# Patient Record
Sex: Male | Born: 1945 | Race: White | Hispanic: No | Marital: Married | Smoking: Never smoker
Health system: Southern US, Community
[De-identification: ages and names within clinical notes are randomized; demographics above are authoritative.]

## PROBLEM LIST (undated history)

## (undated) DIAGNOSIS — J069 Acute upper respiratory infection, unspecified: Secondary | ICD-10-CM

## (undated) DIAGNOSIS — C801 Malignant (primary) neoplasm, unspecified: Secondary | ICD-10-CM

## (undated) DIAGNOSIS — C61 Malignant neoplasm of prostate: Secondary | ICD-10-CM

## (undated) DIAGNOSIS — R251 Tremor, unspecified: Secondary | ICD-10-CM

## (undated) DIAGNOSIS — F419 Anxiety disorder, unspecified: Secondary | ICD-10-CM

## (undated) DIAGNOSIS — T7840XA Allergy, unspecified, initial encounter: Secondary | ICD-10-CM

## (undated) DIAGNOSIS — M1990 Unspecified osteoarthritis, unspecified site: Secondary | ICD-10-CM

## (undated) DIAGNOSIS — E785 Hyperlipidemia, unspecified: Secondary | ICD-10-CM

## (undated) DIAGNOSIS — I10 Essential (primary) hypertension: Secondary | ICD-10-CM

## (undated) DIAGNOSIS — G709 Myoneural disorder, unspecified: Secondary | ICD-10-CM

## (undated) HISTORY — DX: Tremor, unspecified: R25.1

## (undated) HISTORY — DX: Unspecified osteoarthritis, unspecified site: M19.90

## (undated) HISTORY — DX: Hyperlipidemia, unspecified: E78.5

## (undated) HISTORY — DX: Essential (primary) hypertension: I10

## (undated) HISTORY — DX: Myoneural disorder, unspecified: G70.9

## (undated) HISTORY — DX: Malignant (primary) neoplasm, unspecified: C80.1

## (undated) HISTORY — DX: Acute upper respiratory infection, unspecified: J06.9

## (undated) HISTORY — DX: Allergy, unspecified, initial encounter: T78.40XA

## (undated) HISTORY — DX: Anxiety disorder, unspecified: F41.9

## (undated) HISTORY — PX: CATARACT EXTRACTION: SUR2

---

## 2002-07-04 ENCOUNTER — Ambulatory Visit (HOSPITAL_COMMUNITY): Admission: RE | Admit: 2002-07-04 | Discharge: 2002-07-04 | Payer: Self-pay | Admitting: Gastroenterology

## 2002-11-17 ENCOUNTER — Ambulatory Visit (HOSPITAL_COMMUNITY): Admission: RE | Admit: 2002-11-17 | Discharge: 2002-11-17 | Payer: Self-pay | Admitting: Orthopedic Surgery

## 2002-11-17 ENCOUNTER — Ambulatory Visit (HOSPITAL_BASED_OUTPATIENT_CLINIC_OR_DEPARTMENT_OTHER): Admission: RE | Admit: 2002-11-17 | Discharge: 2002-11-17 | Payer: Self-pay | Admitting: Orthopedic Surgery

## 2003-01-30 ENCOUNTER — Encounter: Admission: RE | Admit: 2003-01-30 | Discharge: 2003-01-30 | Payer: Self-pay | Admitting: Family Medicine

## 2003-02-25 HISTORY — PX: OTHER SURGICAL HISTORY: SHX169

## 2005-03-04 ENCOUNTER — Encounter: Admission: RE | Admit: 2005-03-04 | Discharge: 2005-04-07 | Payer: Self-pay | Admitting: Family Medicine

## 2008-06-27 LAB — HM COLONOSCOPY

## 2010-03-16 ENCOUNTER — Encounter: Payer: Self-pay | Admitting: Family Medicine

## 2010-11-06 ENCOUNTER — Other Ambulatory Visit (HOSPITAL_COMMUNITY): Payer: Self-pay | Admitting: Urology

## 2010-11-06 DIAGNOSIS — C61 Malignant neoplasm of prostate: Secondary | ICD-10-CM

## 2010-11-18 ENCOUNTER — Encounter (HOSPITAL_COMMUNITY)
Admission: RE | Admit: 2010-11-18 | Discharge: 2010-11-18 | Disposition: A | Discharge status: Home / Self Care | Payer: Federal, State, Local not specified - PPO | Source: Ambulatory Visit | Attending: Urology | Admitting: Urology

## 2010-11-18 ENCOUNTER — Ambulatory Visit (HOSPITAL_COMMUNITY)
Admission: RE | Admit: 2010-11-18 | Discharge: 2010-11-18 | Disposition: A | Discharge status: Home / Self Care | Payer: Federal, State, Local not specified - PPO | Source: Ambulatory Visit | Attending: Urology | Admitting: Urology

## 2010-11-18 DIAGNOSIS — C61 Malignant neoplasm of prostate: Secondary | ICD-10-CM

## 2010-11-18 MED ORDER — TECHNETIUM TC 99M MEDRONATE IV KIT
25.0000 | PACK | Freq: Once | INTRAVENOUS | Status: AC | PRN
  Administered 2010-11-18: 25 via INTRAVENOUS

## 2010-11-28 ENCOUNTER — Other Ambulatory Visit (HOSPITAL_COMMUNITY): Payer: Self-pay | Admitting: Urology

## 2010-11-28 DIAGNOSIS — C61 Malignant neoplasm of prostate: Secondary | ICD-10-CM

## 2010-12-06 ENCOUNTER — Ambulatory Visit (HOSPITAL_COMMUNITY)
Admission: RE | Admit: 2010-12-06 | Discharge: 2010-12-06 | Disposition: A | Discharge status: Home / Self Care | Payer: Federal, State, Local not specified - PPO | Source: Ambulatory Visit | Attending: Urology | Admitting: Urology

## 2010-12-06 DIAGNOSIS — C61 Malignant neoplasm of prostate: Secondary | ICD-10-CM | POA: Insufficient documentation

## 2010-12-06 DIAGNOSIS — K4090 Unilateral inguinal hernia, without obstruction or gangrene, not specified as recurrent: Secondary | ICD-10-CM | POA: Insufficient documentation

## 2010-12-06 DIAGNOSIS — R972 Elevated prostate specific antigen [PSA]: Secondary | ICD-10-CM | POA: Insufficient documentation

## 2010-12-06 LAB — CREATININE, SERUM
Creatinine, Ser: 0.75 mg/dL (ref 0.50–1.35)
GFR calc Af Amer: >90 mL/min (ref >90)
GFR calc non Af Amer: >90 mL/min (ref >90)

## 2010-12-06 MED ORDER — GADOBENATE DIMEGLUMINE 529 MG/ML IV SOLN
20.0000 mL | Freq: Once | INTRAVENOUS | Status: AC | PRN
  Administered 2010-12-06: 16 mL via INTRAVENOUS

## 2011-01-01 ENCOUNTER — Encounter (HOSPITAL_COMMUNITY): Payer: Self-pay | Admitting: Pharmacy Technician

## 2011-01-06 ENCOUNTER — Ambulatory Visit (HOSPITAL_COMMUNITY)
Admission: RE | Admit: 2011-01-06 | Discharge: 2011-01-06 | Disposition: A | Discharge status: Home / Self Care | Payer: Federal, State, Local not specified - PPO | Source: Ambulatory Visit | Attending: Urology | Admitting: Urology

## 2011-01-06 ENCOUNTER — Other Ambulatory Visit: Payer: Self-pay | Admitting: Urology

## 2011-01-06 ENCOUNTER — Other Ambulatory Visit: Payer: Self-pay

## 2011-01-06 ENCOUNTER — Encounter (HOSPITAL_COMMUNITY): Payer: Federal, State, Local not specified - PPO

## 2011-01-06 ENCOUNTER — Encounter (HOSPITAL_COMMUNITY): Payer: Self-pay

## 2011-01-06 DIAGNOSIS — Z01812 Encounter for preprocedural laboratory examination: Secondary | ICD-10-CM | POA: Insufficient documentation

## 2011-01-06 DIAGNOSIS — Z01818 Encounter for other preprocedural examination: Secondary | ICD-10-CM | POA: Insufficient documentation

## 2011-01-06 LAB — BASIC METABOLIC PANEL
BUN: 13 mg/dL (ref 6–23)
CO2: 29 mEq/L (ref 19–32)
Calcium: 9.7 mg/dL (ref 8.4–10.5)
Chloride: 104 mEq/L (ref 96–112)
Creatinine, Ser: 0.91 mg/dL (ref 0.50–1.35)
GFR calc Af Amer: >90 mL/min (ref >90)
GFR calc non Af Amer: 88 mL/min — ABNORMAL LOW (ref >90)
Glucose, Bld: 104 mg/dL — ABNORMAL HIGH (ref 70–99)
Potassium: 3.9 mEq/L (ref 3.5–5.1)
Sodium: 139 mEq/L (ref 135–145)

## 2011-01-06 LAB — CBC
HCT: 42.7 % (ref 39.0–52.0)
Hemoglobin: 14.8 g/dL (ref 13.0–17.0)
MCH: 30.6 pg (ref 26.0–34.0)
MCHC: 34.7 g/dL (ref 30.0–36.0)
MCV: 88.2 fL (ref 78.0–100.0)
Platelets: 213 10*3/uL (ref 150–400)
RBC: 4.84 MIL/uL (ref 4.22–5.81)
RDW: 12.5 % (ref 11.5–15.5)
WBC: 9.3 10*3/uL (ref 4.0–10.5)

## 2011-01-06 LAB — SURGICAL PCR SCREEN
MRSA, PCR: INVALID — AB
Staphylococcus aureus: INVALID — AB

## 2011-01-06 NOTE — Patient Instructions (Addendum)
21 [name]  01/06/2011   Your procedure is scheduled on:  01/13/11 1610-9604  Report to [facility] at 0515 AM.  Call this number if you have problems the morning of surgery: [phone]   Remember: Mechanial Bowel Prep per office    Do not eat food:After Midnight.  Do not drink clear liquids: After Midnight.  Take these medicines the morning of surgery with A SIP OF WATER:  None   Do not wear jewelry.   Do not wear lotions, powders, or perfumes.     Do not bring valuables to the hospital.  Contacts, dentures or bridgework may not be worn into surgery.  Leave suitcase in the car. After surgery it may be brought to your room.  For patients admitted to the hospital, checkout time is 11:00 AM the day of discharge.        Special Instructions: CHG Shower Use Special Wash: 1/2 bottle night before surgery and 1/2 bottle morning of surgery. Wash face and private parts with regular soap . Use CHG chin to toes   Please read over the following fact sheets that you were given: MRSA Information, blood fact sheet, incentive spirometry fact sheet

## 2011-01-06 NOTE — Pre-Procedure Instructions (Signed)
01/06/11 Lab called with invalid MRSA result . Pt notified that result will not be back until 01/08/11 .  Pt aware to hold on to script of Mupirocin  And nurse would call pt on 01/08/11 pm.

## 2011-01-09 NOTE — Pre-Procedure Instructions (Signed)
01/09/11 negative pcr results per Marcelle Smiling in lab .

## 2011-01-12 MED ORDER — CEFAZOLIN SODIUM-DEXTROSE 2-3 GM-% IV SOLR
2.0000 g | Freq: Once | INTRAVENOUS | Status: AC
  Administered 2011-01-13: 1 g via INTRAVENOUS
  Filled 2011-01-12: qty 50

## 2011-01-12 MED ORDER — CEFAZOLIN SODIUM 1-5 GM-% IV SOLN
1.0000 g | Freq: Once | INTRAVENOUS | Status: DC
  Filled 2011-01-12: qty 50

## 2011-01-13 ENCOUNTER — Inpatient Hospital Stay (HOSPITAL_COMMUNITY): Payer: Federal, State, Local not specified - PPO | Admitting: Certified Registered Nurse Anesthetist

## 2011-01-13 ENCOUNTER — Encounter (HOSPITAL_COMMUNITY): Payer: Self-pay | Admitting: Certified Registered Nurse Anesthetist

## 2011-01-13 ENCOUNTER — Encounter (HOSPITAL_COMMUNITY)
Admission: RE | Disposition: A | Discharge status: Home / Self Care | Payer: Self-pay | Source: Ambulatory Visit | Attending: Urology

## 2011-01-13 ENCOUNTER — Inpatient Hospital Stay (HOSPITAL_COMMUNITY)
Admission: RE | Admit: 2011-01-13 | Discharge: 2011-01-14 | DRG: 335 | Disposition: A | Discharge status: Home / Self Care | Payer: Federal, State, Local not specified - PPO | Source: Ambulatory Visit | Attending: Urology | Admitting: Urology

## 2011-01-13 ENCOUNTER — Other Ambulatory Visit: Payer: Self-pay | Admitting: Urology

## 2011-01-13 ENCOUNTER — Encounter (HOSPITAL_COMMUNITY): Payer: Self-pay | Admitting: *Deleted

## 2011-01-13 DIAGNOSIS — E7800 Pure hypercholesterolemia, unspecified: Secondary | ICD-10-CM | POA: Diagnosis present

## 2011-01-13 DIAGNOSIS — I10 Essential (primary) hypertension: Secondary | ICD-10-CM | POA: Diagnosis present

## 2011-01-13 DIAGNOSIS — C61 Malignant neoplasm of prostate: Secondary | ICD-10-CM

## 2011-01-13 HISTORY — PX: ROBOT ASSISTED LAPAROSCOPIC RADICAL PROSTATECTOMY: SHX5141

## 2011-01-13 LAB — BASIC METABOLIC PANEL
BUN: 12 mg/dL (ref 6–23)
CO2: 27 mEq/L (ref 19–32)
Calcium: 9.2 mg/dL (ref 8.4–10.5)
Chloride: 102 mEq/L (ref 96–112)
Creatinine, Ser: 0.99 mg/dL (ref 0.50–1.35)
GFR calc Af Amer: >90 mL/min (ref >90)
GFR calc non Af Amer: 85 mL/min — ABNORMAL LOW (ref >90)
Glucose, Bld: 140 mg/dL — ABNORMAL HIGH (ref 70–99)
Potassium: 3.6 mEq/L (ref 3.5–5.1)
Sodium: 136 mEq/L (ref 135–145)

## 2011-01-13 LAB — HEMOGLOBIN AND HEMATOCRIT, BLOOD
HCT: 42.8 % (ref 39.0–52.0)
Hemoglobin: 14.5 g/dL (ref 13.0–17.0)

## 2011-01-13 LAB — TYPE AND SCREEN
ABO/RH(D): A POS
Antibody Screen: NEGATIVE

## 2011-01-13 SURGERY — ROBOTIC ASSISTED LAPAROSCOPIC RADICAL PROSTATECTOMY LEVEL 2
Anesthesia: General | Site: Abdomen | Laterality: Bilateral | Wound class: Clean Contaminated

## 2011-01-13 MED ORDER — BISACODYL 10 MG RE SUPP: 10.0000 mg | Freq: Every day | RECTAL | Status: DC | PRN

## 2011-01-13 MED ORDER — ONDANSETRON HCL 4 MG/2ML IJ SOLN: 4.0000 mg | INTRAMUSCULAR | Status: DC | PRN

## 2011-01-13 MED ORDER — ONDANSETRON HCL 4 MG/2ML IJ SOLN
INTRAMUSCULAR | Status: DC | PRN
  Administered 2011-01-13: 4 mg via INTRAVENOUS

## 2011-01-13 MED ORDER — ACETAMINOPHEN 10 MG/ML IV SOLN
INTRAVENOUS | Status: DC | PRN
  Administered 2011-01-13: 1000 mg via INTRAVENOUS

## 2011-01-13 MED ORDER — ACETAMINOPHEN 325 MG PO TABS: 650.0000 mg | ORAL_TABLET | ORAL | Status: DC | PRN

## 2011-01-13 MED ORDER — BENAZEPRIL HCL 20 MG PO TABS
20.0000 mg | ORAL_TABLET | Freq: Every day | ORAL | Status: DC
  Administered 2011-01-14: 20 mg via ORAL
  Filled 2011-01-13 (×2): qty 1

## 2011-01-13 MED ORDER — HEPARIN SODIUM (PORCINE) 1000 UNIT/ML IJ SOLN
INTRAMUSCULAR | Status: DC | PRN
  Administered 2011-01-13: 1000 [IU] via INTRAPERITONEAL

## 2011-01-13 MED ORDER — CEFAZOLIN SODIUM 1-5 GM-% IV SOLN
1.0000 g | Freq: Three times a day (TID) | INTRAVENOUS | Status: AC
  Administered 2011-01-13 (×2): 1 g via INTRAVENOUS
  Filled 2011-01-13 (×2): qty 50

## 2011-01-13 MED ORDER — LIDOCAINE HCL (CARDIAC) 20 MG/ML IV SOLN
INTRAVENOUS | Status: DC | PRN
  Administered 2011-01-13: 80 mg via INTRAVENOUS

## 2011-01-13 MED ORDER — SODIUM CHLORIDE 0.9 % IR SOLN
Status: DC | PRN
  Administered 2011-01-13: 1000 mL

## 2011-01-13 MED ORDER — HYDROCODONE-ACETAMINOPHEN 5-325 MG PO TABS
1.0000 | ORAL_TABLET | ORAL | Status: DC | PRN
  Filled 2011-01-13: qty 2

## 2011-01-13 MED ORDER — KETOROLAC TROMETHAMINE 30 MG/ML IJ SOLN
INTRAMUSCULAR | Status: AC
  Filled 2011-01-13: qty 1

## 2011-01-13 MED ORDER — BUPIVACAINE-EPINEPHRINE 0.25% -1:200000 IJ SOLN
INTRAMUSCULAR | Status: DC | PRN
  Administered 2011-01-13: 28 mL

## 2011-01-13 MED ORDER — BELLADONNA ALKALOIDS-OPIUM 16.2-60 MG RE SUPP: 1.0000 | Freq: Four times a day (QID) | RECTAL | Status: DC | PRN

## 2011-01-13 MED ORDER — HYDROMORPHONE HCL PF 1 MG/ML IJ SOLN
0.2500 mg | INTRAMUSCULAR | Status: DC | PRN
  Administered 2011-01-13 (×4): 0.25 mg via INTRAVENOUS

## 2011-01-13 MED ORDER — DEXTROSE-NACL 5-0.45 % IV SOLN
INTRAVENOUS | Status: DC
  Administered 2011-01-13 – 2011-01-14 (×3): via INTRAVENOUS

## 2011-01-13 MED ORDER — HYDROCODONE-ACETAMINOPHEN 5-325 MG PO TABS: 1.0000 | ORAL_TABLET | ORAL | Status: AC | PRN

## 2011-01-13 MED ORDER — PROPOFOL 10 MG/ML IV EMUL
INTRAVENOUS | Status: DC | PRN
  Administered 2011-01-13: 200 mg via INTRAVENOUS

## 2011-01-13 MED ORDER — ROCURONIUM BROMIDE 100 MG/10ML IV SOLN
INTRAVENOUS | Status: DC | PRN
  Administered 2011-01-13: 10 mg via INTRAVENOUS
  Administered 2011-01-13: 50 mg via INTRAVENOUS
  Administered 2011-01-13: 10 mg via INTRAVENOUS
  Administered 2011-01-13: 5 mg via INTRAVENOUS
  Administered 2011-01-13: 10 mg via INTRAVENOUS

## 2011-01-13 MED ORDER — MENTHOL 3 MG MT LOZG: 1.0000 | LOZENGE | OROMUCOSAL | Status: DC | PRN

## 2011-01-13 MED ORDER — CIPROFLOXACIN HCL 500 MG PO TABS: 500.0000 mg | ORAL_TABLET | Freq: Two times a day (BID) | ORAL | Status: AC

## 2011-01-13 MED ORDER — HYDROCODONE-ACETAMINOPHEN 5-325 MG PO TABS: 1.0000 | ORAL_TABLET | Freq: Four times a day (QID) | ORAL | Status: DC | PRN

## 2011-01-13 MED ORDER — LACTATED RINGERS IV SOLN
INTRAVENOUS | Status: DC | PRN
  Administered 2011-01-13 (×2): via INTRAVENOUS

## 2011-01-13 MED ORDER — NEOSTIGMINE METHYLSULFATE 1 MG/ML IJ SOLN
INTRAMUSCULAR | Status: DC | PRN
  Administered 2011-01-13: 5 mg via INTRAVENOUS

## 2011-01-13 MED ORDER — SODIUM CHLORIDE 0.9 % IV BOLUS (SEPSIS)
1000.0000 mL | Freq: Once | INTRAVENOUS | Status: AC
  Administered 2011-01-13: 1000 mL via INTRAVENOUS

## 2011-01-13 MED ORDER — ZOLPIDEM TARTRATE 5 MG PO TABS
5.0000 mg | ORAL_TABLET | Freq: Every evening | ORAL | Status: DC | PRN
  Administered 2011-01-13: 5 mg via ORAL
  Filled 2011-01-13: qty 1

## 2011-01-13 MED ORDER — KETOROLAC TROMETHAMINE 30 MG/ML IJ SOLN
30.0000 mg | Freq: Four times a day (QID) | INTRAMUSCULAR | Status: DC
  Administered 2011-01-13 – 2011-01-14 (×5): 30 mg via INTRAVENOUS
  Filled 2011-01-13 (×5): qty 1

## 2011-01-13 MED ORDER — MIDAZOLAM HCL 5 MG/5ML IJ SOLN
INTRAMUSCULAR | Status: DC | PRN
  Administered 2011-01-13: 2 mg via INTRAVENOUS

## 2011-01-13 MED ORDER — BENAZEPRIL-HYDROCHLOROTHIAZIDE 20-12.5 MG PO TABS: 1.0000 | ORAL_TABLET | ORAL | Status: DC

## 2011-01-13 MED ORDER — HYDROCHLOROTHIAZIDE 12.5 MG PO CAPS
12.5000 mg | ORAL_CAPSULE | Freq: Every day | ORAL | Status: DC
  Administered 2011-01-14: 12.5 mg via ORAL
  Filled 2011-01-13 (×2): qty 1

## 2011-01-13 MED ORDER — PHENOL 1.4 % MT LIQD: 1.0000 | OROMUCOSAL | Status: DC | PRN

## 2011-01-13 MED ORDER — LABETALOL HCL 5 MG/ML IV SOLN
INTRAVENOUS | Status: DC | PRN
  Administered 2011-01-13: 5 mg via INTRAVENOUS

## 2011-01-13 MED ORDER — INDIGOTINDISULFONATE SODIUM 8 MG/ML IJ SOLN
INTRAMUSCULAR | Status: DC | PRN
  Administered 2011-01-13: 5 mL via INTRAVENOUS

## 2011-01-13 MED ORDER — MORPHINE SULFATE 2 MG/ML IJ SOLN
2.0000 mg | INTRAMUSCULAR | Status: DC | PRN
  Administered 2011-01-13 – 2011-01-14 (×4): 4 mg via INTRAVENOUS
  Filled 2011-01-13 (×4): qty 2

## 2011-01-13 MED ORDER — DEXAMETHASONE SODIUM PHOSPHATE 10 MG/ML IJ SOLN
INTRAMUSCULAR | Status: DC | PRN
  Administered 2011-01-13: 10 mg via INTRAVENOUS

## 2011-01-13 MED ORDER — GLYCOPYRROLATE 0.2 MG/ML IJ SOLN
INTRAMUSCULAR | Status: DC | PRN
  Administered 2011-01-13: .8 mg via INTRAVENOUS

## 2011-01-13 MED ORDER — FENTANYL CITRATE 0.05 MG/ML IJ SOLN
INTRAMUSCULAR | Status: DC | PRN
  Administered 2011-01-13 (×6): 50 ug via INTRAVENOUS
  Administered 2011-01-13: 100 ug via INTRAVENOUS
  Administered 2011-01-13 (×2): 50 ug via INTRAVENOUS

## 2011-01-13 MED ORDER — SIMVASTATIN 20 MG PO TABS
20.0000 mg | ORAL_TABLET | Freq: Every day | ORAL | Status: DC
  Administered 2011-01-13: 20 mg via ORAL
  Filled 2011-01-13 (×2): qty 1

## 2011-01-13 MED ORDER — HYDRALAZINE HCL 20 MG/ML IJ SOLN
INTRAMUSCULAR | Status: DC | PRN
  Administered 2011-01-13: 4 mg via INTRAVENOUS
  Administered 2011-01-13: 2 mg via INTRAVENOUS

## 2011-01-13 MED ORDER — HYDROMORPHONE HCL PF 1 MG/ML IJ SOLN
INTRAMUSCULAR | Status: AC
  Filled 2011-01-13: qty 1

## 2011-01-13 MED ORDER — STERILE WATER FOR IRRIGATION IR SOLN
Status: DC | PRN
  Administered 2011-01-13: 1500 mL

## 2011-01-13 SURGICAL SUPPLY — 38 items
CANISTER SUCTION 2500CC (MISCELLANEOUS) ×2 IMPLANT
CANNULA SEAL DVNC (CANNULA) ×1 IMPLANT
CANNULA SEALS DA VINCI (CANNULA) ×1
CATH ROBINSON RED A/P 8FR (CATHETERS) ×2 IMPLANT
CHLORAPREP W/TINT 26ML (MISCELLANEOUS) ×2 IMPLANT
CLIP LIGATING HEM O LOK PURPLE (MISCELLANEOUS) ×4 IMPLANT
CLOTH BEACON ORANGE TIMEOUT ST (SAFETY) ×2 IMPLANT
COVER SURGICAL LIGHT HANDLE (MISCELLANEOUS) ×2 IMPLANT
COVER TIP SHEARS 8 DVNC (MISCELLANEOUS) ×1 IMPLANT
COVER TIP SHEARS 8MM DA VINCI (MISCELLANEOUS) ×1
CUTTER LINEAR FLEX ECHELON 45 (STAPLE) ×2 IMPLANT
DECANTER SPIKE VIAL GLASS SM (MISCELLANEOUS) ×1 IMPLANT
DRAPE SURG IRRIG POUCH 19X23 (DRAPES) ×2 IMPLANT
DRAPE UTILITY XL STRL (DRAPES) ×2 IMPLANT
DRSG TEGADERM 6X8 (GAUZE/BANDAGES/DRESSINGS) ×4 IMPLANT
ELECT REM PT RETURN 9FT ADLT (ELECTROSURGICAL) ×2
ELECTRODE REM PT RTRN 9FT ADLT (ELECTROSURGICAL) ×1 IMPLANT
GAUZE SPONGE 2X2 8PLY STRL LF (GAUZE/BANDAGES/DRESSINGS) IMPLANT
GLOVE BIOGEL M STRL SZ7.5 (GLOVE) ×8 IMPLANT
GOWN STRL NON-REIN LRG LVL3 (GOWN DISPOSABLE) ×8 IMPLANT
HOLDER FOLEY CATH W/STRAP (MISCELLANEOUS) ×2 IMPLANT
IV LACTATED RINGERS 1000ML (IV SOLUTION) ×2 IMPLANT
KIT ACCESSORY DA VINCI DISP (KITS) ×1
KIT ACCESSORY DVNC DISP (KITS) ×1 IMPLANT
NDL SAFETY ECLIPSE 18X1.5 (NEEDLE) ×1 IMPLANT
NEEDLE HYPO 18GX1.5 SHARP (NEEDLE) ×2
PACK ROBOT UROLOGY CUSTOM (CUSTOM PROCEDURE TRAY) ×2 IMPLANT
POSITIONER SURGICAL ARM (MISCELLANEOUS) ×2 IMPLANT
RELOAD GREEN ECHELON 45 (STAPLE) ×2 IMPLANT
SET TUBE IRRIG SUCTION NO TIP (IRRIGATION / IRRIGATOR) ×2 IMPLANT
SOLUTION ELECTROLUBE (MISCELLANEOUS) ×2 IMPLANT
SPONGE GAUZE 2X2 STER 10/PKG (GAUZE/BANDAGES/DRESSINGS) ×1
SPONGE GAUZE 4X4 12PLY (GAUZE/BANDAGES/DRESSINGS) ×2 IMPLANT
SUT VICRYL 0 UR6 27IN ABS (SUTURE) ×4 IMPLANT
SYR 27GX1/2 1ML LL SAFETY (SYRINGE) ×2 IMPLANT
TAPE CLOTH SOFT 2X10 (GAUZE/BANDAGES/DRESSINGS) ×1 IMPLANT
TOWEL OR NON WOVEN STRL DISP B (DISPOSABLE) ×2 IMPLANT
WATER STERILE IRR 1500ML POUR (IV SOLUTION) ×4 IMPLANT

## 2011-01-13 NOTE — Progress Notes (Signed)
Day of Surgery Subjective: Patient reports incisional pain and tolerating PO liquids.  He is ready to ambulate.  Objective: Vital signs in last 24 hours: Temp:  [97.6 F (36.4 C)-98.5 F (36.9 C)] 97.8 F (36.6 C) (11/19 1655) Pulse Rate:  [68-97] 84  (11/19 1655) Resp:  [9-20] 14  (11/19 1655) BP: (108-188)/(59-98) 108/59 mmHg (11/19 1655) SpO2:  [95 %-100 %] 96 % (11/19 1655)    Intake/Output this shift: Total I/O In: 3300 [I.V.:2300; IV Piggyback:1000] Out: 305 [Urine:175; Drains:80; Blood:50]  Physical Exam:  General:alert, cooperative and no distress GI: soft; tender Dressings C/D/I Extremities: ted hose and SCDs in place  Lab Results:  Basename 01/13/11 1057  HGB 14.5  HCT 42.8   BMET  Basename 01/13/11 1057  NA 136  K 3.6  CL 102  CO2 27  GLUCOSE 140*  BUN 12  CREATININE 0.99  CALCIUM 9.2   Assessment/Plan: POST OP RALP Pt is doing well.  Cont routine post op care.   LOS: 0 days   Silas Flood. 01/13/2011, 5:23 PM

## 2011-01-13 NOTE — Anesthesia Postprocedure Evaluation (Signed)
  Anesthesia Post-op Note  Patient: MONTFORD BARG  Procedure(s) Performed:  ROBOTIC ASSISTED LAPAROSCOPIC RADICAL PROSTATECTOMY LEVEL 2 - Robotic Assisted Laparoscopic Prostatetectomy with Bilateral Lymphadenectomy  Level 2  Patient Location: PACU  Anesthesia Type: General  Level of Consciousness: awake and alert   Airway and Oxygen Therapy: Patient Spontanous Breathing  Post-op Pain: mild  Post-op Assessment: Post-op Vital signs reviewed, Patient's Cardiovascular Status Stable, Respiratory Function Stable, Patent Airway and No signs of Nausea or vomiting  Post-op Vital Signs: stable  Complications: No apparent anesthesia complications

## 2011-01-13 NOTE — Anesthesia Preprocedure Evaluation (Addendum)
Anesthesia Evaluation  Patient identified by MRN, date of birth, ID band Patient awake    Reviewed: Allergy & Precautions, H&P , NPO status , Patient's Chart, lab work & pertinent test results  Airway Mallampati: III TM Distance: <3 FB Neck ROM: Full    Dental  (+) Teeth Intact   Pulmonary neg pulmonary ROS,    Pulmonary exam normal       Cardiovascular hypertension, neg cardio ROS     Neuro/Psych Negative Neurological ROS  Negative Psych ROS   GI/Hepatic negative GI ROS, Neg liver ROS,   Endo/Other  Negative Endocrine ROS  Renal/GU negative Renal ROS  Genitourinary negative   Musculoskeletal negative musculoskeletal ROS (+)   Abdominal   Peds negative pediatric ROS (+)  Hematology negative hematology ROS (+)   Anesthesia Other Findings   Reproductive/Obstetrics negative OB ROS                          Anesthesia Physical Anesthesia Plan  ASA: II  Anesthesia Plan: General   Post-op Pain Management:    Induction: Intravenous  Airway Management Planned: Oral ETT  Additional Equipment:   Intra-op Plan:   Post-operative Plan: Extubation in OR  Informed Consent: I have reviewed the patients History and Physical, chart, labs and discussed the procedure including the risks, benefits and alternatives for the proposed anesthesia with the patient or authorized representative who has indicated his/her understanding and acceptance.   Dental advisory given  Plan Discussed with:   Anesthesia Plan Comments:         Anesthesia Quick Evaluation

## 2011-01-13 NOTE — H&P (Signed)
Chief Complaint  Prostate Cancer     History of Present Illness  [name] is a 65 year old who was found to have an increasing PSA up to 4.38 which prompted a referral to Dr. Mena Goes for evaluation.  He has no family history of prostate cancer and was noted to have a normal DRE.  He underwent a prostate biopsy on 10/30/10 which demostrated Gleason 4+5=9 adenocarcinoma of the prostate with 4 out of 12 biopsy cores to be positive for malignancy. Staging studies have been performed including a bone scan and CT of the abdomen/pelvis both on 11/18/10 which were negative for metastatic disease.  He also had an MRI of the prostate performed on 12/06/10 which demonstrated no evidence for EPE, SVI, or LAD. [name] is very well informed and has been counseled by Dr. Mena Goes, Dr. Annabell Howells, and Dr. Shelby Dubin.  TNM stage: cT1c N0 M0 PSA: 4.38 Gleason score: 4+5=9 Biopsy (10/30/10): 4/12 cores -- R apex (30%, 4+5=9), R lateral apex (20%, 4+5=9), R mid (<5%, 3+3=6), R lateral mid (20%, 3+3=6) Prostate volume: 20 cc  Nomogram: OC disease: 67% EPE: 30% SVI: 6% LNI: 7.3% PFS (surgery): 89%, 84%  Urinary function: IPSS - 2. Erectile function: SHIM score - 11.  He has used PDE 5 inhibitors although without significant success.   Past Medical History Problems  1. History of  Arthritis V13.4 2. History of  Esophageal Reflux 530.81 3. History of  Hypercholesterolemia 272.0 4. History of  Hypertension 401.9 5. History of  Prostate Hard Area Or Nodule 600.10  Surgical History  None   Current Meds 1. Benazepril-Hydrochlorothiazide 20-12.5 MG Oral Tablet; Therapy: 10Sep2012 to 2. Simvastatin TABS; Therapy: (Recorded:09Aug2012) to  Allergies Medication  1. No Known Drug Allergies  Family History Problems  1. Maternal history of  Cervical Cancer 2. Family history of  Death In The Family Father age 23 of heart problems 3. Family history of  Death In The Family Mother age 33 of cancer 4. Family  history of  Family Health Status Number Of Children 1 son  1 daughter 5. Paternal history of  Heart Disease V17.49 6. Maternal history of  Lung Cancer V16.1  Social History Problems    Alcohol Use 1-2 a day   Marital History - Currently Married   Occupation: Research scientist (medical) Denied    History of  Tobacco Use 305.1  Review of Systems Constitutional, skin, eye, otolaryngeal, hematologic/lymphatic, cardiovascular, pulmonary, endocrine, musculoskeletal, gastrointestinal, neurological and psychiatric system(s) were reviewed and pertinent findings if present are noted.    Vitals  BMI Calculated: 24.34 BSA Calculated: 1.95 Height: 5 ft 10 in Weight: 170 lb  Blood Pressure: 156 / 65 Heart Rate: 69  Physical Exam Constitutional: Well nourished and well developed . No acute distress.  ENT:. The ears and nose are normal in appearance.  Neck: The appearance of the neck is normal and no neck mass is present.  Pulmonary: No respiratory distress, normal respiratory rhythm and effort and clear bilateral breath sounds.  Cardiovascular: Heart rate and rhythm are normal . No peripheral edema.  Abdomen: The abdomen is soft and nontender. No masses are palpated.  Rectal: Prostate size is estimated to be 40 g. The prostate has no nodularity and is not indurated.  Lymphatics: The femoral and inguinal nodes are not enlarged or tender.  Skin: Normal skin turgor, no visible rash and no visible skin lesions.  Neuro/Psych:. Mood and affect are appropriate.    Results/Data Urine [Data Includes: Last 1  Day]  09Nov2012  COLOR: YELLOW  Reference Range YELLOW APPEARANCE: CLEAR  Reference Range CLEAR SPECIFIC GRAVITY: 1.020  Reference Range 1.005-1.030 pH: 5.0  Reference Range 5.0-8.0 GLUCOSE: NEG mg/dL Reference Range NEG BILIRUBIN: NEG  Reference Range NEG KETONE: NEG mg/dL Reference Range NEG BLOOD: NEG  Reference Range NEG PROTEIN: NEG mg/dL Reference Range NEG UROBILINOGEN: 0.2 mg/dL Reference  Range 1.6-1.0 NITRITE: NEG  Reference Range NEG LEUKOCYTE ESTERASE: NEG  Reference Range NEG   I reviewed his medical records, PSA results, pathology report, and independently reviewed his imaging studies with findings as dictated above.   Assessment Assessed  1. Prostate Cancer 185   Discussion/Summary  1. Prostate cancer: [name] does wish to proceed with surgical therapy.   The patient was counseled about the natural history of prostate cancer and the standard treatment options that are available for prostate cancer. It was explained to him how his age and life expectancy, clinical stage, Gleason score, and PSA affect his prognosis, the decision to proceed with additional staging studies, as well as how that information influences recommended treatment strategies. We discussed the roles for active surveillance, radiation therapy, surgical therapy, androgen deprivation, as well as ablative therapy options for the treatment of prostate cancer as appropriate to his individual cancer situation. We discussed the risks and benefits of these options with regard to their impact on cancer control and also in terms of potential adverse events, complications, and impact on quiality of life particularly related to urinary, bowel, and sexual function. The patient was encouraged to ask questions throughout the discussion today and all questions were answered to his stated satisfaction. In addition, the patient was provided with and/or directed to appropriate resources and literature for further education about prostate cancer and treatment options.   We discussed surgical therapy for prostate cancer including the different available surgical approaches. We discussed, in detail, the risks and expectations of surgery with regard to cancer control, urinary control, and erectile function as well as the expected postoperative recovery process. Additional risks of surgery including but not limited to bleeding,  infection, hernia formation, nerve damage, lymphocele formation, bowel/rectal injury potentially necessitating colostomy, damage to the urinary tract resulting in urine leakage, urethral stricture, and the cardiopulmonary risks such as myocardial infarction, stroke, death, venothromboembolism, etc. were explained. The risk of open surgical conversion for robotic/laparoscopic prostatectomy was also discussed.     Considering his poor preoperative erectile function and the fact he has been nonresponsive to PDE 5 inhibitors, we discussed her surgical approach and will proceed with a non-nerve sparing radical prostatectomy and bilateral pelvic lymphadenectomy in the setting of high risk disease.

## 2011-01-13 NOTE — Transfer of Care (Signed)
Immediate Anesthesia Transfer of Care Note  Patient: [name]  Procedure(s) Performed:  ROBOTIC ASSISTED LAPAROSCOPIC RADICAL PROSTATECTOMY LEVEL 2 - Robotic Assisted Laparoscopic Prostatetectomy with Bilateral Lymphadenectomy  Level 2  Patient Location: PACU  Anesthesia Type: General  Level of Consciousness: sedated, patient cooperative and responds to stimulaton  Airway & Oxygen Therapy: Patient Spontanous Breathing and Patient connected to face mask oxgen  Post-op Assessment: Report given to PACU RN and Post -op Vital signs reviewed and stable  Post vital signs: Reviewed and stable  Complications: No apparent anesthesia complications

## 2011-01-13 NOTE — Anesthesia Procedure Notes (Signed)
Performed by: Carter Kassel JOHNSON     

## 2011-01-13 NOTE — Op Note (Signed)
Preoperative diagnosis: Clinically localized adenocarcinoma of the prostate (clinical stage T1c N0 M0)  Postoperative diagnosis: Clinically localized adenocarcinoma of the prostate (clinical stage T1c N0 M0)  Procedure:  1. Robotic assisted laparoscopic radical prostatectomy (non nerve sparing) 2. Bilateral robotic assisted laparoscopic pelvic lymphadenectomy  Surgeon: Moody Bruins. M.D.  Assistant(s): Dr. Natalia Leatherwood and Pecola Leisure, [place]  Anesthesia: General  Complications: None  EBL: 50 mL  IVF:  1500 mL crystalloid  Specimens: 1. Prostate and seminal vesicles 2. Right pelvic lymph nodes 3. Left pelvic lymph nodes  Disposition of specimens: Pathology  Drains: 1. 20 Fr coude catheter 2. # 19 Blake pelvic drain  Indication: [name] is a 65 y.o. year old patient with clinically localized prostate cancer.  After a thorough review of the management options for treatment of prostate cancer, he elected to proceed with surgical therapy and the above procedure(s).  We have discussed the potential benefits and risks of the procedure, side effects of the proposed treatment, the likelihood of the patient achieving the goals of the procedure, and any potential problems that might occur during the procedure or recuperation. Informed consent has been obtained.  Description of procedure:  The patient was taken to the operating room and a general anesthetic was administered. He was given preoperative antibiotics, placed in the dorsal lithotomy position, and prepped and draped in the usual sterile fashion. Next a preoperative timeout was performed. A urethral catheter was placed into the bladder and a site was selected near the umbilicus for placement of the camera port. This was placed using a standard open Hassan technique which allowed entry into the peritoneal cavity under direct vision and without difficulty. A 12 mm port was placed and a pneumoperitoneum established.  The camera was then used to inspect the abdomen and there was no evidence of any intra-abdominal injuries or other abnormalities. The remaining abdominal ports were then placed. 8 mm robotic ports were placed in the right lower quadrant, left lower quadrant, and far left lateral abdominal wall. A 5 mm port was placed in the right upper quadrant and a 12 mm port was placed in the right lateral abdominal wall for laparoscopic assistance. All ports were placed under direct vision without difficulty. The surgical cart was then docked.   Utilizing the cautery scissors, the bladder was reflected posteriorly allowing entry into the space of Retzius and identification of the endopelvic fascia and prostate. The periprostatic fat was then removed from the prostate allowing full exposure of the endopelvic fascia. The endopelvic fascia was then incised from the apex back to the base of the prostate bilaterally and the underlying levator muscle fibers were swept laterally off the prostate thereby isolating the dorsal venous complex. The dorsal vein was then stapled and divided with a 45 mm Flex Echelon stapler. Attention then turned to the bladder neck which was divided anteriorly thereby allowing entry into the bladder and exposure of the urethral catheter. The catheter balloon was deflated and the catheter was brought into the operative field and used to retract the prostate anteriorly. The posterior bladder neck was then examined and was divided allowing further dissection between the bladder and prostate posteriorly until the vasa deferentia and seminal vessels were identified. The vasa deferentia were isolated, divided, and lifted anteriorly. The seminal vesicles were dissected down to their tips with care to control the seminal vascular arterial blood supply. These structures were then lifted anteriorly and the space between Denonvillier's fascia and the anterior rectum was  developed with a combination of sharp and blunt  dissection. This isolated the vascular pedicles of the prostate.  A wide non nerve sparing dissection was performed with Weck clips used to ligate the vascular pedicles of the prostate bilaterally. The vascular pedicles of the prostate were then divided.  The urethra was then sharply transected allowing the prostate specimen to be disarticulated. The pelvis was copiously irrigated and hemostasis was ensured. There was no evidence for rectal injury.  Attention then turned to the right pelvic sidewall. The fibrofatty tissue between the external iliac vein, confluence of the iliac vessels, hypogastric artery, and Cooper's ligament was dissected free from the pelvic sidewall with care to preserve the obturator nerve. Weck clips were used for lymphostasis and hemostasis. An identical procedure was performed on the contralateral side and the lymphatic packets were removed for permanent pathologic analysis.  Attention then turned to the urethral anastomosis. A 2-0 Vicryl slip knot was placed between Denonvillier's fascia, the posterior bladder neck, and the posterior urethra to reapproximate these structures. A double-armed 3-0 Monocryl suture was then used to perform a 360 running tension-free anastomosis between the bladder neck and urethra. A new urethral catheter was then placed into the bladder and irrigated. There were no blood clots within the bladder and the anastomosis appeared to be watertight. A #19 Blake drain was then brought through the left lateral 8 mm port site and positioned appropriately within the pelvis. It was secured to the skin with a nylon suture. The surgical cart was then undocked. The right lateral 12 mm port site was closed at the fascial level with a 0 Vicryl suture placed laparoscopically. All remaining ports were then removed under direct vision. The prostate specimen was removed intact within the Endopouch retrieval bag via the periumbilical camera port site. This fascial opening  was closed with two running 0 Vicryl sutures. 0.25% Marcaine was then injected into all port sites and all incisions were reapproximated at the skin level with staples. Sterile dressings were applied. The patient appeared to tolerate the procedure well and without complications. The patient was able to be extubated and transferred to the recovery unit in satisfactory condition.  Moody Bruins MD

## 2011-01-13 NOTE — Progress Notes (Signed)
Bowel prep completed as directed yesterday 01/12/11

## 2011-01-14 LAB — BASIC METABOLIC PANEL
BUN: 15 mg/dL (ref 6–23)
CO2: 26 mEq/L (ref 19–32)
Calcium: 8.6 mg/dL (ref 8.4–10.5)
Chloride: 101 mEq/L (ref 96–112)
Creatinine, Ser: 0.86 mg/dL (ref 0.50–1.35)
GFR calc Af Amer: >90 mL/min (ref >90)
GFR calc non Af Amer: 90 mL/min — ABNORMAL LOW (ref >90)
Glucose, Bld: 141 mg/dL — ABNORMAL HIGH (ref 70–99)
Potassium: 4.1 mEq/L (ref 3.5–5.1)
Sodium: 133 mEq/L — ABNORMAL LOW (ref 135–145)

## 2011-01-14 LAB — HEMOGLOBIN AND HEMATOCRIT, BLOOD
HCT: 36.5 % — ABNORMAL LOW (ref 39.0–52.0)
Hemoglobin: 12.6 g/dL — ABNORMAL LOW (ref 13.0–17.0)

## 2011-01-14 MED ORDER — DOCUSATE SODIUM 100 MG PO CAPS: 100.0000 mg | ORAL_CAPSULE | Freq: Two times a day (BID) | ORAL | Status: AC

## 2011-01-14 MED ORDER — HYDROCODONE-ACETAMINOPHEN 5-325 MG PO TABS
1.0000 | ORAL_TABLET | Freq: Four times a day (QID) | ORAL | Status: DC | PRN
  Administered 2011-01-14 (×2): 1 via ORAL
  Filled 2011-01-14 (×2): qty 1

## 2011-01-14 MED ORDER — BISACODYL 10 MG RE SUPP
10.0000 mg | Freq: Once | RECTAL | Status: AC
  Administered 2011-01-14: 10 mg via RECTAL
  Filled 2011-01-14: qty 1

## 2011-01-14 NOTE — Progress Notes (Signed)
Foley/leg bag teaching done and wife demonstrated ability to switch between bags Verbalizes understanding of d/c instructions, prescriptions given  Stable on discharge

## 2011-01-14 NOTE — Progress Notes (Signed)
Patient ID: [name], male   DOB: 06/22/1945, 65 y.o.   MRN: [id number] 1 Day Post-Op Subjective: The patient is doing well.  No nausea or vomiting. Pain is adequately controlled.  Objective: Vital signs in last 24 hours: Temp:  [97.6 F (36.4 C)-99 F (37.2 C)] 98 F (36.7 C) (11/20 0600) Pulse Rate:  [62-97] 62  (11/20 0600) Resp:  [9-20] 20  (11/20 0600) BP: (106-188)/(59-98) 132/66 mmHg (11/20 0600) SpO2:  [93 %-100 %] 99 % (11/20 0600) Weight:  [82.509 kg (181 lb 14.4 oz)] 181 lb 14.4 oz (82.509 kg) (11/20 0600)  Intake/Output from previous day: 11/19 0701 - 11/20 0700 In: 3420 [P.O.:120; I.V.:2300; IV Piggyback:1000] Out: 1105 [Urine:535; Emesis/NG output:250; Drains:270; Blood:50]  Urine output not completely recorded.  Also about 500cc of urine not recorded yet. Intake/Output this shift:    Physical Exam:  General: Alert and oriented. CV: RRR Lungs: Clear bilaterally. GI: Soft, Nondistended. Incisions: Dressings intact. Urine: Clear Extremities: Nontender, no erythema, no edema.  Lab Results:  Basename 01/14/11 0517 01/13/11 1057  HGB 12.6* 14.5  HCT 36.5* 42.8          Basename 01/14/11 0517 01/13/11 1057  CREATININE 0.86 0.99           Results for orders placed during the hospital encounter of 01/13/11 (from the past 24 hour(s))  BASIC METABOLIC PANEL     Status: Abnormal   Collection Time   01/13/11 10:57 AM      Component Value Range   Sodium 136  135 - 145 (mEq/L)   Potassium 3.6  3.5 - 5.1 (mEq/L)   Chloride 102  96 - 112 (mEq/L)   CO2 27  19 - 32 (mEq/L)   Glucose, Bld 140 (*) 70 - 99 (mg/dL)   BUN 12  6 - 23 (mg/dL)   Creatinine, Ser 4.09  0.50 - 1.35 (mg/dL)   Calcium 9.2  8.4 - 81.1 (mg/dL)   GFR calc non Af Amer 85 (*) >90 (mL/min)   GFR calc Af Amer >90  >90 (mL/min)  HEMOGLOBIN AND HEMATOCRIT, BLOOD     Status: Normal   Collection Time   01/13/11 10:57 AM      Component Value Range   Hemoglobin 14.5  13.0 - 17.0 (g/dL)   HCT  91.4  78.2 - 95.6 (%)  BASIC METABOLIC PANEL     Status: Abnormal   Collection Time   01/14/11  5:17 AM      Component Value Range   Sodium 133 (*) 135 - 145 (mEq/L)   Potassium 4.1  3.5 - 5.1 (mEq/L)   Chloride 101  96 - 112 (mEq/L)   CO2 26  19 - 32 (mEq/L)   Glucose, Bld 141 (*) 70 - 99 (mg/dL)   BUN 15  6 - 23 (mg/dL)   Creatinine, Ser 2.13  0.50 - 1.35 (mg/dL)   Calcium 8.6  8.4 - 08.6 (mg/dL)   GFR calc non Af Amer 90 (*) >90 (mL/min)   GFR calc Af Amer >90  >90 (mL/min)  HEMOGLOBIN AND HEMATOCRIT, BLOOD     Status: Abnormal   Collection Time   01/14/11  5:17 AM      Component Value Range   Hemoglobin 12.6 (*) 13.0 - 17.0 (g/dL)   HCT 57.8 (*) 46.9 - 52.0 (%)    Assessment/Plan: POD# 1 s/p robotic prostatectomy.  1) SL IVF 2) Ambulate, Incentive spirometry 3) Transition to oral pain medication 4) Dulcolax suppository 5) D/C  pelvic drain 6) Plan for likely discharge later today   [name]. MD   LOS: 1 day   [name],LES 01/14/2011, 7:05 AM

## 2011-01-14 NOTE — Discharge Summary (Signed)
  Date of admission: 01/13/2011  Date of discharge: 01/14/2011  Admission diagnosis: Prostate Cancer  Discharge diagnosis: Prostate Cancer  History and Physical: For full details, please see admission history and physical. Briefly, [name] is a 65 y.o. gentleman with localized prostate cancer.  After discussing management/treatment options, he elected to proceed with surgical treatment.  Hospital Course: [name] was taken to the operating room on 01/13/2011 and underwent a robotic assisted laparoscopic radical prostatectomy. He tolerated this procedure well and without complications. Postoperatively, he was able to be transferred to a regular hospital room following recovery from anesthesia.  He was able to begin ambulating the night of surgery. He remained hemodynamically stable overnight.  He had excellent urine output with appropriately minimal output from his pelvic drain and his pelvic drain was removed on POD #1.  He was transitioned to oral pain medication, tolerated a clear liquid diet, and had met all discharge criteria and was able to be discharged home later on POD#1.  Laboratory values:  Basename 01/14/11 0517 01/13/11 1057  HGB 12.6* 14.5  HCT 36.5* 42.8   Lab Results  Component Value Date   CREATININE 0.86 01/14/2011   Disposition: Home  Discharge instruction: He was instructed to be ambulatory but to refrain from heavy lifting, strenuous activity, or driving. He was instructed on urethral catheter care.  Staples will be removed at first f/u appt.  Discharge medications:   Medication List  As of 01/14/2011  9:00 AM   START taking these medications         ciprofloxacin 500 MG tablet   Commonly known as: CIPRO   Take 1 tablet (500 mg total) by mouth 2 (two) times daily. Start day prior to office visit for foley removal      HYDROcodone-acetaminophen 5-325 MG per tablet   Commonly known as: NORCO   Take 1-2 tablets by mouth every 4 (four) hours as needed for  pain.         CONTINUE taking these medications         benazepril-hydrochlorthiazide 20-12.5 MG per tablet   Commonly known as: LOTENSIN HCT      eszopiclone 3 MG Tabs   Generic drug: Eszopiclone      simvastatin 20 MG tablet   Commonly known as: ZOCOR         STOP taking these medications         ADVIL PM 200-25 MG Caps      ibuprofen 200 MG tablet      ZICAM COLD REMEDY NA          Where to get your medications    These are the prescriptions that you need to pick up.   You may get these medications from any pharmacy.         ciprofloxacin 500 MG tablet   HYDROcodone-acetaminophen 5-325 MG per tablet            Followup: He will followup in 1 week for catheter and staple removal and to discuss his surgical pathology results.

## 2011-01-15 ENCOUNTER — Encounter (HOSPITAL_COMMUNITY): Payer: Self-pay | Admitting: Urology

## 2011-06-25 DIAGNOSIS — M6281 Muscle weakness (generalized): Secondary | ICD-10-CM | POA: Diagnosis not present

## 2011-06-25 DIAGNOSIS — N393 Stress incontinence (female) (male): Secondary | ICD-10-CM | POA: Diagnosis not present

## 2011-06-25 DIAGNOSIS — R279 Unspecified lack of coordination: Secondary | ICD-10-CM | POA: Diagnosis not present

## 2011-06-25 DIAGNOSIS — C61 Malignant neoplasm of prostate: Secondary | ICD-10-CM | POA: Diagnosis not present

## 2011-07-17 DIAGNOSIS — C61 Malignant neoplasm of prostate: Secondary | ICD-10-CM | POA: Diagnosis not present

## 2011-07-29 DIAGNOSIS — R279 Unspecified lack of coordination: Secondary | ICD-10-CM | POA: Diagnosis not present

## 2011-07-29 DIAGNOSIS — N393 Stress incontinence (female) (male): Secondary | ICD-10-CM | POA: Diagnosis not present

## 2011-07-29 DIAGNOSIS — M6281 Muscle weakness (generalized): Secondary | ICD-10-CM | POA: Diagnosis not present

## 2011-08-08 DIAGNOSIS — M25559 Pain in unspecified hip: Secondary | ICD-10-CM | POA: Diagnosis not present

## 2011-09-02 ENCOUNTER — Other Ambulatory Visit: Payer: Self-pay | Admitting: Family Medicine

## 2011-09-02 DIAGNOSIS — M25551 Pain in right hip: Secondary | ICD-10-CM

## 2011-09-03 ENCOUNTER — Ambulatory Visit
Admission: RE | Admit: 2011-09-03 | Discharge: 2011-09-03 | Disposition: A | Discharge status: Home / Self Care | Payer: Medicare Other | Source: Ambulatory Visit | Attending: Family Medicine | Admitting: Family Medicine

## 2011-09-03 DIAGNOSIS — R29898 Other symptoms and signs involving the musculoskeletal system: Secondary | ICD-10-CM | POA: Diagnosis not present

## 2011-09-03 DIAGNOSIS — M25551 Pain in right hip: Secondary | ICD-10-CM

## 2011-09-04 DIAGNOSIS — M25559 Pain in unspecified hip: Secondary | ICD-10-CM | POA: Diagnosis not present

## 2011-09-10 DIAGNOSIS — R279 Unspecified lack of coordination: Secondary | ICD-10-CM | POA: Diagnosis not present

## 2011-09-10 DIAGNOSIS — N393 Stress incontinence (female) (male): Secondary | ICD-10-CM | POA: Diagnosis not present

## 2011-09-10 DIAGNOSIS — M6281 Muscle weakness (generalized): Secondary | ICD-10-CM | POA: Diagnosis not present

## 2011-09-10 DIAGNOSIS — C61 Malignant neoplasm of prostate: Secondary | ICD-10-CM | POA: Diagnosis not present

## 2011-09-11 DIAGNOSIS — M25559 Pain in unspecified hip: Secondary | ICD-10-CM | POA: Diagnosis not present

## 2011-09-11 DIAGNOSIS — M76899 Other specified enthesopathies of unspecified lower limb, excluding foot: Secondary | ICD-10-CM | POA: Diagnosis not present

## 2011-09-17 DIAGNOSIS — C61 Malignant neoplasm of prostate: Secondary | ICD-10-CM | POA: Diagnosis not present

## 2011-09-18 DIAGNOSIS — M76899 Other specified enthesopathies of unspecified lower limb, excluding foot: Secondary | ICD-10-CM | POA: Diagnosis not present

## 2011-09-18 DIAGNOSIS — M25559 Pain in unspecified hip: Secondary | ICD-10-CM | POA: Diagnosis not present

## 2011-09-25 DIAGNOSIS — M25559 Pain in unspecified hip: Secondary | ICD-10-CM | POA: Diagnosis not present

## 2011-09-25 DIAGNOSIS — M76899 Other specified enthesopathies of unspecified lower limb, excluding foot: Secondary | ICD-10-CM | POA: Diagnosis not present

## 2011-09-30 DIAGNOSIS — M25559 Pain in unspecified hip: Secondary | ICD-10-CM | POA: Diagnosis not present

## 2011-10-07 DIAGNOSIS — M76899 Other specified enthesopathies of unspecified lower limb, excluding foot: Secondary | ICD-10-CM | POA: Diagnosis not present

## 2011-10-07 DIAGNOSIS — M25559 Pain in unspecified hip: Secondary | ICD-10-CM | POA: Diagnosis not present

## 2011-10-10 DIAGNOSIS — C61 Malignant neoplasm of prostate: Secondary | ICD-10-CM | POA: Diagnosis not present

## 2011-10-10 DIAGNOSIS — I10 Essential (primary) hypertension: Secondary | ICD-10-CM | POA: Diagnosis not present

## 2011-10-10 DIAGNOSIS — Z0000 Encounter for general adult medical examination without abnormal findings: Secondary | ICD-10-CM | POA: Diagnosis not present

## 2011-10-10 DIAGNOSIS — Z1159 Encounter for screening for other viral diseases: Secondary | ICD-10-CM | POA: Diagnosis not present

## 2011-10-10 DIAGNOSIS — G479 Sleep disorder, unspecified: Secondary | ICD-10-CM | POA: Diagnosis not present

## 2011-10-10 DIAGNOSIS — Z23 Encounter for immunization: Secondary | ICD-10-CM | POA: Diagnosis not present

## 2011-10-10 DIAGNOSIS — E7800 Pure hypercholesterolemia, unspecified: Secondary | ICD-10-CM | POA: Diagnosis not present

## 2011-11-20 DIAGNOSIS — M6281 Muscle weakness (generalized): Secondary | ICD-10-CM | POA: Diagnosis not present

## 2011-11-20 DIAGNOSIS — N393 Stress incontinence (female) (male): Secondary | ICD-10-CM | POA: Diagnosis not present

## 2011-11-20 DIAGNOSIS — R279 Unspecified lack of coordination: Secondary | ICD-10-CM | POA: Diagnosis not present

## 2011-11-24 DIAGNOSIS — Z23 Encounter for immunization: Secondary | ICD-10-CM | POA: Diagnosis not present

## 2011-12-27 DIAGNOSIS — M79609 Pain in unspecified limb: Secondary | ICD-10-CM | POA: Diagnosis not present

## 2012-01-14 DIAGNOSIS — C61 Malignant neoplasm of prostate: Secondary | ICD-10-CM | POA: Diagnosis not present

## 2012-01-21 DIAGNOSIS — N529 Male erectile dysfunction, unspecified: Secondary | ICD-10-CM | POA: Diagnosis not present

## 2012-01-21 DIAGNOSIS — C61 Malignant neoplasm of prostate: Secondary | ICD-10-CM | POA: Diagnosis not present

## 2012-01-21 DIAGNOSIS — E291 Testicular hypofunction: Secondary | ICD-10-CM | POA: Diagnosis not present

## 2012-01-21 DIAGNOSIS — N393 Stress incontinence (female) (male): Secondary | ICD-10-CM | POA: Diagnosis not present

## 2012-04-13 DIAGNOSIS — E7800 Pure hypercholesterolemia, unspecified: Secondary | ICD-10-CM | POA: Diagnosis not present

## 2012-04-13 DIAGNOSIS — Z23 Encounter for immunization: Secondary | ICD-10-CM | POA: Diagnosis not present

## 2012-04-13 DIAGNOSIS — G479 Sleep disorder, unspecified: Secondary | ICD-10-CM | POA: Diagnosis not present

## 2012-04-13 DIAGNOSIS — C61 Malignant neoplasm of prostate: Secondary | ICD-10-CM | POA: Diagnosis not present

## 2012-04-13 DIAGNOSIS — I10 Essential (primary) hypertension: Secondary | ICD-10-CM | POA: Diagnosis not present

## 2012-04-13 DIAGNOSIS — Z961 Presence of intraocular lens: Secondary | ICD-10-CM | POA: Diagnosis not present

## 2012-05-28 DIAGNOSIS — C61 Malignant neoplasm of prostate: Secondary | ICD-10-CM | POA: Diagnosis not present

## 2012-06-04 DIAGNOSIS — N529 Male erectile dysfunction, unspecified: Secondary | ICD-10-CM | POA: Diagnosis not present

## 2012-06-04 DIAGNOSIS — E291 Testicular hypofunction: Secondary | ICD-10-CM | POA: Diagnosis not present

## 2012-06-04 DIAGNOSIS — N393 Stress incontinence (female) (male): Secondary | ICD-10-CM | POA: Diagnosis not present

## 2012-06-04 DIAGNOSIS — C61 Malignant neoplasm of prostate: Secondary | ICD-10-CM | POA: Diagnosis not present

## 2012-06-07 DIAGNOSIS — L821 Other seborrheic keratosis: Secondary | ICD-10-CM | POA: Diagnosis not present

## 2012-06-07 DIAGNOSIS — D1801 Hemangioma of skin and subcutaneous tissue: Secondary | ICD-10-CM | POA: Diagnosis not present

## 2012-06-07 DIAGNOSIS — Z85828 Personal history of other malignant neoplasm of skin: Secondary | ICD-10-CM | POA: Diagnosis not present

## 2012-06-07 DIAGNOSIS — D485 Neoplasm of uncertain behavior of skin: Secondary | ICD-10-CM | POA: Diagnosis not present

## 2012-06-07 DIAGNOSIS — L570 Actinic keratosis: Secondary | ICD-10-CM | POA: Diagnosis not present

## 2012-06-07 DIAGNOSIS — D0420 Carcinoma in situ of skin of unspecified ear and external auricular canal: Secondary | ICD-10-CM | POA: Diagnosis not present

## 2012-06-07 DIAGNOSIS — C44519 Basal cell carcinoma of skin of other part of trunk: Secondary | ICD-10-CM | POA: Diagnosis not present

## 2012-06-08 DIAGNOSIS — M2040 Other hammer toe(s) (acquired), unspecified foot: Secondary | ICD-10-CM | POA: Diagnosis not present

## 2012-06-23 DIAGNOSIS — M205X9 Other deformities of toe(s) (acquired), unspecified foot: Secondary | ICD-10-CM | POA: Diagnosis not present

## 2012-06-23 DIAGNOSIS — M25579 Pain in unspecified ankle and joints of unspecified foot: Secondary | ICD-10-CM | POA: Diagnosis not present

## 2012-07-09 DIAGNOSIS — W57XXXA Bitten or stung by nonvenomous insect and other nonvenomous arthropods, initial encounter: Secondary | ICD-10-CM | POA: Diagnosis not present

## 2012-07-09 DIAGNOSIS — S30860A Insect bite (nonvenomous) of lower back and pelvis, initial encounter: Secondary | ICD-10-CM | POA: Diagnosis not present

## 2012-07-14 ENCOUNTER — Other Ambulatory Visit: Payer: Self-pay | Admitting: Dermatology

## 2012-07-14 DIAGNOSIS — Z85828 Personal history of other malignant neoplasm of skin: Secondary | ICD-10-CM | POA: Diagnosis not present

## 2012-07-14 DIAGNOSIS — C44519 Basal cell carcinoma of skin of other part of trunk: Secondary | ICD-10-CM | POA: Diagnosis not present

## 2012-07-22 DIAGNOSIS — C4491 Basal cell carcinoma of skin, unspecified: Secondary | ICD-10-CM | POA: Diagnosis not present

## 2012-07-22 DIAGNOSIS — Z4800 Encounter for change or removal of nonsurgical wound dressing: Secondary | ICD-10-CM | POA: Diagnosis not present

## 2012-10-06 DIAGNOSIS — C61 Malignant neoplasm of prostate: Secondary | ICD-10-CM | POA: Diagnosis not present

## 2012-10-13 DIAGNOSIS — C61 Malignant neoplasm of prostate: Secondary | ICD-10-CM | POA: Diagnosis not present

## 2012-10-13 DIAGNOSIS — N393 Stress incontinence (female) (male): Secondary | ICD-10-CM | POA: Diagnosis not present

## 2012-10-14 DIAGNOSIS — M6530 Trigger finger, unspecified finger: Secondary | ICD-10-CM | POA: Diagnosis not present

## 2012-10-14 DIAGNOSIS — I10 Essential (primary) hypertension: Secondary | ICD-10-CM | POA: Diagnosis not present

## 2012-10-14 DIAGNOSIS — Z0000 Encounter for general adult medical examination without abnormal findings: Secondary | ICD-10-CM | POA: Diagnosis not present

## 2012-10-14 DIAGNOSIS — C61 Malignant neoplasm of prostate: Secondary | ICD-10-CM | POA: Diagnosis not present

## 2012-10-14 DIAGNOSIS — Z23 Encounter for immunization: Secondary | ICD-10-CM | POA: Diagnosis not present

## 2012-10-14 DIAGNOSIS — G479 Sleep disorder, unspecified: Secondary | ICD-10-CM | POA: Diagnosis not present

## 2012-10-14 DIAGNOSIS — E7800 Pure hypercholesterolemia, unspecified: Secondary | ICD-10-CM | POA: Diagnosis not present

## 2013-02-23 DIAGNOSIS — C61 Malignant neoplasm of prostate: Secondary | ICD-10-CM | POA: Diagnosis not present

## 2013-03-02 ENCOUNTER — Telehealth: Payer: Self-pay | Admitting: Oncology

## 2013-03-02 DIAGNOSIS — C61 Malignant neoplasm of prostate: Secondary | ICD-10-CM | POA: Diagnosis not present

## 2013-03-02 NOTE — Telephone Encounter (Signed)
S/W PT AND GVE NP APPT 01/15 @ 10:30 W/DR. [place] PACK[facility] MAILED.

## 2013-03-02 NOTE — Telephone Encounter (Signed)
C/D 03/02/12 for appt. 03/10/13

## 2013-03-04 ENCOUNTER — Other Ambulatory Visit: Payer: Self-pay | Admitting: Oncology

## 2013-03-04 DIAGNOSIS — C61 Malignant neoplasm of prostate: Secondary | ICD-10-CM

## 2013-03-10 ENCOUNTER — Telehealth: Payer: Self-pay | Admitting: Oncology

## 2013-03-10 ENCOUNTER — Encounter: Payer: Self-pay | Admitting: Oncology

## 2013-03-10 ENCOUNTER — Ambulatory Visit (HOSPITAL_BASED_OUTPATIENT_CLINIC_OR_DEPARTMENT_OTHER): Payer: Medicare Other | Admitting: Oncology

## 2013-03-10 ENCOUNTER — Other Ambulatory Visit (HOSPITAL_BASED_OUTPATIENT_CLINIC_OR_DEPARTMENT_OTHER): Payer: Medicare Other

## 2013-03-10 ENCOUNTER — Ambulatory Visit: Payer: Medicare Other

## 2013-03-10 VITALS — BP 144/65 | HR 64 | Temp 97.8°F | Resp 18 | Ht 71.0 in | Wt 186.6 lb

## 2013-03-10 DIAGNOSIS — C61 Malignant neoplasm of prostate: Secondary | ICD-10-CM

## 2013-03-10 DIAGNOSIS — E291 Testicular hypofunction: Secondary | ICD-10-CM

## 2013-03-10 LAB — CBC WITH DIFFERENTIAL/PLATELET
BASO%: 1.2 % (ref 0.0–2.0)
Basophils Absolute: 0.1 10*3/uL (ref 0.0–0.1)
EOS%: 2 % (ref 0.0–7.0)
Eosinophils Absolute: 0.1 10*3/uL (ref 0.0–0.5)
HCT: 40.6 % (ref 38.4–49.9)
HGB: 13.6 g/dL (ref 13.0–17.1)
LYMPH%: 31.9 % (ref 14.0–49.0)
MCH: 29.9 pg (ref 27.2–33.4)
MCHC: 33.5 g/dL (ref 32.0–36.0)
MCV: 89.4 fL (ref 79.3–98.0)
MONO#: 0.5 10*3/uL (ref 0.1–0.9)
MONO%: 8.7 % (ref 0.0–14.0)
NEUT#: 3.4 10*3/uL (ref 1.5–6.5)
NEUT%: 56.2 % (ref 39.0–75.0)
Platelets: 201 10*3/uL (ref 140–400)
RBC: 4.55 10*6/uL (ref 4.20–5.82)
RDW: 12.9 % (ref 11.0–14.6)
WBC: 6.1 10*3/uL (ref 4.0–10.3)
lymph#: 2 10*3/uL (ref 0.9–3.3)

## 2013-03-10 LAB — COMPREHENSIVE METABOLIC PANEL (CC13)
ALT: 18 U/L (ref 0–55)
AST: 23 U/L (ref 5–34)
Albumin: 4 g/dL (ref 3.5–5.0)
Alkaline Phosphatase: 58 U/L (ref 40–150)
Anion Gap: 8 mEq/L (ref 3–11)
BUN: 21.5 mg/dL (ref 7.0–26.0)
CO2: 28 mEq/L (ref 22–29)
Calcium: 9.8 mg/dL (ref 8.4–10.4)
Chloride: 103 mEq/L (ref 98–109)
Creatinine: 0.9 mg/dL (ref 0.7–1.3)
Glucose: 111 mg/dl (ref 70–140)
Potassium: 4 mEq/L (ref 3.5–5.1)
Sodium: 140 mEq/L (ref 136–145)
Total Bilirubin: 0.52 mg/dL (ref 0.20–1.20)
Total Protein: 7 g/dL (ref 6.4–8.3)

## 2013-03-10 LAB — TESTOSTERONE: Testosterone: 48 ng/dL — ABNORMAL LOW (ref 300–890)

## 2013-03-10 LAB — PSA: PSA: 0.01 ng/mL (ref <=4.00)

## 2013-03-10 NOTE — Progress Notes (Signed)
Checked in new pt with no financial concerns. °

## 2013-03-10 NOTE — Consult Note (Signed)
Reason for Referral: Prostate cancer.  HPI: 68 year old gentleman currently of [place] diagnosed with prostate cancer dating back to 2012. He presented with a PSA of 4.38 and subsequently underwent a radical prostatectomy and lymph node dissection on November of 2012. 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 He did not have any evidence of angiolymphatic invasion or extraprostatic extension. The margins were negative. He had one out of 12 lymph nodes involved with cancer predominantly in the right pelvic wall. His post operative PSA remained at 0.65 and subsequently to 0.89. And in March of 2013 he was started on Lupron and have been on it since that time. His PSA remains very low  0.01 most recently on 02/23/2013. He was referred to me for the evaluation and management of his prostate cancer moving forward. Clinically, he is completely asymptomatic. He has not reported any weight loss or appetite changes. Did not report any constitutional symptoms. Does not report any abdominal pain or satiety. Did not report any back pain shoulder pain or any bone discomfort. Has not reported any hematochezia or melena. Has not reported any hematuria. He does report incontinence and erectile dysfunction. He continued to perform activities of daily living without any hindrance or decline.   Past Medical History  Diagnosis Date  . Hypertension   . Recurrent upper respiratory infection (URI)     pt had a cold recent- week ago - better now   . Cancer     prostate cancer   . Arthritis     hands   :  Past Surgical History  Procedure Laterality Date  . Other surgical history  2005    right hand middle finger surgery   . Robot assisted laparoscopic radical prostatectomy  01/13/2011    Procedure: ROBOTIC ASSISTED LAPAROSCOPIC RADICAL PROSTATECTOMY LEVEL 2;  Surgeon: Dutch Gray, MD;  Location: [facility];  Service: Urology;  Laterality:  Bilateral;  Robotic Assisted Laparoscopic Prostatetectomy with Bilateral Lymphadenectomy  Level 2  :  Current Outpatient Prescriptions  Medication Sig Dispense Refill  . ascorbic acid (VITAMIN C) 1000 MG tablet Take 1,000 mg by mouth 3 (three) times a week.      . benazepril-hydrochlorthiazide (LOTENSIN HCT) 20-12.5 MG per tablet Take 1 tablet by mouth every morning.        . Calcium Carbonate-Vitamin D (CALCIUM 600+D3 PO) Take 1 tablet by mouth daily. Patient takes 1-2 tablets daily      . diphenhydrAMINE (BENADRYL) 25 MG tablet Take 25 mg by mouth every 8 (eight) hours as needed. For allergies      . Eszopiclone (ESZOPICLONE) 3 MG TABS Take 3 mg by mouth at bedtime as needed. Take immediately before bedtime. Sleep       . Ibuprofen-Diphenhydramine Cit (ADVIL PM PO) Take 1 tablet by mouth at bedtime as needed.      . MULTIPLE VITAMIN PO Take 1 tablet by mouth daily.      . simvastatin (ZOCOR) 20 MG tablet Take 20 mg by mouth at bedtime.       . traZODone (DESYREL) 50 MG tablet Take 50 mg by mouth at bedtime as needed for sleep.       No current facility-administered medications for this visit.       No Known Allergies:  No family history on file.:  History   Social History  . Marital Status: Married    Spouse Name: N/A    Number of Children: N/A  . Years  of Education: N/A   Occupational History  . Not on file.   Social History Main Topics  . Smoking status: Never Smoker   . Smokeless tobacco: Never Used  . Alcohol Use: 2.4 oz/week    2 Glasses of wine, 2 Cans of beer per week  . Drug Use: No  . Sexual Activity:    Other Topics Concern  . Not on file   Social History Narrative  . No narrative on file  :  Constitutional: negative for anorexia, chills and fatigue Eyes: negative for icterus, irritation and redness Ears, nose, mouth, throat, and face: negative for hearing loss, hoarseness, snoring and sore throat Respiratory: negative for asthma, dyspnea on exertion  and hemoptysis Cardiovascular: negative for chest pain, dyspnea and lower extremity edema Gastrointestinal: negative for abdominal pain, constipation and diarrhea Genitourinary:negative for dysuria, hematuria and hesitancy Integument/breast: negative for rash and skin color change Hematologic/lymphatic: negative for bleeding and lymphadenopathy Musculoskeletal:negative for back pain and bone pain Neurological: negative for dizziness, gait problems and headaches Behavioral/Psych: negative for depression and fatigue Endocrine: negative for temperature intolerance Allergic/Immunologic: negative for angioedema and urticaria  Exam: Blood pressure 144/65, pulse 64, temperature 97.8 F (36.6 C), temperature source Oral, resp. rate 18, height 5\' 11"  (1.803 m), weight 186 lb 9.6 oz (84.641 kg). General appearance: alert, cooperative and appears stated age Head: Normocephalic, without obvious abnormality, atraumatic Nose: Nares normal. Septum midline. Mucosa normal. No drainage or sinus tenderness. Throat: lips, mucosa, and tongue normal; teeth and gums normal Neck: no adenopathy, no carotid bruit, no JVD, supple, symmetrical, trachea midline and thyroid not enlarged, symmetric, no tenderness/mass/nodules Back: symmetric, no curvature. ROM normal. No CVA tenderness. Resp: clear to auscultation bilaterally Chest wall: no tenderness Cardio: regular rate and rhythm, S1, S2 normal, no murmur, click, rub or gallop GI: soft, non-tender; bowel sounds normal; no masses,  no organomegaly Extremities: extremities normal, atraumatic, no cyanosis or edema Pulses: 2+ and symmetric Skin: Skin color, texture, turgor normal. No rashes or lesions Lymph nodes: Cervical, supraclavicular, and axillary nodes normal. Neurologic: Grossly normal   Recent Labs  03/10/13 1044  WBC 6.1  HGB 13.6  HCT 40.6  PLT 201    Recent Labs  03/10/13 1044  NA 140  K 4.0  CO2 28  GLUCOSE 111  BUN 21.5  CREATININE 0.9   CALCIUM 9.8     Assessment and Plan:   68 year old gentleman with the following issues:  1. Prostate cancer diagnosed in November of 2012 presented with a PSA of 4.38 and found to have a Gleason score 4+3 equals 7, his clinical staging was T2c N1. He is status post robotic prostatectomy and lymphadenectomy with one of 12 lymph nodes were involved. He continued to have elevated PSA that is detectable around 0.68 and 0.84 and had been on Lupron since March of 2013. His PSA has been undetectable since then. His pathology revealed adenocarcinoma with neuroendocrine features. I had a lengthy discussion discussing the natural course of prostate cancer more specifically advanced disease with lymph node involvement and neuroendocrine differentiation aspect of his cancer. At this point, he is certainly on the appropriate treatment and I do not see any need for any additional treatment. But he is certainly at high risk of relapse not only because of his lymph node involvement but also because of the poor differentiation of his neuroendocrine component. He is at risk of developing advanced disease without PSA been elevated which necessitates imaging study to be done on an intermittent basis.  I've explained to him that if he does develop advanced disease from and neuroendocrine component it would be hormone insensitive will likely require systemic chemotherapy. For management standpoint, I recommended he continues on Lupron as he is doing right now I will repeat imaging studies the chest abdomen and pelvis in about 6 months and a followup at that time.  2. Androgen depravation: He is to continue Lupron with Dr. Alinda Money has prescribed.  3. Bone health: I agree with calcium and vitamin D and repeat bone density frequently.  4. Smoking cessation: He has no tobacco use at this time.

## 2013-03-10 NOTE — Telephone Encounter (Signed)
gv and printed appt sched and avs for pt for July....gv pt barium °

## 2013-03-10 NOTE — Progress Notes (Signed)
Please see consult note.  

## 2013-05-20 DIAGNOSIS — G479 Sleep disorder, unspecified: Secondary | ICD-10-CM | POA: Diagnosis not present

## 2013-05-20 DIAGNOSIS — E7800 Pure hypercholesterolemia, unspecified: Secondary | ICD-10-CM | POA: Diagnosis not present

## 2013-05-20 DIAGNOSIS — C61 Malignant neoplasm of prostate: Secondary | ICD-10-CM | POA: Diagnosis not present

## 2013-05-20 DIAGNOSIS — I10 Essential (primary) hypertension: Secondary | ICD-10-CM | POA: Diagnosis not present

## 2013-06-23 DIAGNOSIS — D219 Benign neoplasm of connective and other soft tissue, unspecified: Secondary | ICD-10-CM | POA: Diagnosis not present

## 2013-06-23 DIAGNOSIS — L570 Actinic keratosis: Secondary | ICD-10-CM | POA: Diagnosis not present

## 2013-06-23 DIAGNOSIS — L820 Inflamed seborrheic keratosis: Secondary | ICD-10-CM | POA: Diagnosis not present

## 2013-06-23 DIAGNOSIS — Z85828 Personal history of other malignant neoplasm of skin: Secondary | ICD-10-CM | POA: Diagnosis not present

## 2013-06-23 DIAGNOSIS — D239 Other benign neoplasm of skin, unspecified: Secondary | ICD-10-CM | POA: Diagnosis not present

## 2013-06-23 DIAGNOSIS — L821 Other seborrheic keratosis: Secondary | ICD-10-CM | POA: Diagnosis not present

## 2013-06-24 DIAGNOSIS — C61 Malignant neoplasm of prostate: Secondary | ICD-10-CM | POA: Diagnosis not present

## 2013-06-30 DIAGNOSIS — C61 Malignant neoplasm of prostate: Secondary | ICD-10-CM | POA: Diagnosis not present

## 2013-06-30 DIAGNOSIS — E291 Testicular hypofunction: Secondary | ICD-10-CM | POA: Diagnosis not present

## 2013-09-05 ENCOUNTER — Ambulatory Visit (HOSPITAL_COMMUNITY)
Admission: RE | Admit: 2013-09-05 | Discharge: 2013-09-05 | Disposition: A | Discharge status: Home / Self Care | Payer: Medicare Other | Source: Ambulatory Visit | Attending: Oncology | Admitting: Oncology

## 2013-09-05 ENCOUNTER — Encounter (HOSPITAL_COMMUNITY): Payer: Self-pay

## 2013-09-05 ENCOUNTER — Other Ambulatory Visit (HOSPITAL_BASED_OUTPATIENT_CLINIC_OR_DEPARTMENT_OTHER): Payer: Medicare Other

## 2013-09-05 DIAGNOSIS — N329 Bladder disorder, unspecified: Secondary | ICD-10-CM | POA: Insufficient documentation

## 2013-09-05 DIAGNOSIS — R918 Other nonspecific abnormal finding of lung field: Secondary | ICD-10-CM | POA: Diagnosis not present

## 2013-09-05 DIAGNOSIS — Z9079 Acquired absence of other genital organ(s): Secondary | ICD-10-CM | POA: Diagnosis not present

## 2013-09-05 DIAGNOSIS — C61 Malignant neoplasm of prostate: Secondary | ICD-10-CM | POA: Insufficient documentation

## 2013-09-05 DIAGNOSIS — K7689 Other specified diseases of liver: Secondary | ICD-10-CM | POA: Insufficient documentation

## 2013-09-05 DIAGNOSIS — Z8546 Personal history of malignant neoplasm of prostate: Secondary | ICD-10-CM | POA: Diagnosis not present

## 2013-09-05 DIAGNOSIS — I700 Atherosclerosis of aorta: Secondary | ICD-10-CM | POA: Insufficient documentation

## 2013-09-05 LAB — COMPREHENSIVE METABOLIC PANEL (CC13)
ALT: 19 U/L (ref 0–55)
AST: 21 U/L (ref 5–34)
Albumin: 3.9 g/dL (ref 3.5–5.0)
Alkaline Phosphatase: 49 U/L (ref 40–150)
Anion Gap: 11 mEq/L (ref 3–11)
BUN: 19 mg/dL (ref 7.0–26.0)
CO2: 24 mEq/L (ref 22–29)
Calcium: 9.9 mg/dL (ref 8.4–10.4)
Chloride: 106 mEq/L (ref 98–109)
Creatinine: 0.9 mg/dL (ref 0.7–1.3)
Glucose: 102 mg/dl (ref 70–140)
Potassium: 4 mEq/L (ref 3.5–5.1)
Sodium: 141 mEq/L (ref 136–145)
Total Bilirubin: 0.61 mg/dL (ref 0.20–1.20)
Total Protein: 6.8 g/dL (ref 6.4–8.3)

## 2013-09-05 LAB — CBC WITH DIFFERENTIAL/PLATELET
BASO%: 1.3 % (ref 0.0–2.0)
Basophils Absolute: 0.1 10*3/uL (ref 0.0–0.1)
EOS%: 2.5 % (ref 0.0–7.0)
Eosinophils Absolute: 0.1 10*3/uL (ref 0.0–0.5)
HCT: 42.9 % (ref 38.4–49.9)
HGB: 14.2 g/dL (ref 13.0–17.1)
LYMPH%: 33.4 % (ref 14.0–49.0)
MCH: 29.7 pg (ref 27.2–33.4)
MCHC: 33 g/dL (ref 32.0–36.0)
MCV: 89.8 fL (ref 79.3–98.0)
MONO#: 0.5 10*3/uL (ref 0.1–0.9)
MONO%: 9.1 % (ref 0.0–14.0)
NEUT#: 3 10*3/uL (ref 1.5–6.5)
NEUT%: 53.7 % (ref 39.0–75.0)
Platelets: 189 10*3/uL (ref 140–400)
RBC: 4.77 10*6/uL (ref 4.20–5.82)
RDW: 13.2 % (ref 11.0–14.6)
WBC: 5.7 10*3/uL (ref 4.0–10.3)
lymph#: 1.9 10*3/uL (ref 0.9–3.3)

## 2013-09-05 MED ORDER — IOHEXOL 300 MG/ML  SOLN
100.0000 mL | Freq: Once | INTRAMUSCULAR | Status: AC | PRN
  Administered 2013-09-05: 100 mL via INTRAVENOUS

## 2013-09-06 LAB — PSA: PSA: 0.04 ng/mL (ref <=4.00)

## 2013-09-08 ENCOUNTER — Ambulatory Visit (HOSPITAL_BASED_OUTPATIENT_CLINIC_OR_DEPARTMENT_OTHER): Payer: Medicare Other | Admitting: Oncology

## 2013-09-08 ENCOUNTER — Encounter: Payer: Self-pay | Admitting: Oncology

## 2013-09-08 ENCOUNTER — Telehealth: Payer: Self-pay | Admitting: Oncology

## 2013-09-08 VITALS — BP 138/62 | HR 67 | Temp 97.7°F | Wt 187.0 lb

## 2013-09-08 DIAGNOSIS — C61 Malignant neoplasm of prostate: Secondary | ICD-10-CM

## 2013-09-08 NOTE — Progress Notes (Signed)
Hematology and Oncology Follow Up Visit  [name] [id number] 11-04-1945 68 y.o. 09/08/2013 9:48 AM   Principle Diagnosis: 68 year old gentleman diagnosed with prostate cancer in 2012. He presented with a PSA of 4.38 and a Gleason score 4+3 equals 7. The final pathology showed T2 N1 disease with neuroendocrine features.   Prior Therapy: He underwent a radical prostatectomy and lymph node dissection on November of 2012. 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 He had one out of 12 lymph nodes involved with cancer predominantly in the right pelvic wall. His post operative PSA remained at 0.65 and subsequently to 0.89.  And in March of 2013 he was started on Lupron and have been on it since that time. His PSA remains very low 0.01 most recently on 02/23/2013.  Current therapy: Lupron given every 4 months.  Interim History:  [name] presents today for a followup visit. He is a pleasant gentleman with the above issue returns today with his wife. Since the last visit, he has no new complaints. He continued to tolerate Lupron with very little side effects. He does report some occasional hot flashes but they have been manageable. Has not reported any urinary symptoms at this time. He continues to have excellent performance status without any decline. He does not report any headaches or blurry vision. Does not report any syncope or seizures. He is not reporting any chest pain shortness of breath or cough. Does not report any hemoptysis or hematemesis. Is a report any nausea or vomiting or abdominal pain. Does not report any hematuria or dysuria. He has no skeletal complaints. Rest or view of system is unremarkable.  Medications: I have reviewed the patient's current medications.  Current Outpatient Prescriptions  Medication Sig Dispense Refill  . ascorbic acid (VITAMIN C) 1000 MG tablet Take 1,000 mg by mouth 3 (three) times  a week.      . benazepril-hydrochlorthiazide (LOTENSIN HCT) 20-12.5 MG per tablet Take 1 tablet by mouth every morning.        . Calcium Carbonate-Vitamin D (CALCIUM 600+D3 PO) Take 1 tablet by mouth daily. Patient takes 1-2 tablets daily      . diphenhydrAMINE (BENADRYL) 25 MG tablet Take 25 mg by mouth every 8 (eight) hours as needed. For allergies      . Eszopiclone (ESZOPICLONE) 3 MG TABS Take 3 mg by mouth at bedtime as needed. Take immediately before bedtime. Sleep       . Ibuprofen-Diphenhydramine Cit (ADVIL PM PO) Take 1 tablet by mouth at bedtime as needed.      . MULTIPLE VITAMIN PO Take 1 tablet by mouth daily.      . simvastatin (ZOCOR) 20 MG tablet Take 20 mg by mouth at bedtime.       . traZODone (DESYREL) 50 MG tablet Take 50 mg by mouth at bedtime as needed for sleep.       No current facility-administered medications for this visit.     Allergies: No Known Allergies  Past Medical History, Surgical history, Social history, and Family History were reviewed and updated.   Physical Exam: Blood pressure 138/62, pulse 67, temperature 97.7 F (36.5 C), temperature source Oral, weight 187 lb (84.823 kg). ECOG:  General appearance: alert and cooperative Head: Normocephalic, without obvious abnormality Neck: no adenopathy Lymph nodes: Cervical, supraclavicular, and axillary nodes normal. Heart:regular rate and rhythm, S1, S2 normal, no murmur, click, rub or gallop Lung:chest clear, no wheezing, rales, normal symmetric  air entry Abdomin: soft, non-tender, without masses or organomegaly EXT:no erythema, induration, or nodules   Lab Results: Lab Results  Component Value Date   WBC 5.7 09/05/2013   HGB 14.2 09/05/2013   HCT 42.9 09/05/2013   MCV 89.8 09/05/2013   PLT 189 09/05/2013     Chemistry      Component Value Date/Time   NA 141 09/05/2013 0808   NA 133* 01/14/2011 0517   K 4.0 09/05/2013 0808   K 4.1 01/14/2011 0517   CL 101 01/14/2011 0517   CO2 24 09/05/2013  0808   CO2 26 01/14/2011 0517   BUN 19.0 09/05/2013 0808   BUN 15 01/14/2011 0517   CREATININE 0.9 09/05/2013 0808   CREATININE 0.86 01/14/2011 0517      Component Value Date/Time   CALCIUM 9.9 09/05/2013 0808   CALCIUM 8.6 01/14/2011 0517   ALKPHOS 49 09/05/2013 0808   AST 21 09/05/2013 0808   ALT 19 09/05/2013 0808   BILITOT 0.61 09/05/2013 0808     Results for Sensing, Semaje L (MRN [id number]) as of 09/08/2013 09:44  Ref. Range 03/10/2013 10:45 09/05/2013 08:08  PSA Latest Range: <=4.00 ng/mL 0.01 0.04    Radiological Studies:  EXAM:  CT CHEST, ABDOMEN, AND PELVIS WITH CONTRAST  TECHNIQUE:  Multidetector CT imaging of the chest, abdomen and pelvis was  performed following the standard protocol during bolus  administration of intravenous contrast.  CONTRAST: 143mL OMNIPAQUE IOHEXOL 300 MG/ML SOLN  COMPARISON: Abdominal pelvic CT 11/18/2010. Pelvic MRI 12/06/2010.  FINDINGS:  CT CHEST FINDINGS  There are no enlarged mediastinal, hilar or axillary lymph nodes.  The heart size is normal. There is mild atherosclerosis of the  aorta, great vessels and coronary arteries. There is borderline  dilatation of the ascending aorta to 4.0 cm.  There is no pleural or pericardial effusion. There are few scattered  nodules in the right lung which are predominantly subpleural and all  less than 4 mm in diameter. No suspicious nodules are demonstrated.  There are no worrisome osseous findings. There is a probable small  bone island within the right humeral head.  CT ABDOMEN AND PELVIS FINDINGS  The liver demonstrates steatosis, but no focal lesion. There is no  biliary dilatation. The gallbladder, pancreas and spleen appear  normal.  There is no adrenal mass. There are stable low-density renal lesions  bilaterally. There is no hydronephrosis.  There is stable mild atherosclerosis of the aorta, its branches and  iliac arteries. No pathologically enlarged abdominal pelvic lymph  nodes are  identified. The stomach, small bowel and colon appear  normal.  There are postsurgical changes status post interval prostatectomy.  The bladder neck is low lying. Focal high density within the bladder  neck (axial image 128) may represent postsurgical change (especially  suggested on the sagittal images). A bladder calculus is difficult  to exclude. There is stable asymmetric fat within the left inguinal  canal.  There are no worrisome osseous findings. Bone island in the left  iliac bone (image 102) is unchanged.  IMPRESSION:  1. No findings suspicious for metastatic prostate cancer.  2. Nonspecific small right lung nodules, likely postinflammatory  and/or lymph nodes. Stability can be addressed on follow-up imaging  as warranted clinically.  3. Focal high density within the bladder neck may be postsurgical,  although a calculus cannot be completely excluded.   Impression and Plan:  68 year old gentleman with the following issues:   1. Prostate cancer diagnosed in November of 2012  presented with a PSA of 4.38 and found to have a Gleason score 4+3 equals 7, his clinical staging was T2c N1. He did have a mild rise in his PSA and currently on Lupron. His PSA from 09/05/2013 as well as a CT scan were reviewed and showed no evidence of any disease. I recommend continuing Lupron under the care of Dr. Alinda Money at this time and periodically repeat his imaging studies. I will set him up with a scan in March of 2016. Certainly this can be given sooner if he develops any symptoms arise in his PSA. Given the neuroendocrine features of his cancer, is very possible that he could develop metastatic disease with low PSA.  2. Bone health: I agree with calcium and vitamin D and repeat bone density frequently.   [facility], MD 7/16/20159:48 AM

## 2013-09-08 NOTE — Telephone Encounter (Signed)
gva dnprinted appt sched and avs for pt for March 2016....gv pt barium

## 2013-09-13 DIAGNOSIS — Z961 Presence of intraocular lens: Secondary | ICD-10-CM | POA: Diagnosis not present

## 2013-10-20 DIAGNOSIS — Z1211 Encounter for screening for malignant neoplasm of colon: Secondary | ICD-10-CM | POA: Diagnosis not present

## 2013-10-20 DIAGNOSIS — C61 Malignant neoplasm of prostate: Secondary | ICD-10-CM | POA: Diagnosis not present

## 2013-10-20 DIAGNOSIS — Z1331 Encounter for screening for depression: Secondary | ICD-10-CM | POA: Diagnosis not present

## 2013-10-20 DIAGNOSIS — Z0000 Encounter for general adult medical examination without abnormal findings: Secondary | ICD-10-CM | POA: Diagnosis not present

## 2013-10-20 DIAGNOSIS — Z23 Encounter for immunization: Secondary | ICD-10-CM | POA: Diagnosis not present

## 2013-10-20 DIAGNOSIS — G479 Sleep disorder, unspecified: Secondary | ICD-10-CM | POA: Diagnosis not present

## 2013-10-20 DIAGNOSIS — E7800 Pure hypercholesterolemia, unspecified: Secondary | ICD-10-CM | POA: Diagnosis not present

## 2013-10-20 DIAGNOSIS — I10 Essential (primary) hypertension: Secondary | ICD-10-CM | POA: Diagnosis not present

## 2013-10-21 IMAGING — US US EXTREM LOW*R* LIMITED
1 series · 6 of 6 positions shown · non-contrast
Comparison: None.

CLINICAL DATA: Palpable area overlying the lateral right hip

ULTRASOUND RIGHT LOWER EXTREMITY COMPLETE
TECHNIQUE: Ultrasound examination of the hipwas performed
including evaluation of the muscles, tendons, joint, and adjacent
soft tissues.

[Series 1: us extrem low*right* limited · 0.08mm/px · 6 of 6 slices shown]
[im 1/6]
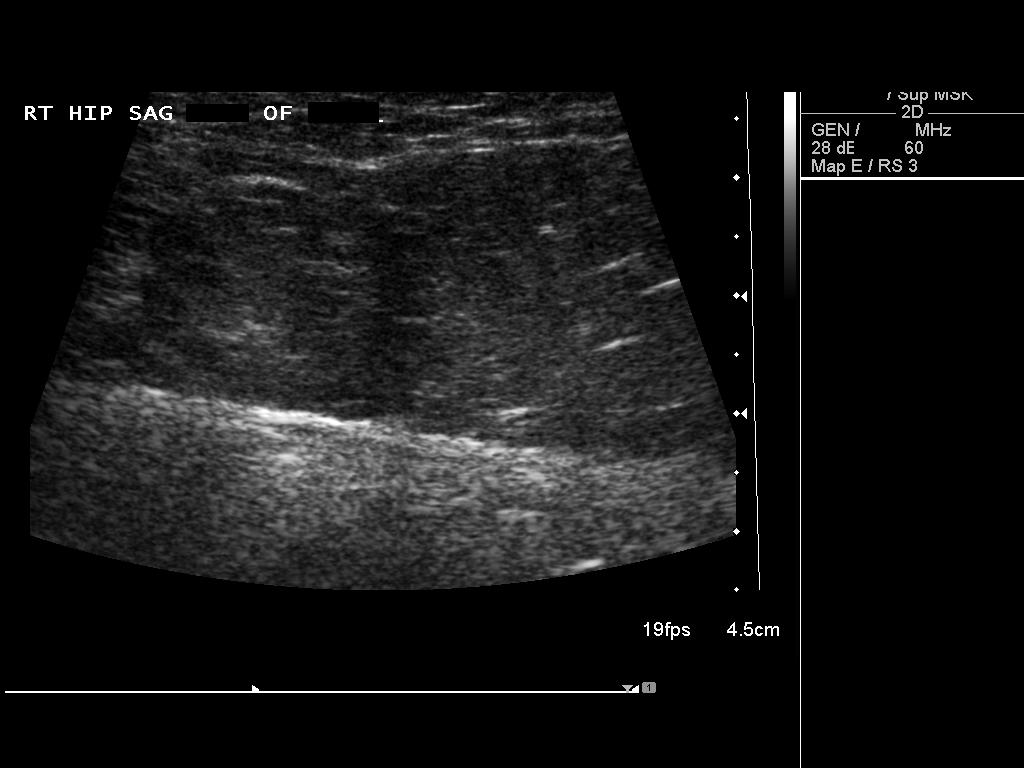
[im 2/6]
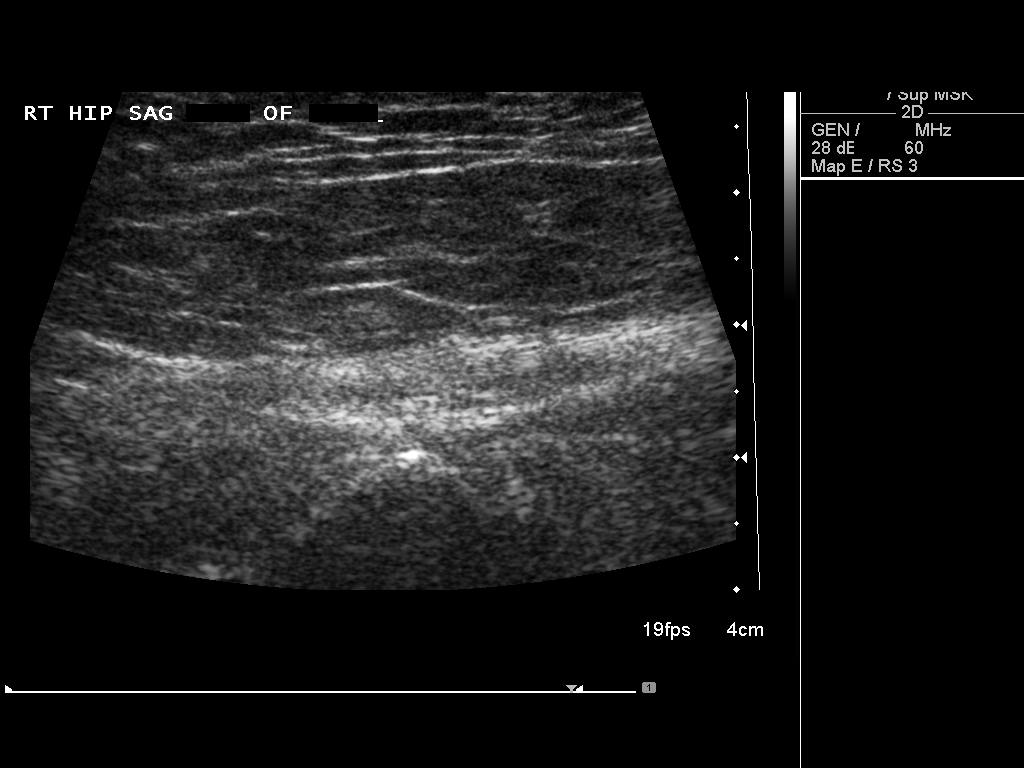
[im 3/6]
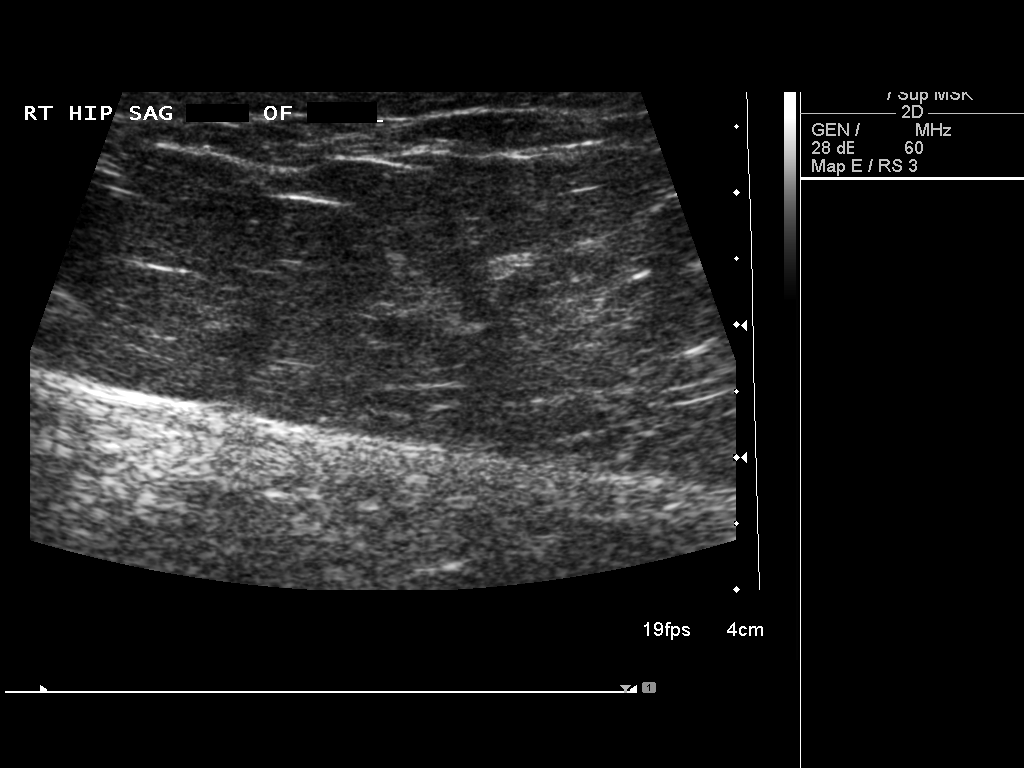
[im 4/6]
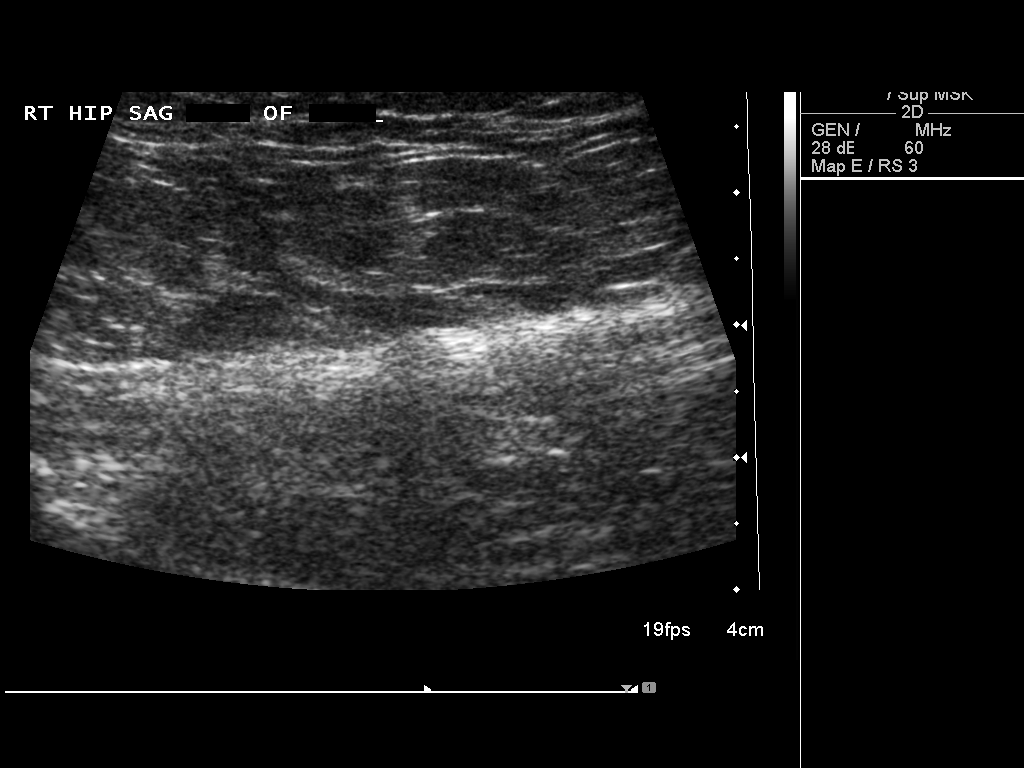
[im 5/6]
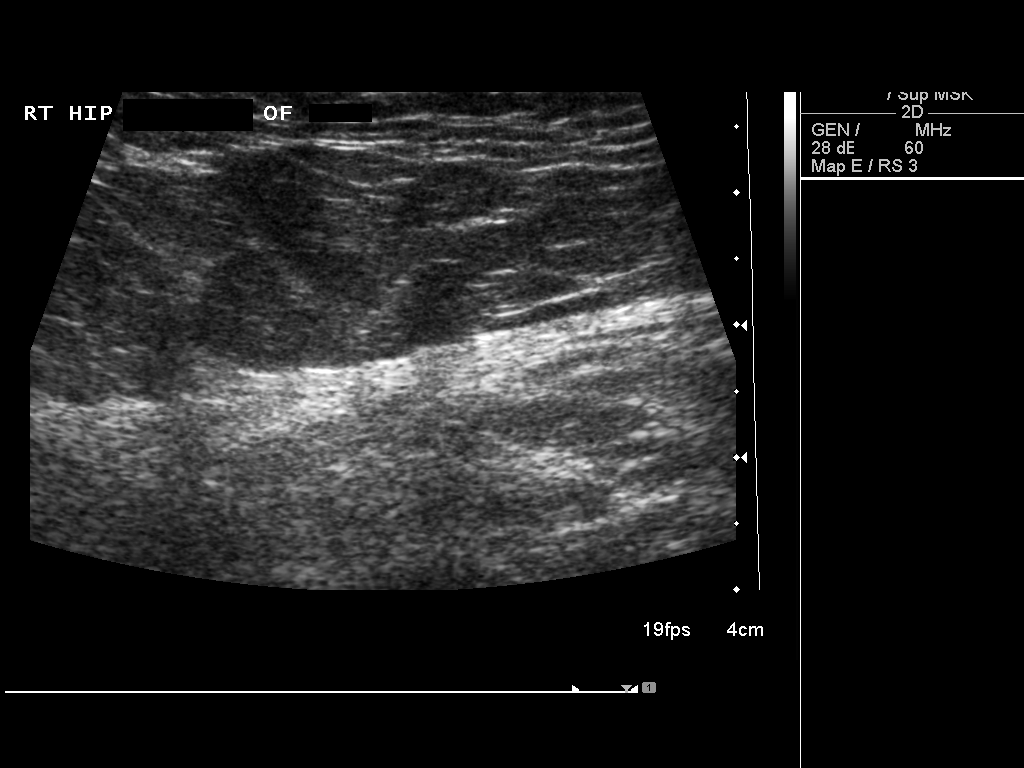
[im 6/6]
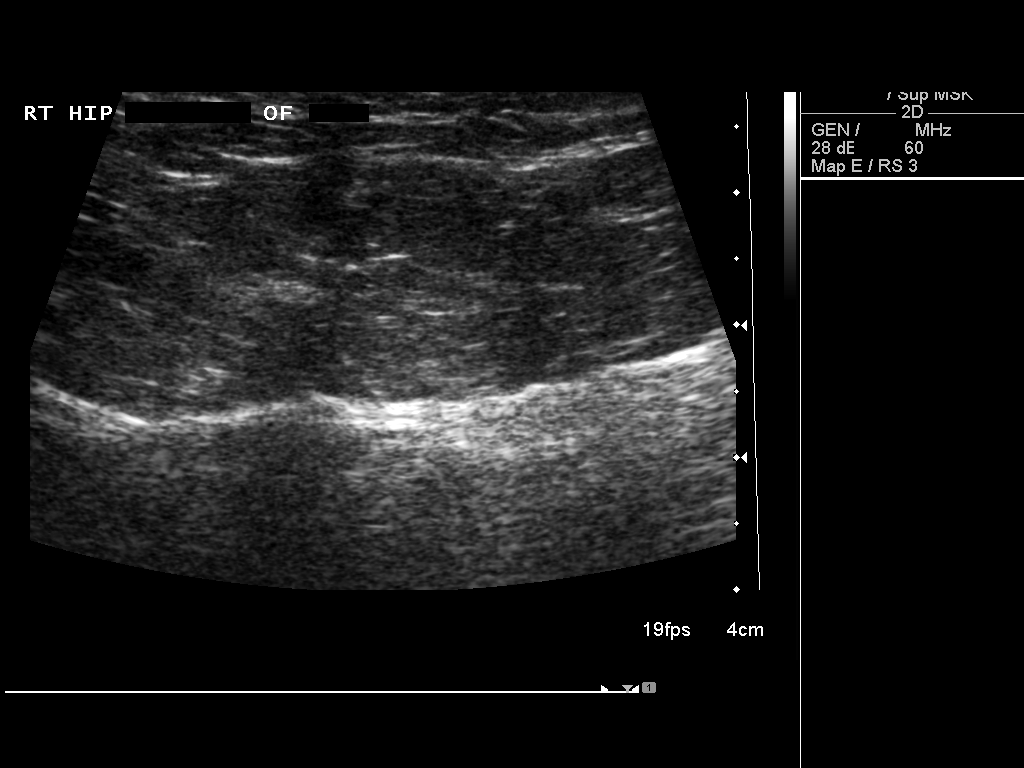

[6 of 6 positions shown; findings below may reference images not displayed]

FINDINGS: Ultrasound over the area in question was performed.  No
solid or cystic abnormality is noted.
IMPRESSION: Negative.

## 2013-10-27 DIAGNOSIS — C61 Malignant neoplasm of prostate: Secondary | ICD-10-CM | POA: Diagnosis not present

## 2013-11-02 DIAGNOSIS — Z79899 Other long term (current) drug therapy: Secondary | ICD-10-CM | POA: Diagnosis not present

## 2013-11-02 DIAGNOSIS — E291 Testicular hypofunction: Secondary | ICD-10-CM | POA: Diagnosis not present

## 2013-11-02 DIAGNOSIS — C61 Malignant neoplasm of prostate: Secondary | ICD-10-CM | POA: Diagnosis not present

## 2013-11-04 DIAGNOSIS — Z1211 Encounter for screening for malignant neoplasm of colon: Secondary | ICD-10-CM | POA: Diagnosis not present

## 2013-12-19 DIAGNOSIS — R946 Abnormal results of thyroid function studies: Secondary | ICD-10-CM | POA: Diagnosis not present

## 2013-12-22 DIAGNOSIS — L309 Dermatitis, unspecified: Secondary | ICD-10-CM | POA: Diagnosis not present

## 2014-02-15 ENCOUNTER — Other Ambulatory Visit: Payer: Self-pay | Admitting: Urology

## 2014-02-15 DIAGNOSIS — C61 Malignant neoplasm of prostate: Secondary | ICD-10-CM

## 2014-02-15 DIAGNOSIS — Z79899 Other long term (current) drug therapy: Secondary | ICD-10-CM

## 2014-04-18 DIAGNOSIS — C61 Malignant neoplasm of prostate: Secondary | ICD-10-CM | POA: Diagnosis not present

## 2014-04-18 DIAGNOSIS — E785 Hyperlipidemia, unspecified: Secondary | ICD-10-CM | POA: Diagnosis not present

## 2014-04-18 DIAGNOSIS — I10 Essential (primary) hypertension: Secondary | ICD-10-CM | POA: Diagnosis not present

## 2014-04-18 DIAGNOSIS — G4700 Insomnia, unspecified: Secondary | ICD-10-CM | POA: Diagnosis not present

## 2014-04-18 DIAGNOSIS — D696 Thrombocytopenia, unspecified: Secondary | ICD-10-CM | POA: Diagnosis not present

## 2014-04-18 DIAGNOSIS — R079 Chest pain, unspecified: Secondary | ICD-10-CM | POA: Diagnosis not present

## 2014-05-03 ENCOUNTER — Ambulatory Visit
Admission: RE | Admit: 2014-05-03 | Discharge: 2014-05-03 | Disposition: A | Discharge status: Home / Self Care | Payer: Medicare Other | Source: Ambulatory Visit | Attending: Urology | Admitting: Urology

## 2014-05-03 DIAGNOSIS — Z79899 Other long term (current) drug therapy: Secondary | ICD-10-CM

## 2014-05-03 DIAGNOSIS — Z13820 Encounter for screening for osteoporosis: Secondary | ICD-10-CM | POA: Diagnosis not present

## 2014-05-03 DIAGNOSIS — C61 Malignant neoplasm of prostate: Secondary | ICD-10-CM | POA: Diagnosis not present

## 2014-05-09 ENCOUNTER — Encounter (HOSPITAL_COMMUNITY): Payer: Self-pay

## 2014-05-09 ENCOUNTER — Other Ambulatory Visit (HOSPITAL_BASED_OUTPATIENT_CLINIC_OR_DEPARTMENT_OTHER): Payer: Medicare Other

## 2014-05-09 ENCOUNTER — Ambulatory Visit (HOSPITAL_COMMUNITY)
Admission: RE | Admit: 2014-05-09 | Discharge: 2014-05-09 | Disposition: A | Discharge status: Home / Self Care | Payer: Medicare Other | Source: Ambulatory Visit | Attending: Oncology | Admitting: Oncology

## 2014-05-09 DIAGNOSIS — C61 Malignant neoplasm of prostate: Secondary | ICD-10-CM | POA: Insufficient documentation

## 2014-05-09 DIAGNOSIS — I2510 Atherosclerotic heart disease of native coronary artery without angina pectoris: Secondary | ICD-10-CM | POA: Diagnosis not present

## 2014-05-09 DIAGNOSIS — K7689 Other specified diseases of liver: Secondary | ICD-10-CM | POA: Diagnosis not present

## 2014-05-09 DIAGNOSIS — Z8546 Personal history of malignant neoplasm of prostate: Secondary | ICD-10-CM | POA: Diagnosis not present

## 2014-05-09 DIAGNOSIS — Z08 Encounter for follow-up examination after completed treatment for malignant neoplasm: Secondary | ICD-10-CM | POA: Diagnosis not present

## 2014-05-09 DIAGNOSIS — R918 Other nonspecific abnormal finding of lung field: Secondary | ICD-10-CM | POA: Diagnosis not present

## 2014-05-09 LAB — CBC WITH DIFFERENTIAL/PLATELET
BASO%: 1 % (ref 0.0–2.0)
Basophils Absolute: 0.1 10*3/uL (ref 0.0–0.1)
EOS%: 2.9 % (ref 0.0–7.0)
Eosinophils Absolute: 0.2 10*3/uL (ref 0.0–0.5)
HCT: 40.2 % (ref 38.4–49.9)
HGB: 13.5 g/dL (ref 13.0–17.1)
LYMPH%: 38 % (ref 14.0–49.0)
MCH: 30.1 pg (ref 27.2–33.4)
MCHC: 33.6 g/dL (ref 32.0–36.0)
MCV: 89.5 fL (ref 79.3–98.0)
MONO#: 0.5 10*3/uL (ref 0.1–0.9)
MONO%: 9.1 % (ref 0.0–14.0)
NEUT#: 2.6 10*3/uL (ref 1.5–6.5)
NEUT%: 49 % (ref 39.0–75.0)
Platelets: 175 10*3/uL (ref 140–400)
RBC: 4.49 10*6/uL (ref 4.20–5.82)
RDW: 12.8 % (ref 11.0–14.6)
WBC: 5.2 10*3/uL (ref 4.0–10.3)
lymph#: 2 10*3/uL (ref 0.9–3.3)

## 2014-05-09 LAB — COMPREHENSIVE METABOLIC PANEL (CC13)
ALT: 19 U/L (ref 0–55)
AST: 22 U/L (ref 5–34)
Albumin: 3.9 g/dL (ref 3.5–5.0)
Alkaline Phosphatase: 47 U/L (ref 40–150)
Anion Gap: 6 mEq/L (ref 3–11)
BUN: 23.2 mg/dL (ref 7.0–26.0)
CO2: 26 mEq/L (ref 22–29)
Calcium: 9.3 mg/dL (ref 8.4–10.4)
Chloride: 108 mEq/L (ref 98–109)
Creatinine: 0.9 mg/dL (ref 0.7–1.3)
EGFR: 88 mL/min/{1.73_m2} — ABNORMAL LOW (ref >90)
Glucose: 98 mg/dl (ref 70–140)
Potassium: 3.9 mEq/L (ref 3.5–5.1)
Sodium: 140 mEq/L (ref 136–145)
Total Bilirubin: 0.46 mg/dL (ref 0.20–1.20)
Total Protein: 6.7 g/dL (ref 6.4–8.3)

## 2014-05-09 LAB — PSA: PSA: 0.08 ng/mL (ref <=4.00)

## 2014-05-09 MED ORDER — IOHEXOL 300 MG/ML  SOLN
100.0000 mL | Freq: Once | INTRAMUSCULAR | Status: AC | PRN
  Administered 2014-05-09: 100 mL via INTRAVENOUS

## 2014-05-10 DIAGNOSIS — C61 Malignant neoplasm of prostate: Secondary | ICD-10-CM | POA: Diagnosis not present

## 2014-05-10 DIAGNOSIS — E291 Testicular hypofunction: Secondary | ICD-10-CM | POA: Diagnosis not present

## 2014-05-11 ENCOUNTER — Telehealth: Payer: Self-pay | Admitting: Oncology

## 2014-05-11 ENCOUNTER — Encounter: Payer: Self-pay | Admitting: *Deleted

## 2014-05-11 ENCOUNTER — Ambulatory Visit (HOSPITAL_BASED_OUTPATIENT_CLINIC_OR_DEPARTMENT_OTHER): Payer: Medicare Other | Admitting: Oncology

## 2014-05-11 VITALS — BP 145/61 | HR 65 | Temp 98.5°F | Resp 18 | Ht 71.0 in | Wt 182.0 lb

## 2014-05-11 DIAGNOSIS — C61 Malignant neoplasm of prostate: Secondary | ICD-10-CM | POA: Diagnosis not present

## 2014-05-11 NOTE — Progress Notes (Signed)
Hematology and Oncology Follow Up Visit  [name] [id number] [birth year] 69 y.o. 05/11/2014 9:01 AM    Principle Diagnosis: 69 year old gentleman diagnosed with prostate cancer in November of 2012 presented with a PSA of 4.38 and found to have a Gleason score 4+3 equals 7, his clinical staging was T2c N1. 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    Prior Therapy: He presented with a PSA of 4.38 and subsequently underwent a radical prostatectomy and lymph node dissection on November of 2012. 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  He did not have any evidence of angiolymphatic invasion or extraprostatic extension. The margins were negative. He had one out of 12 lymph nodes involved with cancer predominantly in the right pelvic wall.  His post operative PSA remained at 0.65 and subsequently to 0.89. And in March of 2013 he was started on Lupron and have been on it since that time. His PSA remains very low 0.01   Current therapy: Lupron given every 6 months under the care of Dr. Alinda Money.  Interim History:  [name] presents today for a follow-up visit. Since the last visit, he has no complaints. 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 He did not report any hospitalization or illnesses. He did not report any pelvic pain or hematuria. He does not report any bone pain or constitutional symptoms. He has not reported any weight loss or appetite changes. Did not report any constitutional symptoms. Does not report any abdominal pain or satiety. Did not report any back pain shoulder pain or any bone discomfort. Has not reported any hematochezia or melena. He does report incontinence and erectile dysfunction. He does not report any headaches or blurry vision or syncope. He does not report any lymphadenopathy or petechiae. The remaining review  of systems is unremarkable.   Medications: I have reviewed the patient's current medications.  Current Outpatient Prescriptions  Medication Sig Dispense Refill  . ascorbic acid (VITAMIN C) 1000 MG tablet Take 1,000 mg by mouth 3 (three) times a week.    . benazepril-hydrochlorthiazide (LOTENSIN HCT) 20-12.5 MG per tablet Take 1 tablet by mouth every morning.      . Calcium Carbonate-Vitamin D (CALCIUM 600+D3 PO) Take 1 tablet by mouth daily. Patient takes 1-2 tablets daily    . diphenhydrAMINE (BENADRYL) 25 MG tablet Take 25 mg by mouth every 8 (eight) hours as needed. For allergies    . Eszopiclone (ESZOPICLONE) 3 MG TABS Take 3 mg by mouth at bedtime as needed. Take immediately before bedtime. Sleep     . Ibuprofen-Diphenhydramine Cit (ADVIL PM PO) Take 1 tablet by mouth at bedtime as needed.    . MULTIPLE VITAMIN PO Take 1 tablet by mouth daily.    . simvastatin (ZOCOR) 20 MG tablet Take 20 mg by mouth at bedtime.     . traZODone (DESYREL) 50 MG tablet Take 50 mg by mouth at bedtime as needed for sleep.     No current facility-administered medications for this visit.     Allergies: No Known Allergies  Past Medical History, Surgical history, Social history, and Family History were reviewed and updated.  Physical Exam: Blood pressure 145/61, pulse 65, temperature 98.5 F (36.9 C), temperature source Oral, resp. rate 18, height 5\' 11"  (1.803 m), weight 182 lb (82.555 kg), SpO2 100 %. ECOG: 0 General appearance: alert and cooperative Head: Normocephalic, without obvious abnormality Neck: no adenopathy  Lymph nodes: Cervical, supraclavicular, and axillary nodes normal. Heart:regular rate and rhythm, S1, S2 normal, no murmur, click, rub or gallop Lung:chest clear, no wheezing, rales, normal symmetric air entry Abdomin: soft, non-tender, without masses or organomegaly EXT:no erythema, induration, or nodules   Lab Results: Lab Results  Component Value Date   WBC 5.2 05/09/2014    HGB 13.5 05/09/2014   HCT 40.2 05/09/2014   MCV 89.5 05/09/2014   PLT 175 05/09/2014     Chemistry      Component Value Date/Time   NA 140 05/09/2014 0840   NA 133* 01/14/2011 0517   K 3.9 05/09/2014 0840   K 4.1 01/14/2011 0517   CL 101 01/14/2011 0517   CO2 26 05/09/2014 0840   CO2 26 01/14/2011 0517   BUN 23.2 05/09/2014 0840   BUN 15 01/14/2011 0517   CREATININE 0.9 05/09/2014 0840   CREATININE 0.86 01/14/2011 0517      Component Value Date/Time   CALCIUM 9.3 05/09/2014 0840   CALCIUM 8.6 01/14/2011 0517   ALKPHOS 47 05/09/2014 0840   AST 22 05/09/2014 0840   ALT 19 05/09/2014 0840   BILITOT 0.46 05/09/2014 0840     Results for Nettleton, Azriel L (MRN [id number]) as of 05/11/2014 08:29  Ref. Range 03/10/2013 10:45 09/05/2013 08:08 05/09/2014 08:41  PSA Latest Range: <=4.00 ng/mL 0.01 0.04 0.08    Radiological Studies: Ct Chest W Contrast  05/09/2014   CLINICAL DATA:  Subsequent evaluation of a 69 year old male with history prostate cancer diagnosed 2012. Follow-up examination.  EXAM: CT CHEST, ABDOMEN, AND PELVIS WITH CONTRAST  TECHNIQUE: Multidetector CT imaging of the chest, abdomen and pelvis was performed following the standard protocol during bolus administration of intravenous contrast.  CONTRAST:  179mL OMNIPAQUE IOHEXOL 300 MG/ML  SOLN  COMPARISON:  CT of the chest, abdomen and pelvis 09/05/2013.  FINDINGS: CT CHEST FINDINGS  Mediastinum/Lymph Nodes: Heart size is normal. There is no significant pericardial fluid, thickening or pericardial calcification. There is atherosclerosis of the thoracic aorta, the great vessels of the mediastinum and the coronary arteries, including calcified atherosclerotic plaque in the left anterior descending, left circumflex and right coronary arteries. No pathologically enlarged mediastinal or hilar lymph nodes. Esophagus is unremarkable in appearance. No axillary lymphadenopathy.  Lungs/Pleura: A few tiny pulmonary nodules are noted, and are  unchanged in size, number and distribution compared to the prior study, with the largest of these nodules measuring 4 mm in the subpleural aspect of the right lower lobe adjacent to the major fissure (image 29 of series 4). No new suspicious appearing pulmonary nodules or masses are otherwise noted. No acute consolidative airspace disease. No pleural effusions.  Musculoskeletal/Soft Tissues: There are no aggressive appearing lytic or blastic lesions noted in the visualized portions of the skeleton.  CT ABDOMEN AND PELVIS FINDINGS  Hepatobiliary: 4 mm low attenuation lesion in segment 4A of the liver (image 49 of series 2) and 4 mm low attenuation lesion in segment 5 of the liver (image 61 of series 2) are both unchanged in retrospect compared to the prior examination, and although too small to definitively characterize, are favored to be benign lesions, likely tiny cysts. There is also a tiny 2 mm low attenuation lesion in segment 8 of the liver (image 56 of series 2), which cannot be confirmed on prior examinations, but is very well-defined and although not characterized on today's examination due to its small size is favored to be benign, likely a small cyst. No larger more  suspicious appearing hepatic lesions. No intra or extrahepatic biliary ductal dilatation. Gallbladder is normal in appearance.  Pancreas: Unremarkable.  Spleen: Unremarkable.  Adrenals/Urinary Tract: Exophytic 11 mm low-attenuation lesions in the interpolar and lower pole regions of the left kidney are unchanged, compatible with small simple cysts. Sub cm low-attenuation lesion in the medial aspect of the interpolar region of the right kidney is too small to characterize, but is similar to the prior study, presumably a small cyst. No hydroureteronephrosis. Again noted is a small area of high density in the inferior aspect of the urinary bladder near the orifice of the urethral orifice, which is stable in size and appearance compared to the  prior study, favored to be postoperative change (less likely a bladder calculus). Bilateral adrenal glands are normal in appearance.  Stomach/Bowel: The appearance of the stomach is normal. No pathologic dilatation of small bowel or colon.  Vascular/Lymphatic: Extensive atherosclerosis throughout the abdominal and pelvic vasculature, without evidence of aneurysm or dissection. No lymphadenopathy noted in the abdomen or pelvis.  Reproductive: Status post radical prostatectomy. No soft tissue mass noted in the prostatectomy bed to suggest local recurrence of disease.  Other: No significant volume of ascites.  No pneumoperitoneum.  Musculoskeletal: There are no aggressive appearing lytic or blastic lesions noted in the visualized portions of the skeleton.  IMPRESSION: 1. No findings to suggest metastatic disease to the chest, abdomen or pelvis. No evidence of recurrent disease in the pelvis. 2. Multiple tiny liver lesions, as above. Two of these are stable compared to the prior examination, while one of them is new. These are all too small to definitively characterize, but are favored to be benign. Attention on routine followup examinations is recommended to ensure stability. 3. Unchanged tiny pulmonary nodules, largest of which measures only 4 mm in the subpleural aspect of the right lower lobe. These are favored to be benign. 4. Atherosclerosis, including three vessel coronary artery disease. Please note that although the presence of coronary artery calcium documents the presence of coronary artery disease, the severity of this disease and any potential stenosis cannot be assessed on this non-gated CT examination. Assessment for potential risk factor modification, dietary therapy or pharmacologic therapy may be warranted, if clinically indicated. 5. Additional incidental findings, as above.   Electronically Signed   By: Vinnie Langton M.D.   On: 05/09/2014 12:11   Ct Abdomen Pelvis W Contrast  05/09/2014    CLINICAL DATA:  Subsequent evaluation of a 69 year old male with history prostate cancer diagnosed 2012. Follow-up examination.  EXAM: CT CHEST, ABDOMEN, AND PELVIS WITH CONTRAST  TECHNIQUE: Multidetector CT imaging of the chest, abdomen and pelvis was performed following the standard protocol during bolus administration of intravenous contrast.  CONTRAST:  194mL OMNIPAQUE IOHEXOL 300 MG/ML  SOLN  COMPARISON:  CT of the chest, abdomen and pelvis 09/05/2013.  FINDINGS: CT CHEST FINDINGS  Mediastinum/Lymph Nodes: Heart size is normal. There is no significant pericardial fluid, thickening or pericardial calcification. There is atherosclerosis of the thoracic aorta, the great vessels of the mediastinum and the coronary arteries, including calcified atherosclerotic plaque in the left anterior descending, left circumflex and right coronary arteries. No pathologically enlarged mediastinal or hilar lymph nodes. Esophagus is unremarkable in appearance. No axillary lymphadenopathy.  Lungs/Pleura: A few tiny pulmonary nodules are noted, and are unchanged in size, number and distribution compared to the prior study, with the largest of these nodules measuring 4 mm in the subpleural aspect of the right lower lobe adjacent to the  major fissure (image 29 of series 4). No new suspicious appearing pulmonary nodules or masses are otherwise noted. No acute consolidative airspace disease. No pleural effusions.  Musculoskeletal/Soft Tissues: There are no aggressive appearing lytic or blastic lesions noted in the visualized portions of the skeleton.  CT ABDOMEN AND PELVIS FINDINGS  Hepatobiliary: 4 mm low attenuation lesion in segment 4A of the liver (image 49 of series 2) and 4 mm low attenuation lesion in segment 5 of the liver (image 61 of series 2) are both unchanged in retrospect compared to the prior examination, and although too small to definitively characterize, are favored to be benign lesions, likely tiny cysts. There is also  a tiny 2 mm low attenuation lesion in segment 8 of the liver (image 56 of series 2), which cannot be confirmed on prior examinations, but is very well-defined and although not characterized on today's examination due to its small size is favored to be benign, likely a small cyst. No larger more suspicious appearing hepatic lesions. No intra or extrahepatic biliary ductal dilatation. Gallbladder is normal in appearance.  Pancreas: Unremarkable.  Spleen: Unremarkable.  Adrenals/Urinary Tract: Exophytic 11 mm low-attenuation lesions in the interpolar and lower pole regions of the left kidney are unchanged, compatible with small simple cysts. Sub cm low-attenuation lesion in the medial aspect of the interpolar region of the right kidney is too small to characterize, but is similar to the prior study, presumably a small cyst. No hydroureteronephrosis. Again noted is a small area of high density in the inferior aspect of the urinary bladder near the orifice of the urethral orifice, which is stable in size and appearance compared to the prior study, favored to be postoperative change (less likely a bladder calculus). Bilateral adrenal glands are normal in appearance.  Stomach/Bowel: The appearance of the stomach is normal. No pathologic dilatation of small bowel or colon.  Vascular/Lymphatic: Extensive atherosclerosis throughout the abdominal and pelvic vasculature, without evidence of aneurysm or dissection. No lymphadenopathy noted in the abdomen or pelvis.  Reproductive: Status post radical prostatectomy. No soft tissue mass noted in the prostatectomy bed to suggest local recurrence of disease.  Other: No significant volume of ascites.  No pneumoperitoneum.  Musculoskeletal: There are no aggressive appearing lytic or blastic lesions noted in the visualized portions of the skeleton.  IMPRESSION: 1. No findings to suggest metastatic disease to the chest, abdomen or pelvis. No evidence of recurrent disease in the pelvis.  2. Multiple tiny liver lesions, as above. Two of these are stable compared to the prior examination, while one of them is new. These are all too small to definitively characterize, but are favored to be benign. Attention on routine followup examinations is recommended to ensure stability. 3. Unchanged tiny pulmonary nodules, largest of which measures only 4 mm in the subpleural aspect of the right lower lobe. These are favored to be benign. 4. Atherosclerosis, including three vessel coronary artery disease. Please note that although the presence of coronary artery calcium documents the presence of coronary artery disease, the severity of this disease and any potential stenosis cannot be assessed on this non-gated CT examination. Assessment for potential risk factor modification, dietary therapy or pharmacologic therapy may be warranted, if clinically indicated. 5. Additional incidental findings, as above.   Electronically Signed   By: Vinnie Langton M.D.   On: 05/09/2014 12:11     Impression and Plan:  69 year old gentleman with the following issues:  1. Prostate cancer diagnosed in November of 2012 presented with  a PSA of 4.38 and found to have a Gleason score 4+3 equals 7, his clinical staging was T2c N1. He is status post robotic prostatectomy and lymphadenectomy with one of 12 lymph nodes were involved. He continued to have elevated PSA that is detectable around 0.68 and 0.84 and had been on Lupron since March of 2013.  His most recent PSA 0.08 on 05/09/2014. Imaging studies did not show any evidence of metastatic disease. My recommendation at this time, is to continue with androgen deprivation only with Lupron and continue to monitor PSA periodically. Annual CT scan will be important given his neuroendocrine differentiation which my not produce PSA. I will set that up for him to have a follow-up and a CT scan in one year. Certainly I will be happy to see him sooner if there is any issues.  2.  Androgen depravation: He is to continue Lupron with Dr. Alinda Money has prescribed.  3. Bone health: I agree with calcium and vitamin D and repeat bone density frequently.  [facility] MD 3/17/20169:01 AM

## 2014-05-11 NOTE — Telephone Encounter (Signed)
Pt confirmed labs/ov per 03/17 POF, gave pt AVS and calendar..... KJ  °

## 2014-05-12 ENCOUNTER — Encounter: Payer: Self-pay | Admitting: Internal Medicine

## 2014-05-12 ENCOUNTER — Ambulatory Visit (INDEPENDENT_AMBULATORY_CARE_PROVIDER_SITE_OTHER): Payer: Medicare Other | Admitting: Internal Medicine

## 2014-05-12 VITALS — BP 120/74 | HR 58 | Ht 71.0 in | Wt 181.8 lb

## 2014-05-12 DIAGNOSIS — R079 Chest pain, unspecified: Secondary | ICD-10-CM

## 2014-05-12 DIAGNOSIS — R030 Elevated blood-pressure reading, without diagnosis of hypertension: Secondary | ICD-10-CM

## 2014-05-12 DIAGNOSIS — I2510 Atherosclerotic heart disease of native coronary artery without angina pectoris: Secondary | ICD-10-CM | POA: Diagnosis not present

## 2014-05-12 NOTE — Patient Instructions (Signed)
Your physician has requested that you have en exercise stress myoview. For further information please visit [email or web address]. Please follow instruction sheet, as given.  Your physician wants you to follow-up in: 1 year with Dr. Harrington Challenger.  You will receive a reminder letter in the mail two months in advance. If you don't receive a letter, please call our office to schedule the follow-up appointment.

## 2014-05-12 NOTE — Progress Notes (Signed)
Cardiology Office Note   Date:  05/12/2014   ID:  Tabb, Croghan 1945-06-21, MRN [id number]  PCP:  Gara Kroner, MD  Cardiologist:   Dorris Carnes, MD   Chief Complaint  Patient presents with  . atypical chest pain, left side      History of Present Illness: [name] is a 69 y.o. male referred for CP He was seen by Iline Oven.    Patinet had pain on collar bone on 2/23  Couldnt get relief  At same time bottom of L rib cage pain  Occurred while sitting    Took advill  Didn't stop  Almost went to ER  But eased off  Slept fine  Next am did hurt a litte bit  Also in PM SInce then no pain  Works at home on computer.  Walks daily for 25 min  Tries to do some weight training   Being treated for prostate CA      Current Outpatient Prescriptions  Medication Sig Dispense Refill  . ascorbic acid (VITAMIN C) 1000 MG tablet Take 1,000 mg by mouth 3 (three) times a week.    . benazepril-hydrochlorthiazide (LOTENSIN HCT) 20-12.5 MG per tablet Take 1 tablet by mouth every morning.      . Calcium Carbonate-Vitamin D (CALCIUM 600+D3 PO) Take 1 tablet by mouth daily. Patient takes 1-2 tablets daily    . diphenhydrAMINE (BENADRYL) 25 MG tablet Take 25 mg by mouth every 8 (eight) hours as needed. For allergies    . Ibuprofen-Diphenhydramine Cit (ADVIL PM PO) Take 1 tablet by mouth at bedtime as needed.    . MULTIPLE VITAMIN PO Take 1 tablet by mouth daily.    . simvastatin (ZOCOR) 20 MG tablet Take 20 mg by mouth at bedtime.      No current facility-administered medications for this visit.    Allergies:   Review of patient's allergies indicates no known allergies.   Past Medical History  Diagnosis Date  . Hypertension   . Recurrent upper respiratory infection (URI)     pt had a cold recent- week ago - better now   . Cancer     prostate cancer   . Arthritis     hands     Past Surgical History  Procedure Laterality Date  . Other surgical history  2005    right hand middle  finger surgery   . Robot assisted laparoscopic radical prostatectomy  01/13/2011    Procedure: ROBOTIC ASSISTED LAPAROSCOPIC RADICAL PROSTATECTOMY LEVEL 2;  Surgeon: Dutch Gray, MD;  Location: [facility];  Service: Urology;  Laterality: Bilateral;  Robotic Assisted Laparoscopic Prostatetectomy with Bilateral Lymphadenectomy  Level 2     Social History:  The patient  reports that he has never smoked. He has never used smokeless tobacco. He reports that he drinks about 2.4 oz of alcohol per week. He reports that he does not use illicit drugs.   Family History:  The patient's family history includes Cancer in his mother; Heart attack in his father; Hypertension in his father and sister.    ROS:  Please see the history of present illness. All other systems are reviewed and  Negative to the above problem except as noted.    PHYSICAL EXAM: VS:  BP 120/74 mmHg  Pulse 58  Ht 5\' 11"  (1.803 m)  Wt 181 lb 12.8 oz (82.464 kg)  BMI 25.37 kg/m2  GEN: Well nourished, well developed, in no acute distress HEENT: normal Neck: no JVD,  carotid bruits, or masses Cardiac: RRR; no murmurs, rubs, or gallops,no edema  Respiratory:  clear to auscultation bilaterally, normal work of breathing GI: soft, nontender, nondistended, + BS  No hepatomegaly  MS: no deformity Moving all extremities   Skin: warm and dry, no rash Neuro:  Strength and sensation are intact Psych: euthymic mood, full affect   EKG:  EKG is ordered today.  Sinus bradycardia 58 bpm    Lipid Panel No results found for: CHOL, TRIG, HDL, CHOLHDL, VLDL, LDLCALC, LDLDIRECT    Wt Readings from Last 3 Encounters:  05/12/14 181 lb 12.8 oz (82.464 kg)  05/11/14 182 lb (82.555 kg)  09/08/13 187 lb (84.823 kg)      ASSESSMENT AND PLAN: 1  Chest pain  Recent episode is atypcial    He has had a CT for cancer follow up  This showed cacifications of the coronary arteries.    Given that he does not do that much aerobic activity and he does note some  fatiuge, I would recomm a stress myoview.   He should continue on ASA 81 mg    2.  HL  Contiue simvistatin.  LDL 63      Current medicines are reviewed at length with the patient today.  The patient does not have concerns regarding medicines.  The following changes have been made:   Labs/ tests ordered today include:  Stress myoview No orders of the defined types were placed in this encounter.     Disposition:   FU with me in 1 year, sooner if symtaoms change    Signed, Dorris Carnes, MD  05/12/2014 [facility] [place], [place], [place]  [address] Phone: [phone]; Fax: [phone]

## 2014-05-24 ENCOUNTER — Ambulatory Visit (HOSPITAL_COMMUNITY): Payer: Medicare Other | Attending: Cardiovascular Disease | Admitting: Radiology

## 2014-05-24 DIAGNOSIS — R079 Chest pain, unspecified: Secondary | ICD-10-CM

## 2014-05-24 DIAGNOSIS — I2510 Atherosclerotic heart disease of native coronary artery without angina pectoris: Secondary | ICD-10-CM | POA: Insufficient documentation

## 2014-05-24 DIAGNOSIS — R030 Elevated blood-pressure reading, without diagnosis of hypertension: Secondary | ICD-10-CM | POA: Diagnosis not present

## 2014-05-24 MED ORDER — TECHNETIUM TC 99M SESTAMIBI GENERIC - CARDIOLITE
33.0000 | Freq: Once | INTRAVENOUS | Status: AC | PRN
  Administered 2014-05-24: 33 via INTRAVENOUS

## 2014-05-24 MED ORDER — TECHNETIUM TC 99M SESTAMIBI GENERIC - CARDIOLITE
11.0000 | Freq: Once | INTRAVENOUS | Status: AC | PRN
  Administered 2014-05-24: 11 via INTRAVENOUS

## 2014-05-24 NOTE — Progress Notes (Signed)
[facility] [address] [place], [place] [address] [phone]    Cardiology Nuclear Med Study  [name] is a 69 y.o. male     MRN : [id number]     DOB: Jun 28, 1945  Procedure Date: 05/24/2014  Nuclear Med Background Indication for Stress Test:  Evaluation for Ischemia, and 05-09-2014 CT Chest: Coronary Calcifications History:  No known CAD Cardiac Risk Factors: Strong,premature Family History - CAD and Hypertension  Symptoms:  Chest Pain (last date of chest discomfort was two weeks ago)   Nuclear Pre-Procedure Caffeine/Decaff Intake:  None> 12 hrs NPO After: 12:30am   Lungs:  clear O2 Sat: 96% on room air. IV 0.9% NS with Angio Cath:  22g  IV Site: R Antecubital x 1, tolerated well IV Started by:  Irven Baltimore, RN  Chest Size (in):  44 Cup Size: n/a  Height: 5\' 11"  (1.803 m)  Weight:  179 lb (81.194 kg)  BMI:  Body mass index is 24.98 kg/(m^2). Tech Comments:  Patient took Lotensin this am. Irven Baltimore, RN.    Nuclear Med Study 1 or 2 day study: 1 day  Stress Test Type:  Stress  Reading MD: N/A  Order Authorizing Provider:  Dorris Carnes, MD  Resting Radionuclide: Technetium 34m Sestamibi  Resting Radionuclide Dose: 11.0 mCi   Stress Radionuclide:  Technetium 75m Sestamibi  Stress Radionuclide Dose: 33.0 mCi           Stress Protocol Rest HR: 51 Stress HR: 137  Rest BP: 122/67 Stress BP: 215/63  Exercise Time (min): 9:00 METS: 10.1   Predicted Max HR: 153 bpm % Max HR: 89.54 bpm Rate Pressure Product: 42595   Dose of Adenosine (mg):  n/a Dose of Lexiscan: n/a mg  Dose of Atropine (mg): n/a Dose of Dobutamine: n/a mcg/kg/min (at max HR)  Stress Test Technologist: Glade Lloyd, BS-ES  Nuclear Technologist:  Earl Many, CNMT     Rest Procedure:  Myocardial perfusion imaging was performed at rest 45 minutes following the intravenous administration of Technetium 65m Sestamibi. Rest ECG: NSR - Normal EKG  Stress Procedure:   The patient exercised on the treadmill utilizing the Bruce Protocol for 9:00 minutes. The patient stopped due to fatigue and denied any chest pain.  Technetium 53m Sestamibi was injected at peak exercise and myocardial perfusion imaging was performed after a brief delay. Stress ECG: No significant change from baseline ECG  QPS Raw Data Images:  Normal; no motion artifact; normal heart/lung ratio. Stress Images:  Normal homogeneous uptake in all areas of the myocardium. Rest Images:  Normal homogeneous uptake in all areas of the myocardium. Subtraction (SDS):  There is no evidence of scar or ischemia. Transient Ischemic Dilatation (Normal <1.22):  0.89 Lung/Heart Ratio (Normal <0.45):  0.28  Quantitative Gated Spect Images QGS EDV:  98 ml QGS ESV:  40 ml  Impression Exercise Capacity:  Good exercise capacity. BP Response:  Hypertensive blood pressure response. Clinical Symptoms:  Fatigue, no chest pain. ECG Impression:  No significant ST segment change suggestive of ischemia. Comparison with Prior Nuclear Study: No images to compare  Overall Impression:  Normal stress nuclear study.  LV Ejection Fraction: 59%.  LV Wall Motion:  NL LV Function; NL Wall Motion\  Loralie Champagne 05/24/2014

## 2014-05-31 ENCOUNTER — Telehealth: Payer: Self-pay | Admitting: Internal Medicine

## 2014-05-31 NOTE — Telephone Encounter (Signed)
New Message    Patient is calling to know the results to his stress test that he took last week. Please give a call back.

## 2014-05-31 NOTE — Telephone Encounter (Signed)
-----   Message from Fay Records, MD sent at 05/27/2014  6:11 PM EDT ----- Stress test is normal. No evidence of ischemia Keep on same medicines.  Make sure he is in for call back in 1year at latest

## 2014-05-31 NOTE — Telephone Encounter (Signed)
Advised patient, verbalized understanding  

## 2014-05-31 NOTE — Telephone Encounter (Signed)
**Note De-identified [name] Obfuscation** LMTCB

## 2014-06-20 ENCOUNTER — Other Ambulatory Visit: Payer: Self-pay | Admitting: Dermatology

## 2014-06-20 DIAGNOSIS — D485 Neoplasm of uncertain behavior of skin: Secondary | ICD-10-CM | POA: Diagnosis not present

## 2014-06-20 DIAGNOSIS — L820 Inflamed seborrheic keratosis: Secondary | ICD-10-CM | POA: Diagnosis not present

## 2014-06-20 DIAGNOSIS — Z85828 Personal history of other malignant neoplasm of skin: Secondary | ICD-10-CM | POA: Diagnosis not present

## 2014-06-20 DIAGNOSIS — L812 Freckles: Secondary | ICD-10-CM | POA: Diagnosis not present

## 2014-06-20 DIAGNOSIS — D3612 Benign neoplasm of peripheral nerves and autonomic nervous system, upper limb, including shoulder: Secondary | ICD-10-CM | POA: Diagnosis not present

## 2014-06-20 DIAGNOSIS — L821 Other seborrheic keratosis: Secondary | ICD-10-CM | POA: Diagnosis not present

## 2014-06-20 DIAGNOSIS — L570 Actinic keratosis: Secondary | ICD-10-CM | POA: Diagnosis not present

## 2014-06-20 DIAGNOSIS — D1801 Hemangioma of skin and subcutaneous tissue: Secondary | ICD-10-CM | POA: Diagnosis not present

## 2014-06-20 DIAGNOSIS — D2361 Other benign neoplasm of skin of right upper limb, including shoulder: Secondary | ICD-10-CM | POA: Diagnosis not present

## 2014-08-10 DIAGNOSIS — C61 Malignant neoplasm of prostate: Secondary | ICD-10-CM | POA: Diagnosis not present

## 2014-08-21 ENCOUNTER — Other Ambulatory Visit: Payer: Self-pay

## 2014-10-26 DIAGNOSIS — D696 Thrombocytopenia, unspecified: Secondary | ICD-10-CM | POA: Diagnosis not present

## 2014-10-26 DIAGNOSIS — Z1389 Encounter for screening for other disorder: Secondary | ICD-10-CM | POA: Diagnosis not present

## 2014-10-26 DIAGNOSIS — E785 Hyperlipidemia, unspecified: Secondary | ICD-10-CM | POA: Diagnosis not present

## 2014-10-26 DIAGNOSIS — I10 Essential (primary) hypertension: Secondary | ICD-10-CM | POA: Diagnosis not present

## 2014-10-26 DIAGNOSIS — G4700 Insomnia, unspecified: Secondary | ICD-10-CM | POA: Diagnosis not present

## 2014-10-26 DIAGNOSIS — C61 Malignant neoplasm of prostate: Secondary | ICD-10-CM | POA: Diagnosis not present

## 2014-10-26 DIAGNOSIS — Z23 Encounter for immunization: Secondary | ICD-10-CM | POA: Diagnosis not present

## 2014-11-29 DIAGNOSIS — C61 Malignant neoplasm of prostate: Secondary | ICD-10-CM | POA: Diagnosis not present

## 2014-12-06 DIAGNOSIS — C61 Malignant neoplasm of prostate: Secondary | ICD-10-CM | POA: Diagnosis not present

## 2014-12-22 DIAGNOSIS — C61 Malignant neoplasm of prostate: Secondary | ICD-10-CM | POA: Diagnosis not present

## 2014-12-22 DIAGNOSIS — E785 Hyperlipidemia, unspecified: Secondary | ICD-10-CM | POA: Diagnosis not present

## 2014-12-22 DIAGNOSIS — Z1211 Encounter for screening for malignant neoplasm of colon: Secondary | ICD-10-CM | POA: Diagnosis not present

## 2014-12-22 DIAGNOSIS — G4700 Insomnia, unspecified: Secondary | ICD-10-CM | POA: Diagnosis not present

## 2014-12-22 DIAGNOSIS — F430 Acute stress reaction: Secondary | ICD-10-CM | POA: Diagnosis not present

## 2014-12-22 DIAGNOSIS — Z0000 Encounter for general adult medical examination without abnormal findings: Secondary | ICD-10-CM | POA: Diagnosis not present

## 2014-12-22 DIAGNOSIS — D696 Thrombocytopenia, unspecified: Secondary | ICD-10-CM | POA: Diagnosis not present

## 2014-12-22 DIAGNOSIS — I10 Essential (primary) hypertension: Secondary | ICD-10-CM | POA: Diagnosis not present

## 2015-01-01 DIAGNOSIS — Z1211 Encounter for screening for malignant neoplasm of colon: Secondary | ICD-10-CM | POA: Diagnosis not present

## 2015-01-31 DIAGNOSIS — Z961 Presence of intraocular lens: Secondary | ICD-10-CM | POA: Diagnosis not present

## 2015-04-11 DIAGNOSIS — C61 Malignant neoplasm of prostate: Secondary | ICD-10-CM | POA: Diagnosis not present

## 2015-04-18 DIAGNOSIS — C61 Malignant neoplasm of prostate: Secondary | ICD-10-CM | POA: Diagnosis not present

## 2015-04-18 DIAGNOSIS — Z0000 Encounter for general adult medical examination without abnormal findings: Secondary | ICD-10-CM | POA: Diagnosis not present

## 2015-04-27 ENCOUNTER — Encounter: Payer: Self-pay | Admitting: Internal Medicine

## 2015-04-27 ENCOUNTER — Ambulatory Visit (INDEPENDENT_AMBULATORY_CARE_PROVIDER_SITE_OTHER): Payer: Medicare Other | Admitting: Internal Medicine

## 2015-04-27 VITALS — BP 130/74 | HR 68 | Ht 71.0 in | Wt 187.0 lb

## 2015-04-27 DIAGNOSIS — E785 Hyperlipidemia, unspecified: Secondary | ICD-10-CM

## 2015-04-27 NOTE — Progress Notes (Signed)
Cardiology Office Note   Date:  04/27/2015   ID:  [name], [name] 05-22-1945, MRN [id number]  PCP:  Gara Kroner, MD  Cardiologist:   Dorris Carnes, MD    F/U of lipids and CP    History of Present Illness: [name] is a 70 y.o. male with a history of CP and hyperlipidemia He has mild arterial calcifications on CT  Since I saw him he has been doing pretty good.     No CP  Breathing is OK   Has been active until 2 months ago SLowed down on physical acitivty because he has been  Busy with work       Outpatient Prescriptions Prior to Visit  Medication Sig Dispense Refill  . ascorbic acid (VITAMIN C) 1000 MG tablet Take 1,000 mg by mouth 3 (three) times a week.    Marland Kitchen aspirin EC 81 MG tablet Take 81 mg by mouth daily.    . benazepril-hydrochlorthiazide (LOTENSIN HCT) 20-12.5 MG per tablet Take 1 tablet by mouth every morning.      . Calcium Carbonate-Vitamin D (CALCIUM 600+D3 PO) Take 1 tablet by mouth daily. Patient takes 1-2 tablets daily    . diphenhydrAMINE (BENADRYL) 25 MG tablet Take 25 mg by mouth every 8 (eight) hours as needed. For allergies    . Ibuprofen-Diphenhydramine Cit (ADVIL PM PO) Take 1 tablet by mouth at bedtime as needed.    . MULTIPLE VITAMIN PO Take 1 tablet by mouth daily.    . simvastatin (ZOCOR) 20 MG tablet Take 20 mg by mouth at bedtime.      No facility-administered medications prior to visit.     Allergies:   Review of patient's allergies indicates no known allergies.   Past Medical History  Diagnosis Date  . Hypertension   . Recurrent upper respiratory infection (URI)     pt had a cold recent- week ago - better now   . Cancer [facility])     prostate cancer   . Arthritis     hands     Past Surgical History  Procedure Laterality Date  . Other surgical history  2005    right hand middle finger surgery   . Robot assisted laparoscopic radical prostatectomy  01/13/2011    Procedure: ROBOTIC ASSISTED LAPAROSCOPIC RADICAL PROSTATECTOMY LEVEL  2;  Surgeon: Dutch Gray, MD;  Location: [facility];  Service: Urology;  Laterality: Bilateral;  Robotic Assisted Laparoscopic Prostatetectomy with Bilateral Lymphadenectomy  Level 2     Social History:  The patient  reports that he has never smoked. He has never used smokeless tobacco. He reports that he drinks about 2.4 oz of alcohol per week. He reports that he does not use illicit drugs.   Family History:  The patient's family history includes Cancer in his mother; Heart attack in his father; Hypertension in his father and sister.    ROS:  Please see the history of present illness. All other systems are reviewed and  Negative to the above problem except as noted.    PHYSICAL EXAM: VS:  BP 130/74 mmHg  Pulse 68  Ht 5\' 11"  (1.803 m)  Wt 187 lb (84.823 kg)  BMI 26.09 kg/m2  GEN: Well nourished, well developed, in no acute distress HEENT: normal Neck: no JVD, carotid bruits, or masses Cardiac: RRR; no murmurs, rubs, or gallops,no edema  Respiratory:  clear to auscultation bilaterally, normal work of breathing GI: soft, nontender, nondistended, + BS  No hepatomegaly  MS: no  deformity Moving all extremities   Skin: warm and dry, no rash Neuro:  Strength and sensation are intact Psych: euthymic mood, full affect   EKG:  EKG is ordered today.  SR 68     Lipid Panel No results found for: CHOL, TRIG, HDL, CHOLHDL, VLDL, LDLCALC, LDLDIRECT    Wt Readings from Last 3 Encounters:  04/27/15 187 lb (84.823 kg)  05/24/14 179 lb (81.194 kg)  05/12/14 181 lb 12.8 oz (82.464 kg)      ASSESSMENT AND PLAN: 1  CP Pt denies  Encouraged him to increase his activity    2.  HL  Will get labs from Dr Marquette Saa office.  Ct scan in past showed mild calcificaitons on arteries  Treat lipids aggressively   Disposition:   FU with me in 1 year    Signed, Dorris Carnes, MD  04/27/2015 3:31 PM    [facility] [address], [place], [place]  [address] Phone: [phone]; Fax: [phone]

## 2015-05-01 NOTE — Addendum Note (Signed)
Addended by: Freada Bergeron on: 05/01/2015 05:14 PM   Modules accepted: Orders

## 2015-05-09 ENCOUNTER — Other Ambulatory Visit (HOSPITAL_BASED_OUTPATIENT_CLINIC_OR_DEPARTMENT_OTHER): Payer: Medicare Other

## 2015-05-09 ENCOUNTER — Other Ambulatory Visit: Payer: Self-pay | Admitting: *Deleted

## 2015-05-09 ENCOUNTER — Ambulatory Visit (HOSPITAL_COMMUNITY)
Admission: RE | Admit: 2015-05-09 | Discharge: 2015-05-09 | Disposition: A | Discharge status: Home / Self Care | Payer: Medicare Other | Source: Ambulatory Visit | Attending: Oncology | Admitting: Oncology

## 2015-05-09 ENCOUNTER — Encounter: Payer: Self-pay | Admitting: *Deleted

## 2015-05-09 ENCOUNTER — Encounter (HOSPITAL_COMMUNITY): Payer: Self-pay

## 2015-05-09 DIAGNOSIS — C61 Malignant neoplasm of prostate: Secondary | ICD-10-CM

## 2015-05-09 DIAGNOSIS — Z9889 Other specified postprocedural states: Secondary | ICD-10-CM | POA: Diagnosis not present

## 2015-05-09 DIAGNOSIS — R918 Other nonspecific abnormal finding of lung field: Secondary | ICD-10-CM | POA: Insufficient documentation

## 2015-05-09 DIAGNOSIS — I700 Atherosclerosis of aorta: Secondary | ICD-10-CM | POA: Diagnosis not present

## 2015-05-09 LAB — CBC WITH DIFFERENTIAL/PLATELET
BASO%: 0.2 % (ref 0.0–2.0)
Basophils Absolute: 0 10*3/uL (ref 0.0–0.1)
EOS%: 3.2 % (ref 0.0–7.0)
Eosinophils Absolute: 0.2 10*3/uL (ref 0.0–0.5)
HCT: 43.1 % (ref 38.4–49.9)
HGB: 14.2 g/dL (ref 13.0–17.1)
LYMPH%: 35.5 % (ref 14.0–49.0)
MCH: 29.2 pg (ref 27.2–33.4)
MCHC: 32.9 g/dL (ref 32.0–36.0)
MCV: 88.8 fL (ref 79.3–98.0)
MONO#: 0.6 10*3/uL (ref 0.1–0.9)
MONO%: 8.7 % (ref 0.0–14.0)
NEUT#: 3.7 10*3/uL (ref 1.5–6.5)
NEUT%: 52.4 % (ref 39.0–75.0)
Platelets: 183 10*3/uL (ref 140–400)
RBC: 4.85 10*6/uL (ref 4.20–5.82)
RDW: 13.3 % (ref 11.0–14.6)
WBC: 7 10*3/uL (ref 4.0–10.3)
lymph#: 2.5 10*3/uL (ref 0.9–3.3)

## 2015-05-09 LAB — COMPREHENSIVE METABOLIC PANEL
ALT: 19 U/L (ref 0–55)
AST: 27 U/L (ref 5–34)
Albumin: 4 g/dL (ref 3.5–5.0)
Alkaline Phosphatase: 48 U/L (ref 40–150)
Anion Gap: 7 mEq/L (ref 3–11)
BUN: 16.9 mg/dL (ref 7.0–26.0)
CO2: 29 mEq/L (ref 22–29)
Calcium: 10.1 mg/dL (ref 8.4–10.4)
Chloride: 105 mEq/L (ref 98–109)
Creatinine: 1 mg/dL (ref 0.7–1.3)
EGFR: 81 mL/min/{1.73_m2} — ABNORMAL LOW (ref >90)
Glucose: 97 mg/dl (ref 70–140)
Potassium: 4.2 mEq/L (ref 3.5–5.1)
Sodium: 141 mEq/L (ref 136–145)
Total Bilirubin: 0.59 mg/dL (ref 0.20–1.20)
Total Protein: 7.3 g/dL (ref 6.4–8.3)

## 2015-05-09 MED ORDER — IOHEXOL 300 MG/ML  SOLN
100.0000 mL | Freq: Once | INTRAMUSCULAR | Status: AC | PRN
  Administered 2015-05-09: 100 mL via INTRAVENOUS

## 2015-05-09 NOTE — Progress Notes (Signed)
Called PCP office Dr. Moreen Fowler.  Left detailed message request with medical records for lipids to be faxed.

## 2015-05-10 LAB — PSA: Prostate Specific Ag, Serum: 0.3 ng/mL (ref 0.0–4.0)

## 2015-05-10 LAB — PSA (PARALLEL TESTING): PSA: 0.29 ng/mL (ref <=4.00)

## 2015-05-11 ENCOUNTER — Ambulatory Visit (HOSPITAL_BASED_OUTPATIENT_CLINIC_OR_DEPARTMENT_OTHER): Payer: Medicare Other | Admitting: Oncology

## 2015-05-11 ENCOUNTER — Telehealth: Payer: Self-pay | Admitting: Oncology

## 2015-05-11 VITALS — BP 153/78 | HR 74 | Temp 98.6°F | Resp 18 | Ht 71.0 in | Wt 187.8 lb

## 2015-05-11 DIAGNOSIS — C61 Malignant neoplasm of prostate: Secondary | ICD-10-CM

## 2015-05-11 NOTE — Telephone Encounter (Signed)
Gave and printed appt sched for May 2017

## 2015-05-11 NOTE — Progress Notes (Signed)
Hematology and Oncology Follow Up Visit  CUB GIANNINI [id number] 1945-05-06 70 y.o. 05/11/2015 8:36 AM    Principle Diagnosis: 70 year old gentleman diagnosed with prostate cancer in November of 2012 presented with a PSA of 4.38 and found to have a Gleason score 4+3 equals 7, his clinical staging was T2c N1. 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    Prior Therapy: He presented with a PSA of 4.38 and subsequently underwent a radical prostatectomy and lymph node dissection on November of 2012. 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  He did not have any evidence of angiolymphatic invasion or extraprostatic extension. The margins were negative. He had one out of 12 lymph nodes involved with cancer predominantly in the right pelvic wall.  His post operative PSA remained at 0.65 and subsequently to 0.89. And in March of 2013 he was started on Lupron and have been on it since that time. His PSA remains very low 0.01   Current therapy: Lupron given every 6 months under the care of Dr. Alinda Money.  Interim History:  Mr. Siam presents today for a follow-up visit. Since the last visit, he reports no recent complaints. He did have a upper respiratory infection that have resolved. He remains very active without any decline in his energy her performance status. He did not report any hospitalization or illnesses. He did not report any pelvic pain or hematuria. He does report very mild incontinence. He does not report any bone pain or constitutional symptoms.  He does not report any headaches, blurry vision, syncope or seizures. He has not reported any weight loss or appetite changes. Did not report any constitutional symptoms. Does not report any abdominal pain or satiety. Did not report any back pain shoulder pain or any bone discomfort. Has not reported any hematochezia or melena. He does  not report any headaches or blurry vision or syncope. He does not report any lymphadenopathy or petechiae. The remaining review of systems is unremarkable.   Medications: I have reviewed the patient's current medications.  Current Outpatient Prescriptions  Medication Sig Dispense Refill  . ascorbic acid (VITAMIN C) 1000 MG tablet Take 1,000 mg by mouth 3 (three) times a week.    Marland Kitchen aspirin EC 81 MG tablet Take 81 mg by mouth daily.    . benazepril-hydrochlorthiazide (LOTENSIN HCT) 20-12.5 MG per tablet Take 1 tablet by mouth every morning.      . Calcium Carbonate-Vitamin D (CALCIUM 600+D3 PO) Take 1 tablet by mouth daily. Patient takes 1-2 tablets daily    . diphenhydrAMINE (BENADRYL) 25 MG tablet Take 25 mg by mouth every 8 (eight) hours as needed. For allergies    . Ibuprofen-Diphenhydramine Cit (ADVIL PM PO) Take 1 tablet by mouth at bedtime as needed.    . MULTIPLE VITAMIN PO Take 1 tablet by mouth daily.    . simvastatin (ZOCOR) 20 MG tablet Take 20 mg by mouth at bedtime.      No current facility-administered medications for this visit.     Allergies: No Known Allergies  Past Medical History, Surgical history, Social history, and Family History were reviewed and updated.  Physical Exam: Blood pressure 153/78, pulse 74, temperature 98.6 F (37 C), temperature source Oral, resp. rate 18, height 5\' 11"  (1.803 m), weight 187 lb 12.8 oz (85.186 kg), SpO2 98 %. ECOG: 0 General appearance: alert and cooperative appeared without distress. Head: Normocephalic, without obvious abnormality no oral ulcers or lesions. Neck:  no adenopathy Lymph nodes: Cervical, supraclavicular, and axillary nodes normal. Heart:regular rate and rhythm, S1, S2 normal, no murmur, click, rub or gallop Lung:chest clear, no wheezing, rales, normal symmetric air entry Abdomin: soft, non-tender, without masses or organomegaly no shifting dullness or ascites. EXT:no erythema, induration, or nodules   Lab  Results: Lab Results  Component Value Date   WBC 7.0 05/09/2015   HGB 14.2 05/09/2015   HCT 43.1 05/09/2015   MCV 88.8 05/09/2015   PLT 183 05/09/2015     Chemistry      Component Value Date/Time   NA 141 05/09/2015 0830   NA 133* 01/14/2011 0517   K 4.2 05/09/2015 0830   K 4.1 01/14/2011 0517   CL 101 01/14/2011 0517   CO2 29 05/09/2015 0830   CO2 26 01/14/2011 0517   BUN 16.9 05/09/2015 0830   BUN 15 01/14/2011 0517   CREATININE 1.0 05/09/2015 0830   CREATININE 0.86 01/14/2011 0517      Component Value Date/Time   CALCIUM 10.1 05/09/2015 0830   CALCIUM 8.6 01/14/2011 0517   ALKPHOS 48 05/09/2015 0830   AST 27 05/09/2015 0830   ALT 19 05/09/2015 0830   BILITOT 0.59 05/09/2015 0830     Results for Heinsohn, Esten L (MRN [id number]) as of 05/11/2015 08:38  Ref. Range 05/09/2014 08:41 05/09/2015 08:30  PSA Latest Ref Range: <=4.00 ng/mL 0.08 0.29     Radiological Studies: Ct Chest W Contrast  05/09/2015  CLINICAL DATA:  Staging prostate cancer. Initially diagnosed in 2012 with robotic assisted laparoscopic radical prostatectomy 01/13/2011. No current complaints. EXAM: CT CHEST, ABDOMEN, AND PELVIS WITH CONTRAST TECHNIQUE: Multidetector CT imaging of the chest, abdomen and pelvis was performed following the standard protocol during bolus administration of intravenous contrast. CONTRAST:  163mL OMNIPAQUE IOHEXOL 300 MG/ML  SOLN COMPARISON:  CTs 05/09/2014 and 09/05/2013. FINDINGS: CT CHEST Mediastinum/Nodes: There are no enlarged mediastinal, hilar or axillary lymph nodes. The thyroid gland, trachea and esophagus demonstrate no significant findings. The heart size is normal. There is no pericardial effusion. Stable mild atherosclerosis. Lungs/Pleura: There is no pleural effusion. Scattered small pulmonary nodules are again noted, unchanged in size and distribution. No worrisome pulmonary nodules or acute pulmonary findings. Musculoskeletal/Chest wall: No chest wall mass or suspicious  osseous findings. Stable small bone island in the right humeral head. CT ABDOMEN AND PELVIS FINDINGS Hepatobiliary: Stable tiny low-density hepatic lesions on images 46 and 57. No new, enlarging or worrisome hepatic findings. No evidence of gallstones, gallbladder wall thickening or biliary dilatation. Pancreas: Unremarkable. No pancreatic ductal dilatation or surrounding inflammatory changes. Spleen: Normal in size without focal abnormality. Adrenals/Urinary Tract: Both adrenal glands appear normal. The kidneys appear stable with unchanged low-density cysts bilaterally. No evidence of enhancing renal mass, urinary tract calculus or hydronephrosis. The bladder is suboptimally distended without apparent abnormality. Stomach/Bowel: No evidence of bowel wall thickening, distention or surrounding inflammatory change. Appendix not visualized. Vascular/Lymphatic: There are no enlarged abdominal or pelvic lymph nodes. There is stable atherosclerosis of the aorta, its branches and the iliac arteries. Reproductive: Prostatectomy.  No evidence of local recurrence. Other: The anterior abdominal wall appears normal. No ascites or peritoneal nodularity. Musculoskeletal: No acute or significant osseous findings. There are stable small scattered sclerotic lesions. There are degenerative changes of both hips. IMPRESSION: 1. Stable CTs of the chest, abdomen and pelvis. No evidence of local recurrence or metastatic disease. 2. No acute findings. 3. Stable small pulmonary nodules bilaterally, benign based on stability. 4. Stable atherosclerosis. Electronically  Signed   By: Richardean Sale M.D.   On: 05/09/2015 11:44   Ct Abdomen Pelvis W Contrast  05/09/2015  CLINICAL DATA:  Staging prostate cancer. Initially diagnosed in 2012 with robotic assisted laparoscopic radical prostatectomy 01/13/2011. No current complaints. EXAM: CT CHEST, ABDOMEN, AND PELVIS WITH CONTRAST TECHNIQUE: Multidetector CT imaging of the chest, abdomen and  pelvis was performed following the standard protocol during bolus administration of intravenous contrast. CONTRAST:  148mL OMNIPAQUE IOHEXOL 300 MG/ML  SOLN COMPARISON:  CTs 05/09/2014 and 09/05/2013. FINDINGS: CT CHEST Mediastinum/Nodes: There are no enlarged mediastinal, hilar or axillary lymph nodes. The thyroid gland, trachea and esophagus demonstrate no significant findings. The heart size is normal. There is no pericardial effusion. Stable mild atherosclerosis. Lungs/Pleura: There is no pleural effusion. Scattered small pulmonary nodules are again noted, unchanged in size and distribution. No worrisome pulmonary nodules or acute pulmonary findings. Musculoskeletal/Chest wall: No chest wall mass or suspicious osseous findings. Stable small bone island in the right humeral head. CT ABDOMEN AND PELVIS FINDINGS Hepatobiliary: Stable tiny low-density hepatic lesions on images 46 and 57. No new, enlarging or worrisome hepatic findings. No evidence of gallstones, gallbladder wall thickening or biliary dilatation. Pancreas: Unremarkable. No pancreatic ductal dilatation or surrounding inflammatory changes. Spleen: Normal in size without focal abnormality. Adrenals/Urinary Tract: Both adrenal glands appear normal. The kidneys appear stable with unchanged low-density cysts bilaterally. No evidence of enhancing renal mass, urinary tract calculus or hydronephrosis. The bladder is suboptimally distended without apparent abnormality. Stomach/Bowel: No evidence of bowel wall thickening, distention or surrounding inflammatory change. Appendix not visualized. Vascular/Lymphatic: There are no enlarged abdominal or pelvic lymph nodes. There is stable atherosclerosis of the aorta, its branches and the iliac arteries. Reproductive: Prostatectomy.  No evidence of local recurrence. Other: The anterior abdominal wall appears normal. No ascites or peritoneal nodularity. Musculoskeletal: No acute or significant osseous findings. There  are stable small scattered sclerotic lesions. There are degenerative changes of both hips. IMPRESSION: 1. Stable CTs of the chest, abdomen and pelvis. No evidence of local recurrence or metastatic disease. 2. No acute findings. 3. Stable small pulmonary nodules bilaterally, benign based on stability. 4. Stable atherosclerosis. Electronically Signed   By: Richardean Sale M.D.   On: 05/09/2015 11:44     Impression and Plan:  70 year old gentleman with the following issues:  1. Prostate cancer diagnosed in November of 2012 presented with a PSA of 4.38 and found to have a Gleason score 4+3 equals 7, his clinical staging was T2c N1. He is status post robotic prostatectomy and lymphadenectomy with one of 12 lymph nodes were involved. He continued to have elevated PSA that is detectable around 0.68 and 0.84 and had been on Lupron since March of 2013.  His most recent PSA on 05/09/2015 was up to 0.29. His CT scan chest abdomen and pelvis on 05/09/2015 was reviewed and discussed with the patient and showed no evidence of measurable disease.  Options of therapy was discussed today with Mr. Kendrick regarding his slow rise in PSA. Continued observation and surveillance would be one option versus salvage radiation therapy and possibly Provenge immunotherapy.  Salvage radiation therapy might not be ideal given his persistent elevated PSA postoperatively however, his PSA have been under reasonable control with a slow rise on a androgen deprivation and might warrant reconsideration at this time.  I will defer Provenge immunotherapy or second line hormonal therapy if his PSA continues to rise with a doubling time and the whole numbers of less than 6  months.  He would probably benefit from alternative imaging studies such as fluoride PET As well as consideration for clinical trial as well. We will present him at the GU cancer multidisciplinary conference for discussion in near future.  2. Androgen depravation: He is  to continue Lupron with Dr. Alinda Money has prescribed.  3. Bone health: I agree with calcium and vitamin D and repeat bone density frequently.  [facility], MD 3/17/20178:36 AM

## 2015-05-15 ENCOUNTER — Telehealth: Payer: Self-pay | Admitting: *Deleted

## 2015-05-15 DIAGNOSIS — E785 Hyperlipidemia, unspecified: Secondary | ICD-10-CM

## 2015-05-15 NOTE — Telephone Encounter (Signed)
-----   Message from Fay Records, MD sent at 05/09/2015  9:10 AM EDT ----- Pt needs lipid panel done  ----- Message -----    From: Fay Records, MD    Sent: 04/27/2015   6:48 PM      To: Fay Records, MD

## 2015-05-15 NOTE — Telephone Encounter (Signed)
Left detailed message on home answering machine that we have not received the lipids from Aug 2016 from Dr. Laqueta Linden office and that Dr. Harrington Challenger would like him to come to our office to have them drawn. I asked him to call the office and schedule an appointment for lipid panel to be drawn.

## 2015-05-25 ENCOUNTER — Ambulatory Visit: Payer: Medicare Other | Admitting: Internal Medicine

## 2015-05-25 ENCOUNTER — Other Ambulatory Visit (INDEPENDENT_AMBULATORY_CARE_PROVIDER_SITE_OTHER): Payer: Medicare Other | Admitting: *Deleted

## 2015-05-25 ENCOUNTER — Telehealth: Payer: Self-pay | Admitting: Internal Medicine

## 2015-05-25 DIAGNOSIS — E785 Hyperlipidemia, unspecified: Secondary | ICD-10-CM | POA: Diagnosis not present

## 2015-05-25 LAB — LIPID PANEL
Cholesterol: 110 mg/dL — ABNORMAL LOW (ref 125–200)
HDL: 38 mg/dL — ABNORMAL LOW (ref >=40)
LDL Cholesterol: 57 mg/dL (ref <130)
Total CHOL/HDL Ratio: 2.9 Ratio (ref <=5.0)
Triglycerides: 75 mg/dL (ref <150)
VLDL: 15 mg/dL (ref <30)

## 2015-05-25 NOTE — Telephone Encounter (Signed)
Patient reports persistent cough that started about 1 1/2 weeks ago. Reviewed with Dr. Harrington Challenger. Informed patient this recent cough is not related to current medications. He also asks if this could be fluid around his heart and I reassured him this was not fluid around his heart. Patient will f/u w/ PCP.

## 2015-05-25 NOTE — Telephone Encounter (Signed)
Walk In pt form-pt has concerns about cough-gave to [employer] today

## 2015-06-01 ENCOUNTER — Encounter: Payer: Self-pay | Admitting: Radiation Oncology

## 2015-06-01 NOTE — Progress Notes (Signed)
GU Location of Tumor / Histology: diagnosed with prostate cancer in November 2012. His post operative PSA remained at 0.65 and subsequently to 0.89. In March 2013 he was started on Lupron and has been on it every six months since.   If Prostate Cancer, Gleason Score is (4 + 3) and PSA is (0.3) on 05/09/15. PSA has been better controlled with Lupron.     Past/Anticipated interventions by urology, if any: prostatectomy, Lupron every four months  Past/Anticipated interventions by medical oncology, if any: surveillance  Weight changes, if any: no  Bowel/Bladder complaints, if any: IPSS 12. Reports frequency, urgency and nocturia x 2. Reports incontinence has been present since prostatectomy. Describes his incontinence as mild and explains he wears one liner per day. Reports incontinence is worse late after, at night and while exercising. Reports incontinence has not become any worse or better over the last three years. Denies dysuria or hematuria. Reports mild hot flashes. Denies having any sexual activity in the last four and a half years.  Nausea/Vomiting, if any: no  Pain issues, if any:  no  SAFETY ISSUES:  Prior radiation? no  Pacemaker/ICD? no  Possible current pregnancy? no  Is the patient on methotrexate? no  Current Complaints / other details:  70 year old male.

## 2015-06-04 ENCOUNTER — Ambulatory Visit
Admission: RE | Admit: 2015-06-04 | Discharge: 2015-06-04 | Disposition: A | Discharge status: Home / Self Care | Payer: Medicare Other | Source: Ambulatory Visit | Attending: Radiation Oncology | Admitting: Radiation Oncology

## 2015-06-04 ENCOUNTER — Encounter: Payer: Self-pay | Admitting: Radiation Oncology

## 2015-06-04 VITALS — Resp 16 | Ht 71.0 in | Wt 189.3 lb

## 2015-06-04 DIAGNOSIS — M1990 Unspecified osteoarthritis, unspecified site: Secondary | ICD-10-CM | POA: Diagnosis not present

## 2015-06-04 DIAGNOSIS — Z510 Encounter for antineoplastic radiation therapy: Secondary | ICD-10-CM | POA: Diagnosis not present

## 2015-06-04 DIAGNOSIS — J069 Acute upper respiratory infection, unspecified: Secondary | ICD-10-CM | POA: Insufficient documentation

## 2015-06-04 DIAGNOSIS — C61 Malignant neoplasm of prostate: Secondary | ICD-10-CM | POA: Diagnosis not present

## 2015-06-04 DIAGNOSIS — I10 Essential (primary) hypertension: Secondary | ICD-10-CM | POA: Insufficient documentation

## 2015-06-04 HISTORY — DX: Malignant neoplasm of prostate: C61

## 2015-06-04 NOTE — Progress Notes (Signed)
See progress note under physician encounter. 

## 2015-06-04 NOTE — Progress Notes (Signed)
Radiation Oncology         ([phone]) [phone] ________________________________  Initial outpatient Consultation  Name: [name] MRN: [id number]  Date: 06/04/2015  DOB: 1946/01/06  [id number] W, MD  Wyatt Portela, MD   REFERRING PHYSICIAN: Wyatt Portela, MD  DIAGNOSIS: 70 y.o. gentleman with a detectable rising PSA of 0.3 status post prostatectomy with 1 node positive. He started ADT and now has a rising PSA.    ICD-9-CM ICD-10-CM   1. Malignant neoplasm of prostate ([place]) 185 C61   2. Prostate cancer ([place]) [place] ILLNESS::[name] is a 70 y.o. gentleman.  He was originally diagnosed with prostate cancer in November 2012. On November 19th, 2012, the patient underwent radical prostatectomy. Final pathology revealed Gleason score 4+3 without evidence of involvement of the capsule, seminal vesicles, or angiolymphatic invasion. Interestingly, there were neuroendocrine features within his original pathology, and also within the right pelvis, 1 of the 6 lymph nodes contained metastatic disease. His post operative PSA remained at 0.65 and subsequently to 0.89. In March 2013, he was started on Lupron and has been on this since. He has been followed annually with CT scans of the chest abdomen and pelvis all of which have not shown detectable radiographic abnormalities. Although his PSA initially responded very well to the Lupron, he was found to have a PSA of 0.08 in March 2016, and 0.29 in March 2017. He continues to be followed very closely with Dr. Alen Blew due to the neuroendocrine component of his cancer, he continues to get his Lupron at [facility], and he comes today to discuss the role for possible salvage radiotherapy to the prostatic fossa under the care of Dr. Tammi Klippel. Of note Dr. Alen Blew has also considered adding immunotherapy with Provenge if his PSA doubles within 6 months time.       PREVIOUS RADIATION THERAPY: No  PAST MEDICAL HISTORY:  Past Medical  History  Diagnosis Date  . Hypertension   . Recurrent upper respiratory infection (URI)     pt had a cold recent- week ago - better now   . Arthritis     hands   . Cancer [facility])     prostate cancer   . Prostate cancer [facility])      PAST SURGICAL HISTORY: Past Surgical History  Procedure Laterality Date  . Other surgical history  2005    right hand middle finger surgery   . Robot assisted laparoscopic radical prostatectomy  01/13/2011    Procedure: ROBOTIC ASSISTED LAPAROSCOPIC RADICAL PROSTATECTOMY LEVEL 2;  Surgeon: Dutch Gray, MD;  Location: [facility];  Service: Urology;  Laterality: Bilateral;  Robotic Assisted Laparoscopic Prostatetectomy with Bilateral Lymphadenectomy  Level 2    FAMILY HISTORY:  Family History  Problem Relation Age of Onset  . Heart attack Father   . Hypertension Father   . Cancer Mother     cervical with mets to lung  . Hypertension Sister     SOCIAL HISTORY:  reports that he has never smoked. He has never used smokeless tobacco. He reports that he drinks about 2.4 oz of alcohol per week. He reports that he does not use illicit drugs. The patient is married and resides in [place]. 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    ALLERGIES: Review of patient's allergies indicates no known allergies.  MEDICATIONS:  Current Outpatient Prescriptions  Medication Sig Dispense Refill  . ascorbic acid (VITAMIN C) 1000 MG  tablet Take 1,000 mg by mouth 3 (three) times a week.    Marland Kitchen aspirin EC 81 MG tablet Take 81 mg by mouth daily.    . benazepril-hydrochlorthiazide (LOTENSIN HCT) 20-12.5 MG per tablet Take 1 tablet by mouth every morning.      . Calcium Carbonate-Vitamin D (CALCIUM 600+D3 PO) Take 1 tablet by mouth daily. Patient takes 1-2 tablets daily    . diphenhydrAMINE (BENADRYL) 25 MG tablet Take 25 mg by mouth every 8 (eight) hours as needed. For allergies    . Ibuprofen-Diphenhydramine Cit (ADVIL PM PO) Take 1 tablet by mouth  at bedtime as needed.    Marland Kitchen MELATONIN ER PO Take by mouth.    . MULTIPLE VITAMIN PO Take 1 tablet by mouth daily.    . simvastatin (ZOCOR) 20 MG tablet Take 20 mg by mouth at bedtime.      No current facility-administered medications for this encounter.    REVIEW OF SYSTEMS:  On review of systems, the patient reports that he is doing well overall. He denies any chest pain, shortness of breath, cough, fevers, chills, night sweats, unintended weight changes. He denies any bowel disturbances, and denies abdominal pain, nausea or vomiting. He reports frequency, urgency and nocturia x 2. Reports incontinence has been present since prostatectomy. Describes his incontinence as mild and explains he wears one liner per day. Reports incontinence is worse late after, at night and while exercising He denies any new musculoskeletal or joint aches or pains. He patient completed an IPSS and IIEF questionnaire.  His IPSS score was 12 indicating moderate urinary outflow obstructive symptoms. Denies having any sexual activity in the last four and a half years. A complete review of systems is obtained and is otherwise negative.    PHYSICAL EXAM:   height is 5\' 11"  (1.803 m) and weight is 189 lb 4.8 oz (85.866 kg). His respiration is 16 and oxygen saturation is 100%.   In general this is a well appearing Caucasian male in no acute distress. He is alert and oriented x4 and appropriate throughout the examination. HEENT reveals that the patient is normocephalic, atraumatic. EOMs are intact. PERRLA. Skin is intact without any evidence of gross lesions. Cardiovascular exam reveals a regular rate and rhythm, no clicks rubs or murmurs are auscultated. Chest is clear to auscultation bilaterally. Lymphatic assessment is performed and does not reveal any adenopathy in the cervical, supraclavicular, axillary, or inguinal chains. Abdomen has active bowel sounds in all quadrants and is intact. The abdomen is soft, non tender, non  distended. Lower extremities are negative for pretibial pitting edema, deep calf tenderness, cyanosis or clubbing.   KPS= 90  100 - Normal; no complaints; no evidence of disease. 90   - Able to carry on normal activity; minor signs or symptoms of disease. 80   - Normal activity with effort; some signs or symptoms of disease. 31   - Cares for self; unable to carry on normal activity or to do active work. 60   - Requires occasional assistance, but is able to care for most of his personal needs. 50   - Requires considerable assistance and frequent medical care. 61   - Disabled; requires special care and assistance. 64   - Severely disabled; hospital admission is indicated although death not imminent. 12   - Very sick; hospital admission necessary; active supportive treatment necessary. 10   - Moribund; fatal processes progressing rapidly. 0     - Dead  Karnofsky DA, [place],  Craver LS and Burchenal JH (1948) The use of the nitrogen mustards in the palliative treatment of carcinoma: with particular reference to bronchogenic carcinoma Cancer 1 634-56   LABORATORY DATA:  Lab Results  Component Value Date   WBC 7.0 05/09/2015   HGB 14.2 05/09/2015   HCT 43.1 05/09/2015   MCV 88.8 05/09/2015   PLT 183 05/09/2015   Lab Results  Component Value Date   NA 141 05/09/2015   K 4.2 05/09/2015   CL 101 01/14/2011   CO2 29 05/09/2015   Lab Results  Component Value Date   ALT 19 05/09/2015   AST 27 05/09/2015   ALKPHOS 48 05/09/2015   BILITOT 0.59 05/09/2015     RADIOGRAPHY: Ct Chest W Contrast  05/09/2015  CLINICAL DATA:  Staging prostate cancer. Initially diagnosed in 2012 with robotic assisted laparoscopic radical prostatectomy 01/13/2011. No current complaints. EXAM: CT CHEST, ABDOMEN, AND PELVIS WITH CONTRAST TECHNIQUE: Multidetector CT imaging of the chest, abdomen and pelvis was performed following the standard protocol during bolus administration of intravenous contrast.  CONTRAST:  140mL OMNIPAQUE IOHEXOL 300 MG/ML  SOLN COMPARISON:  CTs 05/09/2014 and 09/05/2013. FINDINGS: CT CHEST Mediastinum/Nodes: There are no enlarged mediastinal, hilar or axillary lymph nodes. The thyroid gland, trachea and esophagus demonstrate no significant findings. The heart size is normal. There is no pericardial effusion. Stable mild atherosclerosis. Lungs/Pleura: There is no pleural effusion. Scattered small pulmonary nodules are again noted, unchanged in size and distribution. No worrisome pulmonary nodules or acute pulmonary findings. Musculoskeletal/Chest wall: No chest wall mass or suspicious osseous findings. Stable small bone island in the right humeral head. CT ABDOMEN AND PELVIS FINDINGS Hepatobiliary: Stable tiny low-density hepatic lesions on images 46 and 57. No new, enlarging or worrisome hepatic findings. No evidence of gallstones, gallbladder wall thickening or biliary dilatation. Pancreas: Unremarkable. No pancreatic ductal dilatation or surrounding inflammatory changes. Spleen: Normal in size without focal abnormality. Adrenals/Urinary Tract: Both adrenal glands appear normal. The kidneys appear stable with unchanged low-density cysts bilaterally. No evidence of enhancing renal mass, urinary tract calculus or hydronephrosis. The bladder is suboptimally distended without apparent abnormality. Stomach/Bowel: No evidence of bowel wall thickening, distention or surrounding inflammatory change. Appendix not visualized. Vascular/Lymphatic: There are no enlarged abdominal or pelvic lymph nodes. There is stable atherosclerosis of the aorta, its branches and the iliac arteries. Reproductive: Prostatectomy.  No evidence of local recurrence. Other: The anterior abdominal wall appears normal. No ascites or peritoneal nodularity. Musculoskeletal: No acute or significant osseous findings. There are stable small scattered sclerotic lesions. There are degenerative changes of both hips. IMPRESSION: 1.  Stable CTs of the chest, abdomen and pelvis. No evidence of local recurrence or metastatic disease. 2. No acute findings. 3. Stable small pulmonary nodules bilaterally, benign based on stability. 4. Stable atherosclerosis. Electronically Signed   By: Richardean Sale M.D.   On: 05/09/2015 11:44   Ct Abdomen Pelvis W Contrast  05/09/2015  CLINICAL DATA:  Staging prostate cancer. Initially diagnosed in 2012 with robotic assisted laparoscopic radical prostatectomy 01/13/2011. No current complaints. EXAM: CT CHEST, ABDOMEN, AND PELVIS WITH CONTRAST TECHNIQUE: Multidetector CT imaging of the chest, abdomen and pelvis was performed following the standard protocol during bolus administration of intravenous contrast. CONTRAST:  156mL OMNIPAQUE IOHEXOL 300 MG/ML  SOLN COMPARISON:  CTs 05/09/2014 and 09/05/2013. FINDINGS: CT CHEST Mediastinum/Nodes: There are no enlarged mediastinal, hilar or axillary lymph nodes. The thyroid gland, trachea and esophagus demonstrate no significant findings. The heart size is normal. There is no  pericardial effusion. Stable mild atherosclerosis. Lungs/Pleura: There is no pleural effusion. Scattered small pulmonary nodules are again noted, unchanged in size and distribution. No worrisome pulmonary nodules or acute pulmonary findings. Musculoskeletal/Chest wall: No chest wall mass or suspicious osseous findings. Stable small bone island in the right humeral head. CT ABDOMEN AND PELVIS FINDINGS Hepatobiliary: Stable tiny low-density hepatic lesions on images 46 and 57. No new, enlarging or worrisome hepatic findings. No evidence of gallstones, gallbladder wall thickening or biliary dilatation. Pancreas: Unremarkable. No pancreatic ductal dilatation or surrounding inflammatory changes. Spleen: Normal in size without focal abnormality. Adrenals/Urinary Tract: Both adrenal glands appear normal. The kidneys appear stable with unchanged low-density cysts bilaterally. No evidence of enhancing renal  mass, urinary tract calculus or hydronephrosis. The bladder is suboptimally distended without apparent abnormality. Stomach/Bowel: No evidence of bowel wall thickening, distention or surrounding inflammatory change. Appendix not visualized. Vascular/Lymphatic: There are no enlarged abdominal or pelvic lymph nodes. There is stable atherosclerosis of the aorta, its branches and the iliac arteries. Reproductive: Prostatectomy.  No evidence of local recurrence. Other: The anterior abdominal wall appears normal. No ascites or peritoneal nodularity. Musculoskeletal: No acute or significant osseous findings. There are stable small scattered sclerotic lesions. There are degenerative changes of both hips. IMPRESSION: 1. Stable CTs of the chest, abdomen and pelvis. No evidence of local recurrence or metastatic disease. 2. No acute findings. 3. Stable small pulmonary nodules bilaterally, benign based on stability. 4. Stable atherosclerosis. Electronically Signed   By: Richardean Sale M.D.   On: 05/09/2015 11:44      IMPRESSION: This gentleman is a 70 year-old with a detectable rising PSA of 0.3 status post prostatectomy with 1 node positive. He's been followed on ADT with low PSA though now has a rising PSA.  Accordingly he is eligible for radiation treatment to the prostatic fossa and pelvic lymph nodes.   PLAN: Today Dr. Tammi Klippel reviewed the findings, disease trajectory, and workup thus far.  We discussed the natural history of prostate cancer.  We reviewed the the implications of T-stage, Gleason's Score, and PSA on decision-making and outcomes in prostate cancer.  We discussed radiation treatment in the management of prostate cancer with regard to the logistics and delivery of external beam radiation treatment. .   The patient is undecided whether he would like to proceed with prostate IMRT after we discussed the potential benefit of cure being about 20% with radiation.  We will share our discussion with Dr. Alen Blew.  The patient will decide how he would like to continue with treatment at his next visit with Dr. Alen Blew on Friday, 06/29/15. We will follow up with him by phone after that visit.  We enjoyed meeting with him today, and will look forward to participating in the care of this very nice gentleman.  The above documentation reflects my direct findings during this shared patient visit. Please see the separate note by Dr. Tammi Klippel on this date for the remainder of the patient's plan of care.    Carola Rhine, PAC       This document serves as a record of services personally performed by Shona Simpson, PAC and [name], MD. It was created on his behalf by Arlyce Harman, a trained medical scribe. The creation of this record is based on the scribe's personal observations and the provider's statements to them. This document has been checked and approved by the attending provider.

## 2015-06-05 ENCOUNTER — Telehealth: Payer: Self-pay | Admitting: Oncology

## 2015-06-05 ENCOUNTER — Encounter: Payer: Self-pay | Admitting: *Deleted

## 2015-06-05 NOTE — Telephone Encounter (Signed)
returned call and r/s appt....pt ok adn aware of new d.t

## 2015-06-05 NOTE — Progress Notes (Signed)
Per dr Alen Blew, pof sent for patient to be seen 06/13/15 @ 3:30. Patient notified of date and time.

## 2015-06-12 DIAGNOSIS — L853 Xerosis cutis: Secondary | ICD-10-CM | POA: Diagnosis not present

## 2015-06-12 DIAGNOSIS — D1801 Hemangioma of skin and subcutaneous tissue: Secondary | ICD-10-CM | POA: Diagnosis not present

## 2015-06-12 DIAGNOSIS — L821 Other seborrheic keratosis: Secondary | ICD-10-CM | POA: Diagnosis not present

## 2015-06-12 DIAGNOSIS — L812 Freckles: Secondary | ICD-10-CM | POA: Diagnosis not present

## 2015-06-12 DIAGNOSIS — L570 Actinic keratosis: Secondary | ICD-10-CM | POA: Diagnosis not present

## 2015-06-12 DIAGNOSIS — Z85828 Personal history of other malignant neoplasm of skin: Secondary | ICD-10-CM | POA: Diagnosis not present

## 2015-06-13 ENCOUNTER — Telehealth: Payer: Self-pay | Admitting: Oncology

## 2015-06-13 ENCOUNTER — Ambulatory Visit (HOSPITAL_BASED_OUTPATIENT_CLINIC_OR_DEPARTMENT_OTHER): Payer: Medicare Other | Admitting: Oncology

## 2015-06-13 VITALS — BP 131/62 | HR 63 | Temp 98.2°F | Resp 18 | Ht 71.0 in | Wt 187.2 lb

## 2015-06-13 DIAGNOSIS — C61 Malignant neoplasm of prostate: Secondary | ICD-10-CM | POA: Diagnosis not present

## 2015-06-13 NOTE — Progress Notes (Signed)
Hematology and Oncology Follow Up Visit  [name] [id number] 1945-12-29 70 y.o. 06/13/2015 4:16 PM    Principle Diagnosis: 70 year old gentleman diagnosed with prostate cancer in November of 2012 presented with a PSA of 4.38 and found to have a Gleason score 4+3 equals 7, his clinical staging was T2c N1. 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    Prior Therapy: He presented with a PSA of 4.38 and subsequently underwent a radical prostatectomy and lymph node dissection on November of 2012. 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  He did not have any evidence of angiolymphatic invasion or extraprostatic extension. The margins were negative. He had one out of 12 lymph nodes involved with cancer predominantly in the right pelvic wall.  His post operative PSA remained at 0.65 and subsequently to 0.89. And in March of 2013 he was started on Lupron and have been on it since that time. His PSA remains very low 0.01   Current therapy: Lupron given every 6 months under the care of Dr. Alinda Money.  Interim History:  [name] presents today for a follow-up visit. Since the last visit, he was evaluated by Dr. Tammi Klippel for possible salvage radiation therapy. He is contemplating this option for the time being. Overall, he reports no changes in his health. He remains very active without any decline in his energy her performance status.He did not report any pelvic pain or hematuria. He does report very mild incontinence.  He does report occasional incontinence but it is manageable at this time.  He does not report any headaches, blurry vision, syncope or seizures. He has not reported any weight loss or appetite changes. Did not report any constitutional symptoms. Does not report any abdominal pain or satiety. Did not report any back pain shoulder pain or any bone discomfort. Has not reported  any hematochezia or melena. He does not report any headaches or blurry vision or syncope. He does not report any lymphadenopathy or petechiae. The remaining review of systems is unremarkable.   Medications: I have reviewed the patient's current medications.  Current Outpatient Prescriptions  Medication Sig Dispense Refill  . ascorbic acid (VITAMIN C) 1000 MG tablet Take 1,000 mg by mouth 3 (three) times a week.    Marland Kitchen aspirin EC 81 MG tablet Take 81 mg by mouth daily.    . benazepril-hydrochlorthiazide (LOTENSIN HCT) 20-12.5 MG per tablet Take 1 tablet by mouth every morning.      . Calcium Carbonate-Vitamin D (CALCIUM 600+D3 PO) Take 1 tablet by mouth daily. Patient takes 1-2 tablets daily    . diphenhydrAMINE (BENADRYL) 25 MG tablet Take 25 mg by mouth every 8 (eight) hours as needed. For allergies    . Ibuprofen-Diphenhydramine Cit (ADVIL PM PO) Take 1 tablet by mouth at bedtime as needed.    Marland Kitchen MELATONIN ER PO Take by mouth.    . MULTIPLE VITAMIN PO Take 1 tablet by mouth daily.    . simvastatin (ZOCOR) 20 MG tablet Take 20 mg by mouth at bedtime.      No current facility-administered medications for this visit.     Allergies: No Known Allergies  Past Medical History, Surgical history, Social history, and Family History were reviewed and updated.  Physical Exam: Blood pressure 131/62, pulse 63, temperature 98.2 F (36.8 C), temperature source Oral, resp. rate 18, height 5\' 11"  (1.803 m), weight 187 lb 3.2 oz (84.913 kg), SpO2 99 %. ECOG: 0 General appearance:  Alert, awake gentleman without distress. Head: Normocephalic, without obvious abnormality no oral thrush noted. Neck: no adenopathy Lymph nodes: Cervical, supraclavicular, and axillary nodes normal. Heart:regular rate and rhythm, S1, S2 normal, no murmur, click, rub or gallop Lung:chest clear, no wheezing, rales, normal symmetric air entry Abdomin: soft, non-tender, without masses or organomegaly no rebound or  guarding. EXT:no erythema, induration, or nodules   Lab Results: Lab Results  Component Value Date   WBC 7.0 05/09/2015   HGB 14.2 05/09/2015   HCT 43.1 05/09/2015   MCV 88.8 05/09/2015   PLT 183 05/09/2015     Chemistry      Component Value Date/Time   NA 141 05/09/2015 0830   NA 133* 01/14/2011 0517   K 4.2 05/09/2015 0830   K 4.1 01/14/2011 0517   CL 101 01/14/2011 0517   CO2 29 05/09/2015 0830   CO2 26 01/14/2011 0517   BUN 16.9 05/09/2015 0830   BUN 15 01/14/2011 0517   CREATININE 1.0 05/09/2015 0830   CREATININE 0.86 01/14/2011 0517      Component Value Date/Time   CALCIUM 10.1 05/09/2015 0830   CALCIUM 8.6 01/14/2011 0517   ALKPHOS 48 05/09/2015 0830   AST 27 05/09/2015 0830   ALT 19 05/09/2015 0830   BILITOT 0.59 05/09/2015 0830     Results for [name], [name] (MRN [id number]) as of 05/11/2015 08:38  Ref. Range 05/09/2014 08:41 05/09/2015 08:30  PSA Latest Ref Range: <=4.00 ng/mL 0.08 0.29       Impression and Plan:  70 year old gentleman with the following issues:  1. Prostate cancer diagnosed in November of 2012 presented with a PSA of 4.38 and found to have a Gleason score 4+3 equals 7, his clinical staging was T2c N1. He is status post robotic prostatectomy and lymphadenectomy with one of 12 lymph nodes were involved. He continued to have elevated PSA that is detectable around 0.68 and 0.84 and had been on Lupron since March of 2013.  PSA on 05/09/2015 was up to 0.29. His CT scan chest abdomen and pelvis on 05/09/2015 showed no evidence of measurable disease.  Options of therapy were reviewed again today including the role of salvage radiation therapy. I do agree with Dr. Tammi Klippel that is low possibility of cure but it is the only possibility of providing curative therapy. Systemic therapy is always an option and potentially can be used down the line. These options would include immunotherapy as well as second line hormonal therapy in addition to  chemotherapy. There is little value of introducing these agents early given the fact that he has biochemical relapse disease and completely asymptomatic.  After discussion today he is leaning to proceeding with radiation therapy and I will evaluate him afterwards discuss further systemic options if his PSA does not respond.  2. Androgen depravation: He is to continue Lupron with Dr. Alinda Money has prescribed.  3. Bone health: I agree with calcium and vitamin D and repeat bone density frequently.  Zola Button, MD 4/19/20174:16 PM

## 2015-06-13 NOTE — Telephone Encounter (Signed)
Gave pt appt for july & avs

## 2015-06-20 ENCOUNTER — Ambulatory Visit
Admission: RE | Admit: 2015-06-20 | Discharge: 2015-06-20 | Disposition: A | Discharge status: Home / Self Care | Payer: Medicare Other | Source: Ambulatory Visit | Attending: Radiation Oncology | Admitting: Radiation Oncology

## 2015-06-20 DIAGNOSIS — J069 Acute upper respiratory infection, unspecified: Secondary | ICD-10-CM | POA: Diagnosis not present

## 2015-06-20 DIAGNOSIS — I10 Essential (primary) hypertension: Secondary | ICD-10-CM | POA: Diagnosis not present

## 2015-06-20 DIAGNOSIS — C61 Malignant neoplasm of prostate: Secondary | ICD-10-CM | POA: Diagnosis not present

## 2015-06-20 DIAGNOSIS — M1990 Unspecified osteoarthritis, unspecified site: Secondary | ICD-10-CM | POA: Diagnosis not present

## 2015-06-20 DIAGNOSIS — Z510 Encounter for antineoplastic radiation therapy: Secondary | ICD-10-CM | POA: Diagnosis not present

## 2015-06-22 ENCOUNTER — Ambulatory Visit: Payer: Medicare Other | Admitting: Radiation Oncology

## 2015-06-22 DIAGNOSIS — G4700 Insomnia, unspecified: Secondary | ICD-10-CM | POA: Diagnosis not present

## 2015-06-22 DIAGNOSIS — C61 Malignant neoplasm of prostate: Secondary | ICD-10-CM | POA: Diagnosis not present

## 2015-06-22 DIAGNOSIS — I10 Essential (primary) hypertension: Secondary | ICD-10-CM | POA: Diagnosis not present

## 2015-06-22 DIAGNOSIS — E785 Hyperlipidemia, unspecified: Secondary | ICD-10-CM | POA: Diagnosis not present

## 2015-06-22 DIAGNOSIS — D696 Thrombocytopenia, unspecified: Secondary | ICD-10-CM | POA: Diagnosis not present

## 2015-06-25 ENCOUNTER — Telehealth: Payer: Self-pay | Admitting: Oncology

## 2015-06-25 NOTE — Telephone Encounter (Signed)
pt called to cx 5.3 appt ...he did not need appt.

## 2015-06-27 ENCOUNTER — Ambulatory Visit: Payer: Medicare Other | Admitting: Oncology

## 2015-06-29 ENCOUNTER — Ambulatory Visit: Payer: Medicare Other | Admitting: Oncology

## 2015-06-29 DIAGNOSIS — I10 Essential (primary) hypertension: Secondary | ICD-10-CM | POA: Diagnosis not present

## 2015-06-29 DIAGNOSIS — M1990 Unspecified osteoarthritis, unspecified site: Secondary | ICD-10-CM | POA: Diagnosis not present

## 2015-06-29 DIAGNOSIS — Z510 Encounter for antineoplastic radiation therapy: Secondary | ICD-10-CM | POA: Diagnosis not present

## 2015-06-29 DIAGNOSIS — C61 Malignant neoplasm of prostate: Secondary | ICD-10-CM | POA: Diagnosis not present

## 2015-06-29 DIAGNOSIS — J069 Acute upper respiratory infection, unspecified: Secondary | ICD-10-CM | POA: Diagnosis not present

## 2015-07-02 ENCOUNTER — Ambulatory Visit: Payer: Medicare Other | Admitting: Radiation Oncology

## 2015-07-02 ENCOUNTER — Ambulatory Visit
Admission: RE | Admit: 2015-07-02 | Discharge: 2015-07-02 | Disposition: A | Discharge status: Home / Self Care | Payer: Medicare Other | Source: Ambulatory Visit | Attending: Radiation Oncology | Admitting: Radiation Oncology

## 2015-07-02 DIAGNOSIS — M1990 Unspecified osteoarthritis, unspecified site: Secondary | ICD-10-CM | POA: Diagnosis not present

## 2015-07-02 DIAGNOSIS — Z510 Encounter for antineoplastic radiation therapy: Secondary | ICD-10-CM | POA: Diagnosis not present

## 2015-07-02 DIAGNOSIS — I10 Essential (primary) hypertension: Secondary | ICD-10-CM | POA: Diagnosis not present

## 2015-07-02 DIAGNOSIS — J069 Acute upper respiratory infection, unspecified: Secondary | ICD-10-CM | POA: Diagnosis not present

## 2015-07-02 DIAGNOSIS — C61 Malignant neoplasm of prostate: Secondary | ICD-10-CM | POA: Diagnosis not present

## 2015-07-03 ENCOUNTER — Ambulatory Visit: Payer: Medicare Other | Admitting: Radiation Oncology

## 2015-07-03 ENCOUNTER — Ambulatory Visit
Admission: RE | Admit: 2015-07-03 | Discharge: 2015-07-03 | Disposition: A | Discharge status: Home / Self Care | Payer: Medicare Other | Source: Ambulatory Visit | Attending: Radiation Oncology | Admitting: Radiation Oncology

## 2015-07-03 DIAGNOSIS — J069 Acute upper respiratory infection, unspecified: Secondary | ICD-10-CM | POA: Diagnosis not present

## 2015-07-03 DIAGNOSIS — I10 Essential (primary) hypertension: Secondary | ICD-10-CM | POA: Diagnosis not present

## 2015-07-03 DIAGNOSIS — C61 Malignant neoplasm of prostate: Secondary | ICD-10-CM | POA: Diagnosis not present

## 2015-07-03 DIAGNOSIS — Z510 Encounter for antineoplastic radiation therapy: Secondary | ICD-10-CM | POA: Diagnosis not present

## 2015-07-03 DIAGNOSIS — M1990 Unspecified osteoarthritis, unspecified site: Secondary | ICD-10-CM | POA: Diagnosis not present

## 2015-07-04 ENCOUNTER — Ambulatory Visit
Admission: RE | Admit: 2015-07-04 | Discharge: 2015-07-04 | Disposition: A | Discharge status: Home / Self Care | Payer: Medicare Other | Source: Ambulatory Visit | Attending: Radiation Oncology | Admitting: Radiation Oncology

## 2015-07-04 ENCOUNTER — Ambulatory Visit: Payer: Medicare Other | Admitting: Radiation Oncology

## 2015-07-04 ENCOUNTER — Encounter: Payer: Self-pay | Admitting: Medical Oncology

## 2015-07-04 DIAGNOSIS — M1990 Unspecified osteoarthritis, unspecified site: Secondary | ICD-10-CM | POA: Diagnosis not present

## 2015-07-04 DIAGNOSIS — I10 Essential (primary) hypertension: Secondary | ICD-10-CM | POA: Diagnosis not present

## 2015-07-04 DIAGNOSIS — Z510 Encounter for antineoplastic radiation therapy: Secondary | ICD-10-CM | POA: Diagnosis not present

## 2015-07-04 DIAGNOSIS — C61 Malignant neoplasm of prostate: Secondary | ICD-10-CM | POA: Diagnosis not present

## 2015-07-04 DIAGNOSIS — J069 Acute upper respiratory infection, unspecified: Secondary | ICD-10-CM | POA: Diagnosis not present

## 2015-07-04 NOTE — Progress Notes (Signed)
Oncology Nurse Navigator Documentation  Oncology Nurse Navigator Flowsheets 07/04/2015  Navigator Location [facility]  Navigator Encounter Type Treatment  Abnormal Finding Date 05/09/2015  Confirmed Diagnosis Date (No Data)  Surgery Date (No Data)  Treatment Initiated Date 06/20/2015  Patient Visit Type RadOnc  Treatment Phase Treatment- I met [name] in the prostate support group. He had a prostatectomy in 2012 and has had a slow rise in his PSA. He had his CT sim 06/20/15 beginning his radiation treatments 07/02/15. I will continue to follow him and asked him to call me with any questions or concerns.  Barriers/Navigation Needs No barriers at this time  Interventions None required  Support Groups/Services Prostate Support Group  Acuity Level 1  Acuity Level 1 Initial guidance, education and coordination as needed  Time Spent with Patient 15

## 2015-07-05 ENCOUNTER — Ambulatory Visit: Payer: Medicare Other

## 2015-07-05 ENCOUNTER — Ambulatory Visit: Payer: Medicare Other | Admitting: Radiation Oncology

## 2015-07-05 ENCOUNTER — Ambulatory Visit
Admission: RE | Admit: 2015-07-05 | Discharge: 2015-07-05 | Disposition: A | Discharge status: Home / Self Care | Payer: Medicare Other | Source: Ambulatory Visit | Attending: Radiation Oncology | Admitting: Radiation Oncology

## 2015-07-05 ENCOUNTER — Encounter: Payer: Self-pay | Admitting: Radiation Oncology

## 2015-07-05 VITALS — BP 146/60 | HR 56 | Resp 16 | Wt 190.5 lb

## 2015-07-05 DIAGNOSIS — M1990 Unspecified osteoarthritis, unspecified site: Secondary | ICD-10-CM | POA: Diagnosis not present

## 2015-07-05 DIAGNOSIS — J069 Acute upper respiratory infection, unspecified: Secondary | ICD-10-CM | POA: Diagnosis not present

## 2015-07-05 DIAGNOSIS — C61 Malignant neoplasm of prostate: Secondary | ICD-10-CM | POA: Diagnosis not present

## 2015-07-05 DIAGNOSIS — Z510 Encounter for antineoplastic radiation therapy: Secondary | ICD-10-CM | POA: Diagnosis not present

## 2015-07-05 DIAGNOSIS — I10 Essential (primary) hypertension: Secondary | ICD-10-CM | POA: Diagnosis not present

## 2015-07-05 NOTE — Progress Notes (Signed)
  Radiation Oncology         [id number]   Name: [name] MRN: [id number]   Date: 07/05/2015  DOB: Mar 05, 1945    Weekly Radiation Therapy Management    ICD-9-CM ICD-10-CM   1. Prostate cancer ([place]) 185 C61     Current Dose: 7.2 Gy  Planned Dose:  75 Gy  Narrative The patient presents for routine under treatment assessment.  Weight and vitals stable. Denies pain. Denies dysuria or hematuria. Reports nocturia x2. Reports a strong steady urine stream. Denies difficulty emptying his bladder. Denies diarrhea. Denies fatigue.   Set-up films were reviewed. The chart was checked.  Physical Findings  weight is 190 lb 8 oz (86.41 kg). His blood pressure is 146/60 and his pulse is 56. His respiration is 16 and oxygen saturation is 100%.  Weight essentially stable. This is a pleasant male in no acute distress. He is alert and oriented.   Impression The patient is tolerating radiation.  Plan Continue treatment as planned.     Sheral Apley Tammi Klippel, M.D.  This document serves as a record of services personally performed by Shona Simpson, PA and [name], MD. It was created on their behalf by Jenell Milliner, a trained medical scribe. The creation of this record is based on the scribe's personal observations and the provider's statements to them. This document has been checked and approved by the attending provider.

## 2015-07-05 NOTE — Progress Notes (Signed)
Weight and vitals stable. Denies pain. Denies dysuria or hematuria. Reports nocturia x2. Reports a strong steady urine stream. Denies difficulty emptying his bladder. Denies diarrhea. Denies fatigue.   BP 146/60 mmHg  Pulse 56  Resp 16  Wt 190 lb 8 oz (86.41 kg)  SpO2 100% Wt Readings from Last 3 Encounters:  07/05/15 190 lb 8 oz (86.41 kg)  06/13/15 187 lb 3.2 oz (84.913 kg)  06/04/15 189 lb 4.8 oz (85.866 kg)

## 2015-07-06 ENCOUNTER — Ambulatory Visit: Payer: Medicare Other

## 2015-07-06 ENCOUNTER — Ambulatory Visit: Payer: Medicare Other | Admitting: Radiation Oncology

## 2015-07-06 ENCOUNTER — Ambulatory Visit
Admission: RE | Admit: 2015-07-06 | Discharge: 2015-07-06 | Disposition: A | Discharge status: Home / Self Care | Payer: Medicare Other | Source: Ambulatory Visit | Attending: Radiation Oncology | Admitting: Radiation Oncology

## 2015-07-06 DIAGNOSIS — Z510 Encounter for antineoplastic radiation therapy: Secondary | ICD-10-CM | POA: Diagnosis not present

## 2015-07-06 DIAGNOSIS — C61 Malignant neoplasm of prostate: Secondary | ICD-10-CM | POA: Diagnosis not present

## 2015-07-06 DIAGNOSIS — I10 Essential (primary) hypertension: Secondary | ICD-10-CM | POA: Diagnosis not present

## 2015-07-06 DIAGNOSIS — M1990 Unspecified osteoarthritis, unspecified site: Secondary | ICD-10-CM | POA: Diagnosis not present

## 2015-07-06 DIAGNOSIS — J069 Acute upper respiratory infection, unspecified: Secondary | ICD-10-CM | POA: Diagnosis not present

## 2015-07-09 ENCOUNTER — Ambulatory Visit: Payer: Medicare Other

## 2015-07-09 ENCOUNTER — Ambulatory Visit: Payer: Medicare Other | Admitting: Radiation Oncology

## 2015-07-09 ENCOUNTER — Ambulatory Visit
Admission: RE | Admit: 2015-07-09 | Discharge: 2015-07-09 | Disposition: A | Discharge status: Home / Self Care | Payer: Medicare Other | Source: Ambulatory Visit | Attending: Radiation Oncology | Admitting: Radiation Oncology

## 2015-07-09 DIAGNOSIS — J069 Acute upper respiratory infection, unspecified: Secondary | ICD-10-CM | POA: Diagnosis not present

## 2015-07-09 DIAGNOSIS — M1990 Unspecified osteoarthritis, unspecified site: Secondary | ICD-10-CM | POA: Diagnosis not present

## 2015-07-09 DIAGNOSIS — I10 Essential (primary) hypertension: Secondary | ICD-10-CM | POA: Diagnosis not present

## 2015-07-09 DIAGNOSIS — C61 Malignant neoplasm of prostate: Secondary | ICD-10-CM | POA: Diagnosis not present

## 2015-07-09 DIAGNOSIS — Z510 Encounter for antineoplastic radiation therapy: Secondary | ICD-10-CM | POA: Diagnosis not present

## 2015-07-10 ENCOUNTER — Ambulatory Visit: Payer: Medicare Other | Admitting: Radiation Oncology

## 2015-07-10 ENCOUNTER — Ambulatory Visit
Admission: RE | Admit: 2015-07-10 | Discharge: 2015-07-10 | Disposition: A | Discharge status: Home / Self Care | Payer: Medicare Other | Source: Ambulatory Visit | Attending: Radiation Oncology | Admitting: Radiation Oncology

## 2015-07-10 ENCOUNTER — Ambulatory Visit: Payer: Medicare Other

## 2015-07-10 DIAGNOSIS — J069 Acute upper respiratory infection, unspecified: Secondary | ICD-10-CM | POA: Diagnosis not present

## 2015-07-10 DIAGNOSIS — Z510 Encounter for antineoplastic radiation therapy: Secondary | ICD-10-CM | POA: Diagnosis not present

## 2015-07-10 DIAGNOSIS — C61 Malignant neoplasm of prostate: Secondary | ICD-10-CM | POA: Diagnosis not present

## 2015-07-10 DIAGNOSIS — M1990 Unspecified osteoarthritis, unspecified site: Secondary | ICD-10-CM | POA: Diagnosis not present

## 2015-07-10 DIAGNOSIS — I10 Essential (primary) hypertension: Secondary | ICD-10-CM | POA: Diagnosis not present

## 2015-07-11 ENCOUNTER — Ambulatory Visit: Payer: Medicare Other | Admitting: Radiation Oncology

## 2015-07-11 ENCOUNTER — Ambulatory Visit: Payer: Medicare Other

## 2015-07-11 ENCOUNTER — Ambulatory Visit
Admission: RE | Admit: 2015-07-11 | Discharge: 2015-07-11 | Disposition: A | Discharge status: Home / Self Care | Payer: Medicare Other | Source: Ambulatory Visit | Attending: Radiation Oncology | Admitting: Radiation Oncology

## 2015-07-11 DIAGNOSIS — I10 Essential (primary) hypertension: Secondary | ICD-10-CM | POA: Diagnosis not present

## 2015-07-11 DIAGNOSIS — Z510 Encounter for antineoplastic radiation therapy: Secondary | ICD-10-CM | POA: Diagnosis not present

## 2015-07-11 DIAGNOSIS — J069 Acute upper respiratory infection, unspecified: Secondary | ICD-10-CM | POA: Diagnosis not present

## 2015-07-11 DIAGNOSIS — M1990 Unspecified osteoarthritis, unspecified site: Secondary | ICD-10-CM | POA: Diagnosis not present

## 2015-07-11 DIAGNOSIS — C61 Malignant neoplasm of prostate: Secondary | ICD-10-CM | POA: Diagnosis not present

## 2015-07-12 ENCOUNTER — Ambulatory Visit: Payer: Medicare Other

## 2015-07-12 ENCOUNTER — Ambulatory Visit
Admission: RE | Admit: 2015-07-12 | Discharge: 2015-07-12 | Disposition: A | Discharge status: Home / Self Care | Payer: Medicare Other | Source: Ambulatory Visit | Attending: Radiation Oncology | Admitting: Radiation Oncology

## 2015-07-12 ENCOUNTER — Inpatient Hospital Stay
Admission: RE | Admit: 2015-07-12 | Discharge: 2015-07-12 | Disposition: A | Discharge status: Home / Self Care | Payer: Self-pay | Source: Ambulatory Visit | Attending: Radiation Oncology | Admitting: Radiation Oncology

## 2015-07-12 ENCOUNTER — Ambulatory Visit: Payer: Medicare Other | Admitting: Radiation Oncology

## 2015-07-12 DIAGNOSIS — Z510 Encounter for antineoplastic radiation therapy: Secondary | ICD-10-CM | POA: Diagnosis not present

## 2015-07-12 DIAGNOSIS — C61 Malignant neoplasm of prostate: Secondary | ICD-10-CM | POA: Diagnosis not present

## 2015-07-12 DIAGNOSIS — J069 Acute upper respiratory infection, unspecified: Secondary | ICD-10-CM | POA: Diagnosis not present

## 2015-07-12 DIAGNOSIS — M1990 Unspecified osteoarthritis, unspecified site: Secondary | ICD-10-CM | POA: Diagnosis not present

## 2015-07-12 DIAGNOSIS — I10 Essential (primary) hypertension: Secondary | ICD-10-CM | POA: Diagnosis not present

## 2015-07-13 ENCOUNTER — Ambulatory Visit: Payer: Medicare Other | Admitting: Radiation Oncology

## 2015-07-13 ENCOUNTER — Ambulatory Visit: Payer: Medicare Other

## 2015-07-13 ENCOUNTER — Ambulatory Visit
Admission: RE | Admit: 2015-07-13 | Discharge: 2015-07-13 | Disposition: A | Discharge status: Home / Self Care | Payer: Medicare Other | Source: Ambulatory Visit | Attending: Radiation Oncology | Admitting: Radiation Oncology

## 2015-07-13 ENCOUNTER — Encounter: Payer: Self-pay | Admitting: Radiation Oncology

## 2015-07-13 VITALS — BP 132/63 | HR 57 | Resp 16 | Wt 189.2 lb

## 2015-07-13 DIAGNOSIS — M1990 Unspecified osteoarthritis, unspecified site: Secondary | ICD-10-CM | POA: Diagnosis not present

## 2015-07-13 DIAGNOSIS — Z510 Encounter for antineoplastic radiation therapy: Secondary | ICD-10-CM | POA: Diagnosis not present

## 2015-07-13 DIAGNOSIS — C61 Malignant neoplasm of prostate: Secondary | ICD-10-CM

## 2015-07-13 DIAGNOSIS — I10 Essential (primary) hypertension: Secondary | ICD-10-CM | POA: Diagnosis not present

## 2015-07-13 DIAGNOSIS — J069 Acute upper respiratory infection, unspecified: Secondary | ICD-10-CM | POA: Diagnosis not present

## 2015-07-13 NOTE — Progress Notes (Signed)
Weight and vitals stable. Denies pain. Denies dysuria or hematuria. Reports nocturia x2. Reports a strong steady urine stream. Denies difficulty emptying his bladder. Denies diarrhea. Denies fatigue.   BP 132/63 mmHg  Pulse 57  Resp 16  Wt 189 lb 3.2 oz (85.821 kg)  SpO2 100% Wt Readings from Last 3 Encounters:  07/13/15 189 lb 3.2 oz (85.821 kg)  07/05/15 190 lb 8 oz (86.41 kg)  06/13/15 187 lb 3.2 oz (84.913 kg)

## 2015-07-13 NOTE — Progress Notes (Signed)
  Radiation Oncology         [id number]   Name: [name] MRN: [id number]   Date: 07/13/2015  DOB: [birth year]    Weekly Radiation Therapy Management    ICD-9-CM ICD-10-CM   1. Prostate cancer ([place]) 185 C61     Current Dose: 18 Gy  Planned Dose:  75 Gy  Narrative The patient presents for routine under treatment assessment.  Weight and vitals stable. Denies pain. Denies dysuria or hematuria. Reports nocturia x2. Reports a strong steady urine stream. Denies difficulty emptying his bladder. Denies diarrhea. Denies fatigue.  Set-up films were reviewed. The chart was checked.  Physical Findings  weight is 189 lb 3.2 oz (85.821 kg). His blood pressure is 132/63 and his pulse is 57. His respiration is 16 and oxygen saturation is 100%.  Weight essentially stable. This is a pleasant male in no acute distress. He is alert and oriented.   Impression The patient is tolerating radiation.  Plan Continue treatment as planned.     Sheral Apley Tammi Klippel, M.D.   This document serves as a record of services personally performed by [name], MD. It was created on his behalf by Derek Mound, a trained medical scribe. The creation of this record is based on the scribe's personal observations and the provider's statements to them. This document has been checked and approved by the attending provider.

## 2015-07-16 ENCOUNTER — Ambulatory Visit: Payer: Medicare Other

## 2015-07-16 ENCOUNTER — Ambulatory Visit
Admission: RE | Admit: 2015-07-16 | Discharge: 2015-07-16 | Disposition: A | Discharge status: Home / Self Care | Payer: Medicare Other | Source: Ambulatory Visit | Attending: Radiation Oncology | Admitting: Radiation Oncology

## 2015-07-16 ENCOUNTER — Ambulatory Visit: Payer: Medicare Other | Admitting: Radiation Oncology

## 2015-07-16 DIAGNOSIS — Z510 Encounter for antineoplastic radiation therapy: Secondary | ICD-10-CM | POA: Diagnosis not present

## 2015-07-16 DIAGNOSIS — M1990 Unspecified osteoarthritis, unspecified site: Secondary | ICD-10-CM | POA: Diagnosis not present

## 2015-07-16 DIAGNOSIS — I10 Essential (primary) hypertension: Secondary | ICD-10-CM | POA: Diagnosis not present

## 2015-07-16 DIAGNOSIS — C61 Malignant neoplasm of prostate: Secondary | ICD-10-CM | POA: Diagnosis not present

## 2015-07-16 DIAGNOSIS — J069 Acute upper respiratory infection, unspecified: Secondary | ICD-10-CM | POA: Diagnosis not present

## 2015-07-17 ENCOUNTER — Ambulatory Visit
Admission: RE | Admit: 2015-07-17 | Discharge: 2015-07-17 | Disposition: A | Discharge status: Home / Self Care | Payer: Medicare Other | Source: Ambulatory Visit | Attending: Radiation Oncology | Admitting: Radiation Oncology

## 2015-07-17 ENCOUNTER — Ambulatory Visit: Payer: Medicare Other

## 2015-07-17 ENCOUNTER — Ambulatory Visit: Payer: Medicare Other | Admitting: Radiation Oncology

## 2015-07-17 DIAGNOSIS — J069 Acute upper respiratory infection, unspecified: Secondary | ICD-10-CM | POA: Diagnosis not present

## 2015-07-17 DIAGNOSIS — Z510 Encounter for antineoplastic radiation therapy: Secondary | ICD-10-CM | POA: Diagnosis not present

## 2015-07-17 DIAGNOSIS — C61 Malignant neoplasm of prostate: Secondary | ICD-10-CM | POA: Diagnosis not present

## 2015-07-17 DIAGNOSIS — I10 Essential (primary) hypertension: Secondary | ICD-10-CM | POA: Diagnosis not present

## 2015-07-17 DIAGNOSIS — M1990 Unspecified osteoarthritis, unspecified site: Secondary | ICD-10-CM | POA: Diagnosis not present

## 2015-07-18 ENCOUNTER — Encounter: Payer: Self-pay | Admitting: Medical Oncology

## 2015-07-18 ENCOUNTER — Ambulatory Visit: Payer: Medicare Other | Admitting: Radiation Oncology

## 2015-07-18 ENCOUNTER — Ambulatory Visit: Payer: Medicare Other

## 2015-07-18 ENCOUNTER — Ambulatory Visit
Admission: RE | Admit: 2015-07-18 | Discharge: 2015-07-18 | Disposition: A | Discharge status: Home / Self Care | Payer: Medicare Other | Source: Ambulatory Visit | Attending: Radiation Oncology | Admitting: Radiation Oncology

## 2015-07-18 DIAGNOSIS — C61 Malignant neoplasm of prostate: Secondary | ICD-10-CM | POA: Diagnosis not present

## 2015-07-18 DIAGNOSIS — I10 Essential (primary) hypertension: Secondary | ICD-10-CM | POA: Diagnosis not present

## 2015-07-18 DIAGNOSIS — J069 Acute upper respiratory infection, unspecified: Secondary | ICD-10-CM | POA: Diagnosis not present

## 2015-07-18 DIAGNOSIS — Z510 Encounter for antineoplastic radiation therapy: Secondary | ICD-10-CM | POA: Diagnosis not present

## 2015-07-18 DIAGNOSIS — M1990 Unspecified osteoarthritis, unspecified site: Secondary | ICD-10-CM | POA: Diagnosis not present

## 2015-07-18 NOTE — Progress Notes (Signed)
Oncology Nurse Navigator Documentation  Oncology Nurse Navigator Flowsheets 07/04/2015 07/18/2015  Navigator Location [facility] -  Navigator Encounter Type Treatment Treatment- Mr. Abresch states that he is doing well with is radiation treatments.He reports no side effects at this time. I asked him to call me with any questions or concerns. He voiced understanding.  Abnormal Finding Date 05/09/2015 -  Confirmed Diagnosis Date (No Data) -  Surgery Date (No Data) -  Treatment Initiated Date 06/20/2015 -  Patient Visit Type RadOnc RadOnc  Treatment Phase Treatment Treatment  Barriers/Navigation Needs No barriers at this time No barriers at this time  Interventions None required -  Support Groups/Services Prostate Support Group Prostate Support Group  Acuity Level 1 -  Acuity Level 1 Initial guidance, education and coordination as needed -  Time Spent with Patient 15 15

## 2015-07-19 ENCOUNTER — Encounter: Payer: Self-pay | Admitting: Radiation Oncology

## 2015-07-19 ENCOUNTER — Ambulatory Visit: Payer: Medicare Other

## 2015-07-19 ENCOUNTER — Ambulatory Visit
Admission: RE | Admit: 2015-07-19 | Discharge: 2015-07-19 | Disposition: A | Discharge status: Home / Self Care | Payer: Medicare Other | Source: Ambulatory Visit | Attending: Radiation Oncology | Admitting: Radiation Oncology

## 2015-07-19 ENCOUNTER — Ambulatory Visit: Payer: Medicare Other | Admitting: Radiation Oncology

## 2015-07-19 VITALS — BP 123/75 | HR 55 | Resp 16 | Wt 187.6 lb

## 2015-07-19 DIAGNOSIS — M1990 Unspecified osteoarthritis, unspecified site: Secondary | ICD-10-CM | POA: Diagnosis not present

## 2015-07-19 DIAGNOSIS — C61 Malignant neoplasm of prostate: Secondary | ICD-10-CM

## 2015-07-19 DIAGNOSIS — I10 Essential (primary) hypertension: Secondary | ICD-10-CM | POA: Diagnosis not present

## 2015-07-19 DIAGNOSIS — J069 Acute upper respiratory infection, unspecified: Secondary | ICD-10-CM | POA: Diagnosis not present

## 2015-07-19 DIAGNOSIS — Z510 Encounter for antineoplastic radiation therapy: Secondary | ICD-10-CM | POA: Diagnosis not present

## 2015-07-19 NOTE — Progress Notes (Signed)
  Radiation Oncology         [id number]   Name: [name] MRN: [id number]   Date: 07/19/2015  DOB: 12/30/1945    Weekly Radiation Therapy Management    ICD-9-CM ICD-10-CM   1. Prostate cancer ([place]) 185 C61     Current Dose: 25.2 Gy  Planned Dose:  75 Gy  Narrative The patient presents for routine under treatment assessment.  Denies pain, dysuria, hematuria, difficulty emptying his bladder, diarrhea, or fatigue. He reports having an episode of diarrhea last Saturday that he attributes to diet. Reports nocturia x2. Reports a strong steady urine stream.  Set-up films were reviewed. The chart was checked.  Physical Findings  weight is 187 lb 9.6 oz (85.095 kg). His blood pressure is 123/75 and his pulse is 55. His respiration is 16 and oxygen saturation is 100%.  Weight essentially stable. This is a pleasant male in no acute distress. He is alert and oriented.   Impression The patient is tolerating radiation.  Plan Continue treatment as planned. I advised the patient to have imodium on hand for episodes of diarrhea.   ------------------------------------------------   [name], MD [place] Director and Director of Stereotactic Radiosurgery Direct Dial: [phone]  Fax: [phone] [email or web address]  Skype  [email or web address]   This document serves as a record of services personally performed by [name], MD. It was created on their behalf by Darcus Austin, a trained medical scribe. The creation of this record is based on the scribe's personal observations and the providers' statements to them. This document has been checked and approved by the attending provider.

## 2015-07-19 NOTE — Progress Notes (Signed)
Weight and vitals stable. Denies pain. Denies dysuria or hematuria. Reports nocturia x2. Reports a strong steady urine stream. Denies difficulty emptying his bladder. Denies diarrhea. Denies fatigue.  BP 123/75 mmHg  Pulse 55  Resp 16  Wt 187 lb 9.6 oz (85.095 kg)  SpO2 100%' Wt Readings from Last 3 Encounters:  07/19/15 187 lb 9.6 oz (85.095 kg)  07/13/15 189 lb 3.2 oz (85.821 kg)  07/05/15 190 lb 8 oz (86.41 kg)

## 2015-07-20 ENCOUNTER — Ambulatory Visit: Payer: Medicare Other | Admitting: Radiation Oncology

## 2015-07-20 ENCOUNTER — Ambulatory Visit: Payer: Medicare Other

## 2015-07-20 ENCOUNTER — Ambulatory Visit
Admission: RE | Admit: 2015-07-20 | Discharge: 2015-07-20 | Disposition: A | Discharge status: Home / Self Care | Payer: Medicare Other | Source: Ambulatory Visit | Attending: Radiation Oncology | Admitting: Radiation Oncology

## 2015-07-20 DIAGNOSIS — I10 Essential (primary) hypertension: Secondary | ICD-10-CM | POA: Diagnosis not present

## 2015-07-20 DIAGNOSIS — Z510 Encounter for antineoplastic radiation therapy: Secondary | ICD-10-CM | POA: Diagnosis not present

## 2015-07-20 DIAGNOSIS — J069 Acute upper respiratory infection, unspecified: Secondary | ICD-10-CM | POA: Diagnosis not present

## 2015-07-20 DIAGNOSIS — M1990 Unspecified osteoarthritis, unspecified site: Secondary | ICD-10-CM | POA: Diagnosis not present

## 2015-07-20 DIAGNOSIS — C61 Malignant neoplasm of prostate: Secondary | ICD-10-CM | POA: Diagnosis not present

## 2015-07-24 ENCOUNTER — Ambulatory Visit: Payer: Medicare Other

## 2015-07-24 ENCOUNTER — Ambulatory Visit
Admission: RE | Admit: 2015-07-24 | Discharge: 2015-07-24 | Disposition: A | Discharge status: Home / Self Care | Payer: Medicare Other | Source: Ambulatory Visit | Attending: Radiation Oncology | Admitting: Radiation Oncology

## 2015-07-24 ENCOUNTER — Ambulatory Visit: Payer: Medicare Other | Admitting: Radiation Oncology

## 2015-07-24 DIAGNOSIS — I10 Essential (primary) hypertension: Secondary | ICD-10-CM | POA: Diagnosis not present

## 2015-07-24 DIAGNOSIS — J069 Acute upper respiratory infection, unspecified: Secondary | ICD-10-CM | POA: Diagnosis not present

## 2015-07-24 DIAGNOSIS — Z510 Encounter for antineoplastic radiation therapy: Secondary | ICD-10-CM | POA: Diagnosis not present

## 2015-07-24 DIAGNOSIS — M1990 Unspecified osteoarthritis, unspecified site: Secondary | ICD-10-CM | POA: Diagnosis not present

## 2015-07-24 DIAGNOSIS — C61 Malignant neoplasm of prostate: Secondary | ICD-10-CM | POA: Diagnosis not present

## 2015-07-25 ENCOUNTER — Ambulatory Visit: Payer: Medicare Other | Admitting: Radiation Oncology

## 2015-07-25 ENCOUNTER — Ambulatory Visit
Admission: RE | Admit: 2015-07-25 | Discharge: 2015-07-25 | Disposition: A | Discharge status: Home / Self Care | Payer: Medicare Other | Source: Ambulatory Visit | Attending: Radiation Oncology | Admitting: Radiation Oncology

## 2015-07-25 ENCOUNTER — Ambulatory Visit: Payer: Medicare Other

## 2015-07-25 DIAGNOSIS — M1990 Unspecified osteoarthritis, unspecified site: Secondary | ICD-10-CM | POA: Diagnosis not present

## 2015-07-25 DIAGNOSIS — Z510 Encounter for antineoplastic radiation therapy: Secondary | ICD-10-CM | POA: Diagnosis not present

## 2015-07-25 DIAGNOSIS — C61 Malignant neoplasm of prostate: Secondary | ICD-10-CM | POA: Diagnosis not present

## 2015-07-25 DIAGNOSIS — I10 Essential (primary) hypertension: Secondary | ICD-10-CM | POA: Diagnosis not present

## 2015-07-25 DIAGNOSIS — J069 Acute upper respiratory infection, unspecified: Secondary | ICD-10-CM | POA: Diagnosis not present

## 2015-07-26 ENCOUNTER — Ambulatory Visit: Payer: Medicare Other | Admitting: Radiation Oncology

## 2015-07-26 ENCOUNTER — Ambulatory Visit: Payer: Medicare Other

## 2015-07-26 ENCOUNTER — Ambulatory Visit
Admission: RE | Admit: 2015-07-26 | Discharge: 2015-07-26 | Disposition: A | Discharge status: Home / Self Care | Payer: Medicare Other | Source: Ambulatory Visit | Attending: Radiation Oncology | Admitting: Radiation Oncology

## 2015-07-26 DIAGNOSIS — I10 Essential (primary) hypertension: Secondary | ICD-10-CM | POA: Diagnosis not present

## 2015-07-26 DIAGNOSIS — M1990 Unspecified osteoarthritis, unspecified site: Secondary | ICD-10-CM | POA: Diagnosis not present

## 2015-07-26 DIAGNOSIS — J069 Acute upper respiratory infection, unspecified: Secondary | ICD-10-CM | POA: Diagnosis not present

## 2015-07-26 DIAGNOSIS — Z510 Encounter for antineoplastic radiation therapy: Secondary | ICD-10-CM | POA: Diagnosis not present

## 2015-07-26 DIAGNOSIS — C61 Malignant neoplasm of prostate: Secondary | ICD-10-CM | POA: Diagnosis not present

## 2015-07-27 ENCOUNTER — Ambulatory Visit: Payer: Medicare Other

## 2015-07-27 ENCOUNTER — Ambulatory Visit
Admission: RE | Admit: 2015-07-27 | Discharge: 2015-07-27 | Disposition: A | Discharge status: Home / Self Care | Payer: Medicare Other | Source: Ambulatory Visit | Attending: Radiation Oncology | Admitting: Radiation Oncology

## 2015-07-27 ENCOUNTER — Ambulatory Visit: Payer: Medicare Other | Admitting: Radiation Oncology

## 2015-07-27 VITALS — BP 142/69 | HR 62 | Resp 16 | Wt 188.1 lb

## 2015-07-27 DIAGNOSIS — C61 Malignant neoplasm of prostate: Secondary | ICD-10-CM

## 2015-07-27 DIAGNOSIS — M1990 Unspecified osteoarthritis, unspecified site: Secondary | ICD-10-CM | POA: Diagnosis not present

## 2015-07-27 DIAGNOSIS — I10 Essential (primary) hypertension: Secondary | ICD-10-CM | POA: Diagnosis not present

## 2015-07-27 DIAGNOSIS — J069 Acute upper respiratory infection, unspecified: Secondary | ICD-10-CM | POA: Diagnosis not present

## 2015-07-27 DIAGNOSIS — Z510 Encounter for antineoplastic radiation therapy: Secondary | ICD-10-CM | POA: Diagnosis not present

## 2015-07-27 NOTE — Progress Notes (Signed)
Weight and vitals stable. Denies pain. Denies dysuria or hematuria. Reports nocturia x2. Reports a strong steady urine stream. Denies difficulty emptying his bladder. Denies diarrhea. Reports mild fatigue.   BP 142/69 mmHg  Pulse 62  Resp 16  Wt 188 lb 1.6 oz (85.322 kg)  SpO2 100% Wt Readings from Last 3 Encounters:  07/27/15 188 lb 1.6 oz (85.322 kg)  07/19/15 187 lb 9.6 oz (85.095 kg)  07/13/15 189 lb 3.2 oz (85.821 kg)

## 2015-07-27 NOTE — Progress Notes (Signed)
  Radiation Oncology         [phone]   Name: [name] MRN: [id number]   Date: 07/27/2015  DOB: Aug 05, 1945    Weekly Radiation Therapy Management    ICD-9-CM ICD-10-CM   1. Prostate cancer ([place]) 185 C61     Current Dose: 34.2 Gy  Planned Dose:  75 Gy  Narrative The patient presents for routine under treatment assessment.  Weight and vitals stable. Denies pain. Denies dysuria or hematuria. Reports nocturia x2. Reports a strong steady urine stream. Denies difficulty emptying his bladder. Reports mild fatigue. Mentions he had some diarrhea over the weekend but attributes that to what he ate.  Set-up films were reviewed. The chart was checked.  Physical Findings  weight is 188 lb 1.6 oz (85.322 kg). His blood pressure is 142/69 and his pulse is 62. His respiration is 16 and oxygen saturation is 100%.  Weight essentially stable. This is a pleasant male in no acute distress. He is alert and oriented.   Impression The patient is tolerating radiation.  Plan Continue treatment as planned.   ------------------------------------------------   [name], MD    This document serves as a record of services personally performed by [name], MD. It was created on his behalf by Lendon Collar, a trained medical scribe. The creation of this record is based on the scribe's personal observations and the provider's statements to them. This document has been checked and approved by the attending provider.

## 2015-07-30 ENCOUNTER — Ambulatory Visit: Payer: Medicare Other | Admitting: Radiation Oncology

## 2015-07-30 ENCOUNTER — Ambulatory Visit
Admission: RE | Admit: 2015-07-30 | Discharge: 2015-07-30 | Disposition: A | Discharge status: Home / Self Care | Payer: Medicare Other | Source: Ambulatory Visit | Attending: Radiation Oncology | Admitting: Radiation Oncology

## 2015-07-30 ENCOUNTER — Ambulatory Visit: Payer: Medicare Other

## 2015-07-30 DIAGNOSIS — J069 Acute upper respiratory infection, unspecified: Secondary | ICD-10-CM | POA: Diagnosis not present

## 2015-07-30 DIAGNOSIS — C61 Malignant neoplasm of prostate: Secondary | ICD-10-CM | POA: Diagnosis not present

## 2015-07-30 DIAGNOSIS — Z510 Encounter for antineoplastic radiation therapy: Secondary | ICD-10-CM | POA: Diagnosis not present

## 2015-07-30 DIAGNOSIS — I10 Essential (primary) hypertension: Secondary | ICD-10-CM | POA: Diagnosis not present

## 2015-07-30 DIAGNOSIS — M1990 Unspecified osteoarthritis, unspecified site: Secondary | ICD-10-CM | POA: Diagnosis not present

## 2015-07-31 ENCOUNTER — Ambulatory Visit: Payer: Medicare Other

## 2015-07-31 ENCOUNTER — Ambulatory Visit
Admission: RE | Admit: 2015-07-31 | Discharge: 2015-07-31 | Disposition: A | Discharge status: Home / Self Care | Payer: Medicare Other | Source: Ambulatory Visit | Attending: Radiation Oncology | Admitting: Radiation Oncology

## 2015-07-31 ENCOUNTER — Ambulatory Visit: Payer: Medicare Other | Admitting: Radiation Oncology

## 2015-07-31 DIAGNOSIS — M1990 Unspecified osteoarthritis, unspecified site: Secondary | ICD-10-CM | POA: Diagnosis not present

## 2015-07-31 DIAGNOSIS — Z510 Encounter for antineoplastic radiation therapy: Secondary | ICD-10-CM | POA: Diagnosis not present

## 2015-07-31 DIAGNOSIS — I10 Essential (primary) hypertension: Secondary | ICD-10-CM | POA: Diagnosis not present

## 2015-07-31 DIAGNOSIS — J069 Acute upper respiratory infection, unspecified: Secondary | ICD-10-CM | POA: Diagnosis not present

## 2015-07-31 DIAGNOSIS — C61 Malignant neoplasm of prostate: Secondary | ICD-10-CM | POA: Diagnosis not present

## 2015-08-01 ENCOUNTER — Ambulatory Visit: Payer: Medicare Other | Admitting: Radiation Oncology

## 2015-08-01 ENCOUNTER — Ambulatory Visit: Payer: Medicare Other

## 2015-08-01 ENCOUNTER — Ambulatory Visit
Admission: RE | Admit: 2015-08-01 | Discharge: 2015-08-01 | Disposition: A | Discharge status: Home / Self Care | Payer: Medicare Other | Source: Ambulatory Visit | Attending: Radiation Oncology | Admitting: Radiation Oncology

## 2015-08-01 DIAGNOSIS — J069 Acute upper respiratory infection, unspecified: Secondary | ICD-10-CM | POA: Diagnosis not present

## 2015-08-01 DIAGNOSIS — C61 Malignant neoplasm of prostate: Secondary | ICD-10-CM | POA: Diagnosis not present

## 2015-08-01 DIAGNOSIS — M1990 Unspecified osteoarthritis, unspecified site: Secondary | ICD-10-CM | POA: Diagnosis not present

## 2015-08-01 DIAGNOSIS — I10 Essential (primary) hypertension: Secondary | ICD-10-CM | POA: Diagnosis not present

## 2015-08-01 DIAGNOSIS — Z510 Encounter for antineoplastic radiation therapy: Secondary | ICD-10-CM | POA: Diagnosis not present

## 2015-08-02 ENCOUNTER — Ambulatory Visit: Payer: Medicare Other | Admitting: Radiation Oncology

## 2015-08-02 ENCOUNTER — Ambulatory Visit
Admission: RE | Admit: 2015-08-02 | Discharge: 2015-08-02 | Disposition: A | Discharge status: Home / Self Care | Payer: Medicare Other | Source: Ambulatory Visit | Attending: Radiation Oncology | Admitting: Radiation Oncology

## 2015-08-02 ENCOUNTER — Encounter: Payer: Self-pay | Admitting: Radiation Oncology

## 2015-08-02 ENCOUNTER — Ambulatory Visit: Payer: Medicare Other

## 2015-08-02 VITALS — BP 124/65 | HR 59 | Resp 16 | Wt 188.6 lb

## 2015-08-02 DIAGNOSIS — J069 Acute upper respiratory infection, unspecified: Secondary | ICD-10-CM | POA: Diagnosis not present

## 2015-08-02 DIAGNOSIS — I10 Essential (primary) hypertension: Secondary | ICD-10-CM | POA: Diagnosis not present

## 2015-08-02 DIAGNOSIS — C61 Malignant neoplasm of prostate: Secondary | ICD-10-CM | POA: Diagnosis not present

## 2015-08-02 DIAGNOSIS — M1990 Unspecified osteoarthritis, unspecified site: Secondary | ICD-10-CM | POA: Diagnosis not present

## 2015-08-02 DIAGNOSIS — Z510 Encounter for antineoplastic radiation therapy: Secondary | ICD-10-CM | POA: Diagnosis not present

## 2015-08-02 NOTE — Progress Notes (Addendum)
Weight and vitals stable. Denies pain. Denies dysuria or hematuria. Reports nocturia x2. Reports a strong steady urine stream. Denies difficulty emptying his bladder. Denies diarrhea. Reports mild fatigue.  BP 124/65 mmHg  Pulse 59  Resp 16  Wt 178 lb 6.4 oz (80.922 kg)  SpO2 100%  Wt Readings from Last 3 Encounters:  08/02/15 188 lb 9.6 oz (85.548 kg)  07/27/15 188 lb 1.6 oz (85.322 kg)  07/19/15 187 lb 9.6 oz (85.095 kg)

## 2015-08-02 NOTE — Progress Notes (Signed)
  Radiation Oncology         [phone]   Name: [name] MRN: [id number]   Date: 08/02/2015  DOB: 01-03-1946    Weekly Radiation Therapy Management    ICD-9-CM ICD-10-CM   1. Prostate cancer ([place]) 185 C61     Current Dose: 41.4 Gy  Planned Dose:  75 Gy  Narrative The patient presents for routine under treatment assessment.  Weight and vitals stable. Denies pain. Denies dysuria or hematuria. Reports nocturia x2. Reports a strong steady urine stream. Denies difficulty emptying his bladder or diarrhea. Reports mild fatigue.  Set-up films were reviewed. The chart was checked.  Physical Findings  weight is 188 lb 9.6 oz (85.548 kg). His blood pressure is 124/65 and his pulse is 59. His respiration is 16 and oxygen saturation is 100%.  Weight essentially stable. This is a pleasant male in no acute distress. He is alert and oriented.   Impression The patient is tolerating radiation.  Plan Continue treatment as planned.   ------------------------------------------------   [name], MD  This document serves as a record of services personally performed by [name], MD. It was created on his behalf by Darcus Austin, a trained medical scribe. The creation of this record is based on the scribe's personal observations and the provider's statements to them. This document has been checked and approved by the attending provider.

## 2015-08-03 ENCOUNTER — Ambulatory Visit: Payer: Medicare Other | Admitting: Radiation Oncology

## 2015-08-03 ENCOUNTER — Ambulatory Visit: Payer: Medicare Other

## 2015-08-03 ENCOUNTER — Ambulatory Visit
Admission: RE | Admit: 2015-08-03 | Discharge: 2015-08-03 | Disposition: A | Discharge status: Home / Self Care | Payer: Medicare Other | Source: Ambulatory Visit | Attending: Radiation Oncology | Admitting: Radiation Oncology

## 2015-08-03 DIAGNOSIS — M1990 Unspecified osteoarthritis, unspecified site: Secondary | ICD-10-CM | POA: Diagnosis not present

## 2015-08-03 DIAGNOSIS — J069 Acute upper respiratory infection, unspecified: Secondary | ICD-10-CM | POA: Diagnosis not present

## 2015-08-03 DIAGNOSIS — C61 Malignant neoplasm of prostate: Secondary | ICD-10-CM | POA: Diagnosis not present

## 2015-08-03 DIAGNOSIS — I10 Essential (primary) hypertension: Secondary | ICD-10-CM | POA: Diagnosis not present

## 2015-08-03 DIAGNOSIS — Z510 Encounter for antineoplastic radiation therapy: Secondary | ICD-10-CM | POA: Diagnosis not present

## 2015-08-06 ENCOUNTER — Ambulatory Visit: Payer: Medicare Other

## 2015-08-06 ENCOUNTER — Ambulatory Visit: Payer: Medicare Other | Admitting: Radiation Oncology

## 2015-08-06 DIAGNOSIS — J069 Acute upper respiratory infection, unspecified: Secondary | ICD-10-CM | POA: Diagnosis not present

## 2015-08-06 DIAGNOSIS — M1990 Unspecified osteoarthritis, unspecified site: Secondary | ICD-10-CM | POA: Diagnosis not present

## 2015-08-06 DIAGNOSIS — Z510 Encounter for antineoplastic radiation therapy: Secondary | ICD-10-CM | POA: Diagnosis not present

## 2015-08-06 DIAGNOSIS — I10 Essential (primary) hypertension: Secondary | ICD-10-CM | POA: Diagnosis not present

## 2015-08-06 DIAGNOSIS — C61 Malignant neoplasm of prostate: Secondary | ICD-10-CM | POA: Diagnosis not present

## 2015-08-07 ENCOUNTER — Ambulatory Visit
Admission: RE | Admit: 2015-08-07 | Discharge: 2015-08-07 | Disposition: A | Discharge status: Home / Self Care | Payer: Medicare Other | Source: Ambulatory Visit | Attending: Radiation Oncology | Admitting: Radiation Oncology

## 2015-08-07 ENCOUNTER — Ambulatory Visit: Payer: Medicare Other

## 2015-08-07 ENCOUNTER — Ambulatory Visit: Payer: Medicare Other | Admitting: Radiation Oncology

## 2015-08-07 DIAGNOSIS — M1990 Unspecified osteoarthritis, unspecified site: Secondary | ICD-10-CM | POA: Diagnosis not present

## 2015-08-07 DIAGNOSIS — C61 Malignant neoplasm of prostate: Secondary | ICD-10-CM | POA: Diagnosis not present

## 2015-08-07 DIAGNOSIS — Z510 Encounter for antineoplastic radiation therapy: Secondary | ICD-10-CM | POA: Diagnosis not present

## 2015-08-07 DIAGNOSIS — I10 Essential (primary) hypertension: Secondary | ICD-10-CM | POA: Diagnosis not present

## 2015-08-07 DIAGNOSIS — J069 Acute upper respiratory infection, unspecified: Secondary | ICD-10-CM | POA: Diagnosis not present

## 2015-08-08 ENCOUNTER — Ambulatory Visit: Payer: Medicare Other

## 2015-08-08 ENCOUNTER — Ambulatory Visit
Admission: RE | Admit: 2015-08-08 | Discharge: 2015-08-08 | Disposition: A | Discharge status: Home / Self Care | Payer: Medicare Other | Source: Ambulatory Visit | Attending: Radiation Oncology | Admitting: Radiation Oncology

## 2015-08-08 ENCOUNTER — Ambulatory Visit: Payer: Medicare Other | Admitting: Radiation Oncology

## 2015-08-08 DIAGNOSIS — I10 Essential (primary) hypertension: Secondary | ICD-10-CM | POA: Diagnosis not present

## 2015-08-08 DIAGNOSIS — M1990 Unspecified osteoarthritis, unspecified site: Secondary | ICD-10-CM | POA: Diagnosis not present

## 2015-08-08 DIAGNOSIS — C61 Malignant neoplasm of prostate: Secondary | ICD-10-CM | POA: Diagnosis not present

## 2015-08-08 DIAGNOSIS — Z510 Encounter for antineoplastic radiation therapy: Secondary | ICD-10-CM | POA: Diagnosis not present

## 2015-08-08 DIAGNOSIS — J069 Acute upper respiratory infection, unspecified: Secondary | ICD-10-CM | POA: Diagnosis not present

## 2015-08-09 ENCOUNTER — Ambulatory Visit: Payer: Medicare Other

## 2015-08-09 ENCOUNTER — Ambulatory Visit
Admission: RE | Admit: 2015-08-09 | Discharge: 2015-08-09 | Disposition: A | Discharge status: Home / Self Care | Payer: Medicare Other | Source: Ambulatory Visit | Attending: Radiation Oncology | Admitting: Radiation Oncology

## 2015-08-09 ENCOUNTER — Ambulatory Visit: Payer: Medicare Other | Admitting: Radiation Oncology

## 2015-08-09 ENCOUNTER — Encounter: Payer: Self-pay | Admitting: Medical Oncology

## 2015-08-09 DIAGNOSIS — M1990 Unspecified osteoarthritis, unspecified site: Secondary | ICD-10-CM | POA: Diagnosis not present

## 2015-08-09 DIAGNOSIS — Z510 Encounter for antineoplastic radiation therapy: Secondary | ICD-10-CM | POA: Diagnosis not present

## 2015-08-09 DIAGNOSIS — C61 Malignant neoplasm of prostate: Secondary | ICD-10-CM | POA: Diagnosis not present

## 2015-08-09 DIAGNOSIS — J069 Acute upper respiratory infection, unspecified: Secondary | ICD-10-CM | POA: Diagnosis not present

## 2015-08-09 DIAGNOSIS — I10 Essential (primary) hypertension: Secondary | ICD-10-CM | POA: Diagnosis not present

## 2015-08-09 NOTE — Progress Notes (Signed)
Oncology Nurse Navigator Documentation  Oncology Nurse Navigator Flowsheets 07/04/2015 07/18/2015 08/09/2015  Navigator Location [facility] - -  Navigator Encounter Type Treatment Treatment Treatment  Abnormal Finding Date 05/09/2015 - -  Confirmed Diagnosis Date (No Data) - -  Surgery Date (No Data) - -  Treatment Initiated Date 06/20/2015 - -  Patient Visit Type RadOnc RadOnc RadOnc  Treatment Phase Treatment Treatment Treatment  Barriers/Navigation Needs No barriers at this time No barriers at this time No barriers at this time- [name] states he is doing well with minimal side effects. He has nocturia x 2.  I did inform him that we are having prostate support group Monday June 19 th. He states that he plans to be there. I will continue to follow.  Interventions None required - -  Support Groups/Services Prostate Support Group Prostate Support Group Prostate Support Group  Acuity Level 1 - -  Acuity Level 1 Initial guidance, education and coordination as needed - -  Time Spent with Patient 15 15 15

## 2015-08-10 ENCOUNTER — Ambulatory Visit: Payer: Medicare Other | Admitting: Radiation Oncology

## 2015-08-10 ENCOUNTER — Ambulatory Visit
Admission: RE | Admit: 2015-08-10 | Discharge: 2015-08-10 | Disposition: A | Discharge status: Home / Self Care | Payer: Medicare Other | Source: Ambulatory Visit | Attending: Radiation Oncology | Admitting: Radiation Oncology

## 2015-08-10 ENCOUNTER — Ambulatory Visit: Payer: Medicare Other

## 2015-08-10 ENCOUNTER — Encounter: Payer: Self-pay | Admitting: Radiation Oncology

## 2015-08-10 VITALS — BP 133/75 | HR 57 | Resp 16 | Wt 189.1 lb

## 2015-08-10 DIAGNOSIS — Z510 Encounter for antineoplastic radiation therapy: Secondary | ICD-10-CM | POA: Diagnosis not present

## 2015-08-10 DIAGNOSIS — C61 Malignant neoplasm of prostate: Secondary | ICD-10-CM | POA: Diagnosis not present

## 2015-08-10 DIAGNOSIS — M1990 Unspecified osteoarthritis, unspecified site: Secondary | ICD-10-CM | POA: Diagnosis not present

## 2015-08-10 DIAGNOSIS — I10 Essential (primary) hypertension: Secondary | ICD-10-CM | POA: Diagnosis not present

## 2015-08-10 DIAGNOSIS — J069 Acute upper respiratory infection, unspecified: Secondary | ICD-10-CM | POA: Diagnosis not present

## 2015-08-10 NOTE — Progress Notes (Signed)
Weight and vitals stable. Denies pain. Denies dysuria or hematuria. Reports nocturia x 2-3 . Reports a strong steady urine stream. Denies difficulty emptying his bladder. Denies diarrhea. Reports mild fatigue.   BP 133/75 mmHg  Pulse 57  Resp 16  Wt 189 lb 1.6 oz (85.775 kg)  SpO2 100% Wt Readings from Last 3 Encounters:  08/10/15 189 lb 1.6 oz (85.775 kg)  08/02/15 188 lb 9.6 oz (85.548 kg)  07/27/15 188 lb 1.6 oz (85.322 kg)

## 2015-08-10 NOTE — Progress Notes (Addendum)
   Department of Radiation Oncology  Phone:  [phone] Fax:        [phone]  Weekly Treatment Note    Name: [name] Date: 08/11/2015 MRN: [id number] DOB: [birth year]   Diagnosis:     ICD-9-CM ICD-10-CM   1. Prostate cancer ([place]) 185 C61      Current dose: 52.2 Gy  Current fraction:29   MEDICATIONS: Current Outpatient Prescriptions  Medication Sig Dispense Refill  . ascorbic acid (VITAMIN C) 1000 MG tablet Take 1,000 mg by mouth 3 (three) times a week.    Marland Kitchen aspirin EC 81 MG tablet Take 81 mg by mouth daily.    . benazepril-hydrochlorthiazide (LOTENSIN HCT) 20-12.5 MG per tablet Take 1 tablet by mouth every morning.      . Calcium Carbonate-Vitamin D (CALCIUM 600+D3 PO) Take 1 tablet by mouth daily. Patient takes 1-2 tablets daily    . diphenhydrAMINE (BENADRYL) 25 MG tablet Take 25 mg by mouth every 8 (eight) hours as needed. For allergies    . Eszopiclone 3 MG TABS TAKE 1 TABLET IMMEDIATELY BEFORE BEDTIME  0  . Ibuprofen-Diphenhydramine Cit (ADVIL PM PO) Take 1 tablet by mouth at bedtime as needed.    Marland Kitchen MELATONIN ER PO Take by mouth.    . MULTIPLE VITAMIN PO Take 1 tablet by mouth daily.    . simvastatin (ZOCOR) 20 MG tablet Take 20 mg by mouth at bedtime.      No current facility-administered medications for this encounter.     ALLERGIES: Review of patient's allergies indicates no known allergies.   LABORATORY DATA:  Lab Results  Component Value Date   WBC 7.0 05/09/2015   HGB 14.2 05/09/2015   HCT 43.1 05/09/2015   MCV 88.8 05/09/2015   PLT 183 05/09/2015   Lab Results  Component Value Date   NA 141 05/09/2015   K 4.2 05/09/2015   CL 101 01/14/2011   CO2 29 05/09/2015   Lab Results  Component Value Date   ALT 19 05/09/2015   AST 27 05/09/2015   ALKPHOS 48 05/09/2015   BILITOT 0.59 05/09/2015     NARRATIVE: [name] was seen today for weekly treatment management. The chart was checked and the patient's films were reviewed.  The  patient denies pain, dysuria, hematuria, diarrhea, or difficulty emptying his bladder. He reports nocturia x 2-3, a strong steady urine stream, and mild fatigue. He reports having a sharp pain in his inner thigh this morning that went away with an Advil and a short walk.  PHYSICAL EXAMINATION: weight is 189 lb 1.6 oz (85.775 kg). His blood pressure is 133/75 and his pulse is 57. His respiration is 16 and oxygen saturation is 100%.   ASSESSMENT: The patient is doing satisfactorily with treatment.  PLAN: We will continue with the patient's radiation treatment as planned.  ------------------------------------------------  Jodelle Gross, MD, PhD  This document serves as a record of services personally performed by Kyung Rudd, MD. It was created on his behalf by Darcus Austin, a trained medical scribe. The creation of this record is based on the scribe's personal observations and the provider's statements to them. This document has been checked and approved by the attending provider.

## 2015-08-13 ENCOUNTER — Encounter: Payer: Self-pay | Admitting: Medical Oncology

## 2015-08-13 ENCOUNTER — Ambulatory Visit
Admission: RE | Admit: 2015-08-13 | Discharge: 2015-08-13 | Disposition: A | Discharge status: Home / Self Care | Payer: Medicare Other | Source: Ambulatory Visit | Attending: Radiation Oncology | Admitting: Radiation Oncology

## 2015-08-13 ENCOUNTER — Ambulatory Visit: Payer: Medicare Other | Admitting: Radiation Oncology

## 2015-08-13 ENCOUNTER — Ambulatory Visit: Payer: Medicare Other

## 2015-08-13 DIAGNOSIS — J069 Acute upper respiratory infection, unspecified: Secondary | ICD-10-CM | POA: Diagnosis not present

## 2015-08-13 DIAGNOSIS — C61 Malignant neoplasm of prostate: Secondary | ICD-10-CM | POA: Diagnosis not present

## 2015-08-13 DIAGNOSIS — M1990 Unspecified osteoarthritis, unspecified site: Secondary | ICD-10-CM | POA: Diagnosis not present

## 2015-08-13 DIAGNOSIS — I10 Essential (primary) hypertension: Secondary | ICD-10-CM | POA: Diagnosis not present

## 2015-08-13 DIAGNOSIS — Z510 Encounter for antineoplastic radiation therapy: Secondary | ICD-10-CM | POA: Diagnosis not present

## 2015-08-14 ENCOUNTER — Ambulatory Visit: Payer: Medicare Other

## 2015-08-14 ENCOUNTER — Ambulatory Visit: Payer: Medicare Other | Admitting: Radiation Oncology

## 2015-08-14 ENCOUNTER — Ambulatory Visit
Admission: RE | Admit: 2015-08-14 | Discharge: 2015-08-14 | Disposition: A | Discharge status: Home / Self Care | Payer: Medicare Other | Source: Ambulatory Visit | Attending: Radiation Oncology | Admitting: Radiation Oncology

## 2015-08-14 DIAGNOSIS — I10 Essential (primary) hypertension: Secondary | ICD-10-CM | POA: Diagnosis not present

## 2015-08-14 DIAGNOSIS — Z510 Encounter for antineoplastic radiation therapy: Secondary | ICD-10-CM | POA: Diagnosis not present

## 2015-08-14 DIAGNOSIS — M1990 Unspecified osteoarthritis, unspecified site: Secondary | ICD-10-CM | POA: Diagnosis not present

## 2015-08-14 DIAGNOSIS — C61 Malignant neoplasm of prostate: Secondary | ICD-10-CM | POA: Diagnosis not present

## 2015-08-14 DIAGNOSIS — J069 Acute upper respiratory infection, unspecified: Secondary | ICD-10-CM | POA: Diagnosis not present

## 2015-08-15 ENCOUNTER — Ambulatory Visit: Payer: Medicare Other

## 2015-08-15 ENCOUNTER — Ambulatory Visit: Payer: Medicare Other | Admitting: Radiation Oncology

## 2015-08-15 ENCOUNTER — Ambulatory Visit
Admission: RE | Admit: 2015-08-15 | Discharge: 2015-08-15 | Disposition: A | Discharge status: Home / Self Care | Payer: Medicare Other | Source: Ambulatory Visit | Attending: Radiation Oncology | Admitting: Radiation Oncology

## 2015-08-15 DIAGNOSIS — J069 Acute upper respiratory infection, unspecified: Secondary | ICD-10-CM | POA: Diagnosis not present

## 2015-08-15 DIAGNOSIS — M1990 Unspecified osteoarthritis, unspecified site: Secondary | ICD-10-CM | POA: Diagnosis not present

## 2015-08-15 DIAGNOSIS — C61 Malignant neoplasm of prostate: Secondary | ICD-10-CM | POA: Diagnosis not present

## 2015-08-15 DIAGNOSIS — Z510 Encounter for antineoplastic radiation therapy: Secondary | ICD-10-CM | POA: Diagnosis not present

## 2015-08-15 DIAGNOSIS — I10 Essential (primary) hypertension: Secondary | ICD-10-CM | POA: Diagnosis not present

## 2015-08-16 ENCOUNTER — Ambulatory Visit: Payer: Medicare Other | Admitting: Radiation Oncology

## 2015-08-16 ENCOUNTER — Ambulatory Visit: Payer: Medicare Other

## 2015-08-16 ENCOUNTER — Ambulatory Visit
Admission: RE | Admit: 2015-08-16 | Discharge: 2015-08-16 | Disposition: A | Discharge status: Home / Self Care | Payer: Medicare Other | Source: Ambulatory Visit | Attending: Radiation Oncology | Admitting: Radiation Oncology

## 2015-08-16 DIAGNOSIS — I10 Essential (primary) hypertension: Secondary | ICD-10-CM | POA: Diagnosis not present

## 2015-08-16 DIAGNOSIS — J069 Acute upper respiratory infection, unspecified: Secondary | ICD-10-CM | POA: Diagnosis not present

## 2015-08-16 DIAGNOSIS — C61 Malignant neoplasm of prostate: Secondary | ICD-10-CM | POA: Diagnosis not present

## 2015-08-16 DIAGNOSIS — Z510 Encounter for antineoplastic radiation therapy: Secondary | ICD-10-CM | POA: Diagnosis not present

## 2015-08-16 DIAGNOSIS — M1990 Unspecified osteoarthritis, unspecified site: Secondary | ICD-10-CM | POA: Diagnosis not present

## 2015-08-17 ENCOUNTER — Ambulatory Visit
Admission: RE | Admit: 2015-08-17 | Discharge: 2015-08-17 | Disposition: A | Discharge status: Home / Self Care | Payer: Medicare Other | Source: Ambulatory Visit | Attending: Radiation Oncology | Admitting: Radiation Oncology

## 2015-08-17 ENCOUNTER — Ambulatory Visit: Payer: Medicare Other

## 2015-08-17 VITALS — BP 140/87 | HR 66 | Resp 16 | Wt 189.1 lb

## 2015-08-17 DIAGNOSIS — Z510 Encounter for antineoplastic radiation therapy: Secondary | ICD-10-CM | POA: Diagnosis not present

## 2015-08-17 DIAGNOSIS — J069 Acute upper respiratory infection, unspecified: Secondary | ICD-10-CM | POA: Diagnosis not present

## 2015-08-17 DIAGNOSIS — I10 Essential (primary) hypertension: Secondary | ICD-10-CM | POA: Diagnosis not present

## 2015-08-17 DIAGNOSIS — M1990 Unspecified osteoarthritis, unspecified site: Secondary | ICD-10-CM | POA: Diagnosis not present

## 2015-08-17 DIAGNOSIS — C61 Malignant neoplasm of prostate: Secondary | ICD-10-CM

## 2015-08-17 NOTE — Progress Notes (Signed)
  Radiation Oncology         [phone]   Name: [name] MRN: [id number]   Date: 08/17/2015  DOB: Dec 28, 1945   Weekly Radiation Therapy Management    ICD-9-CM ICD-10-CM   1. Prostate cancer ([place]) 185 C61     Current Dose: 61.2 Gy  Planned Dose:  68.4 Gy  Narrative The patient presents for routine under treatment assessment.  Denies pain, dysuria, hematuria, difficulty emptying his bladder, or diarrhea. Reports nocturia x 2-3, a strong steady urine stream, and mild fatigue.  Set-up films were reviewed. The chart was checked.  Physical Findings  weight is 189 lb 1.6 oz (85.775 kg). His blood pressure is 140/87 and his pulse is 66. His respiration is 16 and oxygen saturation is 100%. . Weight essentially stable.  No significant changes.  Impression The patient is tolerating radiation.  Plan Continue treatment as planned.    Sheral Apley Tammi Klippel, M.D.  This document serves as a record of services personally performed by [name], MD. It was created on his behalf by Darcus Austin, a trained medical scribe. The creation of this record is based on the scribe's personal observations and the provider's statements to them. This document has been checked and approved by the attending provider.

## 2015-08-17 NOTE — Progress Notes (Signed)
Weight and vitals stable. Denies pain. Denies dysuria or hematuria. Reports nocturia x 2-3. Reports a strong steady urine stream. Denies difficulty emptying his bladder. Denies diarrhea. Reports mild fatigue.  BP 140/87 mmHg  Pulse 66  Resp 16  Wt 189 lb 1.6 oz (85.775 kg)  SpO2 100% Wt Readings from Last 3 Encounters:  08/17/15 189 lb 1.6 oz (85.775 kg)  08/10/15 189 lb 1.6 oz (85.775 kg)  08/02/15 188 lb 9.6 oz (85.548 kg)

## 2015-08-20 ENCOUNTER — Ambulatory Visit
Admission: RE | Admit: 2015-08-20 | Discharge: 2015-08-20 | Disposition: A | Discharge status: Home / Self Care | Payer: Medicare Other | Source: Ambulatory Visit | Attending: Radiation Oncology | Admitting: Radiation Oncology

## 2015-08-20 ENCOUNTER — Ambulatory Visit: Payer: Medicare Other

## 2015-08-20 DIAGNOSIS — C61 Malignant neoplasm of prostate: Secondary | ICD-10-CM | POA: Diagnosis not present

## 2015-08-20 DIAGNOSIS — M1990 Unspecified osteoarthritis, unspecified site: Secondary | ICD-10-CM | POA: Diagnosis not present

## 2015-08-20 DIAGNOSIS — I10 Essential (primary) hypertension: Secondary | ICD-10-CM | POA: Diagnosis not present

## 2015-08-20 DIAGNOSIS — J069 Acute upper respiratory infection, unspecified: Secondary | ICD-10-CM | POA: Diagnosis not present

## 2015-08-20 DIAGNOSIS — Z510 Encounter for antineoplastic radiation therapy: Secondary | ICD-10-CM | POA: Diagnosis not present

## 2015-08-21 ENCOUNTER — Ambulatory Visit: Payer: Medicare Other

## 2015-08-21 ENCOUNTER — Ambulatory Visit
Admission: RE | Admit: 2015-08-21 | Discharge: 2015-08-21 | Disposition: A | Discharge status: Home / Self Care | Payer: Medicare Other | Source: Ambulatory Visit | Attending: Radiation Oncology | Admitting: Radiation Oncology

## 2015-08-22 ENCOUNTER — Ambulatory Visit: Payer: Medicare Other

## 2015-08-22 ENCOUNTER — Ambulatory Visit
Admission: RE | Admit: 2015-08-22 | Discharge: 2015-08-22 | Disposition: A | Discharge status: Home / Self Care | Payer: Medicare Other | Source: Ambulatory Visit | Attending: Radiation Oncology | Admitting: Radiation Oncology

## 2015-08-22 DIAGNOSIS — C61 Malignant neoplasm of prostate: Secondary | ICD-10-CM | POA: Diagnosis not present

## 2015-08-22 DIAGNOSIS — M1990 Unspecified osteoarthritis, unspecified site: Secondary | ICD-10-CM | POA: Diagnosis not present

## 2015-08-22 DIAGNOSIS — J069 Acute upper respiratory infection, unspecified: Secondary | ICD-10-CM | POA: Diagnosis not present

## 2015-08-22 DIAGNOSIS — Z510 Encounter for antineoplastic radiation therapy: Secondary | ICD-10-CM | POA: Diagnosis not present

## 2015-08-22 DIAGNOSIS — I10 Essential (primary) hypertension: Secondary | ICD-10-CM | POA: Diagnosis not present

## 2015-08-23 ENCOUNTER — Ambulatory Visit: Payer: Medicare Other

## 2015-08-23 ENCOUNTER — Ambulatory Visit
Admission: RE | Admit: 2015-08-23 | Discharge: 2015-08-23 | Disposition: A | Discharge status: Home / Self Care | Payer: Medicare Other | Source: Ambulatory Visit | Attending: Radiation Oncology | Admitting: Radiation Oncology

## 2015-08-23 ENCOUNTER — Encounter: Payer: Self-pay | Admitting: Radiation Oncology

## 2015-08-23 VITALS — BP 142/71 | HR 58 | Resp 16 | Wt 188.2 lb

## 2015-08-23 DIAGNOSIS — C61 Malignant neoplasm of prostate: Secondary | ICD-10-CM | POA: Diagnosis not present

## 2015-08-23 DIAGNOSIS — M1990 Unspecified osteoarthritis, unspecified site: Secondary | ICD-10-CM | POA: Diagnosis not present

## 2015-08-23 DIAGNOSIS — Z510 Encounter for antineoplastic radiation therapy: Secondary | ICD-10-CM | POA: Diagnosis not present

## 2015-08-23 DIAGNOSIS — I10 Essential (primary) hypertension: Secondary | ICD-10-CM | POA: Diagnosis not present

## 2015-08-23 DIAGNOSIS — J069 Acute upper respiratory infection, unspecified: Secondary | ICD-10-CM | POA: Diagnosis not present

## 2015-08-23 NOTE — Progress Notes (Signed)
  Radiation Oncology         [id number]   Name: [name] MRN: [id number]   Date: 08/23/2015  DOB: Jan 09, 1946   Weekly Radiation Therapy Management    ICD-9-CM ICD-10-CM   1. Prostate cancer ([place]) 185 C61     Current Dose: 66.6 Gy  Planned Dose:  68.4 Gy  Narrative The patient presents for routine under treatment assessment.  Denies pain, dysuria, hematuria, difficulty emptying his bladder, or diarrhea. Reports nocturia x 2-3, a strong steady urine stream, and mild fatigue.  Set-up films were reviewed. The chart was checked.  Physical Findings  weight is 188 lb 3.2 oz (85.367 kg). His blood pressure is 142/71 and his pulse is 58. His respiration is 16 and oxygen saturation is 100%. . Weight essentially stable.  No significant changes. In general this is a well appearing caucasian male in no acute distress. He's alert and oriented x4 and appropriate throughout the examination. Cardiopulmonary assessment is negative for acute distress and he exhibits normal effort.    Impression The patient is tolerating radiation.  Plan Continue treatment as planned. Patient will see Dr. Alinda Money 08/31/2015, and Dr. Alen Blew 09/05/2015    Sheral Apley. Tammi Klippel, M.D.    This document serves as a record of services personally performed by [name], MD. It was created on his behalf by Truddie Hidden, a trained medical scribe. The creation of this record is based on the scribe's personal observations and the provider's statements to them. This document has been checked and approved by the attending provider.

## 2015-08-23 NOTE — Progress Notes (Addendum)
Weight and vitals stable. Denies pain. Denies dysuria or hematuria. Reports nocturia x 2-3. Reports a strong steady urine stream. Denies difficulty emptying his bladder. Denies diarrhea. Reports mild fatigue. One month follow up appointment card given. Patient completes radiation treatment tomorrow. Patient understands to contact this RN for future needs. Scheduled to see [facility] on 7/7 and Shad[facility] on 7/12.  BP 142/71 mmHg  Pulse 58  Resp 16  Wt 188 lb 3.2 oz (85.367 kg)  SpO2 100% Wt Readings from Last 3 Encounters:  08/23/15 188 lb 3.2 oz (85.367 kg)  08/17/15 189 lb 1.6 oz (85.775 kg)  08/10/15 189 lb 1.6 oz (85.775 kg)

## 2015-08-24 ENCOUNTER — Encounter: Payer: Self-pay | Admitting: Radiation Oncology

## 2015-08-24 ENCOUNTER — Ambulatory Visit: Payer: Medicare Other

## 2015-08-24 ENCOUNTER — Ambulatory Visit
Admission: RE | Admit: 2015-08-24 | Discharge: 2015-08-24 | Disposition: A | Discharge status: Home / Self Care | Payer: Medicare Other | Source: Ambulatory Visit | Attending: Radiation Oncology | Admitting: Radiation Oncology

## 2015-08-24 DIAGNOSIS — Z510 Encounter for antineoplastic radiation therapy: Secondary | ICD-10-CM | POA: Diagnosis not present

## 2015-08-24 DIAGNOSIS — C61 Malignant neoplasm of prostate: Secondary | ICD-10-CM | POA: Diagnosis not present

## 2015-08-24 DIAGNOSIS — M1990 Unspecified osteoarthritis, unspecified site: Secondary | ICD-10-CM | POA: Diagnosis not present

## 2015-08-24 DIAGNOSIS — J069 Acute upper respiratory infection, unspecified: Secondary | ICD-10-CM | POA: Diagnosis not present

## 2015-08-24 DIAGNOSIS — I10 Essential (primary) hypertension: Secondary | ICD-10-CM | POA: Diagnosis not present

## 2015-08-25 NOTE — Progress Notes (Signed)
  Radiation Oncology         [phone] ________________________________  Name: [name] MRN: [id number]  Date: 08/24/2015  DOB: 1945/06/27  End of Treatment Note  Diagnosis:   70 y.o. gentleman with a detectable rising PSA of 0.3 status post prostatectomy with 1 node positive. He started ADT and now has a rising PSA.     Indication for treatment:  Curative, Definitive Radiotherapy       Radiation treatment dates:   5/8-6/30/17  Site/dose:  1. The prostate, seminal vesicles, and pelvic lymph nodes were initially treated to 45 Gy in 25 fractions of 1.8 Gy  2. The prostatic fossa only was boosted to 68.4 Gy with 13 additional fractions of 1.8 Gy   Beams/energy:  1. The prostate, seminal vesicles, and pelvic lymph nodes were initially treated using VMAT intensity modulated radiotherapy delivering 6 megavolt photons. Image guidance was performed with CB-CT studies prior to each fraction. He was immobilized with a body fix lower extremity mold.  2. the prostate only was boosted using VMAT intensity modulated radiotherapy delivering 6 megavolt photons. Image guidance was performed with CB-CT studies prior to each fraction. He was immobilized with a body fix lower extremity mold.  Narrative: The patient tolerated radiation treatment relatively well.   The patient experienced some minor urinary irritation and modest fatigue.    Plan: The patient has completed radiation treatment. He will return to radiation oncology clinic for routine followup in one month. I advised him to call or return sooner if he has any questions or concerns related to his recovery or treatment. ________________________________  Sheral Apley. Tammi Klippel, M.D.

## 2015-08-27 ENCOUNTER — Ambulatory Visit: Payer: Medicare Other

## 2015-08-27 ENCOUNTER — Encounter: Payer: Self-pay | Admitting: Medical Oncology

## 2015-08-31 ENCOUNTER — Ambulatory Visit: Payer: Medicare Other

## 2015-08-31 DIAGNOSIS — C61 Malignant neoplasm of prostate: Secondary | ICD-10-CM | POA: Diagnosis not present

## 2015-08-31 DIAGNOSIS — E291 Testicular hypofunction: Secondary | ICD-10-CM | POA: Diagnosis not present

## 2015-09-05 ENCOUNTER — Ambulatory Visit (HOSPITAL_BASED_OUTPATIENT_CLINIC_OR_DEPARTMENT_OTHER): Payer: Medicare Other | Admitting: Oncology

## 2015-09-05 ENCOUNTER — Telehealth: Payer: Self-pay | Admitting: Oncology

## 2015-09-05 VITALS — BP 128/61 | HR 59 | Temp 98.4°F | Resp 18 | Ht 71.0 in | Wt 185.2 lb

## 2015-09-05 DIAGNOSIS — E291 Testicular hypofunction: Secondary | ICD-10-CM | POA: Diagnosis not present

## 2015-09-05 DIAGNOSIS — C61 Malignant neoplasm of prostate: Secondary | ICD-10-CM

## 2015-09-05 DIAGNOSIS — C779 Secondary and unspecified malignant neoplasm of lymph node, unspecified: Secondary | ICD-10-CM | POA: Diagnosis not present

## 2015-09-05 NOTE — Telephone Encounter (Signed)
GAVE PATIENT AVS REPORT AND APPOINTMENTS FOR November.  °

## 2015-09-05 NOTE — Progress Notes (Signed)
Hematology and Oncology Follow Up Visit  [name] [id number] 1945/05/09 70 y.o. 09/05/2015 10:34 AM    Principle Diagnosis: 69 year old gentleman diagnosed with prostate cancer in November of 2012 presented with a PSA of 4.38 and found to have a Gleason score 4+3 equals 7, his clinical staging was T2c N1. 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    Prior Therapy: He presented with a PSA of 4.38 and subsequently underwent a radical prostatectomy and lymph node dissection on November of 2012. 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  He did not have any evidence of angiolymphatic invasion or extraprostatic extension. The margins were negative. He had one out of 12 lymph nodes involved with cancer predominantly in the right pelvic wall.  His post operative PSA remained at 0.65 and subsequently to 0.89. And in March of 2013 he was started on Lupron and have been on it since that time. His PSA remains very low 0.01.  His PSA went up to 0.3 in March 2017. He completed salvage radiation therapy completed in June 2017 under the care of Dr. Tammi [name]. He received planned dose of 68.4 grays.  Current therapy: Lupron given every 6 months under the care of Dr. Alinda [name].  Interim History:  [name] presents today for a follow-up visit. Since the last visit, he completed radiation therapy without any major complications. He denied any hematuria or dysuria. He does report some occasional incontinence. He is able to perform activities of daily living without any decline. He denied any bone pain or pathological fractures. His repeat PSA was 0.1 after radiation therapy.  He does not report any headaches, blurry vision, syncope or seizures. He has not reported any weight loss or appetite changes. Did not report any constitutional symptoms. Does not report any abdominal pain or satiety. Did not  report any back pain shoulder pain or any bone discomfort. Has not reported any hematochezia or melena. He does not report any headaches or blurry vision or syncope. He does not report any lymphadenopathy or petechiae. The remaining review of systems is unremarkable.   Medications: I have reviewed the patient's current medications.  Current Outpatient Prescriptions  Medication Sig Dispense Refill  . ascorbic acid (VITAMIN C) 1000 MG tablet Take 1,000 mg by mouth 3 (three) times a week.    Marland Kitchen aspirin EC 81 MG tablet Take 81 mg by mouth daily.    . benazepril-hydrochlorthiazide (LOTENSIN HCT) 20-12.5 MG per tablet Take 1 tablet by mouth every morning.      . Calcium Carbonate-Vitamin D (CALCIUM 600+D3 PO) Take 1 tablet by mouth daily. Patient takes 1-2 tablets daily    . diphenhydrAMINE (BENADRYL) 25 MG tablet Take 25 mg by mouth every 8 (eight) hours as needed. For allergies    . Eszopiclone (ESZOPICLONE) 3 MG TABS Take 3 mg by mouth at bedtime. Take immediately before bedtime    . MULTIPLE VITAMIN PO Take 1 tablet by mouth daily.    . simvastatin (ZOCOR) 20 MG tablet Take 20 mg by mouth at bedtime.      No current facility-administered medications for this visit.     Allergies: No Known Allergies  Past Medical History, Surgical history, Social history, and Family History were reviewed and updated.  Physical Exam: Blood pressure 128/61, pulse 59, temperature 98.4 F (36.9 C), temperature source Oral, resp. rate 18, height 5\' 11"  (1.803 m), weight 185 lb 3.2 oz (84.006 kg), SpO2 99 %. ECOG:  0 General appearance: Pleasant-appearing gentleman without distress.. Head: Normocephalic, without obvious abnormality no oral ulcers or lesions. Neck: no adenopathy Lymph nodes: Cervical, supraclavicular, and axillary nodes normal. Heart:regular rate and rhythm, S1, S2 normal, no murmur, click, rub or gallop Lung:chest clear, no wheezing, rales, normal symmetric air entry no dullness to  percussion. Abdomin: soft, non-tender, without masses or organomegaly no shifting dullness or ascites. EXT:no erythema, induration, or nodules   Lab Results: Lab Results  Component Value Date   WBC 7.0 05/09/2015   HGB 14.2 05/09/2015   HCT 43.1 05/09/2015   MCV 88.8 05/09/2015   PLT 183 05/09/2015     Chemistry      Component Value Date/Time   NA 141 05/09/2015 0830   NA 133* 01/14/2011 0517   K 4.2 05/09/2015 0830   K 4.1 01/14/2011 0517   CL 101 01/14/2011 0517   CO2 29 05/09/2015 0830   CO2 26 01/14/2011 0517   BUN 16.9 05/09/2015 0830   BUN 15 01/14/2011 0517   CREATININE 1.0 05/09/2015 0830   CREATININE 0.86 01/14/2011 0517      Component Value Date/Time   CALCIUM 10.1 05/09/2015 0830   CALCIUM 8.6 01/14/2011 0517   ALKPHOS 48 05/09/2015 0830   AST 27 05/09/2015 0830   ALT 19 05/09/2015 0830   BILITOT 0.59 05/09/2015 0830     Results for [name] (MRN [id number]) as of 05/11/2015 08:38  Ref. Range 05/09/2014 08:41 05/09/2015 08:30  PSA Latest Ref Range: <=4.00 ng/mL 0.08 0.29   PSA in 08/2015 0.1.     Impression and Plan:  70 year old gentleman with the following issues:  1. Prostate cancer diagnosed in November of 2012 presented with a PSA of 4.38 and found to have a Gleason score 4+3 equals 7, his clinical staging was T2c N1. He is status post robotic prostatectomy and lymphadenectomy with one of 12 lymph nodes were involved. He continued to have elevated PSA that is detectable around 0.68 and 0.84 and had been on Lupron since March of 2013.  PSA on 05/09/2015 was up to 0.29. His CT scan chest abdomen and pelvis on 05/09/2015 showed no evidence of measurable disease.  He is status post radiation therapy for salvage purposes and a follow-up PSA was 0.1 in July 2017. I recommended continued androgen deprivation only and 6 Hormone therapy if his PSA starts to rise again. His next PSA scheduled for November 2017 and I will see him in consultation  afterwards.  2. Androgen depravation: He is to continue Lupron with Dr. Alinda [name] has prescribed.  3. Bone health: I agree with calcium and vitamin D and repeat bone density frequently.  [facility], MD 7/12/201710:34 AM

## 2015-09-27 ENCOUNTER — Ambulatory Visit: Payer: Self-pay | Admitting: Radiation Oncology

## 2015-10-04 ENCOUNTER — Encounter: Payer: Self-pay | Admitting: Radiation Oncology

## 2015-10-04 ENCOUNTER — Ambulatory Visit
Admission: RE | Admit: 2015-10-04 | Discharge: 2015-10-04 | Disposition: A | Discharge status: Home / Self Care | Payer: Medicare Other | Source: Ambulatory Visit | Attending: Radiation Oncology | Admitting: Radiation Oncology

## 2015-10-04 DIAGNOSIS — C61 Malignant neoplasm of prostate: Secondary | ICD-10-CM | POA: Insufficient documentation

## 2015-10-04 DIAGNOSIS — R9721 Rising PSA following treatment for malignant neoplasm of prostate: Secondary | ICD-10-CM | POA: Insufficient documentation

## 2015-10-04 DIAGNOSIS — Z9079 Acquired absence of other genital organ(s): Secondary | ICD-10-CM | POA: Diagnosis not present

## 2015-10-04 MED ORDER — GABAPENTIN 300 MG PO CAPS: ORAL_CAPSULE | ORAL | 1 refills | Status: DC

## 2015-10-04 MED ORDER — VALACYCLOVIR HCL 1 G PO TABS: 1000.0000 mg | ORAL_TABLET | Freq: Three times a day (TID) | ORAL | 0 refills | Status: DC

## 2015-10-04 MED ORDER — VALACYCLOVIR HCL 1 G PO TABS: 1000.0000 mg | ORAL_TABLET | Freq: Three times a day (TID) | ORAL | 0 refills | Status: AC

## 2015-10-04 NOTE — Progress Notes (Signed)
Mr. Mcwilliams returns today s/p XRT to his prostate.  He denies any staining to void, with some leakage during the day and sometimes at night, but feels this is the same prior to starting XRT. Nocturia x 2-3.  No dysuria. Denies any rectal irritation, loose stools, nor diarrhea. No fatigue. Last PSA was 0.1 as of 08/23/15.  Last Androgen injection on July 7th.  BP 129/63 (BP Location: Left Arm, Patient Position: Sitting, Cuff Size: Normal)   Temp 98.2 F (36.8 C)   Ht 5\' 11"  (1.803 m)   Wt 186 lb 6.4 oz (84.6 kg)   BMI 26.00 kg/m    Wt Readings from Last 3 Encounters:  10/04/15 186 lb 6.4 oz (84.6 kg)  09/05/15 185 lb 3.2 oz (84 kg)  08/23/15 188 lb 3.2 oz (85.4 kg)

## 2015-10-05 ENCOUNTER — Telehealth: Payer: Self-pay | Admitting: Radiation Oncology

## 2015-10-05 NOTE — Telephone Encounter (Signed)
Pt called for clarification regarding his gabapentin. We discussed that we would recommend that he use this at his discretion underwent*due to potential pain from shingles. He states that he's going to hold off on at this point but is appreciative of having this as he is going to be going out of town and out of the country in the next couple of weeks.

## 2015-10-05 NOTE — Progress Notes (Signed)
Radiation Oncology         [phone] ________________________________  Name: [name] MRN: [id number]  Date: 10/04/2015  DOB: 02-28-1945  Follow-Up Visit Note  CC: Gara Kroner, MD  Wyatt Portela, MD  Diagnosis:  70 y.o. gentleman with a detectable rising PSA of 0.3 status post prostatectomy with 1 node positive. With a rising PSA despite androgen deprivation.   Interval Since Last Radiation: 6 weeks  5/8-6/30/17  Site/dose:  1. The prostate, seminal vesicles, and pelvic lymph nodes were initially treated to 45 Gy in 25 fractions of 1.8 Gy  2. The prostatic fossa only was boosted to 68.4 Gy with 13 additional fractions of 1.8 Gy   Narrative:  The patient returns today for routine follow-up.the patient is quite excited because of the end of June he had a PSA drawn that was 0.1. He tolerated radiotherapy very well, and his urinary symptoms have resolved. He is concerned about an area along his left flank. He has had burning sensation near for several days prior to see a few vesicles and the last week. He denies seeing any fluid within these vesicles. He denies any shortness of breath chest pain, fevers or chills. No other complaints or verbalized.                            ALLERGIES:  has No Known Allergies.  Meds: Current Outpatient Prescriptions  Medication Sig Dispense Refill  . ascorbic acid (VITAMIN C) 1000 MG tablet Take 1,000 mg by mouth 3 (three) times a week.    Marland Kitchen aspirin EC 81 MG tablet Take 81 mg by mouth daily.    . benazepril-hydrochlorthiazide (LOTENSIN HCT) 20-12.5 MG per tablet Take 1 tablet by mouth every morning.      . Calcium Carbonate-Vitamin D (CALCIUM 600+D3 PO) Take 1 tablet by mouth daily. Patient takes 1-2 tablets daily    . diphenhydrAMINE (BENADRYL) 25 MG tablet Take 25 mg by mouth every 8 (eight) hours as needed. For allergies    . MULTIPLE VITAMIN PO Take 1 tablet by mouth daily.    . simvastatin (ZOCOR) 20 MG tablet Take 20 mg by mouth at  bedtime.     Marland Kitchen zolpidem (AMBIEN CR) 6.25 MG CR tablet Take 6.25 mg by mouth at bedtime as needed for sleep.    Marland Kitchen gabapentin (NEURONTIN) 300 MG capsule Take 1 pill day 1, 1 pill twice daily on day 2, and 1 pill three times daily on day 3 as needed for postherpetic neuralgia 93 capsule 1  . valACYclovir (VALTREX) 1000 MG tablet Take 1 tablet (1,000 mg total) by mouth 3 (three) times daily. 21 tablet 0   No current facility-administered medications for this encounter.     Physical Findings:  height is 5\' 11"  (1.803 m) and weight is 186 lb 6.4 oz (84.6 kg). His temperature is 98.2 F (36.8 C). His blood pressure is 129/63.  In general this is a well appearing Caucasian male in no acute distress. He's alert and oriented x4 and appropriate throughout the examination. Cardiopulmonary assessment is negative for acute distress and he exhibits normal effort. His left flank is evaluated and anteriorly, there are approximately 4 vesicles that have since erupted  And have crusted. Posteriorly there are approximately 7-8 similar-appearing lesions. These appear to be similar and are both concerning for shingles.   Lab Findings: Lab Results  Component Value Date   WBC 7.0 05/09/2015   HGB  14.2 05/09/2015   HCT 43.1 05/09/2015   MCV 88.8 05/09/2015   PLT 183 05/09/2015     Radiographic Findings: No results found.  Impression/Plan: 1. Rising post prostatectomy PSA despite androgen deprivation. The patient appears to be healed well from radiotherapy,he continues Lupron with Dr. Alinda Money at follows up with Dr. Alen Blew. His most recent PSA was 0.1. He is of course thrilled with this and we will continue to follow this expectantly. We will be happy to see him back as needed moving forward, and he will continue his PSA checks under Dr. Alinda Money in his next assessment will be in October. 2. Herpes zoster. I discussed the patient and the findings and my concerns that clinically appears that he has shingles.:  Prescription for valacyclovir 1 g 3 times a day for 7 days. I also provided a prescription for gabapentin as the patient is going to be out of the country next 2 weeks, that he can treat any symptoms of postherpetic neuralgia is a developed. He states agreement and understanding we'll call if he has questions.    Carola Rhine, PAC

## 2015-10-12 ENCOUNTER — Telehealth: Payer: Self-pay | Admitting: Radiation Oncology

## 2015-10-12 DIAGNOSIS — B029 Zoster without complications: Secondary | ICD-10-CM | POA: Diagnosis not present

## 2015-10-12 NOTE — Telephone Encounter (Addendum)
The patient called to let me know that his vesicles were improving with regard to his shingles, he has finished his Valacyclovir and noticed a few more blisters earlier in the week, but feels that these are also improved. He will start Gabapentin for the discomfort and follow up sooner if he has any progressive symptoms prior to going out of town.

## 2015-12-25 DIAGNOSIS — G4700 Insomnia, unspecified: Secondary | ICD-10-CM | POA: Diagnosis not present

## 2015-12-25 DIAGNOSIS — Z1211 Encounter for screening for malignant neoplasm of colon: Secondary | ICD-10-CM | POA: Diagnosis not present

## 2015-12-25 DIAGNOSIS — E785 Hyperlipidemia, unspecified: Secondary | ICD-10-CM | POA: Diagnosis not present

## 2015-12-25 DIAGNOSIS — Z0000 Encounter for general adult medical examination without abnormal findings: Secondary | ICD-10-CM | POA: Diagnosis not present

## 2015-12-25 DIAGNOSIS — I10 Essential (primary) hypertension: Secondary | ICD-10-CM | POA: Diagnosis not present

## 2015-12-25 DIAGNOSIS — C61 Malignant neoplasm of prostate: Secondary | ICD-10-CM | POA: Diagnosis not present

## 2015-12-25 DIAGNOSIS — G250 Essential tremor: Secondary | ICD-10-CM | POA: Diagnosis not present

## 2015-12-25 DIAGNOSIS — D696 Thrombocytopenia, unspecified: Secondary | ICD-10-CM | POA: Diagnosis not present

## 2015-12-25 DIAGNOSIS — Z1389 Encounter for screening for other disorder: Secondary | ICD-10-CM | POA: Diagnosis not present

## 2015-12-25 DIAGNOSIS — Z23 Encounter for immunization: Secondary | ICD-10-CM | POA: Diagnosis not present

## 2015-12-26 DIAGNOSIS — C61 Malignant neoplasm of prostate: Secondary | ICD-10-CM | POA: Diagnosis not present

## 2016-01-02 DIAGNOSIS — C61 Malignant neoplasm of prostate: Secondary | ICD-10-CM | POA: Diagnosis not present

## 2016-01-03 DIAGNOSIS — Z1211 Encounter for screening for malignant neoplasm of colon: Secondary | ICD-10-CM | POA: Diagnosis not present

## 2016-01-07 ENCOUNTER — Ambulatory Visit
Admission: RE | Admit: 2016-01-07 | Discharge: 2016-01-07 | Disposition: A | Discharge status: Home / Self Care | Payer: Medicare Other | Source: Ambulatory Visit | Attending: Family Medicine | Admitting: Family Medicine

## 2016-01-07 ENCOUNTER — Other Ambulatory Visit: Payer: Self-pay | Admitting: Family Medicine

## 2016-01-07 DIAGNOSIS — D696 Thrombocytopenia, unspecified: Secondary | ICD-10-CM | POA: Diagnosis not present

## 2016-01-07 DIAGNOSIS — M25552 Pain in left hip: Secondary | ICD-10-CM

## 2016-01-07 DIAGNOSIS — D649 Anemia, unspecified: Secondary | ICD-10-CM | POA: Diagnosis not present

## 2016-01-08 ENCOUNTER — Ambulatory Visit (HOSPITAL_BASED_OUTPATIENT_CLINIC_OR_DEPARTMENT_OTHER): Payer: Medicare Other | Admitting: Oncology

## 2016-01-08 ENCOUNTER — Telehealth: Payer: Self-pay | Admitting: Oncology

## 2016-01-08 VITALS — BP 159/73 | HR 59 | Temp 97.8°F | Resp 17 | Wt 182.7 lb

## 2016-01-08 DIAGNOSIS — C61 Malignant neoplasm of prostate: Secondary | ICD-10-CM

## 2016-01-08 NOTE — Progress Notes (Signed)
Hematology and Oncology Follow Up Visit  [name] [id number] October 13, 1945 70 y.o. 01/08/2016 9:32 AM    Principle Diagnosis: 70 year old gentleman diagnosed with prostate cancer in November of 2012 presented with a PSA of 4.38 and found to have a Gleason score 4+3 equals 7, his clinical staging was T2c N1. 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    Prior Therapy: He presented with a PSA of 4.38 and subsequently underwent a radical prostatectomy and lymph node dissection on November of 2012. 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  He did not have any evidence of angiolymphatic invasion or extraprostatic extension. The margins were negative. He had one out of 12 lymph nodes involved with cancer predominantly in the right pelvic wall.  His post operative PSA remained at 0.65 and subsequently to 0.89. And in March of 2013 he was started on Lupron and have been on it since that time. His PSA remains very low 0.01.  His PSA went up to 0.3 in March 2017. He completed salvage radiation therapy completed in June 2017 under the care of Dr. Tammi Klippel. He received planned dose of 68.4 grays.  Current therapy: Lupron given every 6 months under the care of Dr. Alinda Money.  Interim History:  [name] today for a follow-up visit. Since the last visit, he reports no major complaints. He does report hip pain which is chronic in nature and related to arthritis. He denied any delayed complications related to radiation therapy. He denied any hematuria or dysuria. He does report some occasional incontinence. He is able to perform activities of daily living without any decline. He denied any bone pain or pathological fractures.    He does not report any headaches, blurry vision, syncope or seizures. He has not reported any weight loss or appetite changes. Did not report any constitutional  symptoms. Does not report any abdominal pain or satiety. Did not report any back pain shoulder pain or any bone discomfort. Has not reported any hematochezia or melena. He does not report any headaches or blurry vision or syncope. He does not report any lymphadenopathy or petechiae. The remaining review of systems is unremarkable.   Medications: I have reviewed the patient's current medications.  Current Outpatient Prescriptions  Medication Sig Dispense Refill  . ascorbic acid (VITAMIN C) 1000 MG tablet Take 1,000 mg by mouth 3 (three) times a week.    Marland Kitchen aspirin EC 81 MG tablet Take 81 mg by mouth daily.    . benazepril-hydrochlorthiazide (LOTENSIN HCT) 20-12.5 MG per tablet Take 1 tablet by mouth every morning.      . Calcium Carbonate-Vitamin D (CALCIUM 600+D3 PO) Take 1 tablet by mouth daily. Patient takes 1-2 tablets daily    . diphenhydrAMINE (BENADRYL) 25 MG tablet Take 25 mg by mouth every 8 (eight) hours as needed. For allergies    . gabapentin (NEURONTIN) 300 MG capsule Take 1 pill day 1, 1 pill twice daily on day 2, and 1 pill three times daily on day 3 as needed for postherpetic neuralgia 93 capsule 1  . MULTIPLE VITAMIN PO Take 1 tablet by mouth daily.    . naproxen (NAPROSYN) 500 MG tablet Take 1 tablet by mouth 2 (two) times daily.    . simvastatin (ZOCOR) 20 MG tablet Take 20 mg by mouth at bedtime.     Marland Kitchen zolpidem (AMBIEN CR) 6.25 MG CR tablet Take 6.25 mg by mouth at bedtime as needed for  sleep.     No current facility-administered medications for this visit.      Allergies: No Known Allergies  Past Medical History, Surgical history, Social history, and Family History were reviewed and updated.  Physical Exam: Blood pressure (!) 159/73, pulse (!) 59, temperature 97.8 F (36.6 C), temperature source Oral, resp. rate 17, weight 182 lb 11.2 oz (82.9 kg), SpO2 100 %. ECOG: 0 General appearance: Well-appearing gentleman appeared without distress. Head: Normocephalic, without  obvious abnormality no oral thrush noted. Neck: no adenopathy Lymph nodes: Cervical, supraclavicular, and axillary nodes normal. Heart:regular rate and rhythm, S1, S2 normal, no murmur, click, rub or gallop Lung:chest clear, no wheezing, rales, normal symmetric air entry no dullness to percussion. Abdomin: soft, non-tender, without masses or organomegaly no rebound or guarding. EXT:no erythema, induration, or nodules   Lab Results: Lab Results  Component Value Date   WBC 7.0 05/09/2015   HGB 14.2 05/09/2015   HCT 43.1 05/09/2015   MCV 88.8 05/09/2015   PLT 183 05/09/2015     Chemistry      Component Value Date/Time   NA 141 05/09/2015 0830   K 4.2 05/09/2015 0830   CL 101 01/14/2011 0517   CO2 29 05/09/2015 0830   BUN 16.9 05/09/2015 0830   CREATININE 1.0 05/09/2015 0830      Component Value Date/Time   CALCIUM 10.1 05/09/2015 0830   ALKPHOS 48 05/09/2015 0830   AST 27 05/09/2015 0830   ALT 19 05/09/2015 0830   BILITOT 0.59 05/09/2015 0830     Results for [name] (MRN [id number]) as of 05/11/2015 08:38  Ref. Range 05/09/2014 08:41 05/09/2015 08:30  PSA Latest Ref Range: <=4.00 ng/mL 0.08 0.29   PSA in November 2017 was 0.02.    Impression and Plan:  70 year old gentleman with the following issues:  1. Prostate cancer diagnosed in November of 2012 presented with a PSA of 4.38 and found to have a Gleason score 4+3 equals 7, his clinical staging was T2c N1. He is status post robotic prostatectomy and lymphadenectomy with one of 12 lymph nodes were involved. He continued to have elevated PSA that is detectable around 0.68 and 0.84 and had been on Lupron since March of 2013.  PSA on 05/09/2015 was up to 0.29. His CT scan chest abdomen and pelvis on 05/09/2015 showed no evidence of measurable disease.  He is status post radiation therapy for salvage purposes and a follow-up PSA was 0.1 in July 2017. Repeat PSA in November 2017 continues to decline and 0.02.  Risks  and benefits of continuing androgen deprivation was discussed today. I think it is reasonable to withhold androgen deprivation for the time being and monitor his PSA. If his PSA starts to rise again, restarting androgen deprivation will be warranted. I see very little downside to this strategy especially in the setting of a strict monitoring which I do recommend. He is agreeable to withhold androgen deprivation for the time being.  I recommend PSA monitoring every 3 months and I will repeat his CT scan in March 2018.    2. Androgen depravation: He is to continue Lupron with Dr. Alinda Money has prescribed.  3. Bone health: I agree with calcium and vitamin D and repeat bone density frequently.  4. Follow-up: Will be in 4 months after repeating CT scan.  [facility], MD 11/14/20179:32 AM

## 2016-01-08 NOTE — Telephone Encounter (Signed)
Gave patient avs report and appointments for March. No lab order/no lab requested. Central radiology will contact patient re scan once order entered.

## 2016-01-10 DIAGNOSIS — M545 Low back pain: Secondary | ICD-10-CM | POA: Diagnosis not present

## 2016-01-24 ENCOUNTER — Ambulatory Visit (INDEPENDENT_AMBULATORY_CARE_PROVIDER_SITE_OTHER): Payer: Medicare Other | Admitting: Neurology

## 2016-01-24 ENCOUNTER — Encounter: Payer: Self-pay | Admitting: Neurology

## 2016-01-24 DIAGNOSIS — G250 Essential tremor: Secondary | ICD-10-CM | POA: Diagnosis not present

## 2016-01-24 MED ORDER — PRIMIDONE 50 MG PO TABS: 100.0000 mg | ORAL_TABLET | Freq: Every day | ORAL | 11 refills | Status: DC

## 2016-01-24 MED ORDER — PRIMIDONE 50 MG PO TABS: 100.0000 mg | ORAL_TABLET | Freq: Four times a day (QID) | ORAL | 11 refills | Status: DC

## 2016-01-24 NOTE — Progress Notes (Signed)
PATIENT: [name] DOB: [birth year]  Chief Complaint  Patient presents with  . Tremors    Reports tremors have been a problem for him the last twenty years.  The tremors in his hands are causing him to have increased difficulty with writing, typing and gripping small objects.  He also has occasional tremors in his head.  Marland Kitchen PCP    Antony Contras, MD     HISTORICAL  [name] is a 70 years old male, seen in refer by his primary care physician Dr. Antony Contras for evaluation of tremor.  Initial evaluation was on January 24 2016  I reviewed and summarized the referring note, he had a history of hyperlipidemia, hypertension, prostate cancer, thrombocytopenia.  He retired from Programme researcher, broadcasting/film/video, he noticed problem since age 69s, he could not type fast, it is gradually getting difficulty, noticed hand tremor holding a small cup.  He has difficulty signing his name.  He has no gait abnormality, no loss sense smell. No REM sleep disorder, has chronic insomnia, take Ambien prn for sleep. He denies head titubation, no voice tremor   He has no family history of tremor.  Laboratory evaluation in November 2017, normal TSH, cholesterol 139, LDL 67, hemoglobin 12 point 8, otherwise normal CBC, CMP, creatinine of 0.86, normal free T4,  He drinks a glass of wine or beer which does help his tremor some.  I personally reviewed MRI of the brain without contrast in 2004, that was normal  REVIEW OF SYSTEMS: Full 14 system review of systems performed and notable only for incontinence, impotence, joint pain, tremor, insomnia  ALLERGIES: No Known Allergies  HOME MEDICATIONS: Current Outpatient Prescriptions  Medication Sig Dispense Refill  . ascorbic acid (VITAMIN C) 1000 MG tablet Take 1,000 mg by mouth 3 (three) times a week.    Marland Kitchen aspirin EC 81 MG tablet Take 81 mg by mouth daily.    . benazepril-hydrochlorthiazide (LOTENSIN HCT) 20-12.5 MG per tablet Take 1 tablet by mouth every morning.       . Calcium Carbonate-Vitamin D (CALCIUM 600+D3 PO) Take 1 tablet by mouth daily. Patient takes 1-2 tablets daily    . diphenhydrAMINE (BENADRYL) 25 MG tablet Take 25 mg by mouth every 8 (eight) hours as needed. For allergies    . MULTIPLE VITAMIN PO Take 1 tablet by mouth daily.    . naproxen (NAPROSYN) 500 MG tablet Take 1 tablet by mouth 2 (two) times daily.    . simvastatin (ZOCOR) 20 MG tablet Take 20 mg by mouth at bedtime.     Marland Kitchen zolpidem (AMBIEN CR) 6.25 MG CR tablet Take 6.25 mg by mouth at bedtime as needed for sleep.     No current facility-administered medications for this visit.     PAST MEDICAL HISTORY: Past Medical History:  Diagnosis Date  . Arthritis    hands   . Cancer [facility])    prostate cancer   . Hypertension   . Prostate cancer ([place])   . Recurrent upper respiratory infection (URI)    pt had a cold recent- week ago - better now   . Tremor     PAST SURGICAL HISTORY: Past Surgical History:  Procedure Laterality Date  . CATARACT EXTRACTION Bilateral   . OTHER SURGICAL HISTORY  2005   right hand middle finger surgery   . ROBOT ASSISTED LAPAROSCOPIC RADICAL PROSTATECTOMY  01/13/2011   Procedure: ROBOTIC ASSISTED LAPAROSCOPIC RADICAL PROSTATECTOMY LEVEL 2;  Surgeon: Dutch Gray, MD;  Location: [facility];  Service: Urology;  Laterality: Bilateral;  Robotic Assisted Laparoscopic Prostatetectomy with Bilateral Lymphadenectomy  Level 2    FAMILY HISTORY: Family History  Problem Relation Age of Onset  . Heart attack Father   . Hypertension Father   . Cancer Mother     cervical with mets to lung  . Hypertension Sister     SOCIAL HISTORY:  Social History   Social History  . Marital status: Married    Spouse name: N/A  . Number of children: 2  . Years of education: Bachelors   Occupational History  . Retired    Social History Main Topics  . Smoking status: Never Smoker  . Smokeless tobacco: Never Used  . Alcohol use Yes     Comment: 1 drink per day or  less  . Drug use: No  . Sexual activity: No   Other Topics Concern  . Not on file   Social History Narrative   Lives at home with his wife.   Right-handed.   No caffeine use.     PHYSICAL EXAM   Vitals:   01/24/16 0931  BP: 104/67  Pulse: 64  Weight: 179 lb 8 oz (81.4 kg)  Height: 5\' 11"  (1.803 m)    Not recorded      Body mass index is 25.04 kg/m.  PHYSICAL EXAMNIATION:  Gen: NAD, conversant, well nourised, obese, well groomed                     Cardiovascular: Regular rate rhythm, no peripheral edema, warm, nontender. Eyes: Conjunctivae clear without exudates or hemorrhage Neck: Supple, no carotid bruits. Pulmonary: Clear to auscultation bilaterally   NEUROLOGICAL EXAM:  MENTAL STATUS: Speech:    Speech is normal; fluent and spontaneous with normal comprehension.  Cognition:     Orientation to time, place and person     Normal recent and remote memory     Normal Attention span and concentration     Normal Language, naming, repeating,spontaneous speech     Fund of knowledge   CRANIAL NERVES: CN II: Visual fields are full to confrontation. Fundoscopic exam is normal with sharp discs and no vascular changes. Pupils are round equal and briskly reactive to light. CN III, IV, VI: extraocular movement are normal. No ptosis. CN V: Facial sensation is intact to pinprick in all 3 divisions bilaterally. Corneal responses are intact.  CN VII: Face is symmetric with normal eye closure and smile. CN VIII: Hearing is normal to rubbing fingers CN IX, X: Palate elevates symmetrically. Phonation is normal. CN XI: Head turning and shoulder shrug are intact CN XII: Tongue is midline with normal movements and no atrophy.  MOTOR: He has mild posturing tremor. Muscle bulk and tone are normal. Muscle strength is normal.  REFLEXES: Reflexes are 2+ and symmetric at the biceps, triceps, knees, and ankles. Plantar responses are flexor.  SENSORY: Intact to light touch,  pinprick, positional sensation and vibratory sensation are intact in fingers and toes.  COORDINATION: Rapid alternating movements and fine finger movements are intact. There is no dysmetria on finger-to-nose and heel-knee-shin.    GAIT/STANCE: Posture is normal. Gait is steady with normal steps, base, arm swing, and turning. Heel and toe walking are normal. Tandem gait is normal.  Romberg is absent.   DIAGNOSTIC DATA (LABS, IMAGING, TESTING) - I reviewed patient records, labs, notes, testing and imaging myself where available.   ASSESSMENT AND PLAN  [name] is a 70 y.o. male  Essential tremor   No family history   After discuss with patient, he wants to proceed with treatment trial, start primidone, 50 mg titrating to 2 tablets every night    Marcial Pacas, M.D. Ph.D.  [facility] [address] [place], [place] [address] Ph: [phone] Fax: [phone]  CK:[id number] Moreen Fowler, MD

## 2016-02-04 ENCOUNTER — Telehealth: Payer: Self-pay | Admitting: *Deleted

## 2016-02-04 DIAGNOSIS — Z961 Presence of intraocular lens: Secondary | ICD-10-CM | POA: Diagnosis not present

## 2016-02-04 DIAGNOSIS — H26492 Other secondary cataract, left eye: Secondary | ICD-10-CM | POA: Diagnosis not present

## 2016-02-04 MED ORDER — PROPRANOLOL HCL 40 MG PO TABS: 40.0000 mg | ORAL_TABLET | Freq: Two times a day (BID) | ORAL | 11 refills | Status: DC

## 2016-02-04 NOTE — Telephone Encounter (Signed)
Received call from patient stating he took Primidone x 3 days, but stopped taking it on 12/4 due to "Having a really bad vivid dream." Patient is requesting call back from RN to advise what he should do.

## 2016-02-04 NOTE — Telephone Encounter (Signed)
He only took Primidone 50mg , one tablet at bedtime for three days.  He had a very vivid and real nightmare. He has since stopped the medication and is asking for an alternate medication.

## 2016-02-04 NOTE — Telephone Encounter (Signed)
Dr. Krista Blue has reviewed patient's chart - she has provided him with a prescription for propranolol 40mg , BID.  Patient is agreeable to plan.  New rx sent to the pharmacy.

## 2016-02-06 DIAGNOSIS — D696 Thrombocytopenia, unspecified: Secondary | ICD-10-CM | POA: Diagnosis not present

## 2016-02-19 DIAGNOSIS — H2512 Age-related nuclear cataract, left eye: Secondary | ICD-10-CM | POA: Diagnosis not present

## 2016-02-19 DIAGNOSIS — H25012 Cortical age-related cataract, left eye: Secondary | ICD-10-CM | POA: Diagnosis not present

## 2016-02-19 DIAGNOSIS — H02839 Dermatochalasis of unspecified eye, unspecified eyelid: Secondary | ICD-10-CM | POA: Diagnosis not present

## 2016-02-19 DIAGNOSIS — Z961 Presence of intraocular lens: Secondary | ICD-10-CM | POA: Diagnosis not present

## 2016-02-19 DIAGNOSIS — H26492 Other secondary cataract, left eye: Secondary | ICD-10-CM | POA: Diagnosis not present

## 2016-02-19 DIAGNOSIS — H25042 Posterior subcapsular polar age-related cataract, left eye: Secondary | ICD-10-CM | POA: Diagnosis not present

## 2016-02-19 DIAGNOSIS — I10 Essential (primary) hypertension: Secondary | ICD-10-CM | POA: Diagnosis not present

## 2016-02-26 DIAGNOSIS — M545 Low back pain: Secondary | ICD-10-CM | POA: Diagnosis not present

## 2016-03-01 DIAGNOSIS — M545 Low back pain: Secondary | ICD-10-CM | POA: Diagnosis not present

## 2016-03-03 DIAGNOSIS — M545 Low back pain: Secondary | ICD-10-CM | POA: Diagnosis not present

## 2016-03-10 ENCOUNTER — Telehealth: Payer: Self-pay | Admitting: *Deleted

## 2016-03-10 NOTE — Telephone Encounter (Signed)
Pt called to follow up on CT -not scheduled yet   States he has follow up with Dr Alen Blew on 05/09/16

## 2016-03-11 NOTE — Telephone Encounter (Signed)
Darlena,  would you please check on the status of this patient's CT scan? Please call me when it has been approved, so that I may call the patient. My [place] is [phone], thank you, Danitza Schoenfeldt

## 2016-03-12 ENCOUNTER — Other Ambulatory Visit: Payer: Self-pay | Admitting: Oncology

## 2016-03-12 DIAGNOSIS — C61 Malignant neoplasm of prostate: Secondary | ICD-10-CM

## 2016-03-12 NOTE — Telephone Encounter (Signed)
[name] calling to say she does not see an order for a CT

## 2016-03-12 NOTE — Telephone Encounter (Signed)
Orders are in

## 2016-03-14 ENCOUNTER — Telehealth: Payer: Self-pay | Admitting: Oncology

## 2016-03-14 NOTE — Telephone Encounter (Signed)
Called patient to confirm Lab appointment, which is prior to CT for 04/30/16. Left msg on patient's voice mail. 03/14/16.

## 2016-03-18 DIAGNOSIS — M545 Low back pain: Secondary | ICD-10-CM | POA: Diagnosis not present

## 2016-03-18 DIAGNOSIS — M5126 Other intervertebral disc displacement, lumbar region: Secondary | ICD-10-CM | POA: Diagnosis not present

## 2016-03-24 DIAGNOSIS — C61 Malignant neoplasm of prostate: Secondary | ICD-10-CM | POA: Diagnosis not present

## 2016-04-08 ENCOUNTER — Other Ambulatory Visit: Payer: Self-pay | Admitting: *Deleted

## 2016-04-08 ENCOUNTER — Telehealth: Payer: Self-pay | Admitting: Neurology

## 2016-04-08 NOTE — Telephone Encounter (Signed)
Patient calling regarding medication propranolol (INDERAL) 40 MG tablet. He says the medication does not help that much and wants to discuss discontinuing it.

## 2016-04-08 NOTE — Telephone Encounter (Signed)
Ok, per vo by Dr. Krista Blue, to discontinue medication.  He does not wish to try anything new, at this time.  Feels his tremors are not significant enough to take medication for right now.  He will call back with any further concerns.

## 2016-04-08 NOTE — Addendum Note (Signed)
Addended by: Desmond Lope on: 04/08/2016 03:53 PM   Modules accepted: Orders

## 2016-04-16 DIAGNOSIS — M6281 Muscle weakness (generalized): Secondary | ICD-10-CM | POA: Diagnosis not present

## 2016-04-16 DIAGNOSIS — N393 Stress incontinence (female) (male): Secondary | ICD-10-CM | POA: Diagnosis not present

## 2016-04-16 DIAGNOSIS — C61 Malignant neoplasm of prostate: Secondary | ICD-10-CM | POA: Diagnosis not present

## 2016-04-21 DIAGNOSIS — N393 Stress incontinence (female) (male): Secondary | ICD-10-CM | POA: Diagnosis not present

## 2016-04-21 DIAGNOSIS — R151 Fecal smearing: Secondary | ICD-10-CM | POA: Diagnosis not present

## 2016-04-21 DIAGNOSIS — M62838 Other muscle spasm: Secondary | ICD-10-CM | POA: Diagnosis not present

## 2016-04-21 DIAGNOSIS — M6281 Muscle weakness (generalized): Secondary | ICD-10-CM | POA: Diagnosis not present

## 2016-04-28 DIAGNOSIS — N393 Stress incontinence (female) (male): Secondary | ICD-10-CM | POA: Diagnosis not present

## 2016-04-28 DIAGNOSIS — M62838 Other muscle spasm: Secondary | ICD-10-CM | POA: Diagnosis not present

## 2016-04-28 DIAGNOSIS — R151 Fecal smearing: Secondary | ICD-10-CM | POA: Diagnosis not present

## 2016-04-28 DIAGNOSIS — M6281 Muscle weakness (generalized): Secondary | ICD-10-CM | POA: Diagnosis not present

## 2016-04-30 ENCOUNTER — Encounter (HOSPITAL_COMMUNITY): Payer: Self-pay

## 2016-04-30 ENCOUNTER — Other Ambulatory Visit (HOSPITAL_BASED_OUTPATIENT_CLINIC_OR_DEPARTMENT_OTHER): Payer: Medicare Other

## 2016-04-30 ENCOUNTER — Ambulatory Visit (HOSPITAL_COMMUNITY)
Admission: RE | Admit: 2016-04-30 | Discharge: 2016-04-30 | Disposition: A | Discharge status: Home / Self Care | Payer: Medicare Other | Source: Ambulatory Visit | Attending: Oncology | Admitting: Oncology

## 2016-04-30 DIAGNOSIS — I700 Atherosclerosis of aorta: Secondary | ICD-10-CM | POA: Insufficient documentation

## 2016-04-30 DIAGNOSIS — C61 Malignant neoplasm of prostate: Secondary | ICD-10-CM | POA: Insufficient documentation

## 2016-04-30 DIAGNOSIS — R918 Other nonspecific abnormal finding of lung field: Secondary | ICD-10-CM | POA: Diagnosis not present

## 2016-04-30 DIAGNOSIS — Z9889 Other specified postprocedural states: Secondary | ICD-10-CM | POA: Insufficient documentation

## 2016-04-30 DIAGNOSIS — I2510 Atherosclerotic heart disease of native coronary artery without angina pectoris: Secondary | ICD-10-CM | POA: Insufficient documentation

## 2016-04-30 LAB — COMPREHENSIVE METABOLIC PANEL
ALT: 16 U/L (ref 0–55)
AST: 22 U/L (ref 5–34)
Albumin: 4.2 g/dL (ref 3.5–5.0)
Alkaline Phosphatase: 48 U/L (ref 40–150)
Anion Gap: 6 mEq/L (ref 3–11)
BUN: 23.1 mg/dL (ref 7.0–26.0)
CO2: 26 mEq/L (ref 22–29)
Calcium: 10.1 mg/dL (ref 8.4–10.4)
Chloride: 106 mEq/L (ref 98–109)
Creatinine: 1 mg/dL (ref 0.7–1.3)
EGFR: 79 mL/min/{1.73_m2} — ABNORMAL LOW (ref >90)
Glucose: 94 mg/dl (ref 70–140)
Potassium: 4.2 mEq/L (ref 3.5–5.1)
Sodium: 139 mEq/L (ref 136–145)
Total Bilirubin: 0.7 mg/dL (ref 0.20–1.20)
Total Protein: 7 g/dL (ref 6.4–8.3)

## 2016-04-30 LAB — CBC WITH DIFFERENTIAL/PLATELET
BASO%: 1.5 % (ref 0.0–2.0)
Basophils Absolute: 0.1 10*3/uL (ref 0.0–0.1)
EOS%: 4.4 % (ref 0.0–7.0)
Eosinophils Absolute: 0.2 10*3/uL (ref 0.0–0.5)
HCT: 38.8 % (ref 38.4–49.9)
HGB: 13.1 g/dL (ref 13.0–17.1)
LYMPH%: 20.3 % (ref 14.0–49.0)
MCH: 30.4 pg (ref 27.2–33.4)
MCHC: 33.7 g/dL (ref 32.0–36.0)
MCV: 90.3 fL (ref 79.3–98.0)
MONO#: 0.4 10*3/uL (ref 0.1–0.9)
MONO%: 10.7 % (ref 0.0–14.0)
NEUT#: 2.6 10*3/uL (ref 1.5–6.5)
NEUT%: 63.1 % (ref 39.0–75.0)
Platelets: 178 10*3/uL (ref 140–400)
RBC: 4.29 10*6/uL (ref 4.20–5.82)
RDW: 13.5 % (ref 11.0–14.6)
WBC: 4.2 10*3/uL (ref 4.0–10.3)
lymph#: 0.8 10*3/uL — ABNORMAL LOW (ref 0.9–3.3)

## 2016-04-30 MED ORDER — IOPAMIDOL (ISOVUE-300) INJECTION 61%
INTRAVENOUS | Status: AC
  Administered 2016-04-30: 100 mL
  Filled 2016-04-30: qty 100

## 2016-05-01 LAB — PSA: Prostate Specific Ag, Serum: <0.1 ng/mL (ref 0.0–4.0)

## 2016-05-08 ENCOUNTER — Encounter: Payer: Self-pay | Admitting: Internal Medicine

## 2016-05-09 ENCOUNTER — Telehealth: Payer: Self-pay | Admitting: Oncology

## 2016-05-09 ENCOUNTER — Ambulatory Visit (HOSPITAL_BASED_OUTPATIENT_CLINIC_OR_DEPARTMENT_OTHER): Payer: Medicare Other | Admitting: Oncology

## 2016-05-09 VITALS — BP 137/65 | HR 65 | Temp 98.4°F | Resp 18 | Ht 71.0 in | Wt 185.9 lb

## 2016-05-09 DIAGNOSIS — C61 Malignant neoplasm of prostate: Secondary | ICD-10-CM | POA: Diagnosis not present

## 2016-05-09 NOTE — Progress Notes (Signed)
Hematology and Oncology Follow Up Visit  [name] [id number] 1945-04-02 71 y.o. 05/09/2016 9:49 AM    Principle Diagnosis: 71 year old gentleman diagnosed with prostate cancer in November of 2012 presented with a PSA of 4.38 and found to have a Gleason score 4+3 equals 7, his clinical staging was T2c N1. 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    Prior Therapy: He presented with a PSA of 4.38 and subsequently underwent a radical prostatectomy and lymph node dissection on November of 2012. 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  He did not have any evidence of angiolymphatic invasion or extraprostatic extension. The margins were negative. He had one out of 12 lymph nodes involved with cancer predominantly in the right pelvic wall.  His post operative PSA remained at 0.65 and subsequently to 0.89. And in March of 2013 he was started on Lupron and have been on it since that time. His PSA remains very low 0.01.  His PSA went up to 0.3 in March 2017. He completed salvage radiation therapy completed in June 2017 under the care of Dr. Tammi Klippel. He received planned dose of 68.4 grays.  Current therapy: Lupron given every 6 months under the care of Dr. Alinda Money. This has been given intermittently he is off treatment at this time.  Interim History:  [name] presents today for a follow-up visit. Since the last visit, he reports doing well without any complaints. He remains active and attends activities of daily living. He did have exacerbation of his hip and back pain which have resolved at this time. He does report some occasional urinary incontinence and has participated in physical therapy to improve those symptoms. He does not report any hematuria or dysuria. He does not report any pelvic pain or discomfort. He denied any testicular pain.   He does not report any headaches,  blurry vision, syncope or seizures. He has not reported any weight loss or appetite changes. Did not report any constitutional symptoms. Does not report any abdominal pain or satiety. Did not report any back pain shoulder pain or any bone discomfort. Has not reported any hematochezia or melena. He does not report any headaches or blurry vision or syncope. He does not report any lymphadenopathy or petechiae. The remaining review of systems is unremarkable.   Medications: I have reviewed the patient's current medications.  Current Outpatient Prescriptions  Medication Sig Dispense Refill  . ascorbic acid (VITAMIN C) 1000 MG tablet Take 1,000 mg by mouth 3 (three) times a week.    Marland Kitchen aspirin EC 81 MG tablet Take 81 mg by mouth daily.    . benazepril-hydrochlorthiazide (LOTENSIN HCT) 20-12.5 MG per tablet Take 1 tablet by mouth every morning.      . Calcium Carbonate-Vitamin D (CALCIUM 600+D3 PO) Take 1 tablet by mouth daily. Patient takes 1-2 tablets daily    . diphenhydrAMINE (BENADRYL) 25 MG tablet Take 25 mg by mouth every 8 (eight) hours as needed. For allergies    . lisinopril-hydrochlorothiazide (PRINZIDE,ZESTORETIC) 20-12.5 MG tablet Take 1 tablet by mouth daily.  1  . MULTIPLE VITAMIN PO Take 1 tablet by mouth daily.    . naproxen (NAPROSYN) 500 MG tablet TAKE 1 TABLET BY MOUTH EVERY 12 HOURS WITH FOOD OR MILK AS NEEDED X 2 WEEKS, THEN AS NEEDED  0  . propranolol (INDERAL) 40 MG tablet TAKE 1 TABLET (40 MG TOTAL) BY MOUTH 2 (TWO) TIMES DAILY.  11  . simvastatin (  ZOCOR) 20 MG tablet Take 20 mg by mouth at bedtime.     Marland Kitchen zolpidem (AMBIEN CR) 6.25 MG CR tablet Take 6.25 mg by mouth at bedtime as needed for sleep.     No current facility-administered medications for this visit.      Allergies: No Known Allergies  Past Medical History, Surgical history, Social history, and Family History were reviewed and updated.  Physical Exam: Blood pressure 137/65, pulse 65, temperature 98.4 F (36.9  C), temperature source Oral, resp. rate 18, height 5\' 11"  (1.803 m), weight 185 lb 14.4 oz (84.3 kg), SpO2 100 %. ECOG: 0 General appearance: Well-appearing gentleman appeared without distress. Head: Normocephalic, without obvious abnormality no oral thrush noted. Neck: no adenopathy Lymph nodes: Cervical, supraclavicular, and axillary nodes normal. Heart:regular rate and rhythm, S1, S2 normal, no murmur, click, rub or gallop Lung:chest clear, no wheezing, rales, normal symmetric air entry no dullness to percussion. Abdomin: soft, non-tender, without masses or organomegaly no rebound or guarding. EXT:no erythema, induration, or nodules   Lab Results: Lab Results  Component Value Date   WBC 4.2 04/30/2016   HGB 13.1 04/30/2016   HCT 38.8 04/30/2016   MCV 90.3 04/30/2016   PLT 178 04/30/2016     Chemistry      Component Value Date/Time   NA 139 04/30/2016 0836   K 4.2 04/30/2016 0836   CL 101 01/14/2011 0517   CO2 26 04/30/2016 0836   BUN 23.1 04/30/2016 0836   CREATININE 1.0 04/30/2016 0836      Component Value Date/Time   CALCIUM 10.1 04/30/2016 0836   ALKPHOS 48 04/30/2016 0836   AST 22 04/30/2016 0836   ALT 16 04/30/2016 0836   BILITOT 0.70 04/30/2016 0836      EXAM: CT CHEST, ABDOMEN, AND PELVIS WITH CONTRAST  TECHNIQUE: Multidetector CT imaging of the chest, abdomen and pelvis was performed following the standard protocol during bolus administration of intravenous contrast.  CONTRAST:  1 ISOVUE-300 IOPAMIDOL (ISOVUE-300) INJECTION 61%  COMPARISON:  05/09/2015  FINDINGS: CT CHEST FINDINGS  Cardiovascular: Tortuous thoracic aorta. Normal heart size, without pericardial effusion. Multivessel coronary artery atherosclerosis. No central pulmonary embolism, on this non-dedicated study.  Mediastinum/Nodes: No supraclavicular adenopathy. No mediastinal or hilar adenopathy.  Lungs/Pleura: No pleural fluid. Scattered pulmonary nodules on the order of 3  mm are similar and can be presumed benign.  Musculoskeletal: No acute osseous abnormality.  CT ABDOMEN PELVIS FINDINGS  Hepatobiliary: Too small to characterize liver lesions are similar. Normal gallbladder, without biliary ductal dilatation.  Pancreas: Normal, without mass or ductal dilatation.  Spleen: Normal in size, without focal abnormality.  Adrenals/Urinary Tract: Normal adrenal glands. Left renal cysts. Right renal too small to characterize lesion. No hydronephrosis. Normal urinary bladder.  Stomach/Bowel: Normal stomach, without wall thickening. Normal colon and terminal ileum. Normal small bowel.  Vascular/Lymphatic: Aortic and branch vessel atherosclerosis. No abdominopelvic adenopathy.  Reproductive: Prostatectomy.  No locally recurrent disease.  Other: No significant free fluid. Tiny fat containing left inguinal hernia. Low-density in the right inguinal canal is incompletely imaged.  Musculoskeletal: Tiny left iliac bone island is unchanged. Disc bulges including at L 3-4.  IMPRESSION: 1. Prostatectomy, without recurrent or metastatic disease. 2. Similar bilateral pulmonary nodules, which can be presumed benign. 3.  Coronary artery atherosclerosis. Aortic atherosclerosis. 4. Low-density in the right inguinal canal could represent inguinal position of the right testicle. Consider physical exam correlation.    Impression and Plan:  71 year old gentleman with the following issues:  1. Prostate  cancer diagnosed in November of 2012 presented with a PSA of 4.38 and found to have a Gleason score 4+3 equals 7, his clinical staging was T2c N1. He is status post robotic prostatectomy and lymphadenectomy with one of 12 lymph nodes were involved. He continued to have elevated PSA that is detectable around 0.68 and 0.84 and had been on Lupron since March of 2013.  PSA on 05/09/2015 was up to 0.29. His CT scan chest abdomen and pelvis on 05/09/2015 showed  no evidence of measurable disease.  He is status post radiation therapy for salvage purposes and a follow-up PSA was 0.1 in July 2017. He has received intermittent androgen deprivation and he is currently off therapy.   CT scan as well as laboratory data were reviewed today obtain on 04/30/2016. His PSA continues to be undetectable of less than 0.1 with a normal CT scan. Based on these findings, I see no evidence to suggest recurrent disease and I have recommended continued observation and surveillance.    2. Androgen depravation: Prescribed by Dr. Alinda Money intermittently. He is off treatment at this time.  3. Bone health: I agree with calcium and vitamin D and repeat bone density frequently.  4. Follow-up: Will be in 4 months for a repeat laboratory data and repeat CT scan in 8-12 months.  [facility], MD 3/16/20189:49 AM

## 2016-05-09 NOTE — Telephone Encounter (Signed)
Appointments scheduled per 3.16.18 LOS. Patient given AVS report and calendars with future scheduled appointments.  °

## 2016-05-12 DIAGNOSIS — R151 Fecal smearing: Secondary | ICD-10-CM | POA: Diagnosis not present

## 2016-05-12 DIAGNOSIS — M6281 Muscle weakness (generalized): Secondary | ICD-10-CM | POA: Diagnosis not present

## 2016-05-12 DIAGNOSIS — N393 Stress incontinence (female) (male): Secondary | ICD-10-CM | POA: Diagnosis not present

## 2016-05-12 DIAGNOSIS — M62838 Other muscle spasm: Secondary | ICD-10-CM | POA: Diagnosis not present

## 2016-05-17 NOTE — Progress Notes (Signed)
Cardiology Office Note   Date:  05/19/2016   ID:  [name] 1946-02-19, MRN [id number]  PCP:  Gara Kroner, MD  Cardiologist:   Dorris Carnes, MD   F/U of CP, HL      History of Present Illness: [name] is a 71 y.o. male with a history of CP and HL   Mild arterial calcificatoins of CT   I saw him in march 2017  Breathing is good  No CP   Has tremor in L hand  Seen at [facility]  Dr [name] Border one med   Switched to propranolol  Concerned about safety with other meds       Current Meds  Medication Sig  . ascorbic acid (VITAMIN C) 1000 MG tablet Take 1,000 mg by mouth 3 (three) times a week.  Marland Kitchen aspirin EC 81 MG tablet Take 81 mg by mouth daily.  . Calcium Carbonate-Vitamin D (CALCIUM 600+D3 PO) Take 1 tablet by mouth daily. Patient takes 1-2 tablets daily  . lisinopril-hydrochlorothiazide (PRINZIDE,ZESTORETIC) 20-12.5 MG tablet Take 1 tablet by mouth daily.  . MULTIPLE VITAMIN PO Take 1 tablet by mouth daily.  . simvastatin (ZOCOR) 20 MG tablet Take 20 mg by mouth at bedtime.   Marland Kitchen zolpidem (AMBIEN CR) 6.25 MG CR tablet Take 6.25 mg by mouth at bedtime as needed for sleep.     Allergies:   Patient has no known allergies.   Past Medical History:  Diagnosis Date  . Arthritis    hands   . Cancer [facility])    prostate cancer   . Hypertension   . Prostate cancer ([place])   . Recurrent upper respiratory infection (URI)    pt had a cold recent- week ago - better now   . Tremor     Past Surgical History:  Procedure Laterality Date  . CATARACT EXTRACTION Bilateral   . OTHER SURGICAL HISTORY  2005   right hand middle finger surgery   . ROBOT ASSISTED LAPAROSCOPIC RADICAL PROSTATECTOMY  01/13/2011   Procedure: ROBOTIC ASSISTED LAPAROSCOPIC RADICAL PROSTATECTOMY LEVEL 2;  Surgeon: Dutch Gray, MD;  Location: [facility];  Service: Urology;  Laterality: Bilateral;  Robotic Assisted Laparoscopic Prostatetectomy with Bilateral Lymphadenectomy  Level 2     Social  History:  The patient  reports that he has never smoked. He has never used smokeless tobacco. He reports that he drinks alcohol. He reports that he does not use drugs.   Family History:  The patient's family history includes Cancer in his mother; Heart attack in his father; Hypertension in his father and sister.    ROS:  Please see the history of present illness. All other systems are reviewed and  Negative to the above problem except as noted.    PHYSICAL EXAM: VS:  BP 134/80   Pulse 60   Ht 5\' 11"  (1.803 m)   Wt 185 lb 12.8 oz (84.3 kg)   BMI 25.91 kg/m   GEN: Well nourished, well developed, in no acute distress  HEENT: normal  Neck: no JVD, carotid bruits, or masses Cardiac: RRR; no murmurs, rubs, or gallops,no edema  Respiratory:  clear to auscultation bilaterally, normal work of breathing GI: soft, nontender, nondistended, + BS  No hepatomegaly  MS: no deformity Moving all extremities   Skin: warm and dry, no rash Neuro:  Strength and sensation are intact  Tremor present in hands   Psych: euthymic mood, full affect   EKG:  EKG is  ordered today.  SR 60 bpm  LAD     Lipid Panel    Component Value Date/Time   CHOL 110 (L) 05/25/2015 0857   TRIG 75 05/25/2015 0857   HDL 38 (L) 05/25/2015 0857   CHOLHDL 2.9 05/25/2015 0857   VLDL 15 05/25/2015 0857   LDLCALC 57 05/25/2015 0857      Wt Readings from Last 3 Encounters:  05/19/16 185 lb 12.8 oz (84.3 kg)  05/09/16 185 lb 14.4 oz (84.3 kg)  01/24/16 179 lb 8 oz (81.4 kg)      ASSESSMENT AND PLAN:  1  CP  No CP    2  HTN  BP is good  Better at home    2  HL Check lipid tdoay  3  Tremor  Will take propranolol  40 bid  Could cut back on lisinopril HCTZ if needed if BP runs low    Hgb in Dec was normal   F/U in 1 year   Current medicines are reviewed at length with the patient today.  The patient does not have concerns regarding medicines.  Signed, Dorris Carnes, MD  05/19/2016 10:47 AM    [address], [place], [place]  [address] Phone: [phone]; Fax: [phone]

## 2016-05-19 ENCOUNTER — Encounter: Payer: Self-pay | Admitting: Internal Medicine

## 2016-05-19 ENCOUNTER — Ambulatory Visit (INDEPENDENT_AMBULATORY_CARE_PROVIDER_SITE_OTHER): Payer: Medicare Other | Admitting: Internal Medicine

## 2016-05-19 VITALS — BP 134/80 | HR 60 | Ht 71.0 in | Wt 185.8 lb

## 2016-05-19 DIAGNOSIS — R251 Tremor, unspecified: Secondary | ICD-10-CM

## 2016-05-19 DIAGNOSIS — I10 Essential (primary) hypertension: Secondary | ICD-10-CM

## 2016-05-19 DIAGNOSIS — I2510 Atherosclerotic heart disease of native coronary artery without angina pectoris: Secondary | ICD-10-CM

## 2016-05-19 DIAGNOSIS — E785 Hyperlipidemia, unspecified: Secondary | ICD-10-CM | POA: Diagnosis not present

## 2016-05-19 MED ORDER — PROPRANOLOL HCL 40 MG PO TABS
40.0000 mg | ORAL_TABLET | Freq: Two times a day (BID) | ORAL | 6 refills | Status: DC | PRN
Start: 2016-05-19 — End: 2016-07-29

## 2016-05-19 NOTE — Patient Instructions (Signed)
Your physician recommends that you continue on your current medications as directed. Please refer to the Current Medication list given to you today.  Your physician recommends that you return for lab work today ([place])  Your physician wants you to follow-up in: [place].  You will receive a reminder letter in the mail two months in advance. If you don't receive a letter, please call our office to schedule the follow-up appointment.

## 2016-05-20 ENCOUNTER — Telehealth: Payer: Self-pay | Admitting: Internal Medicine

## 2016-05-20 LAB — LIPID PANEL
Chol/HDL Ratio: 3.4 ratio units (ref 0.0–5.0)
Cholesterol, Total: 129 mg/dL (ref 100–199)
HDL: 38 mg/dL — ABNORMAL LOW (ref >39)
LDL Calculated: 64 mg/dL (ref 0–99)
Triglycerides: 136 mg/dL (ref 0–149)
VLDL Cholesterol Cal: 27 mg/dL (ref 5–40)

## 2016-05-20 NOTE — Telephone Encounter (Signed)
Pt is aware of lab work and MD's recommendations.

## 2016-05-20 NOTE — Telephone Encounter (Signed)
New message   Pt is returning call about his labs.

## 2016-05-26 DIAGNOSIS — N393 Stress incontinence (female) (male): Secondary | ICD-10-CM | POA: Diagnosis not present

## 2016-05-26 DIAGNOSIS — M6281 Muscle weakness (generalized): Secondary | ICD-10-CM | POA: Diagnosis not present

## 2016-05-26 DIAGNOSIS — R151 Fecal smearing: Secondary | ICD-10-CM | POA: Diagnosis not present

## 2016-05-26 DIAGNOSIS — M62838 Other muscle spasm: Secondary | ICD-10-CM | POA: Diagnosis not present

## 2016-06-10 DIAGNOSIS — D0359 Melanoma in situ of other part of trunk: Secondary | ICD-10-CM | POA: Diagnosis not present

## 2016-06-10 DIAGNOSIS — D485 Neoplasm of uncertain behavior of skin: Secondary | ICD-10-CM | POA: Diagnosis not present

## 2016-06-10 DIAGNOSIS — L814 Other melanin hyperpigmentation: Secondary | ICD-10-CM | POA: Diagnosis not present

## 2016-06-10 DIAGNOSIS — L570 Actinic keratosis: Secondary | ICD-10-CM | POA: Diagnosis not present

## 2016-06-10 DIAGNOSIS — Z85828 Personal history of other malignant neoplasm of skin: Secondary | ICD-10-CM | POA: Diagnosis not present

## 2016-06-10 DIAGNOSIS — L820 Inflamed seborrheic keratosis: Secondary | ICD-10-CM | POA: Diagnosis not present

## 2016-06-10 DIAGNOSIS — L821 Other seborrheic keratosis: Secondary | ICD-10-CM | POA: Diagnosis not present

## 2016-06-26 DIAGNOSIS — Z85828 Personal history of other malignant neoplasm of skin: Secondary | ICD-10-CM | POA: Diagnosis not present

## 2016-06-26 DIAGNOSIS — D0359 Melanoma in situ of other part of trunk: Secondary | ICD-10-CM | POA: Diagnosis not present

## 2016-06-26 DIAGNOSIS — C4359 Malignant melanoma of other part of trunk: Secondary | ICD-10-CM | POA: Diagnosis not present

## 2016-06-26 IMAGING — CT CT CHEST W/ CM
2 of 5 series · 14 of 46 positions shown, 16 images · IV contrast (omnipaque)
Comparison: CT of the chest, abdomen and pelvis 09/05/2013.

CLINICAL DATA: Subsequent evaluation of a 68-year-old male with
history prostate cancer diagnosed 1901. Follow-up examination.

EXAM:
CT CHEST, ABDOMEN, AND PELVIS WITH CONTRAST
TECHNIQUE: Multidetector CT imaging of the chest, abdomen and pelvis was
performed following the standard protocol during bolus
administration of intravenous contrast.
CONTRAST:  100mL OMNIPAQUE IOHEXOL 300 MG/ML  SOLN

[Series 2: cap with st · axial · 0.82mm/px · z∈[-633,-48]mm · 11 of 133 slices shown, 13 images]
[im 8/133  soft-tissue]
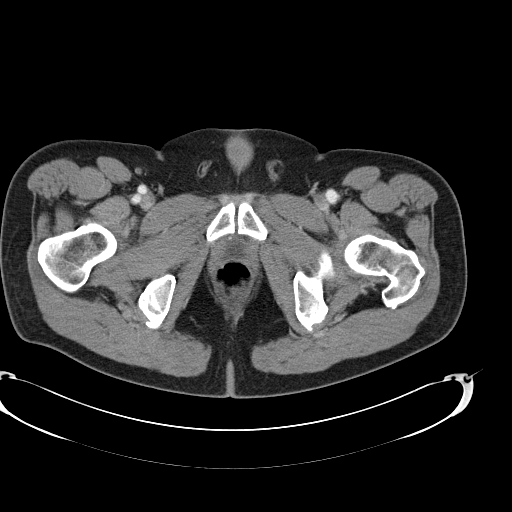
[im 8/133  bone]
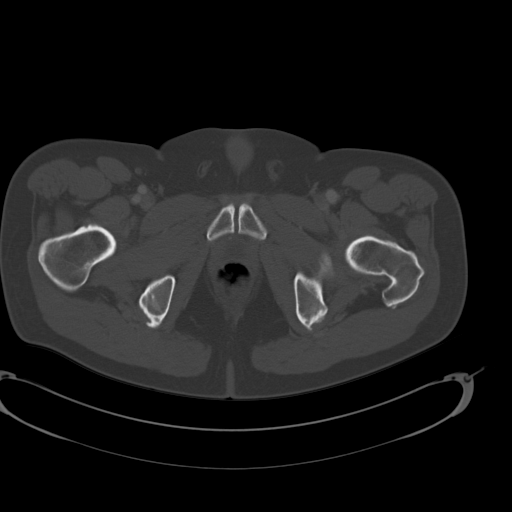
[im 23/133  soft-tissue]
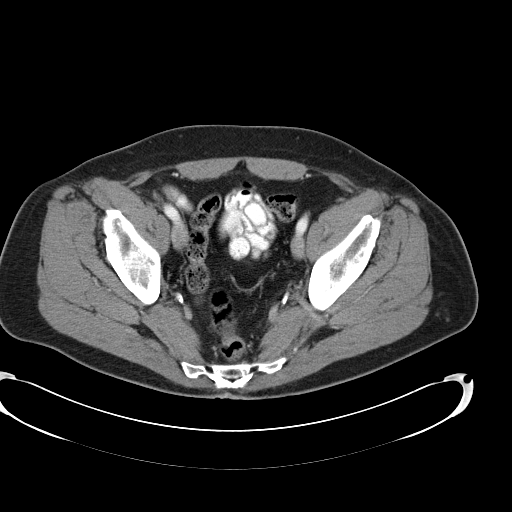
[im 30/133  soft-tissue]
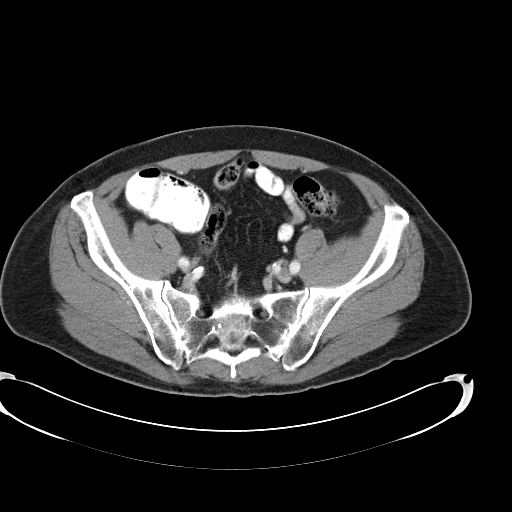
[im 45/133  soft-tissue]
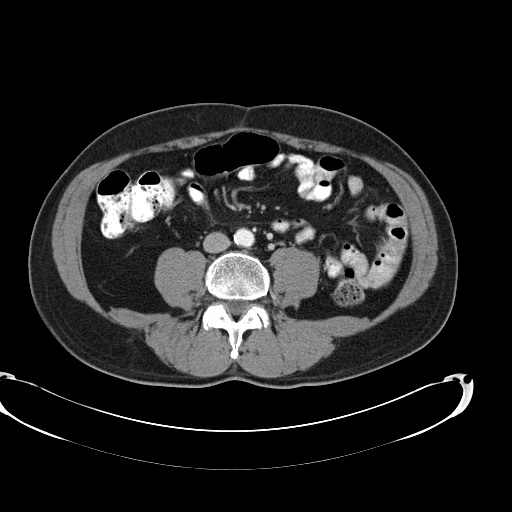
[im 52/133  soft-tissue]
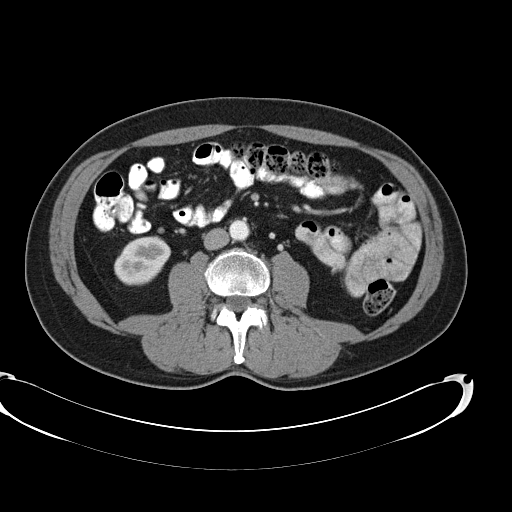
[im 67/133  soft-tissue]
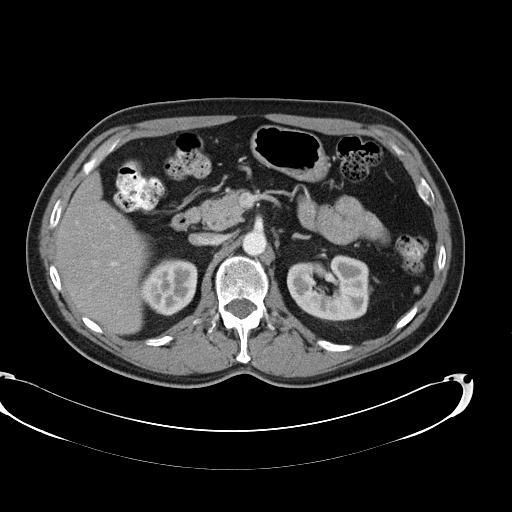
[im 81/133  soft-tissue]
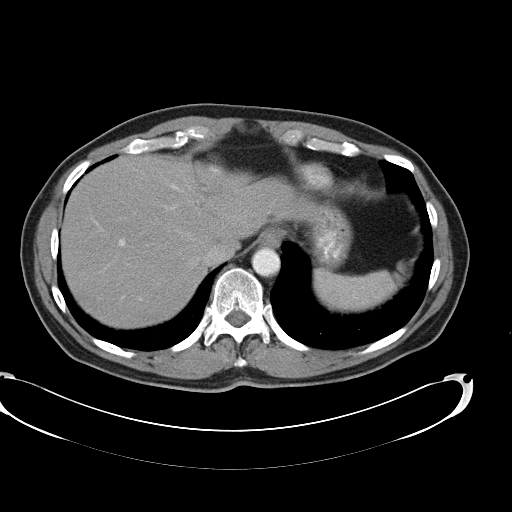
[im 89/133  soft-tissue]
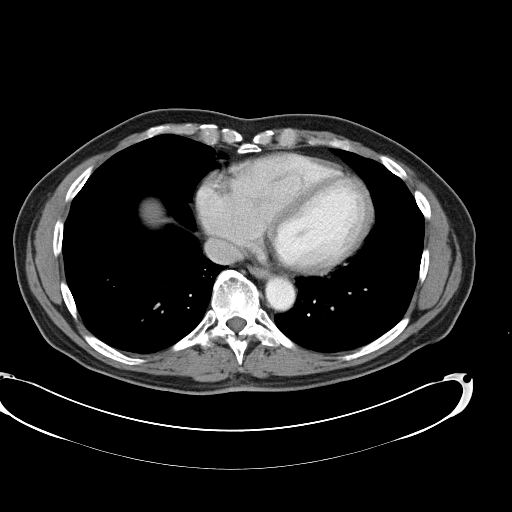
[im 103/133  soft-tissue]
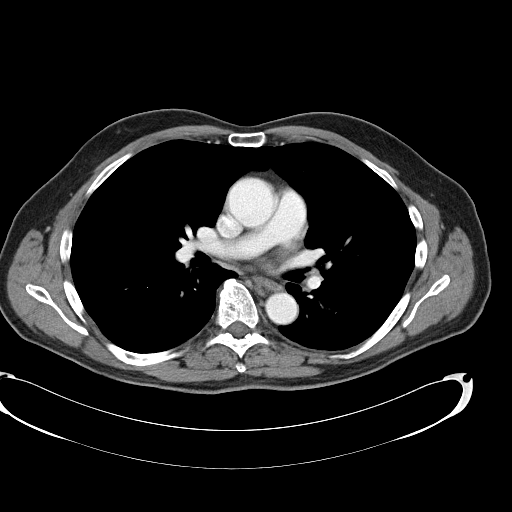
[im 103/133  bone]
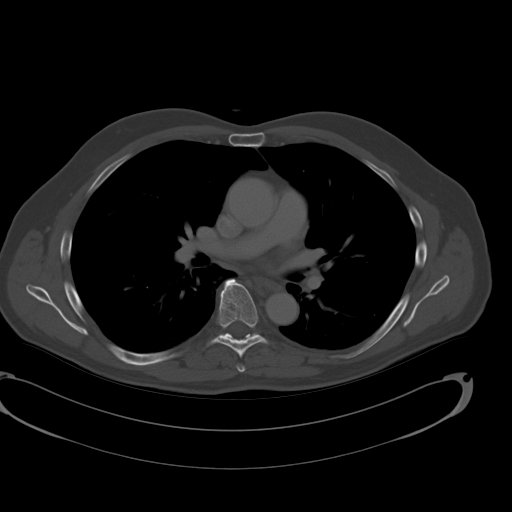
[im 111/133  soft-tissue]
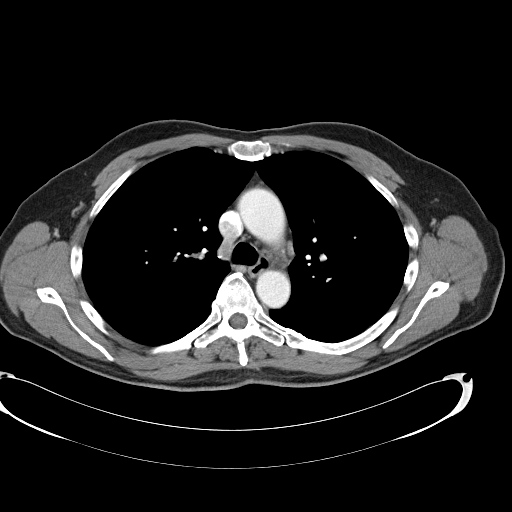
[im 125/133  soft-tissue]
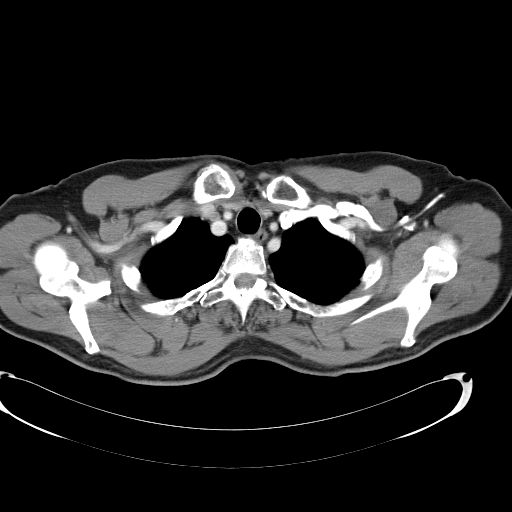

[Series 603: <mpr thick range> · coronal · 1.30mm/px · 3 of 88 slices shown]
[im 30/88  soft-tissue]
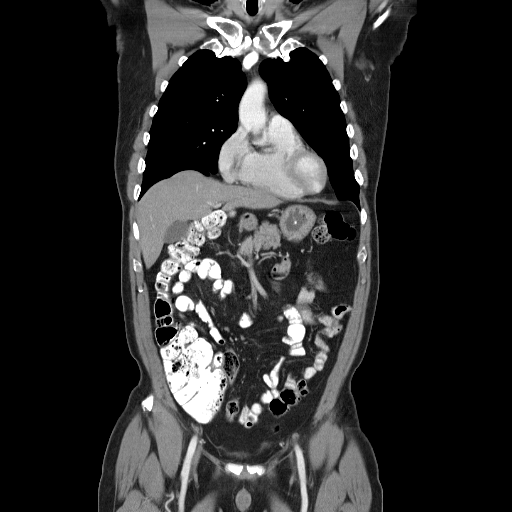
[im 39/88  soft-tissue]
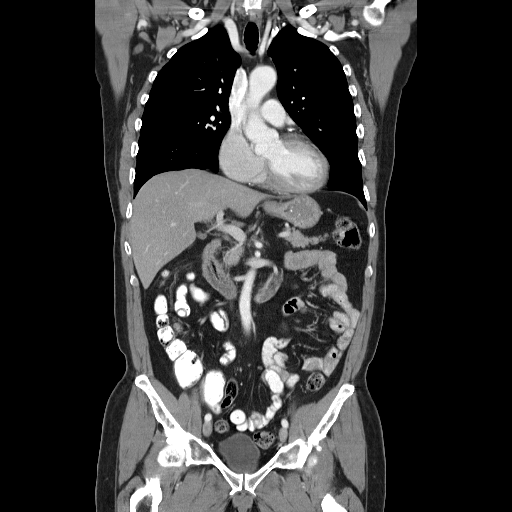
[im 49/88  soft-tissue]
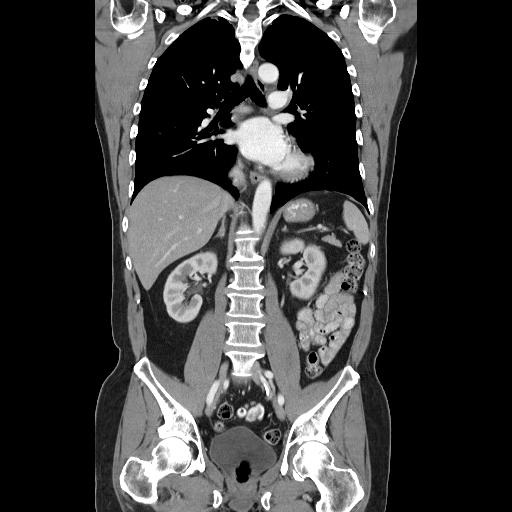

[14 of 46 positions shown; findings below may reference images not displayed]

FINDINGS: CT CHEST FINDINGS

Mediastinum/Lymph Nodes: Heart size is normal. There is no
significant pericardial fluid, thickening or pericardial
calcification. There is atherosclerosis of the thoracic aorta, the
great vessels of the mediastinum and the coronary arteries,
including calcified atherosclerotic plaque in the left anterior
descending, left circumflex and right coronary arteries. No
pathologically enlarged mediastinal or hilar lymph nodes. Esophagus
is unremarkable in appearance. No axillary lymphadenopathy.

Lungs/Pleura: A few tiny pulmonary nodules are noted, and are
unchanged in size, number and distribution compared to the prior
study, with the largest of these nodules measuring 4 mm in the
subpleural aspect of the right lower lobe adjacent to the major
fissure (image 29 of series 4). No new suspicious appearing
pulmonary nodules or masses are otherwise noted. No acute
consolidative airspace disease. No pleural effusions.

Musculoskeletal/Soft Tissues: There are no aggressive appearing
lytic or blastic lesions noted in the visualized portions of the
skeleton.

CT ABDOMEN AND PELVIS FINDINGS

Hepatobiliary: 4 mm low attenuation lesion in segment 4A of the
liver (image 49 of series 2) and 4 mm low attenuation lesion in
segment 5 of the liver (image 61 of series 2) are both unchanged in
retrospect compared to the prior examination, and although too small
to definitively characterize, are favored to be benign lesions,
likely tiny cysts. There is also a tiny 2 mm low attenuation lesion
in segment 8 of the liver (image 56 of series 2), which cannot be
confirmed on prior examinations, but is very well-defined and
although not characterized on today's examination due to its small
size is favored to be benign, likely a small cyst. No larger more
suspicious appearing hepatic lesions. No intra or extrahepatic
biliary ductal dilatation. Gallbladder is normal in appearance.

Pancreas: Unremarkable.

Spleen: Unremarkable.

Adrenals/Urinary Tract: Exophytic 11 mm low-attenuation lesions in
the interpolar and lower pole regions of the left kidney are
unchanged, compatible with small simple cysts. Sub cm
low-attenuation lesion in the medial aspect of the interpolar region
of the right kidney is too small to characterize, but is similar to
the prior study, presumably a small cyst. No hydroureteronephrosis.
Again noted is a small area of high density in the inferior aspect
of the urinary bladder near the orifice of the urethral orifice,
which is stable in size and appearance compared to the prior study,
favored to be postoperative change (less likely a bladder calculus).
Bilateral adrenal glands are normal in appearance.

Stomach/Bowel: The appearance of the stomach is normal. No
pathologic dilatation of small bowel or colon.

Vascular/Lymphatic: Extensive atherosclerosis throughout the
abdominal and pelvic vasculature, without evidence of aneurysm or
dissection. No lymphadenopathy noted in the abdomen or pelvis.

Reproductive: Status post radical prostatectomy. No soft tissue mass
noted in the prostatectomy bed to suggest local recurrence of
disease.

Other: No significant volume of ascites.  No pneumoperitoneum.

Musculoskeletal: There are no aggressive appearing lytic or blastic
lesions noted in the visualized portions of the skeleton.
IMPRESSION: 1. No findings to suggest metastatic disease to the chest, abdomen
or pelvis. No evidence of recurrent disease in the pelvis.
2. Multiple tiny liver lesions, as above. Two of these are stable
compared to the prior examination, while one of them is new. These
are all too small to definitively characterize, but are favored to
be benign. Attention on routine followup examinations is recommended
to ensure stability.
3. Unchanged tiny pulmonary nodules, largest of which measures only
4 mm in the subpleural aspect of the right lower lobe. These are
favored to be benign.
4. Atherosclerosis, including three vessel coronary artery disease.
Please note that although the presence of coronary artery calcium
documents the presence of coronary artery disease, the severity of
this disease and any potential stenosis cannot be assessed on this
non-gated CT examination. Assessment for potential risk factor
modification, dietary therapy or pharmacologic therapy may be
warranted, if clinically indicated.
5. Additional incidental findings, as above.

## 2016-07-02 DIAGNOSIS — M62838 Other muscle spasm: Secondary | ICD-10-CM | POA: Diagnosis not present

## 2016-07-02 DIAGNOSIS — R151 Fecal smearing: Secondary | ICD-10-CM | POA: Diagnosis not present

## 2016-07-02 DIAGNOSIS — M6281 Muscle weakness (generalized): Secondary | ICD-10-CM | POA: Diagnosis not present

## 2016-07-02 DIAGNOSIS — C61 Malignant neoplasm of prostate: Secondary | ICD-10-CM | POA: Diagnosis not present

## 2016-07-02 DIAGNOSIS — N393 Stress incontinence (female) (male): Secondary | ICD-10-CM | POA: Diagnosis not present

## 2016-07-09 DIAGNOSIS — C61 Malignant neoplasm of prostate: Secondary | ICD-10-CM | POA: Diagnosis not present

## 2016-07-29 ENCOUNTER — Encounter: Payer: Self-pay | Admitting: Neurology

## 2016-07-29 ENCOUNTER — Ambulatory Visit (INDEPENDENT_AMBULATORY_CARE_PROVIDER_SITE_OTHER): Payer: Medicare Other | Admitting: Neurology

## 2016-07-29 VITALS — BP 122/72 | HR 58 | Ht 71.0 in | Wt 183.5 lb

## 2016-07-29 DIAGNOSIS — G250 Essential tremor: Secondary | ICD-10-CM

## 2016-07-29 DIAGNOSIS — I10 Essential (primary) hypertension: Secondary | ICD-10-CM | POA: Diagnosis not present

## 2016-07-29 DIAGNOSIS — I2510 Atherosclerotic heart disease of native coronary artery without angina pectoris: Secondary | ICD-10-CM | POA: Diagnosis not present

## 2016-07-29 MED ORDER — PROPRANOLOL HCL 40 MG PO TABS: 40.0000 mg | ORAL_TABLET | Freq: Two times a day (BID) | ORAL | 4 refills | Status: DC

## 2016-07-29 NOTE — Progress Notes (Signed)
PATIENT: [name] DOB: 1945-06-24  Chief Complaint  Patient presents with  . Tremors    He is taking propranolol 40mg , BID which has been moderately helpful.  He has noticed an improvement especially when writing.     HISTORICAL  [name] is a 71 years old male, seen in refer by his primary care physician Dr. Antony Contras for evaluation of tremor.  Initial evaluation was on January 24 2016  I reviewed and summarized the referring note, he had a history of hyperlipidemia, hypertension, prostate cancer, thrombocytopenia.  He retired from Programme researcher, broadcasting/film/video, he noticed problem since age 65s, he could not type fast, it is gradually getting difficulty, noticed hand tremor holding a small cup.  He has difficulty signing his name.  He has no gait abnormality, no loss sense smell. No REM sleep disorder, has chronic insomnia, take Ambien prn for sleep. He denies head titubation, no voice tremor   He has no family history of tremor.  Laboratory evaluation in November 2017, normal TSH, cholesterol 139, LDL 67, hemoglobin 12 point 8, otherwise normal CBC, CMP, creatinine of 0.86, normal free T4,  He drinks a glass of wine or beer which does help his tremor some.  I personally reviewed MRI of the brain without contrast in 2004, that was normal  UPDATE July 29 2016: He is now taking propanolol 40mg  bid, which has helped him mild to moderately, he noticed less tremor, He can use his hand better  REVIEW OF SYSTEMS: Full 14 system review of systems performed and notable only for incontinence of bladder, insomnia, tremor  ALLERGIES: No Known Allergies  HOME MEDICATIONS: Current Outpatient Prescriptions  Medication Sig Dispense Refill  . ascorbic acid (VITAMIN C) 1000 MG tablet Take 1,000 mg by mouth 3 (three) times a week.    Marland Kitchen aspirin EC 81 MG tablet Take 81 mg by mouth daily.    . Calcium Carbonate-Vitamin D (CALCIUM 600+D3 PO) Take 1 tablet by mouth daily. Patient takes 1-2  tablets daily    . lisinopril-hydrochlorothiazide (PRINZIDE,ZESTORETIC) 20-12.5 MG tablet Take 1 tablet by mouth daily.  1  . MULTIPLE VITAMIN PO Take 1 tablet by mouth daily.    . propranolol (INDERAL) 40 MG tablet Take 1 tablet (40 mg total) by mouth 2 (two) times daily as needed. 60 tablet 6  . simvastatin (ZOCOR) 20 MG tablet Take 20 mg by mouth at bedtime.     Marland Kitchen zolpidem (AMBIEN CR) 6.25 MG CR tablet Take 6.25 mg by mouth at bedtime as needed for sleep.     No current facility-administered medications for this visit.     PAST MEDICAL HISTORY: Past Medical History:  Diagnosis Date  . Arthritis    hands   . Cancer [facility])    prostate cancer   . Hypertension   . Prostate cancer ([place])   . Recurrent upper respiratory infection (URI)    pt had a cold recent- week ago - better now   . Tremor     PAST SURGICAL HISTORY: Past Surgical History:  Procedure Laterality Date  . CATARACT EXTRACTION Bilateral   . OTHER SURGICAL HISTORY  2005   right hand middle finger surgery   . ROBOT ASSISTED LAPAROSCOPIC RADICAL PROSTATECTOMY  01/13/2011   Procedure: ROBOTIC ASSISTED LAPAROSCOPIC RADICAL PROSTATECTOMY LEVEL 2;  Surgeon: Dutch Gray, MD;  Location: [facility];  Service: Urology;  Laterality: Bilateral;  Robotic Assisted Laparoscopic Prostatetectomy with Bilateral Lymphadenectomy  Level 2    FAMILY HISTORY:  Family History  Problem Relation Age of Onset  . Heart attack Father   . Hypertension Father   . Cancer Mother        cervical with mets to lung  . Hypertension Sister     SOCIAL HISTORY:  Social History   Social History  . Marital status: Married    Spouse name: N/A  . Number of children: 2  . Years of education: Bachelors   Occupational History  . Retired    Social History Main Topics  . Smoking status: Never Smoker  . Smokeless tobacco: Never Used  . Alcohol use Yes     Comment: 1 drink per day or less  . Drug use: No  . Sexual activity: No   Other Topics Concern    . Not on file   Social History Narrative   Lives at home with his wife.   Right-handed.   No caffeine use.     PHYSICAL EXAM   Vitals:   07/29/16 0859  BP: 122/72  Pulse: (!) 58  Weight: 183 lb 8 oz (83.2 kg)  Height: 5\' 11"  (1.803 m)    Not recorded      Body mass index is 25.59 kg/m.  PHYSICAL EXAMNIATION:  Gen: NAD, conversant, well nourised, obese, well groomed                     Cardiovascular: Regular rate rhythm, no peripheral edema, warm, nontender. Eyes: Conjunctivae clear without exudates or hemorrhage Neck: Supple, no carotid bruits. Pulmonary: Clear to auscultation bilaterally   NEUROLOGICAL EXAM:  MENTAL STATUS: Speech:    Speech is normal; fluent and spontaneous with normal comprehension.  Cognition:     Orientation to time, place and person     Normal recent and remote memory     Normal Attention span and concentration     Normal Language, naming, repeating,spontaneous speech     Fund of knowledge   CRANIAL NERVES: CN II: Visual fields are full to confrontation. Fundoscopic exam is normal with sharp discs and no vascular changes. Pupils are round equal and briskly reactive to light. CN III, IV, VI: extraocular movement are normal. No ptosis. CN V: Facial sensation is intact to pinprick in all 3 divisions bilaterally. Corneal responses are intact.  CN VII: Face is symmetric with normal eye closure and smile. CN VIII: Hearing is normal to rubbing fingers CN IX, X: Palate elevates symmetrically. Phonation is normal. CN XI: Head turning and shoulder shrug are intact CN XII: Tongue is midline with normal movements and no atrophy.  MOTOR: He has mild posturing tremor. Muscle bulk and tone are normal. Muscle strength is normal.  REFLEXES: Reflexes are 2+ and symmetric at the biceps, triceps, knees, and ankles. Plantar responses are flexor.  SENSORY: Intact to light touch, pinprick, positional sensation and vibratory sensation are intact in  fingers and toes.  COORDINATION: Rapid alternating movements and fine finger movements are intact. There is no dysmetria on finger-to-nose and heel-knee-shin.    GAIT/STANCE: Posture is normal. Gait is steady with normal steps, base, arm swing, and turning. Heel and toe walking are normal. Tandem gait is normal.  Romberg is absent.   DIAGNOSTIC DATA (LABS, IMAGING, TESTING) - I reviewed patient records, labs, notes, testing and imaging myself where available.   ASSESSMENT AND PLAN  PAULA ZIETZ is a 71 y.o. male   Essential tremor   No family history   He could not tolerate primidone, cause vivid dreams  He reported mild to moderate improvement with inderal 40mg  bid. Refill his medications    Marcial Pacas, M.D. Ph.D.  [facility] [address] [place], [place] [address] Ph: [phone] Fax: [phone]  MV:[id number] Moreen Fowler, MD

## 2016-07-31 DIAGNOSIS — R151 Fecal smearing: Secondary | ICD-10-CM | POA: Diagnosis not present

## 2016-07-31 DIAGNOSIS — N393 Stress incontinence (female) (male): Secondary | ICD-10-CM | POA: Diagnosis not present

## 2016-07-31 DIAGNOSIS — M62838 Other muscle spasm: Secondary | ICD-10-CM | POA: Diagnosis not present

## 2016-07-31 DIAGNOSIS — M6281 Muscle weakness (generalized): Secondary | ICD-10-CM | POA: Diagnosis not present

## 2016-08-18 DIAGNOSIS — J069 Acute upper respiratory infection, unspecified: Secondary | ICD-10-CM | POA: Diagnosis not present

## 2016-09-02 DIAGNOSIS — M6281 Muscle weakness (generalized): Secondary | ICD-10-CM | POA: Diagnosis not present

## 2016-09-02 DIAGNOSIS — R151 Fecal smearing: Secondary | ICD-10-CM | POA: Diagnosis not present

## 2016-09-02 DIAGNOSIS — N393 Stress incontinence (female) (male): Secondary | ICD-10-CM | POA: Diagnosis not present

## 2016-09-02 DIAGNOSIS — M62838 Other muscle spasm: Secondary | ICD-10-CM | POA: Diagnosis not present

## 2016-09-09 ENCOUNTER — Other Ambulatory Visit (HOSPITAL_BASED_OUTPATIENT_CLINIC_OR_DEPARTMENT_OTHER): Payer: Medicare Other

## 2016-09-09 ENCOUNTER — Other Ambulatory Visit: Payer: Medicare Other

## 2016-09-09 DIAGNOSIS — C61 Malignant neoplasm of prostate: Secondary | ICD-10-CM

## 2016-09-09 LAB — COMPREHENSIVE METABOLIC PANEL
ALT: 18 U/L (ref 0–55)
AST: 21 U/L (ref 5–34)
Albumin: 4 g/dL (ref 3.5–5.0)
Alkaline Phosphatase: 53 U/L (ref 40–150)
Anion Gap: 12 mEq/L — ABNORMAL HIGH (ref 3–11)
BUN: 25.6 mg/dL (ref 7.0–26.0)
CO2: 24 mEq/L (ref 22–29)
Calcium: 9.8 mg/dL (ref 8.4–10.4)
Chloride: 105 mEq/L (ref 98–109)
Creatinine: 1 mg/dL (ref 0.7–1.3)
EGFR: 72 mL/min/{1.73_m2} — ABNORMAL LOW (ref >90)
Glucose: 101 mg/dl (ref 70–140)
Potassium: 3.9 mEq/L (ref 3.5–5.1)
Sodium: 141 mEq/L (ref 136–145)
Total Bilirubin: 0.39 mg/dL (ref 0.20–1.20)
Total Protein: 7 g/dL (ref 6.4–8.3)

## 2016-09-09 LAB — CBC WITH DIFFERENTIAL/PLATELET
BASO%: 1.4 % (ref 0.0–2.0)
Basophils Absolute: 0.1 10*3/uL (ref 0.0–0.1)
EOS%: 3.8 % (ref 0.0–7.0)
Eosinophils Absolute: 0.2 10*3/uL (ref 0.0–0.5)
HCT: 38.5 % (ref 38.4–49.9)
HGB: 13.1 g/dL (ref 13.0–17.1)
LYMPH%: 22.1 % (ref 14.0–49.0)
MCH: 30.5 pg (ref 27.2–33.4)
MCHC: 33.9 g/dL (ref 32.0–36.0)
MCV: 89.7 fL (ref 79.3–98.0)
MONO#: 0.6 10*3/uL (ref 0.1–0.9)
MONO%: 11.2 % (ref 0.0–14.0)
NEUT#: 3.2 10*3/uL (ref 1.5–6.5)
NEUT%: 61.5 % (ref 39.0–75.0)
Platelets: 184 10*3/uL (ref 140–400)
RBC: 4.29 10*6/uL (ref 4.20–5.82)
RDW: 13.4 % (ref 11.0–14.6)
WBC: 5.2 10*3/uL (ref 4.0–10.3)
lymph#: 1.2 10*3/uL (ref 0.9–3.3)

## 2016-09-10 LAB — PSA: Prostate Specific Ag, Serum: <0.1 ng/mL (ref 0.0–4.0)

## 2016-09-11 ENCOUNTER — Ambulatory Visit (HOSPITAL_BASED_OUTPATIENT_CLINIC_OR_DEPARTMENT_OTHER): Payer: Medicare Other | Admitting: Oncology

## 2016-09-11 ENCOUNTER — Telehealth: Payer: Self-pay | Admitting: Oncology

## 2016-09-11 VITALS — BP 111/63 | HR 53 | Temp 98.5°F | Resp 17 | Ht 71.0 in | Wt 182.5 lb

## 2016-09-11 DIAGNOSIS — Z8546 Personal history of malignant neoplasm of prostate: Secondary | ICD-10-CM | POA: Diagnosis not present

## 2016-09-11 DIAGNOSIS — N393 Stress incontinence (female) (male): Secondary | ICD-10-CM | POA: Diagnosis not present

## 2016-09-11 DIAGNOSIS — M6281 Muscle weakness (generalized): Secondary | ICD-10-CM | POA: Diagnosis not present

## 2016-09-11 DIAGNOSIS — M62838 Other muscle spasm: Secondary | ICD-10-CM | POA: Diagnosis not present

## 2016-09-11 DIAGNOSIS — C61 Malignant neoplasm of prostate: Secondary | ICD-10-CM

## 2016-09-11 DIAGNOSIS — R151 Fecal smearing: Secondary | ICD-10-CM | POA: Diagnosis not present

## 2016-09-11 NOTE — Progress Notes (Signed)
Hematology and Oncology Follow Up Visit  AMDREW [name] [id number] 06-Oct-1945 71 y.o. 09/11/2016 9:48 AM    Principle Diagnosis: 71 year old gentleman diagnosed with prostate cancer in November of 2012 presented with a PSA of 4.38 and found to have a Gleason score 4+3 equals 7, his clinical staging was T2c N1. 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    Prior Therapy: He presented with a PSA of 4.38 and subsequently underwent a radical prostatectomy and lymph node dissection on November of 2012. 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  He did not have any evidence of angiolymphatic invasion or extraprostatic extension. The margins were negative. He had one out of 12 lymph nodes involved with cancer predominantly in the right pelvic wall.  His post operative PSA remained at 0.65 and subsequently to 0.89. And in March of 2013 he was started on Lupron and have been on it since that time. His PSA remains very low 0.01.  His PSA went up to 0.3 in March 2017. He completed salvage radiation therapy completed in June 2017 under the care of Dr. Tammi Klippel. He received planned dose of 68.4 grays.  Current therapy: Lupron given every 6 months under the care of Dr. Alinda Money. This has been given intermittently he is off treatment at this time.  Interim History:  [name] presents today for a follow-up visit. Since the last visit, he reports increase in incontinence complaints. He reports the symptoms have gotten worse in the last few months and have resumed physical therapy which have helped his symptoms temporarily. Reported in the last few weeks that his symptoms have gotten worse despite physical therapy. He denied any abdominal pain, hematuria or changes bowel habits.He remains active and attends activities of daily living. He denied any testicular pain.   He does not report any headaches,  blurry vision, syncope or seizures. He has not reported any weight loss or appetite changes. Did not report any constitutional symptoms. Does not report any abdominal pain or satiety. Did not report any back pain shoulder pain or any bone discomfort. Has not reported any hematochezia or melena. He does not report any headaches or blurry vision or syncope. He does not report any lymphadenopathy or petechiae. The remaining review of systems is unremarkable.   Medications: I have reviewed the patient's current medications.  Current Outpatient Prescriptions  Medication Sig Dispense Refill  . ascorbic acid (VITAMIN C) 1000 MG tablet Take 1,000 mg by mouth 3 (three) times a week.    Marland Kitchen aspirin EC 81 MG tablet Take 81 mg by mouth daily.    . Calcium Carbonate-Vitamin D (CALCIUM 600+D3 PO) Take 1 tablet by mouth daily. Patient takes 1-2 tablets daily    . lisinopril-hydrochlorothiazide (PRINZIDE,ZESTORETIC) 20-12.5 MG tablet Take 1 tablet by mouth daily.  1  . MULTIPLE VITAMIN PO Take 1 tablet by mouth daily.    . propranolol (INDERAL) 40 MG tablet Take 1 tablet (40 mg total) by mouth 2 (two) times daily. 180 tablet 4  . simvastatin (ZOCOR) 20 MG tablet Take 20 mg by mouth at bedtime.     Marland Kitchen zolpidem (AMBIEN CR) 6.25 MG CR tablet Take 6.25 mg by mouth at bedtime as needed for sleep.     No current facility-administered medications for this visit.      Allergies: No Known Allergies  Past Medical History, Surgical history, Social history, and Family History were reviewed and updated.  Physical Exam: Blood pressure  111/63, pulse (!) 53, temperature 98.5 F (36.9 C), temperature source Oral, resp. rate 17, height 5\' 11"  (1.803 m), weight 182 lb 8 oz (82.8 kg), SpO2 99 %. ECOG: 0 General appearance: Alert, awake gentleman without distress. Head: Normocephalic, without obvious abnormality no oral ulcers or thrush. Neck: no adenopathy Lymph nodes: Cervical, supraclavicular, and axillary nodes  normal. Heart:regular rate and rhythm, S1, S2 normal, no murmur, click, rub or gallop Lung:chest clear, no wheezing, rales, normal symmetric air entry no dullness to percussion. Abdomin: soft, non-tender, without masses or organomegaly no shifting dullness or ascites. EXT:no erythema, induration, or nodules   Lab Results: Lab Results  Component Value Date   WBC 5.2 09/09/2016   HGB 13.1 09/09/2016   HCT 38.5 09/09/2016   MCV 89.7 09/09/2016   PLT 184 09/09/2016     Chemistry      Component Value Date/Time   NA 141 09/09/2016 1438   K 3.9 09/09/2016 1438   CL 101 01/14/2011 0517   CO2 24 09/09/2016 1438   BUN 25.6 09/09/2016 1438   CREATININE 1.0 09/09/2016 1438      Component Value Date/Time   CALCIUM 9.8 09/09/2016 1438   ALKPHOS 53 09/09/2016 1438   AST 21 09/09/2016 1438   ALT 18 09/09/2016 1438   BILITOT 0.39 09/09/2016 1438     Results for [name], [name] (MRN [id number]) as of 09/11/2016 09:37  Ref. Range 04/30/2016 08:36 09/09/2016 14:38  Prostate Specific Ag, Serum Latest Ref Range: 0.0 - 4.0 ng/mL <0.1 <0.1    Impression and Plan:  71 year old gentleman with the following issues:  1. Prostate cancer diagnosed in November of 2012 presented with a PSA of 4.38 and found to have a Gleason score 4+3 equals 7, his clinical staging was T2c N1. He is status post robotic prostatectomy and lymphadenectomy with one of 12 lymph nodes were involved. He continued to have elevated PSA that is detectable around 0.68 and 0.84 and had been on Lupron since March of 2013.  PSA on 05/09/2015 was up to 0.29. His CT scan chest abdomen and pelvis on 05/09/2015 showed no evidence of measurable disease.  He is status post radiation therapy for salvage purposes and a follow-up PSA was 0.1 in July 2017. He has received intermittent androgen deprivation and he is currently off therapy.  CT scan In March 2018 showed no evidence of measurable disease. His PSA on 09/09/2016 continues to be  undetectable.  The plan is to continue with active surveillance at this time and repeat imaging studies including a CT scan and PSA in November 2018.  2. Androgen depravation: Prescribed by Dr. Alinda Money intermittently. He is off treatment at this time.  3. Bone health: I agree with calcium and vitamin D and repeat bone density frequently.  4. Incontinence: Related to his previous surgery and radiation. I encouraged him to follow-up with Dr. Alinda Money regarding this issue. I doubt that this is a sign of cancer recurrence at this time.  5. Follow-up: Will be in 4 months for a repeat laboratory data and repeat CT scan at that time.  [facility], MD 7/19/20189:48 AM

## 2016-09-11 NOTE — Telephone Encounter (Signed)
Scheduled appt per 7/19 los - Gave patent AVS and calender per los. [facility] to contact patient with ct schedule.

## 2016-09-18 DIAGNOSIS — N3946 Mixed incontinence: Secondary | ICD-10-CM | POA: Diagnosis not present

## 2016-09-18 DIAGNOSIS — N3942 Incontinence without sensory awareness: Secondary | ICD-10-CM | POA: Diagnosis not present

## 2016-09-18 DIAGNOSIS — R350 Frequency of micturition: Secondary | ICD-10-CM | POA: Diagnosis not present

## 2016-09-18 DIAGNOSIS — R351 Nocturia: Secondary | ICD-10-CM | POA: Diagnosis not present

## 2016-10-09 DIAGNOSIS — R351 Nocturia: Secondary | ICD-10-CM | POA: Diagnosis not present

## 2016-10-09 DIAGNOSIS — N3946 Mixed incontinence: Secondary | ICD-10-CM | POA: Diagnosis not present

## 2016-10-09 DIAGNOSIS — N3942 Incontinence without sensory awareness: Secondary | ICD-10-CM | POA: Diagnosis not present

## 2016-10-09 DIAGNOSIS — C61 Malignant neoplasm of prostate: Secondary | ICD-10-CM | POA: Diagnosis not present

## 2016-10-14 DIAGNOSIS — N3946 Mixed incontinence: Secondary | ICD-10-CM | POA: Diagnosis not present

## 2016-10-14 DIAGNOSIS — R351 Nocturia: Secondary | ICD-10-CM | POA: Diagnosis not present

## 2016-10-24 DIAGNOSIS — Z23 Encounter for immunization: Secondary | ICD-10-CM | POA: Diagnosis not present

## 2016-10-24 DIAGNOSIS — Z1389 Encounter for screening for other disorder: Secondary | ICD-10-CM | POA: Diagnosis not present

## 2016-10-24 DIAGNOSIS — S39012A Strain of muscle, fascia and tendon of lower back, initial encounter: Secondary | ICD-10-CM | POA: Diagnosis not present

## 2016-11-26 ENCOUNTER — Telehealth: Payer: Self-pay | Admitting: Oncology

## 2016-11-26 NOTE — Telephone Encounter (Signed)
Patient called in to reschedule lab

## 2016-12-10 DIAGNOSIS — N3942 Incontinence without sensory awareness: Secondary | ICD-10-CM | POA: Diagnosis not present

## 2016-12-10 DIAGNOSIS — R350 Frequency of micturition: Secondary | ICD-10-CM | POA: Diagnosis not present

## 2016-12-10 DIAGNOSIS — N3946 Mixed incontinence: Secondary | ICD-10-CM | POA: Diagnosis not present

## 2016-12-22 DIAGNOSIS — L570 Actinic keratosis: Secondary | ICD-10-CM | POA: Diagnosis not present

## 2016-12-22 DIAGNOSIS — Z85828 Personal history of other malignant neoplasm of skin: Secondary | ICD-10-CM | POA: Diagnosis not present

## 2016-12-22 DIAGNOSIS — L821 Other seborrheic keratosis: Secondary | ICD-10-CM | POA: Diagnosis not present

## 2016-12-22 DIAGNOSIS — L812 Freckles: Secondary | ICD-10-CM | POA: Diagnosis not present

## 2016-12-22 DIAGNOSIS — D1801 Hemangioma of skin and subcutaneous tissue: Secondary | ICD-10-CM | POA: Diagnosis not present

## 2016-12-22 DIAGNOSIS — Z85820 Personal history of malignant melanoma of skin: Secondary | ICD-10-CM | POA: Diagnosis not present

## 2017-01-02 ENCOUNTER — Other Ambulatory Visit: Payer: Self-pay | Admitting: *Deleted

## 2017-01-06 ENCOUNTER — Other Ambulatory Visit (HOSPITAL_BASED_OUTPATIENT_CLINIC_OR_DEPARTMENT_OTHER): Payer: Medicare Other

## 2017-01-06 ENCOUNTER — Other Ambulatory Visit: Payer: Medicare Other

## 2017-01-06 ENCOUNTER — Ambulatory Visit (HOSPITAL_COMMUNITY)
Admission: RE | Admit: 2017-01-06 | Discharge: 2017-01-06 | Disposition: A | Discharge status: Home / Self Care | Payer: Medicare Other | Source: Ambulatory Visit | Attending: Oncology | Admitting: Oncology

## 2017-01-06 DIAGNOSIS — G4700 Insomnia, unspecified: Secondary | ICD-10-CM | POA: Diagnosis not present

## 2017-01-06 DIAGNOSIS — C61 Malignant neoplasm of prostate: Secondary | ICD-10-CM | POA: Insufficient documentation

## 2017-01-06 DIAGNOSIS — Z0000 Encounter for general adult medical examination without abnormal findings: Secondary | ICD-10-CM | POA: Diagnosis not present

## 2017-01-06 DIAGNOSIS — I712 Thoracic aortic aneurysm, without rupture: Secondary | ICD-10-CM | POA: Diagnosis not present

## 2017-01-06 DIAGNOSIS — E7800 Pure hypercholesterolemia, unspecified: Secondary | ICD-10-CM | POA: Diagnosis not present

## 2017-01-06 DIAGNOSIS — Z1211 Encounter for screening for malignant neoplasm of colon: Secondary | ICD-10-CM | POA: Diagnosis not present

## 2017-01-06 DIAGNOSIS — G250 Essential tremor: Secondary | ICD-10-CM | POA: Diagnosis not present

## 2017-01-06 DIAGNOSIS — K5641 Fecal impaction: Secondary | ICD-10-CM | POA: Insufficient documentation

## 2017-01-06 DIAGNOSIS — R32 Unspecified urinary incontinence: Secondary | ICD-10-CM | POA: Diagnosis not present

## 2017-01-06 DIAGNOSIS — M5137 Other intervertebral disc degeneration, lumbosacral region: Secondary | ICD-10-CM | POA: Diagnosis not present

## 2017-01-06 DIAGNOSIS — Z1389 Encounter for screening for other disorder: Secondary | ICD-10-CM | POA: Diagnosis not present

## 2017-01-06 DIAGNOSIS — I10 Essential (primary) hypertension: Secondary | ICD-10-CM | POA: Diagnosis not present

## 2017-01-06 DIAGNOSIS — I700 Atherosclerosis of aorta: Secondary | ICD-10-CM | POA: Diagnosis not present

## 2017-01-06 DIAGNOSIS — R918 Other nonspecific abnormal finding of lung field: Secondary | ICD-10-CM | POA: Insufficient documentation

## 2017-01-06 DIAGNOSIS — M47816 Spondylosis without myelopathy or radiculopathy, lumbar region: Secondary | ICD-10-CM | POA: Insufficient documentation

## 2017-01-06 DIAGNOSIS — M25552 Pain in left hip: Secondary | ICD-10-CM | POA: Diagnosis not present

## 2017-01-06 DIAGNOSIS — D696 Thrombocytopenia, unspecified: Secondary | ICD-10-CM | POA: Diagnosis not present

## 2017-01-06 LAB — COMPREHENSIVE METABOLIC PANEL
ALT: 16 U/L (ref 0–55)
AST: 19 U/L (ref 5–34)
Albumin: 4 g/dL (ref 3.5–5.0)
Alkaline Phosphatase: 41 U/L (ref 40–150)
Anion Gap: 6 mEq/L (ref 3–11)
BUN: 28.9 mg/dL — ABNORMAL HIGH (ref 7.0–26.0)
CO2: 26 mEq/L (ref 22–29)
Calcium: 9.7 mg/dL (ref 8.4–10.4)
Chloride: 109 mEq/L (ref 98–109)
Creatinine: 1.1 mg/dL (ref 0.7–1.3)
EGFR: >60 mL/min/{1.73_m2} (ref >60)
Glucose: 97 mg/dl (ref 70–140)
Potassium: 4.2 mEq/L (ref 3.5–5.1)
Sodium: 141 mEq/L (ref 136–145)
Total Bilirubin: 0.43 mg/dL (ref 0.20–1.20)
Total Protein: 7.1 g/dL (ref 6.4–8.3)

## 2017-01-06 LAB — CBC WITH DIFFERENTIAL/PLATELET
BASO%: 1.5 % (ref 0.0–2.0)
Basophils Absolute: 0.1 10*3/uL (ref 0.0–0.1)
EOS%: 5.3 % (ref 0.0–7.0)
Eosinophils Absolute: 0.2 10*3/uL (ref 0.0–0.5)
HCT: 38.4 % (ref 38.4–49.9)
HGB: 12.9 g/dL — ABNORMAL LOW (ref 13.0–17.1)
LYMPH%: 19.7 % (ref 14.0–49.0)
MCH: 30.4 pg (ref 27.2–33.4)
MCHC: 33.5 g/dL (ref 32.0–36.0)
MCV: 90.7 fL (ref 79.3–98.0)
MONO#: 0.4 10*3/uL (ref 0.1–0.9)
MONO%: 10.4 % (ref 0.0–14.0)
NEUT#: 2.7 10*3/uL (ref 1.5–6.5)
NEUT%: 63.1 % (ref 39.0–75.0)
Platelets: 171 10*3/uL (ref 140–400)
RBC: 4.24 10*6/uL (ref 4.20–5.82)
RDW: 13.3 % (ref 11.0–14.6)
WBC: 4.2 10*3/uL (ref 4.0–10.3)
lymph#: 0.8 10*3/uL — ABNORMAL LOW (ref 0.9–3.3)

## 2017-01-06 MED ORDER — IOPAMIDOL (ISOVUE-300) INJECTION 61%
INTRAVENOUS | Status: AC
  Administered 2017-01-06: 100 mL
  Filled 2017-01-06: qty 100

## 2017-01-07 LAB — PSA: Prostate Specific Ag, Serum: <0.1 ng/mL (ref 0.0–4.0)

## 2017-01-08 ENCOUNTER — Ambulatory Visit (HOSPITAL_BASED_OUTPATIENT_CLINIC_OR_DEPARTMENT_OTHER): Payer: Medicare Other | Admitting: Oncology

## 2017-01-08 ENCOUNTER — Telehealth: Payer: Self-pay | Admitting: Oncology

## 2017-01-08 VITALS — BP 147/72 | HR 56 | Temp 97.9°F | Resp 17 | Ht 71.0 in | Wt 184.4 lb

## 2017-01-08 DIAGNOSIS — Z8546 Personal history of malignant neoplasm of prostate: Secondary | ICD-10-CM | POA: Diagnosis not present

## 2017-01-08 DIAGNOSIS — C61 Malignant neoplasm of prostate: Secondary | ICD-10-CM

## 2017-01-08 DIAGNOSIS — M5126 Other intervertebral disc displacement, lumbar region: Secondary | ICD-10-CM | POA: Diagnosis not present

## 2017-01-08 NOTE — Telephone Encounter (Signed)
Gave avs and calendar for august 2019

## 2017-01-08 NOTE — Progress Notes (Signed)
Hematology and Oncology Follow Up Visit  David [name] [id number] 05-Jul-1945 71 y.o. 01/08/2017 9:16 AM    Principle Diagnosis: 71 year old gentleman diagnosed with prostate cancer in November of 2012 presented with a PSA of 4.38 and found to have a Gleason score 4+3 equals 7, his clinical staging was T2c N1. 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    Prior Therapy: He presented with a PSA of 4.38 and subsequently underwent a radical prostatectomy and lymph node dissection on November of 2012. 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  He did not have any evidence of angiolymphatic invasion or extraprostatic extension. The margins were negative. He had one out of 12 lymph nodes involved with cancer predominantly in the right pelvic wall.  His post operative PSA remained at 0.65 and subsequently to 0.89. And in March of 2013 he was started on Lupron and have been on it since that time. His PSA remains very low 0.01.  His PSA went up to 0.3 in March 2017. He completed salvage radiation therapy completed in June 2017 under the care of Dr. Tammi Klippel. He received planned dose of 68.4 grays.  Current therapy: Lupron given every 6 months under the care of Dr. Alinda Money. This has been given intermittently he is off treatment at this time.  Interim History:  David [name] presents today for a follow-up visit. Since the last visit, he reports no changes in his health.  He continues to report incontinence issues managed by urology. He denied any abdominal pain, hematuria or changes bowel habits.He remains active and attends activities of daily living. He denied any testicular pain.  He continues to have excellent quality of life without any major changes.  He does have chronic hip pain and is being evaluated by orthopedics.   He does not report any headaches, blurry vision, syncope or seizures. He  has not reported any weight loss or appetite changes. Did not report any constitutional symptoms. Does not report any abdominal pain or satiety. Did not report any back pain shoulder pain or any bone discomfort. Has not reported any hematochezia or melena. He does not report any headaches or blurry vision or syncope. He does not report any lymphadenopathy or petechiae. The remaining review of systems is unremarkable.   Medications: I have reviewed the patient's current medications.  Current Outpatient Medications  Medication Sig Dispense Refill  . ascorbic acid (VITAMIN C) 1000 MG tablet Take 1,000 mg by mouth 3 (three) times a week.    Marland Kitchen aspirin EC 81 MG tablet Take 81 mg by mouth daily.    . Calcium Carbonate-Vitamin D (CALCIUM 600+D3 PO) Take 1 tablet by mouth daily. Patient takes 1-2 tablets daily    . lisinopril-hydrochlorothiazide (PRINZIDE,ZESTORETIC) 20-12.5 MG tablet Take 1 tablet by mouth daily.  1  . MULTIPLE VITAMIN PO Take 1 tablet by mouth daily.    . propranolol (INDERAL) 40 MG tablet Take 1 tablet (40 mg total) by mouth 2 (two) times daily. 180 tablet 4  . simvastatin (ZOCOR) 20 MG tablet Take 20 mg by mouth at bedtime.     Marland Kitchen zolpidem (AMBIEN CR) 6.25 MG CR tablet Take 6.25 mg by mouth at bedtime as needed for sleep.     No current facility-administered medications for this visit.      Allergies: No Known Allergies  Past Medical History, Surgical history, Social history, and Family History were reviewed and updated.  Physical Exam: Blood pressure Marland Kitchen)  147/72, pulse (!) 56, temperature 97.9 F (36.6 C), temperature source Oral, resp. rate 17, height 5\' 11"  (1.803 m), weight 184 lb 6.4 oz (83.6 kg), SpO2 100 %. ECOG: 0 General appearance: Well-appearing gentleman without distress. Head: Normocephalic, without obvious abnormality no oral ulcers or lesions. Neck: no adenopathy Lymph nodes: Cervical, supraclavicular, and axillary nodes normal. Heart:regular rate and rhythm,  S1, S2 normal, no murmur, click, rub or gallop Lung:chest clear, no wheezing, rales, normal symmetric air entry no dullness to percussion. Abdomin: soft, non-tender, without masses or organomegaly no rebound or guarding. EXT:no erythema, induration, or nodules   Lab Results: Lab Results  Component Value Date   WBC 4.2 01/06/2017   HGB 12.9 ([name]) 01/06/2017   HCT 38.4 01/06/2017   MCV 90.7 01/06/2017   PLT 171 01/06/2017     Chemistry      Component Value Date/Time   NA 141 01/06/2017 0820   K 4.2 01/06/2017 0820   CL 101 01/14/2011 0517   CO2 26 01/06/2017 0820   BUN 28.9 (H) 01/06/2017 0820   CREATININE 1.1 01/06/2017 0820      Component Value Date/Time   CALCIUM 9.7 01/06/2017 0820   ALKPHOS 41 01/06/2017 0820   AST 19 01/06/2017 0820   ALT 16 01/06/2017 0820   BILITOT 0.43 01/06/2017 0820     Results for David [name], David [name] (MRN [id number]) as of 01/08/2017 09:17  Ref. Range 09/09/2016 14:38 01/06/2017 08:19  Prostate Specific Ag, Serum Latest Ref Range: 0.0 - 4.0 ng/mL <0.1 <0.1   EXAM: CT CHEST, ABDOMEN, AND PELVIS WITH CONTRAST  TECHNIQUE: Multidetector CT imaging of the chest, abdomen and pelvis was performed following the standard protocol during bolus administration of intravenous contrast.  CONTRAST:  193mL ISOVUE-300 IOPAMIDOL (ISOVUE-300) INJECTION 61%  COMPARISON:  Multiple exams, including 04/30/2016  FINDINGS: CT CHEST FINDINGS  Cardiovascular: Mild atherosclerotic calcification of the aortic arch. Ascending thoracic aorta 4.1 cm in diameter on image 35/2. Borderline cardiomegaly.  Mediastinum/Nodes: Small bilateral hilar lymph nodes with punctate calcifications. No pathologic thoracic adenopathy.  Lungs/Pleura: Biapical pleuroparenchymal scarring. Several small right pulmonary nodules including the 4 by 3 mm right lower lobe nodule on image 65/6 are stable compared back through 09/05/2013 and thought to be benign. No new or enlarging  pulmonary nodules since that time.  Musculoskeletal: Degenerative sternoclavicular arthropathy, right greater than left. Thoracic spondylosis.  CT ABDOMEN PELVIS FINDINGS  Hepatobiliary: Several tiny hypodense lesions in the liver are stable and highly likely to be benign, although technically too small to characterize. No new or worrisome lesions identified.  Pancreas: Unremarkable  Spleen: Unremarkable  Adrenals/Urinary Tract: Several small bilateral renal cysts are present. Adrenal glands normal. Otherwise unremarkable.  Stomach/Bowel: Prominent stool throughout the colon favors constipation.  Vascular/Lymphatic: Aortoiliac atherosclerotic vascular disease. No pathologic adenopathy.  Reproductive: Prostatectomy. As before there is slight retraction of the right testicle into the upper scrotum along the spermatic cord.  Other: No supplemental non-categorized findings.  Musculoskeletal: Mild bridging spurring of the right sacroiliac joint. No findings of osseous metastatic disease. Moderate degenerative arthropathy of both hips. Small indirect bilateral inguinal hernias contain adipose tissue.  Lumbar spondylosis and degenerative disc disease causing left foraminal impingement at L5-S1, and mild displacement of the left L3 nerve in the lateral extraforaminal space at the L3-4 level.  IMPRESSION: 1. No adenopathy, osseous metastatic disease, or other findings of active prostate cancer. 2. Ascending thoracic aortic aneurysm at 4.1 cm. Recommend annual imaging followup by CTA or MRA. This recommendation follows  2010 ACCF/AHA/AATS/ACR/ASA/SCA/SCAI/SIR/STS/SVM Guidelines for the Diagnosis and Management of Patients with Thoracic Aortic Disease. Circulation. 2010; 121: T254-D826 3.  Aortic Atherosclerosis (ICD10-I70.0). 4. Several small right pulmonary nodules are stable back through 2015 and thought to be benign. 5.  Prominent stool throughout the colon  favors constipation. 6. Lumbar spondylosis and degenerative disc disease causing impingement at L5-S1 and potentially at L3-4.   Impression and Plan:  71 year old gentleman with the following issues:  1. Prostate cancer diagnosed in November of 2012 presented with a PSA of 4.38 and found to have a Gleason score 4+3 equals 7, his clinical staging was T2c N1. He is status post robotic prostatectomy and lymphadenectomy with one of 12 lymph nodes were involved. He continued to have elevated PSA that is detectable around 0.68 and 0.84 and had been on Lupron since March of 2013.  PSA on 05/09/2015 was up to 0.29. His CT scan chest abdomen and pelvis on 05/09/2015 showed no evidence of measurable disease.  He is status post radiation therapy for salvage purposes and a follow-up PSA was 0.1 in July 2017. He has received intermittent androgen deprivation and he is currently off therapy.  CT scan laboratory data from 01/06/2017 were reviewed today showed no evidence of recurrent disease.  Plan is to continue with active surveillance at this time without any additional systemic therapy.  2. Androgen depravation: Prescribed by Dr. Alinda Money intermittently. He is off treatment at this time.  This can be restarted in the future if his PSA starts to rise.  3. Bone health: I agree with calcium and vitamin D and repeat bone density frequently.  4. Incontinence: Related to his previous surgery and radiation.  He continues to follow with Dr. Alinda Money regarding this issue.  5. Follow-up: Will be in 8 months for a repeat laboratory data and repeat CT scan at that time.  Zola Button, MD 11/15/20189:16 AM

## 2017-01-20 ENCOUNTER — Other Ambulatory Visit: Payer: Self-pay | Admitting: *Deleted

## 2017-01-20 DIAGNOSIS — I10 Essential (primary) hypertension: Secondary | ICD-10-CM

## 2017-01-20 MED ORDER — PROPRANOLOL HCL 40 MG PO TABS: 40.0000 mg | ORAL_TABLET | Freq: Two times a day (BID) | ORAL | 0 refills | Status: DC

## 2017-01-30 DIAGNOSIS — M5126 Other intervertebral disc displacement, lumbar region: Secondary | ICD-10-CM | POA: Diagnosis not present

## 2017-01-30 DIAGNOSIS — M545 Low back pain: Secondary | ICD-10-CM | POA: Diagnosis not present

## 2017-02-20 DIAGNOSIS — C61 Malignant neoplasm of prostate: Secondary | ICD-10-CM | POA: Diagnosis not present

## 2017-02-23 DIAGNOSIS — R899 Unspecified abnormal finding in specimens from other organs, systems and tissues: Secondary | ICD-10-CM | POA: Diagnosis not present

## 2017-02-23 DIAGNOSIS — E7800 Pure hypercholesterolemia, unspecified: Secondary | ICD-10-CM | POA: Diagnosis not present

## 2017-02-25 DIAGNOSIS — N3946 Mixed incontinence: Secondary | ICD-10-CM | POA: Diagnosis not present

## 2017-02-25 DIAGNOSIS — C61 Malignant neoplasm of prostate: Secondary | ICD-10-CM | POA: Diagnosis not present

## 2017-03-09 DIAGNOSIS — N6320 Unspecified lump in the left breast, unspecified quadrant: Secondary | ICD-10-CM | POA: Diagnosis not present

## 2017-03-10 ENCOUNTER — Other Ambulatory Visit: Payer: Self-pay | Admitting: Family Medicine

## 2017-03-10 DIAGNOSIS — N6320 Unspecified lump in the left breast, unspecified quadrant: Secondary | ICD-10-CM

## 2017-03-16 ENCOUNTER — Ambulatory Visit: Payer: Medicare Other

## 2017-03-16 ENCOUNTER — Ambulatory Visit
Admission: RE | Admit: 2017-03-16 | Discharge: 2017-03-16 | Disposition: A | Discharge status: Home / Self Care | Payer: Medicare Other | Source: Ambulatory Visit | Attending: Family Medicine | Admitting: Family Medicine

## 2017-03-16 DIAGNOSIS — N6320 Unspecified lump in the left breast, unspecified quadrant: Secondary | ICD-10-CM

## 2017-03-16 DIAGNOSIS — R928 Other abnormal and inconclusive findings on diagnostic imaging of breast: Secondary | ICD-10-CM | POA: Diagnosis not present

## 2017-03-30 DIAGNOSIS — M5126 Other intervertebral disc displacement, lumbar region: Secondary | ICD-10-CM | POA: Diagnosis not present

## 2017-04-03 DIAGNOSIS — M25551 Pain in right hip: Secondary | ICD-10-CM | POA: Diagnosis not present

## 2017-04-23 DIAGNOSIS — M25552 Pain in left hip: Secondary | ICD-10-CM | POA: Diagnosis not present

## 2017-04-28 DIAGNOSIS — M25552 Pain in left hip: Secondary | ICD-10-CM | POA: Diagnosis not present

## 2017-04-30 DIAGNOSIS — M25552 Pain in left hip: Secondary | ICD-10-CM | POA: Diagnosis not present

## 2017-05-01 DIAGNOSIS — M25559 Pain in unspecified hip: Secondary | ICD-10-CM | POA: Insufficient documentation

## 2017-05-01 DIAGNOSIS — M5416 Radiculopathy, lumbar region: Secondary | ICD-10-CM | POA: Diagnosis not present

## 2017-05-01 DIAGNOSIS — M25552 Pain in left hip: Secondary | ICD-10-CM | POA: Diagnosis not present

## 2017-05-01 DIAGNOSIS — M48061 Spinal stenosis, lumbar region without neurogenic claudication: Secondary | ICD-10-CM | POA: Insufficient documentation

## 2017-05-01 DIAGNOSIS — M5126 Other intervertebral disc displacement, lumbar region: Secondary | ICD-10-CM | POA: Diagnosis not present

## 2017-05-01 DIAGNOSIS — M545 Low back pain: Secondary | ICD-10-CM | POA: Diagnosis not present

## 2017-05-07 ENCOUNTER — Other Ambulatory Visit: Payer: Self-pay | Admitting: Neurosurgery

## 2017-05-07 DIAGNOSIS — M5416 Radiculopathy, lumbar region: Secondary | ICD-10-CM

## 2017-05-14 DIAGNOSIS — H5201 Hypermetropia, right eye: Secondary | ICD-10-CM | POA: Diagnosis not present

## 2017-05-14 DIAGNOSIS — I10 Essential (primary) hypertension: Secondary | ICD-10-CM | POA: Diagnosis not present

## 2017-05-14 DIAGNOSIS — H524 Presbyopia: Secondary | ICD-10-CM | POA: Diagnosis not present

## 2017-05-14 DIAGNOSIS — Z961 Presence of intraocular lens: Secondary | ICD-10-CM | POA: Diagnosis not present

## 2017-05-15 ENCOUNTER — Ambulatory Visit
Admission: RE | Admit: 2017-05-15 | Discharge: 2017-05-15 | Disposition: A | Discharge status: Home / Self Care | Payer: Medicare Other | Source: Ambulatory Visit | Attending: Neurosurgery | Admitting: Neurosurgery

## 2017-05-15 DIAGNOSIS — M5126 Other intervertebral disc displacement, lumbar region: Secondary | ICD-10-CM | POA: Diagnosis not present

## 2017-05-15 DIAGNOSIS — M48061 Spinal stenosis, lumbar region without neurogenic claudication: Secondary | ICD-10-CM | POA: Diagnosis not present

## 2017-05-15 DIAGNOSIS — M5416 Radiculopathy, lumbar region: Secondary | ICD-10-CM

## 2017-05-15 DIAGNOSIS — M545 Low back pain: Secondary | ICD-10-CM | POA: Diagnosis not present

## 2017-05-15 DIAGNOSIS — Z6825 Body mass index (BMI) 25.0-25.9, adult: Secondary | ICD-10-CM | POA: Diagnosis not present

## 2017-05-21 ENCOUNTER — Encounter: Payer: Self-pay | Admitting: Internal Medicine

## 2017-05-21 ENCOUNTER — Ambulatory Visit (INDEPENDENT_AMBULATORY_CARE_PROVIDER_SITE_OTHER): Payer: Medicare Other | Admitting: Internal Medicine

## 2017-05-21 ENCOUNTER — Other Ambulatory Visit: Payer: Medicare Other

## 2017-05-21 VITALS — BP 120/78 | HR 60 | Ht 71.0 in | Wt 180.8 lb

## 2017-05-21 DIAGNOSIS — E785 Hyperlipidemia, unspecified: Secondary | ICD-10-CM | POA: Diagnosis not present

## 2017-05-21 DIAGNOSIS — I10 Essential (primary) hypertension: Secondary | ICD-10-CM

## 2017-05-21 DIAGNOSIS — Q254 Congenital malformation of aorta unspecified: Secondary | ICD-10-CM | POA: Diagnosis not present

## 2017-05-21 LAB — LIPID PANEL
Chol/HDL Ratio: 3.1 ratio (ref 0.0–5.0)
Cholesterol, Total: 134 mg/dL (ref 100–199)
HDL: 43 mg/dL (ref >39)
LDL Calculated: 67 mg/dL (ref 0–99)
Triglycerides: 119 mg/dL (ref 0–149)
VLDL Cholesterol Cal: 24 mg/dL (ref 5–40)

## 2017-05-21 NOTE — Progress Notes (Signed)
Cardiology Office Note   Date:  05/21/2017   ID:  [name], [name] 1945/06/07, MRN [id number]  PCP:  Antony Contras, MD  Cardiologist:   Dorris Carnes, MD   F/U of CP, HL      History of Present Illness: [name] is a 72 y.o. male with a history of CP and HL   Pt has mild arterial calcificatoins of CT   I saw him in march 2018  Since seen has had some back and hip pains  This has limited  His activity  Pt denies CP   Breathing is OK    Pt had CT scan in Nove 2018  This showed mildl atherosclerosis of the aorta  Current Meds  Medication Sig  . Ascorbic Acid (VITAMIN C PO) Take 1 tablet by mouth daily.  Marland Kitchen ascorbic acid (VITAMIN C) 1000 MG tablet Take 1,000 mg by mouth 3 (three) times a week.  Marland Kitchen aspirin EC 81 MG tablet Take 81 mg by mouth daily.  . Calcium Carbonate-Vitamin D (CALCIUM 600+D3 PO) Take 1 tablet by mouth daily. Patient takes 1-2 tablets daily  . gabapentin (NEURONTIN) 300 MG capsule Take 300 mg by mouth as needed (pain).  Marland Kitchen lisinopril-hydrochlorothiazide (PRINZIDE,ZESTORETIC) 20-12.5 MG tablet Take 1 tablet by mouth daily.  . MULTIPLE VITAMIN PO Take 1 tablet by mouth daily.  . naproxen (NAPROSYN) 500 MG tablet Take 500 mg by mouth as needed for mild pain.  Marland Kitchen propranolol (INDERAL) 40 MG tablet Take 1 tablet (40 mg total) by mouth 2 (two) times daily. Please call [phone] to continue refills or PCP my provide refills.  . simvastatin (ZOCOR) 20 MG tablet Take 20 mg by mouth at bedtime.   Marland Kitchen zolpidem (AMBIEN CR) 6.25 MG CR tablet Take 6.25 mg by mouth at bedtime as needed for sleep.     Allergies:   Patient has no known allergies.   Past Medical History:  Diagnosis Date  . Arthritis    hands   . Cancer [facility])    prostate cancer   . Hypertension   . Prostate cancer ([place])   . Recurrent upper respiratory infection (URI)    pt had a cold recent- week ago - better now   . Tremor     Past Surgical History:  Procedure Laterality Date  . CATARACT EXTRACTION  Bilateral   . OTHER SURGICAL HISTORY  2005   right hand middle finger surgery   . ROBOT ASSISTED LAPAROSCOPIC RADICAL PROSTATECTOMY  01/13/2011   Procedure: ROBOTIC ASSISTED LAPAROSCOPIC RADICAL PROSTATECTOMY LEVEL 2;  Surgeon: Dutch Gray, MD;  Location: [facility];  Service: Urology;  Laterality: Bilateral;  Robotic Assisted Laparoscopic Prostatetectomy with Bilateral Lymphadenectomy  Level 2     Social History:  The patient  reports that he has never smoked. He has never used smokeless tobacco. He reports that he drinks alcohol. He reports that he does not use drugs.   Family History:  The patient's family history includes Cancer in his mother; Heart attack in his father; Hypertension in his father and sister.    ROS:  Please see the history of present illness. All other systems are reviewed and  Negative to the above problem except as noted.    PHYSICAL EXAM: VS:  BP 120/78   Pulse 60   Ht 5\' 11"  (1.803 m)   Wt 180 lb 12.8 oz (82 kg)   BMI 25.22 kg/m   GEN: Well nourished, well developed, in no acute distress  HEENT:  normal  Neck: JVP normal  NO, carotid bruits, or masses Cardiac: RRR; no murmurs, rubs, or gallops,no edema  Respiratory:  clear to auscultation bilaterally, normal work of breathing GI: soft, nontender, nondistended, + BS  No hepatomegaly  MS: no deformity Moving all extremities   Skin: warm and dry, no rash Neuro:  Strength and sensation are intact  Tremor present in hands   Psych: euthymic mood, full affect   EKG:  EKG is ordered today.  SR 60 bpm      Lipid Panel    Component Value Date/Time   CHOL 129 05/19/2016 1116   TRIG 136 05/19/2016 1116   HDL 38 (L) 05/19/2016 1116   CHOLHDL 3.4 05/19/2016 1116   CHOLHDL 2.9 05/25/2015 0857   VLDL 15 05/25/2015 0857   LDLCALC 64 05/19/2016 1116      Wt Readings from Last 3 Encounters:  05/21/17 180 lb 12.8 oz (82 kg)  01/08/17 184 lb 6.4 oz (83.6 kg)  09/11/16 182 lb 8 oz (82.8 kg)      ASSESSMENT AND  PLAN:  1  Atherosclerosis   Seen on CT   Thoracic aorta 41 mm   This will need to be followed  The pt is followed closely with scans through oncology clinc      Off of ASA    2  HTN  BP is good  Continue meds    3  HL Check lipid tdoay  On zocor  4  Tremor  Rest   Pt takes  Propranolol daily  Follows in neuro  He hs noticed some increase  Encouraged hiim to discuss with other providers    5  Back pain  Follows with [facility]    Encouraged him to stay active   Walking does not appear to cause his back to hurt more  F/U in 1 year   Current medicines are reviewed at length with the patient today.  The patient does not have concerns regarding medicines.  Signed, Dorris Carnes, MD  05/21/2017 9:29 AM    [address], [place], [place]  [address] Phone: [phone]; Fax: [phone]

## 2017-05-21 NOTE — Patient Instructions (Signed)
Your physician recommends that you continue on your current medications as directed.   Please refer to the Current Medication list given to you today.  Your physician recommends that you return for lab work today (lipids) Your physician wants you to follow-up in: 1 year with Dr. Ross.  You will receive a reminder letter in the mail two months in advance. If you don't receive a letter, please call our office to schedule the follow-up appointment.   

## 2017-06-01 DIAGNOSIS — D1801 Hemangioma of skin and subcutaneous tissue: Secondary | ICD-10-CM | POA: Diagnosis not present

## 2017-06-01 DIAGNOSIS — L812 Freckles: Secondary | ICD-10-CM | POA: Diagnosis not present

## 2017-06-01 DIAGNOSIS — L570 Actinic keratosis: Secondary | ICD-10-CM | POA: Diagnosis not present

## 2017-06-01 DIAGNOSIS — L821 Other seborrheic keratosis: Secondary | ICD-10-CM | POA: Diagnosis not present

## 2017-06-01 DIAGNOSIS — Z85828 Personal history of other malignant neoplasm of skin: Secondary | ICD-10-CM | POA: Diagnosis not present

## 2017-06-01 DIAGNOSIS — Z85820 Personal history of malignant melanoma of skin: Secondary | ICD-10-CM | POA: Diagnosis not present

## 2017-06-02 DIAGNOSIS — M5416 Radiculopathy, lumbar region: Secondary | ICD-10-CM | POA: Diagnosis not present

## 2017-06-04 ENCOUNTER — Other Ambulatory Visit: Payer: Self-pay | Admitting: *Deleted

## 2017-06-04 DIAGNOSIS — I10 Essential (primary) hypertension: Secondary | ICD-10-CM

## 2017-06-04 NOTE — Telephone Encounter (Signed)
Received fax from [facility] requesting that Dr Harrington Challenger refill the propranolol 40 mg bid prn. Upon reviewing chart it seems that patient is taking this medication for tremors. Last refill approved by neurology. Please advise. Thanks, MI

## 2017-06-04 NOTE — Telephone Encounter (Signed)
Should defer inderal refills to neuro.

## 2017-06-05 ENCOUNTER — Other Ambulatory Visit: Payer: Self-pay

## 2017-06-05 DIAGNOSIS — I10 Essential (primary) hypertension: Secondary | ICD-10-CM

## 2017-06-16 ENCOUNTER — Other Ambulatory Visit: Payer: Self-pay

## 2017-06-16 DIAGNOSIS — I10 Essential (primary) hypertension: Secondary | ICD-10-CM

## 2017-06-16 MED ORDER — PROPRANOLOL HCL 40 MG PO TABS: 40.0000 mg | ORAL_TABLET | Freq: Two times a day (BID) | ORAL | 3 refills | Status: DC

## 2017-06-30 DIAGNOSIS — M5416 Radiculopathy, lumbar region: Secondary | ICD-10-CM | POA: Diagnosis not present

## 2017-06-30 DIAGNOSIS — M5126 Other intervertebral disc displacement, lumbar region: Secondary | ICD-10-CM | POA: Diagnosis not present

## 2017-08-10 DIAGNOSIS — R5383 Other fatigue: Secondary | ICD-10-CM | POA: Diagnosis not present

## 2017-08-10 DIAGNOSIS — I959 Hypotension, unspecified: Secondary | ICD-10-CM | POA: Diagnosis not present

## 2017-08-10 DIAGNOSIS — R42 Dizziness and giddiness: Secondary | ICD-10-CM | POA: Diagnosis not present

## 2017-09-29 ENCOUNTER — Ambulatory Visit (HOSPITAL_COMMUNITY)
Admission: RE | Admit: 2017-09-29 | Discharge: 2017-09-29 | Disposition: A | Discharge status: Home / Self Care | Payer: Medicare Other | Source: Ambulatory Visit | Attending: Oncology | Admitting: Oncology

## 2017-09-29 ENCOUNTER — Inpatient Hospital Stay: Payer: Medicare Other | Attending: Oncology

## 2017-09-29 ENCOUNTER — Encounter (HOSPITAL_COMMUNITY): Payer: Self-pay

## 2017-09-29 DIAGNOSIS — R32 Unspecified urinary incontinence: Secondary | ICD-10-CM | POA: Insufficient documentation

## 2017-09-29 DIAGNOSIS — Z9079 Acquired absence of other genital organ(s): Secondary | ICD-10-CM | POA: Diagnosis not present

## 2017-09-29 DIAGNOSIS — I2510 Atherosclerotic heart disease of native coronary artery without angina pectoris: Secondary | ICD-10-CM | POA: Insufficient documentation

## 2017-09-29 DIAGNOSIS — I712 Thoracic aortic aneurysm, without rupture: Secondary | ICD-10-CM | POA: Insufficient documentation

## 2017-09-29 DIAGNOSIS — Z7982 Long term (current) use of aspirin: Secondary | ICD-10-CM | POA: Insufficient documentation

## 2017-09-29 DIAGNOSIS — M47816 Spondylosis without myelopathy or radiculopathy, lumbar region: Secondary | ICD-10-CM | POA: Insufficient documentation

## 2017-09-29 DIAGNOSIS — M5136 Other intervertebral disc degeneration, lumbar region: Secondary | ICD-10-CM | POA: Insufficient documentation

## 2017-09-29 DIAGNOSIS — Z79899 Other long term (current) drug therapy: Secondary | ICD-10-CM | POA: Insufficient documentation

## 2017-09-29 DIAGNOSIS — R918 Other nonspecific abnormal finding of lung field: Secondary | ICD-10-CM | POA: Diagnosis not present

## 2017-09-29 DIAGNOSIS — C61 Malignant neoplasm of prostate: Secondary | ICD-10-CM

## 2017-09-29 DIAGNOSIS — I700 Atherosclerosis of aorta: Secondary | ICD-10-CM | POA: Diagnosis not present

## 2017-09-29 DIAGNOSIS — Z8546 Personal history of malignant neoplasm of prostate: Secondary | ICD-10-CM | POA: Diagnosis not present

## 2017-09-29 DIAGNOSIS — Z923 Personal history of irradiation: Secondary | ICD-10-CM | POA: Diagnosis not present

## 2017-09-29 LAB — COMPREHENSIVE METABOLIC PANEL
ALT: 21 U/L (ref 0–44)
AST: 23 U/L (ref 15–41)
Albumin: 4.2 g/dL (ref 3.5–5.0)
Alkaline Phosphatase: 37 U/L — ABNORMAL LOW (ref 38–126)
Anion gap: 10 (ref 5–15)
BUN: 21 mg/dL (ref 8–23)
CO2: 25 mmol/L (ref 22–32)
Calcium: 9.7 mg/dL (ref 8.9–10.3)
Chloride: 102 mmol/L (ref 98–111)
Creatinine, Ser: 1.11 mg/dL (ref 0.61–1.24)
GFR calc Af Amer: >60 mL/min (ref >60)
GFR calc non Af Amer: >60 mL/min (ref >60)
Glucose, Bld: 100 mg/dL — ABNORMAL HIGH (ref 70–99)
Potassium: 4.2 mmol/L (ref 3.5–5.1)
Sodium: 137 mmol/L (ref 135–145)
Total Bilirubin: 0.5 mg/dL (ref 0.3–1.2)
Total Protein: 7 g/dL (ref 6.5–8.1)

## 2017-09-29 LAB — CBC WITH DIFFERENTIAL/PLATELET
Basophils Absolute: 0.1 10*3/uL (ref 0.0–0.1)
Basophils Relative: 1 %
Eosinophils Absolute: 0.2 10*3/uL (ref 0.0–0.5)
Eosinophils Relative: 3 %
HCT: 37.6 % — ABNORMAL LOW (ref 38.4–49.9)
Hemoglobin: 12.7 g/dL — ABNORMAL LOW (ref 13.0–17.1)
Lymphocytes Relative: 23 %
Lymphs Abs: 1.2 10*3/uL (ref 0.9–3.3)
MCH: 30.4 pg (ref 27.2–33.4)
MCHC: 33.8 g/dL (ref 32.0–36.0)
MCV: 90 fL (ref 79.3–98.0)
Monocytes Absolute: 0.5 10*3/uL (ref 0.1–0.9)
Monocytes Relative: 9 %
Neutro Abs: 3.2 10*3/uL (ref 1.5–6.5)
Neutrophils Relative %: 64 %
Platelets: 182 10*3/uL (ref 140–400)
RBC: 4.18 MIL/uL — ABNORMAL LOW (ref 4.20–5.82)
RDW: 12.7 % (ref 11.0–14.6)
WBC: 5 10*3/uL (ref 4.0–10.3)

## 2017-09-29 MED ORDER — IOHEXOL 300 MG/ML  SOLN
100.0000 mL | Freq: Once | INTRAMUSCULAR | Status: AC | PRN
  Administered 2017-09-29: 100 mL via INTRAVENOUS

## 2017-09-30 LAB — PROSTATE-SPECIFIC AG, SERUM (LABCORP): Prostate Specific Ag, Serum: <0.1 ng/mL (ref 0.0–4.0)

## 2017-10-01 ENCOUNTER — Inpatient Hospital Stay (HOSPITAL_BASED_OUTPATIENT_CLINIC_OR_DEPARTMENT_OTHER): Payer: Medicare Other | Admitting: Oncology

## 2017-10-01 ENCOUNTER — Telehealth: Payer: Self-pay | Admitting: Oncology

## 2017-10-01 VITALS — BP 137/71 | HR 68 | Temp 98.5°F | Resp 17 | Ht 71.0 in | Wt 183.9 lb

## 2017-10-01 DIAGNOSIS — R32 Unspecified urinary incontinence: Secondary | ICD-10-CM | POA: Diagnosis not present

## 2017-10-01 DIAGNOSIS — Z923 Personal history of irradiation: Secondary | ICD-10-CM | POA: Diagnosis not present

## 2017-10-01 DIAGNOSIS — Z79899 Other long term (current) drug therapy: Secondary | ICD-10-CM | POA: Diagnosis not present

## 2017-10-01 DIAGNOSIS — Z7982 Long term (current) use of aspirin: Secondary | ICD-10-CM

## 2017-10-01 DIAGNOSIS — Z8546 Personal history of malignant neoplasm of prostate: Secondary | ICD-10-CM

## 2017-10-01 DIAGNOSIS — C61 Malignant neoplasm of prostate: Secondary | ICD-10-CM

## 2017-10-01 NOTE — Telephone Encounter (Signed)
Scheduled appt per 8/8 los - gave patient AVS and calender per los. Central radiology to contact patient with ct scan.

## 2017-10-01 NOTE — Progress Notes (Signed)
Hematology and Oncology Follow Up Visit  [name] [id number] 11-03-1945 72 y.o. 10/01/2017 9:08 AM    Principle Diagnosis: 72 year old man with prostate cancer after presenting with Gleason score 4+3 equal 7, clinical stage T2vN1.  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    Prior Therapy: He presented with a PSA of 4.38 and subsequently underwent a radical prostatectomy and lymph node dissection on November of 2012. 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  He did not have any evidence of angiolymphatic invasion or extraprostatic extension. The margins were negative. He had one out of 12 lymph nodes involved with cancer predominantly in the right pelvic wall.  His post operative PSA remained at 0.65 and subsequently to 0.89. And in March of 2013 he was started on Lupron and have been on it since that time. His PSA remains very low 0.01.  His PSA went up to 0.3 in March 2017. He completed salvage radiation therapy completed in June 2017 under the care of Dr. Tammi Klippel. He received planned dose of 68.4 grays. Received intermittent androgen deprivation therapy last treatment was in 2017.  Current therapy: Active surveillance only.  Interim History:  [name] presents today for a follow-up.  Since last visit, he reports no major changes in his health.  He denies any recent complaints or hospitalizations.  He does report fatigue at times especially when he is exercising in the heat.  He still able to perform day-to-day activities without any decline.  His appetite remained excellent and his weight is stable.  He denies any change in his bowel habits.  He denies any urination difficulties.   He does not report any headaches, blurry vision, syncope or seizures.  He denies any alteration mental status or confusion.  He denies  any fevers or chills or sweats.  He denies any chest pain, palpitation orthopnea.  He denies any cough, wheezing or hemoptysis.  He denies any nausea, vomiting or abdominal pain.  He denies any frequency urgency or hesitancy.  He does not report any lymphadenopathy or petechiae.  He denies any skin rashes or lesions.  The remaining review of systems is negative.   Medications: I have reviewed the patient's current medications.  Current Outpatient Medications  Medication Sig Dispense Refill  . Ascorbic Acid (VITAMIN C PO) Take 1 tablet by mouth daily.    Marland Kitchen ascorbic acid (VITAMIN C) 1000 MG tablet Take 1,000 mg by mouth 3 (three) times a week.    Marland Kitchen aspirin EC 81 MG tablet Take 81 mg by mouth daily.    . Calcium Carbonate-Vitamin D (CALCIUM 600+D3 PO) Take 1 tablet by mouth daily. Patient takes 1-2 tablets daily    . gabapentin (NEURONTIN) 300 MG capsule Take 300 mg by mouth as needed (pain).    Marland Kitchen lisinopril-hydrochlorothiazide (PRINZIDE,ZESTORETIC) 20-12.5 MG tablet Take 1 tablet by mouth daily.  1  . MULTIPLE VITAMIN PO Take 1 tablet by mouth daily.    . naproxen (NAPROSYN) 500 MG tablet Take 500 mg by mouth as needed for mild pain.    Marland Kitchen propranolol (INDERAL) 40 MG tablet Take 1 tablet (40 mg total) by mouth 2 (two) times daily. 180 tablet 3  . simvastatin (ZOCOR) 20 MG tablet Take 20 mg by mouth at bedtime.     Marland Kitchen zolpidem (AMBIEN CR) 6.25 MG CR tablet Take 6.25 mg by mouth at bedtime as needed for  sleep.     No current facility-administered medications for this visit.      Allergies: No Known Allergies  Past Medical History, Surgical history, Social history, and Family History were reviewed and updated.  Physical Exam: Blood pressure 137/71, pulse 68, temperature 98.5 F (36.9 C), temperature source Oral, resp. rate 17, height 5\' 11"  (1.803 m), weight 183 lb 14.4 oz (83.4 kg), SpO2 100 %. ECOG: 0 General appearance: Alert, awake and without distress. Head: Atraumatic without  abnormalities. Oropharynx: Without any thrush or ulcers. Eyes: No scleral icterus. Lymph nodes: No lymphadenopathy noted in the cervical, supraclavicular, or axillary nodes. Heart:regular rate and rhythm, S1, S2 without leg edema. Lung:chest clear, no wheezing.  No dullness to percussion. Abdomin: Soft, nondistended with good bowel sounds.  No rebound or guarding. Musculoskeletal: No joint deformity or effusion.   Neurological: No motor or sensory deficits.   Lab Results: Lab Results  Component Value Date   WBC 5.0 09/29/2017   HGB 12.7 (L) 09/29/2017   HCT 37.6 (L) 09/29/2017   MCV 90.0 09/29/2017   PLT 182 09/29/2017     Chemistry      Component Value Date/Time   NA 137 09/29/2017 1059   NA 141 01/06/2017 0820   K 4.2 09/29/2017 1059   K 4.2 01/06/2017 0820   CL 102 09/29/2017 1059   CO2 25 09/29/2017 1059   CO2 26 01/06/2017 0820   BUN 21 09/29/2017 1059   BUN 28.9 (H) 01/06/2017 0820   CREATININE 1.11 09/29/2017 1059   CREATININE 1.1 01/06/2017 0820      Component Value Date/Time   CALCIUM 9.7 09/29/2017 1059   CALCIUM 9.7 01/06/2017 0820   ALKPHOS 37 (L) 09/29/2017 1059   ALKPHOS 41 01/06/2017 0820   AST 23 09/29/2017 1059   AST 19 01/06/2017 0820   ALT 21 09/29/2017 1059   ALT 16 01/06/2017 0820   BILITOT 0.5 09/29/2017 1059   BILITOT 0.43 01/06/2017 0820     Results for Killgore, Demetrias L (MRN [id number]) as of 10/01/2017 08:56  Ref. Range 01/06/2017 08:19 09/29/2017 10:59  Prostate Specific Ag, Serum Latest Ref Range: 0.0 - 4.0 ng/mL <0.1 <0.1   EXAM: CT CHEST, ABDOMEN, AND PELVIS WITH CONTRAST  TECHNIQUE: Multidetector CT imaging of the chest, abdomen and pelvis was performed following the standard protocol during bolus administration of intravenous contrast.  CONTRAST:  161mL OMNIPAQUE IOHEXOL 300 MG/ML  SOLN  COMPARISON:  01/06/2017  FINDINGS: CT CHEST FINDINGS  Cardiovascular: Coronary, aortic arch, and branch vessel atherosclerotic vascular  disease. Ascending thoracic aortic aneurysm 4.1 cm in diameter on image 32/2, stable.  Mediastinum/Nodes: Small calcified mediastinal and bilateral hilar lymph nodes, stable. No pathologic adenopathy.  Lungs/Pleura: Chronically stable mild nodularity in the lungs, no change from 2015, considered benign.  Musculoskeletal: Thoracic spondylosis.  CT ABDOMEN PELVIS FINDINGS  Hepatobiliary: Several tiny stable hypodense liver lesions are unchanged from prior and compatible with benign etiology. Gallbladder unremarkable.  Pancreas: Unremarkable  Spleen: Unremarkable  Adrenals/Urinary Tract: Small hypodense renal lesions are unchanged from prior and likely cysts although technically too small to characterize. Adrenal glands normal. No urinary tract calculi are observed.  Stomach/Bowel: Unremarkable  Vascular/Lymphatic: Unremarkable  Reproductive: Prostatectomy.  Other: No supplemental non-categorized findings.  Musculoskeletal: Small indirect left inguinal hernia contains adipose tissue. Lumbar spondylosis and degenerative disc disease.  IMPRESSION: 1. No findings of active malignancy. Prior prostatectomy. No adenopathy noted. 2. Stable ascending aortic aneurysm at 4.1 cm in diameter. Recommend annual imaging followup by CTA  or MRA. This recommendation follows 2010 ACCF/AHA/AATS/ACR/ASA/SCA/SCAI/SIR/STS/SVM Guidelines for the Diagnosis and Management of Patients with Thoracic Aortic Disease. Circulation. 2010; 121: V893-Y101 3. Several stable tiny pulmonary nodules are unchanged back through 2015 and considered benign. 4. Other imaging findings of potential clinical significance: Aortic Atherosclerosis (ICD10-I70.0). Coronary atherosclerosis. Old granulomatous disease. Lumbar spondylosis and degenerative disc disease. .   Impression and Plan:  72 year old man with:  1. Prostate cancer presented with a Gleason score of 7, PSA 4.38 and underwent a  prostatectomy with the pathology outlined above.   He remains on active surveillance at this time without any evidence of recurrent disease.  His PSA continues to be undetectable and he is off androgen deprivation therapy for the last 2 years.  CT scan obtained on 09/29/2017 was personally reviewed and discussed with the patient today.  Continues to show no evidence of any active disease with his PSA undetectable.  His last testosterone level earlier from this year remains very low.  The natural course of this disease was discussed today and future treatment options were reviewed.  At this time I recommended active surveillance and reinitiation of androgen deprivation of his PSA starts to rise.  I recommended continue repeat imaging studies every 9 to 12 months.  2. Androgen depravation: Has been on hold for 2 years at this time with testosterone still likely at a castrate level.  This could be restarted in the future his PSA starts to rise.  3. Bone health: He is on calcium and vitamin D which I recommended given his low testosterone level and risk of osteoporosis.  4. Incontinence: Dramatically change at this time continues to follow with Dr. Alinda Money regarding this issue.  5. Follow-up: Will be in 9 months to follow-up on his clinical status.  15  minutes was spent with the patient face-to-face today.  More than 50% of time was dedicated to discussing the natural course of this disease, reviewing imaging studies and coordinating his future care.   Zola Button, MD 8/8/20199:08 AM

## 2017-11-12 DIAGNOSIS — M5416 Radiculopathy, lumbar region: Secondary | ICD-10-CM | POA: Diagnosis not present

## 2017-12-01 DIAGNOSIS — D1801 Hemangioma of skin and subcutaneous tissue: Secondary | ICD-10-CM | POA: Diagnosis not present

## 2017-12-01 DIAGNOSIS — L570 Actinic keratosis: Secondary | ICD-10-CM | POA: Diagnosis not present

## 2017-12-01 DIAGNOSIS — Z85820 Personal history of malignant melanoma of skin: Secondary | ICD-10-CM | POA: Diagnosis not present

## 2017-12-01 DIAGNOSIS — Z85828 Personal history of other malignant neoplasm of skin: Secondary | ICD-10-CM | POA: Diagnosis not present

## 2017-12-01 DIAGNOSIS — D485 Neoplasm of uncertain behavior of skin: Secondary | ICD-10-CM | POA: Diagnosis not present

## 2017-12-01 DIAGNOSIS — L821 Other seborrheic keratosis: Secondary | ICD-10-CM | POA: Diagnosis not present

## 2017-12-01 DIAGNOSIS — L820 Inflamed seborrheic keratosis: Secondary | ICD-10-CM | POA: Diagnosis not present

## 2017-12-14 DIAGNOSIS — M5126 Other intervertebral disc displacement, lumbar region: Secondary | ICD-10-CM | POA: Diagnosis not present

## 2017-12-14 DIAGNOSIS — M5416 Radiculopathy, lumbar region: Secondary | ICD-10-CM | POA: Diagnosis not present

## 2017-12-14 DIAGNOSIS — Z6825 Body mass index (BMI) 25.0-25.9, adult: Secondary | ICD-10-CM | POA: Diagnosis not present

## 2017-12-14 DIAGNOSIS — I10 Essential (primary) hypertension: Secondary | ICD-10-CM | POA: Diagnosis not present

## 2017-12-14 DIAGNOSIS — M545 Low back pain: Secondary | ICD-10-CM | POA: Diagnosis not present

## 2017-12-15 DIAGNOSIS — Z23 Encounter for immunization: Secondary | ICD-10-CM | POA: Diagnosis not present

## 2017-12-15 DIAGNOSIS — I10 Essential (primary) hypertension: Secondary | ICD-10-CM | POA: Diagnosis not present

## 2017-12-15 DIAGNOSIS — F439 Reaction to severe stress, unspecified: Secondary | ICD-10-CM | POA: Diagnosis not present

## 2017-12-22 DIAGNOSIS — M9903 Segmental and somatic dysfunction of lumbar region: Secondary | ICD-10-CM | POA: Diagnosis not present

## 2017-12-22 DIAGNOSIS — M5442 Lumbago with sciatica, left side: Secondary | ICD-10-CM | POA: Diagnosis not present

## 2017-12-23 DIAGNOSIS — M5442 Lumbago with sciatica, left side: Secondary | ICD-10-CM | POA: Diagnosis not present

## 2017-12-23 DIAGNOSIS — M9903 Segmental and somatic dysfunction of lumbar region: Secondary | ICD-10-CM | POA: Diagnosis not present

## 2017-12-28 DIAGNOSIS — M9903 Segmental and somatic dysfunction of lumbar region: Secondary | ICD-10-CM | POA: Diagnosis not present

## 2017-12-28 DIAGNOSIS — M5442 Lumbago with sciatica, left side: Secondary | ICD-10-CM | POA: Diagnosis not present

## 2017-12-29 DIAGNOSIS — M5442 Lumbago with sciatica, left side: Secondary | ICD-10-CM | POA: Diagnosis not present

## 2017-12-29 DIAGNOSIS — C61 Malignant neoplasm of prostate: Secondary | ICD-10-CM | POA: Diagnosis not present

## 2017-12-29 DIAGNOSIS — M9903 Segmental and somatic dysfunction of lumbar region: Secondary | ICD-10-CM | POA: Diagnosis not present

## 2017-12-30 DIAGNOSIS — M9903 Segmental and somatic dysfunction of lumbar region: Secondary | ICD-10-CM | POA: Diagnosis not present

## 2017-12-30 DIAGNOSIS — M5442 Lumbago with sciatica, left side: Secondary | ICD-10-CM | POA: Diagnosis not present

## 2018-01-04 DIAGNOSIS — M5442 Lumbago with sciatica, left side: Secondary | ICD-10-CM | POA: Diagnosis not present

## 2018-01-04 DIAGNOSIS — M9903 Segmental and somatic dysfunction of lumbar region: Secondary | ICD-10-CM | POA: Diagnosis not present

## 2018-01-05 DIAGNOSIS — N393 Stress incontinence (female) (male): Secondary | ICD-10-CM | POA: Diagnosis not present

## 2018-01-05 DIAGNOSIS — M9903 Segmental and somatic dysfunction of lumbar region: Secondary | ICD-10-CM | POA: Diagnosis not present

## 2018-01-05 DIAGNOSIS — M5442 Lumbago with sciatica, left side: Secondary | ICD-10-CM | POA: Diagnosis not present

## 2018-01-05 DIAGNOSIS — C61 Malignant neoplasm of prostate: Secondary | ICD-10-CM | POA: Diagnosis not present

## 2018-01-07 DIAGNOSIS — M5442 Lumbago with sciatica, left side: Secondary | ICD-10-CM | POA: Diagnosis not present

## 2018-01-07 DIAGNOSIS — M9903 Segmental and somatic dysfunction of lumbar region: Secondary | ICD-10-CM | POA: Diagnosis not present

## 2018-01-11 DIAGNOSIS — G250 Essential tremor: Secondary | ICD-10-CM | POA: Diagnosis not present

## 2018-01-11 DIAGNOSIS — G4700 Insomnia, unspecified: Secondary | ICD-10-CM | POA: Diagnosis not present

## 2018-01-11 DIAGNOSIS — Z1211 Encounter for screening for malignant neoplasm of colon: Secondary | ICD-10-CM | POA: Diagnosis not present

## 2018-01-11 DIAGNOSIS — Z0000 Encounter for general adult medical examination without abnormal findings: Secondary | ICD-10-CM | POA: Diagnosis not present

## 2018-01-11 DIAGNOSIS — C61 Malignant neoplasm of prostate: Secondary | ICD-10-CM | POA: Diagnosis not present

## 2018-01-11 DIAGNOSIS — M9903 Segmental and somatic dysfunction of lumbar region: Secondary | ICD-10-CM | POA: Diagnosis not present

## 2018-01-11 DIAGNOSIS — M25552 Pain in left hip: Secondary | ICD-10-CM | POA: Diagnosis not present

## 2018-01-11 DIAGNOSIS — I10 Essential (primary) hypertension: Secondary | ICD-10-CM | POA: Diagnosis not present

## 2018-01-11 DIAGNOSIS — Z23 Encounter for immunization: Secondary | ICD-10-CM | POA: Diagnosis not present

## 2018-01-11 DIAGNOSIS — Z1389 Encounter for screening for other disorder: Secondary | ICD-10-CM | POA: Diagnosis not present

## 2018-01-11 DIAGNOSIS — E7800 Pure hypercholesterolemia, unspecified: Secondary | ICD-10-CM | POA: Diagnosis not present

## 2018-01-11 DIAGNOSIS — M5442 Lumbago with sciatica, left side: Secondary | ICD-10-CM | POA: Diagnosis not present

## 2018-01-11 DIAGNOSIS — F439 Reaction to severe stress, unspecified: Secondary | ICD-10-CM | POA: Diagnosis not present

## 2018-01-11 DIAGNOSIS — D696 Thrombocytopenia, unspecified: Secondary | ICD-10-CM | POA: Diagnosis not present

## 2018-01-12 DIAGNOSIS — M9903 Segmental and somatic dysfunction of lumbar region: Secondary | ICD-10-CM | POA: Diagnosis not present

## 2018-01-12 DIAGNOSIS — M5442 Lumbago with sciatica, left side: Secondary | ICD-10-CM | POA: Diagnosis not present

## 2018-01-13 DIAGNOSIS — M5416 Radiculopathy, lumbar region: Secondary | ICD-10-CM | POA: Diagnosis not present

## 2018-01-14 DIAGNOSIS — M9903 Segmental and somatic dysfunction of lumbar region: Secondary | ICD-10-CM | POA: Diagnosis not present

## 2018-01-14 DIAGNOSIS — M5442 Lumbago with sciatica, left side: Secondary | ICD-10-CM | POA: Diagnosis not present

## 2018-01-18 DIAGNOSIS — M9903 Segmental and somatic dysfunction of lumbar region: Secondary | ICD-10-CM | POA: Diagnosis not present

## 2018-01-18 DIAGNOSIS — M5442 Lumbago with sciatica, left side: Secondary | ICD-10-CM | POA: Diagnosis not present

## 2018-01-19 DIAGNOSIS — M5442 Lumbago with sciatica, left side: Secondary | ICD-10-CM | POA: Diagnosis not present

## 2018-01-19 DIAGNOSIS — M9903 Segmental and somatic dysfunction of lumbar region: Secondary | ICD-10-CM | POA: Diagnosis not present

## 2018-02-08 DIAGNOSIS — M25552 Pain in left hip: Secondary | ICD-10-CM | POA: Diagnosis not present

## 2018-02-08 DIAGNOSIS — I10 Essential (primary) hypertension: Secondary | ICD-10-CM | POA: Diagnosis not present

## 2018-02-08 DIAGNOSIS — M545 Low back pain: Secondary | ICD-10-CM | POA: Diagnosis not present

## 2018-02-08 DIAGNOSIS — M5416 Radiculopathy, lumbar region: Secondary | ICD-10-CM | POA: Diagnosis not present

## 2018-02-08 DIAGNOSIS — M5126 Other intervertebral disc displacement, lumbar region: Secondary | ICD-10-CM | POA: Diagnosis not present

## 2018-02-08 DIAGNOSIS — M48061 Spinal stenosis, lumbar region without neurogenic claudication: Secondary | ICD-10-CM | POA: Diagnosis not present

## 2018-02-08 DIAGNOSIS — Z6825 Body mass index (BMI) 25.0-25.9, adult: Secondary | ICD-10-CM | POA: Diagnosis not present

## 2018-02-11 ENCOUNTER — Other Ambulatory Visit: Payer: Self-pay | Admitting: Neurosurgery

## 2018-02-11 DIAGNOSIS — M5416 Radiculopathy, lumbar region: Secondary | ICD-10-CM

## 2018-02-16 ENCOUNTER — Ambulatory Visit
Admission: RE | Admit: 2018-02-16 | Discharge: 2018-02-16 | Disposition: A | Discharge status: Home / Self Care | Payer: Medicare Other | Source: Ambulatory Visit | Attending: Neurosurgery | Admitting: Neurosurgery

## 2018-02-16 DIAGNOSIS — M5416 Radiculopathy, lumbar region: Secondary | ICD-10-CM

## 2018-02-16 DIAGNOSIS — M48061 Spinal stenosis, lumbar region without neurogenic claudication: Secondary | ICD-10-CM | POA: Diagnosis not present

## 2018-03-03 DIAGNOSIS — M48061 Spinal stenosis, lumbar region without neurogenic claudication: Secondary | ICD-10-CM | POA: Diagnosis not present

## 2018-03-03 DIAGNOSIS — M545 Low back pain: Secondary | ICD-10-CM | POA: Diagnosis not present

## 2018-03-03 DIAGNOSIS — Z6825 Body mass index (BMI) 25.0-25.9, adult: Secondary | ICD-10-CM | POA: Diagnosis not present

## 2018-03-03 DIAGNOSIS — I10 Essential (primary) hypertension: Secondary | ICD-10-CM | POA: Diagnosis not present

## 2018-03-03 DIAGNOSIS — M5416 Radiculopathy, lumbar region: Secondary | ICD-10-CM | POA: Insufficient documentation

## 2018-03-03 DIAGNOSIS — M5126 Other intervertebral disc displacement, lumbar region: Secondary | ICD-10-CM | POA: Diagnosis not present

## 2018-03-04 ENCOUNTER — Other Ambulatory Visit: Payer: Self-pay | Admitting: Neurosurgery

## 2018-03-23 NOTE — Pre-Procedure Instructions (Signed)
[name]  03/23/2018      [facility] [id number] [name], [name] [name] [name] [id number] Phone: [phone] Fax: [phone]    Your procedure is scheduled on Thurs., Feb. 6, 2020 from 10:10AM-11:54AM  Report to [facility] Admitting Entrance "A" at 8:10AM  Call this number if you have problems the morning of surgery:  [phone]   Remember:  Do not eat or drink after midnight on Feb. 5th   Take these medicines the morning of surgery with A SIP OF WATER: If needed: Acetaminophen (TYLENOL)  7 days before surgery (03/25/18), stop taking all Other Aspirin Products, Vitamins, Fish oils, and Herbal medications. Also stop all NSAIDS i.e. Advil, Ibuprofen, Motrin, Aleve, Anaprox, Naproxen, BC, Goody Powders, and all Supplements.    Do not wear jewelry.  Do not wear lotions, powders, colognes, or deodorant.  Do not shave 48 hours prior to surgery.  Men may shave face.  Do not bring valuables to the hospital.  [facility] is not responsible for any belongings or valuables.  Contacts, dentures or bridgework may not be worn into surgery.  Leave your suitcase in the car.  After surgery it may be brought to your room.  For patients admitted to the hospital, discharge time will be determined by your treatment team.  Patients discharged the day of surgery will not be allowed to drive home.   Special instructions:  [facility]- Preparing For Surgery  Before surgery, you can play an important role. Because skin is not sterile, your skin needs to be as free of germs as possible. You can reduce the number of germs on your skin by washing with CHG (chlorahexidine gluconate) Soap before surgery.  CHG is an antiseptic cleaner which kills germs and bonds with the skin to continue killing germs even after washing.    Oral Hygiene is also important to reduce your risk of infection.  Remember - BRUSH YOUR TEETH THE MORNING OF SURGERY WITH YOUR  REGULAR TOOTHPASTE  Please do not use if you have an allergy to CHG or antibacterial soaps. If your skin becomes reddened/irritated stop using the CHG.  Do not shave (including legs and underarms) for at least 48 hours prior to first CHG shower. It is OK to shave your face.  Please follow these instructions carefully.   1. Shower the NIGHT BEFORE SURGERY and the MORNING OF SURGERY with CHG.   2. If you chose to wash your hair, wash your hair first as usual with your normal shampoo.  3. After you shampoo, rinse your hair and body thoroughly to remove the shampoo.  4. Use CHG as you would any other liquid soap. You can apply CHG directly to the skin and wash gently with a scrungie or a clean washcloth.   5. Apply the CHG Soap to your body ONLY FROM THE NECK DOWN.  Do not use on open wounds or open sores. Avoid contact with your eyes, ears, mouth and genitals (private parts). Wash Face and genitals (private parts)  with your normal soap.  6. Wash thoroughly, paying special attention to the area where your surgery will be performed.  7. Thoroughly rinse your body with warm water from the neck down.  8. DO NOT shower/wash with your normal soap after using and rinsing off the CHG Soap.  9. Pat yourself dry with a CLEAN TOWEL.  10. Wear CLEAN PAJAMAS to bed the night before surgery, wear comfortable  clothes the morning of surgery  11. Place CLEAN SHEETS on your bed the night of your first shower and DO NOT SLEEP WITH PETS.  Day of Surgery:  Do not apply any deodorants/lotions.  Please wear clean clothes to the hospital/surgery center.   Remember to brush your teeth WITH YOUR REGULAR TOOTHPASTE.  Please read over the following fact sheets that you were given. Pain Booklet, Coughing and Deep Breathing, MRSA Information and Surgical Site Infection Prevention

## 2018-03-24 ENCOUNTER — Encounter (HOSPITAL_COMMUNITY): Payer: Self-pay

## 2018-03-24 ENCOUNTER — Encounter (HOSPITAL_COMMUNITY)
Admission: RE | Admit: 2018-03-24 | Discharge: 2018-03-24 | Disposition: A | Discharge status: Home / Self Care | Payer: Medicare Other | Source: Ambulatory Visit | Attending: Neurosurgery | Admitting: Neurosurgery

## 2018-03-24 ENCOUNTER — Other Ambulatory Visit: Payer: Self-pay

## 2018-03-24 DIAGNOSIS — Z01812 Encounter for preprocedural laboratory examination: Secondary | ICD-10-CM | POA: Insufficient documentation

## 2018-03-24 LAB — BASIC METABOLIC PANEL
Anion gap: 8 (ref 5–15)
BUN: 24 mg/dL — ABNORMAL HIGH (ref 8–23)
CO2: 28 mmol/L (ref 22–32)
Calcium: 10.6 mg/dL — ABNORMAL HIGH (ref 8.9–10.3)
Chloride: 102 mmol/L (ref 98–111)
Creatinine, Ser: 0.99 mg/dL (ref 0.61–1.24)
GFR calc Af Amer: >60 mL/min (ref >60)
GFR calc non Af Amer: >60 mL/min (ref >60)
Glucose, Bld: 102 mg/dL — ABNORMAL HIGH (ref 70–99)
Potassium: 3.8 mmol/L (ref 3.5–5.1)
Sodium: 138 mmol/L (ref 135–145)

## 2018-03-24 LAB — SURGICAL PCR SCREEN
MRSA, PCR: NEGATIVE
Staphylococcus aureus: NEGATIVE

## 2018-03-24 LAB — CBC
HCT: 40.7 % (ref 39.0–52.0)
Hemoglobin: 13.2 g/dL (ref 13.0–17.0)
MCH: 29.5 pg (ref 26.0–34.0)
MCHC: 32.4 g/dL (ref 30.0–36.0)
MCV: 90.8 fL (ref 80.0–100.0)
Platelets: 183 10*3/uL (ref 150–400)
RBC: 4.48 MIL/uL (ref 4.22–5.81)
RDW: 12.3 % (ref 11.5–15.5)
WBC: 5.8 10*3/uL (ref 4.0–10.5)
nRBC: 0 % (ref 0.0–0.2)

## 2018-03-24 NOTE — Progress Notes (Signed)
PCP - Antony Contras Cardiologist - Dr. Dorris Carnes  Chest x-ray - N/A EKG - 05-21-17 Stress Test - 05-09-17 ECHO - N/A Cardiac Cath - N/A   Anesthesia review: Yes, heart history  Patient denies shortness of breath, fever, cough and chest pain at PAT appointment  Patient verbalized understanding of instructions that were given to them at the PAT appointment. Patient was also instructed that they will need to review over the PAT instructions again at home before surgery.

## 2018-03-25 NOTE — Anesthesia Preprocedure Evaluation (Addendum)
Anesthesia Evaluation  Patient identified by MRN, date of birth, ID band Patient awake    Reviewed: Allergy & Precautions, NPO status , Patient's Chart, lab work & pertinent test results  History of Anesthesia Complications Negative for: history of anesthetic complications  Airway Mallampati: II  TM Distance: >3 FB Neck ROM: Full    Dental no notable dental hx.    Pulmonary neg pulmonary ROS,    Pulmonary exam normal        Cardiovascular hypertension, Normal cardiovascular exam     Neuro/Psych negative neurological ROS  negative psych ROS   GI/Hepatic negative GI ROS, Neg liver ROS,   Endo/Other  negative endocrine ROS  Renal/GU negative Renal ROS  negative genitourinary   Musculoskeletal negative musculoskeletal ROS (+)   Abdominal   Peds  Hematology negative hematology ROS (+)   Anesthesia Other Findings 73 yo M for L4-5 microdiscectomy - HTN, HLD - stress test 2016: normal perfusion, EF 59%  Reproductive/Obstetrics                           Anesthesia Physical Anesthesia Plan  ASA: II  Anesthesia Plan: General   Post-op Pain Management:    Induction: Intravenous  PONV Risk Score and Plan: 2 and Ondansetron, Dexamethasone and Treatment may vary due to age or medical condition  Airway Management Planned: Oral ETT  Additional Equipment: None  Intra-op Plan:   Post-operative Plan: Extubation in OR  Informed Consent: I have reviewed the patients History and Physical, chart, labs and discussed the procedure including the risks, benefits and alternatives for the proposed anesthesia with the patient or authorized representative who has indicated his/her understanding and acceptance.     Dental advisory given  Plan Discussed with:   Anesthesia Plan Comments: (Pt followed by Dr. Dorris Carnes for mild coronary calcification seen on CT and risk factor management. Last seen  05/21/17, rec'd 54yr f/u. Nuclear stress in 2016 showed EF 59%, normal wall motion, no ischemia. )      Anesthesia Quick Evaluation

## 2018-03-30 DIAGNOSIS — C61 Malignant neoplasm of prostate: Secondary | ICD-10-CM | POA: Diagnosis not present

## 2018-04-01 ENCOUNTER — Ambulatory Visit (HOSPITAL_COMMUNITY): Payer: Medicare Other

## 2018-04-01 ENCOUNTER — Other Ambulatory Visit: Payer: Self-pay

## 2018-04-01 ENCOUNTER — Ambulatory Visit (HOSPITAL_COMMUNITY): Payer: Medicare Other | Admitting: Certified Registered Nurse Anesthetist

## 2018-04-01 ENCOUNTER — Observation Stay (HOSPITAL_COMMUNITY)
Admission: RE | Admit: 2018-04-01 | Discharge: 2018-04-02 | Disposition: A | Discharge status: Home / Self Care | Payer: Medicare Other | Attending: Neurosurgery | Admitting: Neurosurgery

## 2018-04-01 ENCOUNTER — Encounter (HOSPITAL_COMMUNITY): Payer: Self-pay

## 2018-04-01 ENCOUNTER — Encounter (HOSPITAL_COMMUNITY)
Admission: RE | Disposition: A | Discharge status: Home / Self Care | Payer: Self-pay | Source: Home / Self Care | Attending: Neurosurgery

## 2018-04-01 ENCOUNTER — Ambulatory Visit (HOSPITAL_COMMUNITY): Payer: Medicare Other | Admitting: Physician Assistant

## 2018-04-01 DIAGNOSIS — I10 Essential (primary) hypertension: Secondary | ICD-10-CM | POA: Diagnosis not present

## 2018-04-01 DIAGNOSIS — Z79899 Other long term (current) drug therapy: Secondary | ICD-10-CM | POA: Diagnosis not present

## 2018-04-01 DIAGNOSIS — M4726 Other spondylosis with radiculopathy, lumbar region: Secondary | ICD-10-CM | POA: Diagnosis not present

## 2018-04-01 DIAGNOSIS — E785 Hyperlipidemia, unspecified: Secondary | ICD-10-CM | POA: Diagnosis not present

## 2018-04-01 DIAGNOSIS — M5116 Intervertebral disc disorders with radiculopathy, lumbar region: Principal | ICD-10-CM | POA: Insufficient documentation

## 2018-04-01 DIAGNOSIS — M5126 Other intervertebral disc displacement, lumbar region: Secondary | ICD-10-CM | POA: Diagnosis not present

## 2018-04-01 DIAGNOSIS — Z981 Arthrodesis status: Secondary | ICD-10-CM | POA: Diagnosis not present

## 2018-04-01 DIAGNOSIS — Z419 Encounter for procedure for purposes other than remedying health state, unspecified: Secondary | ICD-10-CM

## 2018-04-01 DIAGNOSIS — M48061 Spinal stenosis, lumbar region without neurogenic claudication: Secondary | ICD-10-CM | POA: Insufficient documentation

## 2018-04-01 HISTORY — PX: LUMBAR LAMINECTOMY/DECOMPRESSION MICRODISCECTOMY: SHX5026

## 2018-04-01 SURGERY — LUMBAR LAMINECTOMY/DECOMPRESSION MICRODISCECTOMY 1 LEVEL
Anesthesia: General | Site: Spine Lumbar | Laterality: Left

## 2018-04-01 MED ORDER — PHENYLEPHRINE 40 MCG/ML (10ML) SYRINGE FOR IV PUSH (FOR BLOOD PRESSURE SUPPORT)
PREFILLED_SYRINGE | INTRAVENOUS | Status: AC
  Filled 2018-04-01: qty 10

## 2018-04-01 MED ORDER — ACETAMINOPHEN 650 MG RE SUPP: 650.0000 mg | RECTAL | Status: DC | PRN

## 2018-04-01 MED ORDER — LACTATED RINGERS IV SOLN
INTRAVENOUS | Status: DC
  Administered 2018-04-01 (×3): via INTRAVENOUS

## 2018-04-01 MED ORDER — ACETAMINOPHEN 500 MG PO TABS: 1000.0000 mg | ORAL_TABLET | Freq: Four times a day (QID) | ORAL | Status: DC | PRN

## 2018-04-01 MED ORDER — KCL IN DEXTROSE-NACL 20-5-0.45 MEQ/L-%-% IV SOLN: INTRAVENOUS | Status: DC

## 2018-04-01 MED ORDER — LIDOCAINE-EPINEPHRINE 1 %-1:100000 IJ SOLN
INTRAMUSCULAR | Status: AC
  Filled 2018-04-01: qty 1

## 2018-04-01 MED ORDER — LISINOPRIL 10 MG PO TABS
10.0000 mg | ORAL_TABLET | Freq: Every day | ORAL | Status: DC
  Administered 2018-04-01: 10 mg via ORAL
  Filled 2018-04-01: qty 1

## 2018-04-01 MED ORDER — METHYLPREDNISOLONE ACETATE 80 MG/ML IJ SUSP
INTRAMUSCULAR | Status: DC | PRN
  Administered 2018-04-01: 80 mg

## 2018-04-01 MED ORDER — PROPOFOL 10 MG/ML IV BOLUS
INTRAVENOUS | Status: AC
  Filled 2018-04-01: qty 20

## 2018-04-01 MED ORDER — ONDANSETRON HCL 4 MG/2ML IJ SOLN
INTRAMUSCULAR | Status: AC
  Filled 2018-04-01: qty 2

## 2018-04-01 MED ORDER — PHENOL 1.4 % MT LIQD: 1.0000 | OROMUCOSAL | Status: DC | PRN

## 2018-04-01 MED ORDER — CALCIUM CARBONATE-VITAMIN D 500-200 MG-UNIT PO TABS
1.0000 | ORAL_TABLET | Freq: Every day | ORAL | Status: DC
  Filled 2018-04-01 (×2): qty 1

## 2018-04-01 MED ORDER — SODIUM CHLORIDE 0.9% FLUSH
3.0000 mL | Freq: Two times a day (BID) | INTRAVENOUS | Status: DC
  Administered 2018-04-01: 3 mL via INTRAVENOUS

## 2018-04-01 MED ORDER — CHLORHEXIDINE GLUCONATE CLOTH 2 % EX PADS: 6.0000 | MEDICATED_PAD | Freq: Once | CUTANEOUS | Status: DC

## 2018-04-01 MED ORDER — DEXAMETHASONE SODIUM PHOSPHATE 10 MG/ML IJ SOLN
INTRAMUSCULAR | Status: DC | PRN
  Administered 2018-04-01: 4 mg via INTRAVENOUS

## 2018-04-01 MED ORDER — PANTOPRAZOLE SODIUM 40 MG PO TBEC
40.0000 mg | DELAYED_RELEASE_TABLET | Freq: Every day | ORAL | Status: DC
  Administered 2018-04-01: 40 mg via ORAL
  Filled 2018-04-01: qty 1

## 2018-04-01 MED ORDER — THROMBIN 5000 UNITS EX SOLR
OROMUCOSAL | Status: DC | PRN
  Administered 2018-04-01: 5 mL via TOPICAL

## 2018-04-01 MED ORDER — HYDROCHLOROTHIAZIDE 12.5 MG PO CAPS
12.5000 mg | ORAL_CAPSULE | Freq: Every day | ORAL | Status: DC
  Administered 2018-04-01: 12.5 mg via ORAL
  Filled 2018-04-01: qty 1

## 2018-04-01 MED ORDER — BUPIVACAINE HCL (PF) 0.5 % IJ SOLN
INTRAMUSCULAR | Status: DC | PRN
  Administered 2018-04-01: 5 mL

## 2018-04-01 MED ORDER — FENTANYL CITRATE (PF) 100 MCG/2ML IJ SOLN
INTRAMUSCULAR | Status: DC | PRN
  Administered 2018-04-01: 100 ug via INTRAVENOUS

## 2018-04-01 MED ORDER — METHYLPREDNISOLONE ACETATE 80 MG/ML IJ SUSP
INTRAMUSCULAR | Status: AC
  Filled 2018-04-01: qty 1

## 2018-04-01 MED ORDER — ONDANSETRON HCL 4 MG PO TABS: 4.0000 mg | ORAL_TABLET | Freq: Four times a day (QID) | ORAL | Status: DC | PRN

## 2018-04-01 MED ORDER — GRX ANALGESIC BALM EX OINT: 1.0000 "application " | TOPICAL_OINTMENT | Freq: Every day | CUTANEOUS | Status: DC | PRN

## 2018-04-01 MED ORDER — MENTHOL 3 MG MT LOZG: 1.0000 | LOZENGE | OROMUCOSAL | Status: DC | PRN

## 2018-04-01 MED ORDER — OXYCODONE HCL 5 MG PO TABS
5.0000 mg | ORAL_TABLET | Freq: Once | ORAL | Status: AC | PRN
  Administered 2018-04-01: 5 mg via ORAL

## 2018-04-01 MED ORDER — ROCURONIUM BROMIDE 50 MG/5ML IV SOSY
PREFILLED_SYRINGE | INTRAVENOUS | Status: DC | PRN
  Administered 2018-04-01: 50 mg via INTRAVENOUS

## 2018-04-01 MED ORDER — VITAMIN C 500 MG PO TABS: 1000.0000 mg | ORAL_TABLET | ORAL | Status: DC

## 2018-04-01 MED ORDER — FENTANYL CITRATE (PF) 100 MCG/2ML IJ SOLN
25.0000 ug | INTRAMUSCULAR | Status: DC | PRN
  Administered 2018-04-01: 50 ug via INTRAVENOUS
  Administered 2018-04-01 (×2): 25 ug via INTRAVENOUS

## 2018-04-01 MED ORDER — BUPIVACAINE HCL (PF) 0.5 % IJ SOLN
INTRAMUSCULAR | Status: AC
  Filled 2018-04-01: qty 30

## 2018-04-01 MED ORDER — DOCUSATE SODIUM 100 MG PO CAPS
100.0000 mg | ORAL_CAPSULE | Freq: Two times a day (BID) | ORAL | Status: DC
  Administered 2018-04-01 (×2): 100 mg via ORAL
  Filled 2018-04-01 (×2): qty 1

## 2018-04-01 MED ORDER — POLYETHYLENE GLYCOL 3350 17 G PO PACK: 17.0000 g | PACK | Freq: Every day | ORAL | Status: DC | PRN

## 2018-04-01 MED ORDER — OXYCODONE HCL 5 MG PO TABS: 5.0000 mg | ORAL_TABLET | ORAL | Status: DC | PRN

## 2018-04-01 MED ORDER — CEFAZOLIN SODIUM-DEXTROSE 2-4 GM/100ML-% IV SOLN
2.0000 g | Freq: Three times a day (TID) | INTRAVENOUS | Status: AC
  Administered 2018-04-01 – 2018-04-02 (×2): 2 g via INTRAVENOUS
  Filled 2018-04-01 (×2): qty 100

## 2018-04-01 MED ORDER — LIDOCAINE 2% (20 MG/ML) 5 ML SYRINGE
INTRAMUSCULAR | Status: DC | PRN
  Administered 2018-04-01: 100 mg via INTRAVENOUS

## 2018-04-01 MED ORDER — OXYCODONE HCL 5 MG PO TABS
ORAL_TABLET | ORAL | Status: AC
  Filled 2018-04-01: qty 1

## 2018-04-01 MED ORDER — ACETAMINOPHEN 325 MG PO TABS: 650.0000 mg | ORAL_TABLET | ORAL | Status: DC | PRN

## 2018-04-01 MED ORDER — 0.9 % SODIUM CHLORIDE (POUR BTL) OPTIME
TOPICAL | Status: DC | PRN
  Administered 2018-04-01: 1000 mL

## 2018-04-01 MED ORDER — CEFAZOLIN SODIUM-DEXTROSE 2-4 GM/100ML-% IV SOLN
2.0000 g | INTRAVENOUS | Status: AC
  Administered 2018-04-01: 2 g via INTRAVENOUS
  Filled 2018-04-01: qty 100

## 2018-04-01 MED ORDER — METHOCARBAMOL 1000 MG/10ML IJ SOLN
500.0000 mg | Freq: Four times a day (QID) | INTRAVENOUS | Status: DC | PRN
  Filled 2018-04-01: qty 5

## 2018-04-01 MED ORDER — LIDOCAINE-EPINEPHRINE 1 %-1:100000 IJ SOLN
INTRAMUSCULAR | Status: DC | PRN
  Administered 2018-04-01: 5 mL

## 2018-04-01 MED ORDER — FENTANYL CITRATE (PF) 100 MCG/2ML IJ SOLN
INTRAMUSCULAR | Status: DC | PRN
  Administered 2018-04-01: 50 ug via INTRAVENOUS
  Administered 2018-04-01: 150 ug via INTRAVENOUS
  Administered 2018-04-01: 50 ug via INTRAVENOUS

## 2018-04-01 MED ORDER — SODIUM CHLORIDE 0.9% FLUSH: 3.0000 mL | INTRAVENOUS | Status: DC | PRN

## 2018-04-01 MED ORDER — SIMVASTATIN 20 MG PO TABS
20.0000 mg | ORAL_TABLET | Freq: Every day | ORAL | Status: DC
  Administered 2018-04-01: 20 mg via ORAL
  Filled 2018-04-01: qty 1

## 2018-04-01 MED ORDER — OXYCODONE HCL 5 MG/5ML PO SOLN: 5.0000 mg | Freq: Once | ORAL | Status: AC | PRN

## 2018-04-01 MED ORDER — FENTANYL CITRATE (PF) 100 MCG/2ML IJ SOLN
INTRAMUSCULAR | Status: AC
  Filled 2018-04-01: qty 2

## 2018-04-01 MED ORDER — ALUM & MAG HYDROXIDE-SIMETH 200-200-20 MG/5ML PO SUSP: 30.0000 mL | Freq: Four times a day (QID) | ORAL | Status: DC | PRN

## 2018-04-01 MED ORDER — BISACODYL 10 MG RE SUPP: 10.0000 mg | Freq: Every day | RECTAL | Status: DC | PRN

## 2018-04-01 MED ORDER — ONDANSETRON HCL 4 MG/2ML IJ SOLN: 4.0000 mg | Freq: Four times a day (QID) | INTRAMUSCULAR | Status: DC | PRN

## 2018-04-01 MED ORDER — ONDANSETRON HCL 4 MG/2ML IJ SOLN: 4.0000 mg | Freq: Once | INTRAMUSCULAR | Status: DC | PRN

## 2018-04-01 MED ORDER — ONDANSETRON HCL 4 MG/2ML IJ SOLN
INTRAMUSCULAR | Status: DC | PRN
  Administered 2018-04-01: 4 mg via INTRAVENOUS

## 2018-04-01 MED ORDER — PANTOPRAZOLE SODIUM 40 MG IV SOLR: 40.0000 mg | Freq: Every day | INTRAVENOUS | Status: DC

## 2018-04-01 MED ORDER — PROPOFOL 10 MG/ML IV BOLUS
INTRAVENOUS | Status: DC | PRN
  Administered 2018-04-01: 140 mg via INTRAVENOUS

## 2018-04-01 MED ORDER — THROMBIN 5000 UNITS EX SOLR
CUTANEOUS | Status: AC
  Filled 2018-04-01: qty 5000

## 2018-04-01 MED ORDER — FLEET ENEMA 7-19 GM/118ML RE ENEM: 1.0000 | ENEMA | Freq: Once | RECTAL | Status: DC | PRN

## 2018-04-01 MED ORDER — EPHEDRINE SULFATE 50 MG/ML IJ SOLN
INTRAMUSCULAR | Status: DC | PRN
  Administered 2018-04-01 (×2): 5 mg via INTRAVENOUS

## 2018-04-01 MED ORDER — ADULT MULTIVITAMIN W/MINERALS CH
ORAL_TABLET | ORAL | Status: DC
  Administered 2018-04-01: 1 via ORAL
  Filled 2018-04-01: qty 1

## 2018-04-01 MED ORDER — ZOLPIDEM TARTRATE 5 MG PO TABS
5.0000 mg | ORAL_TABLET | Freq: Every evening | ORAL | Status: DC | PRN
  Administered 2018-04-02: 5 mg via ORAL
  Filled 2018-04-01: qty 1

## 2018-04-01 MED ORDER — DEXAMETHASONE SODIUM PHOSPHATE 10 MG/ML IJ SOLN
INTRAMUSCULAR | Status: AC
  Filled 2018-04-01: qty 1

## 2018-04-01 MED ORDER — SUGAMMADEX SODIUM 200 MG/2ML IV SOLN
INTRAVENOUS | Status: DC | PRN
  Administered 2018-04-01: 200 mg via INTRAVENOUS

## 2018-04-01 MED ORDER — FENTANYL CITRATE (PF) 250 MCG/5ML IJ SOLN
INTRAMUSCULAR | Status: AC
  Filled 2018-04-01: qty 5

## 2018-04-01 MED ORDER — METHOCARBAMOL 500 MG PO TABS
500.0000 mg | ORAL_TABLET | Freq: Four times a day (QID) | ORAL | Status: DC | PRN
  Administered 2018-04-01 (×2): 500 mg via ORAL
  Filled 2018-04-01: qty 1

## 2018-04-01 MED ORDER — LISINOPRIL-HYDROCHLOROTHIAZIDE 10-12.5 MG PO TABS: 1.0000 | ORAL_TABLET | Freq: Every day | ORAL | Status: DC

## 2018-04-01 MED ORDER — METHOCARBAMOL 500 MG PO TABS
ORAL_TABLET | ORAL | Status: AC
  Filled 2018-04-01: qty 1

## 2018-04-01 MED ORDER — MORPHINE SULFATE (PF) 2 MG/ML IV SOLN: 2.0000 mg | INTRAVENOUS | Status: DC | PRN

## 2018-04-01 MED ORDER — HYDROCODONE-ACETAMINOPHEN 5-325 MG PO TABS
2.0000 | ORAL_TABLET | ORAL | Status: DC | PRN
  Administered 2018-04-01 – 2018-04-02 (×5): 2 via ORAL
  Filled 2018-04-01 (×5): qty 2

## 2018-04-01 MED ORDER — SODIUM CHLORIDE 0.9 % IV SOLN: 250.0000 mL | INTRAVENOUS | Status: DC

## 2018-04-01 SURGICAL SUPPLY — 61 items
ADH SKN CLS APL DERMABOND .7 (GAUZE/BANDAGES/DRESSINGS) ×1
BLADE CLIPPER SURG (BLADE) IMPLANT
BUR MATCHSTICK NEURO 3.0 LAGG (BURR) ×2 IMPLANT
BUR ROUND FLUTED 5 RND (BURR) ×2 IMPLANT
CANISTER SUCT 3000ML PPV (MISCELLANEOUS) ×2 IMPLANT
CARTRIDGE OIL MAESTRO DRILL (MISCELLANEOUS) ×1 IMPLANT
COVER WAND RF STERILE (DRAPES) ×2 IMPLANT
DECANTER SPIKE VIAL GLASS SM (MISCELLANEOUS) ×2 IMPLANT
DERMABOND ADVANCED (GAUZE/BANDAGES/DRESSINGS) ×1
DERMABOND ADVANCED .7 DNX12 (GAUZE/BANDAGES/DRESSINGS) ×1 IMPLANT
DIFFUSER DRILL AIR PNEUMATIC (MISCELLANEOUS) ×2 IMPLANT
DRAPE LAPAROTOMY 100X72X124 (DRAPES) ×2 IMPLANT
DRAPE MICROSCOPE LEICA (MISCELLANEOUS) ×2 IMPLANT
DRAPE SURG 17X23 STRL (DRAPES) ×2 IMPLANT
DRSG OPSITE POSTOP 3X4 (GAUZE/BANDAGES/DRESSINGS) ×1 IMPLANT
DRSG OPSITE POSTOP 4X6 (GAUZE/BANDAGES/DRESSINGS) ×1 IMPLANT
DURAPREP 26ML APPLICATOR (WOUND CARE) ×2 IMPLANT
ELECT REM PT RETURN 9FT ADLT (ELECTROSURGICAL) ×2
ELECTRODE REM PT RTRN 9FT ADLT (ELECTROSURGICAL) ×1 IMPLANT
GAUZE 4X4 16PLY RFD (DISPOSABLE) IMPLANT
GAUZE SPONGE 4X4 12PLY STRL (GAUZE/BANDAGES/DRESSINGS) IMPLANT
GLOVE BIO SURGEON STRL SZ8 (GLOVE) ×2 IMPLANT
GLOVE BIOGEL PI IND STRL 7.0 (GLOVE) IMPLANT
GLOVE BIOGEL PI IND STRL 7.5 (GLOVE) IMPLANT
GLOVE BIOGEL PI IND STRL 8 (GLOVE) ×1 IMPLANT
GLOVE BIOGEL PI IND STRL 8.5 (GLOVE) ×1 IMPLANT
GLOVE BIOGEL PI INDICATOR 7.0 (GLOVE) ×1
GLOVE BIOGEL PI INDICATOR 7.5 (GLOVE) ×1
GLOVE BIOGEL PI INDICATOR 8 (GLOVE) ×1
GLOVE BIOGEL PI INDICATOR 8.5 (GLOVE) ×1
GLOVE ECLIPSE 8.0 STRL XLNG CF (GLOVE) ×2 IMPLANT
GLOVE EXAM NITRILE XL STR (GLOVE) IMPLANT
GLOVE SURG SS PI 7.0 STRL IVOR (GLOVE) ×4 IMPLANT
GOWN STRL REUS W/ TWL LRG LVL3 (GOWN DISPOSABLE) IMPLANT
GOWN STRL REUS W/ TWL XL LVL3 (GOWN DISPOSABLE) ×1 IMPLANT
GOWN STRL REUS W/TWL 2XL LVL3 (GOWN DISPOSABLE) ×2 IMPLANT
GOWN STRL REUS W/TWL LRG LVL3 (GOWN DISPOSABLE) ×2
GOWN STRL REUS W/TWL XL LVL3 (GOWN DISPOSABLE) ×2
HEMOSTAT POWDER KIT SURGIFOAM (HEMOSTASIS) ×2 IMPLANT
KIT BASIN OR (CUSTOM PROCEDURE TRAY) ×2 IMPLANT
KIT TURNOVER KIT B (KITS) ×2 IMPLANT
NDL HYPO 18GX1.5 BLUNT FILL (NEEDLE) IMPLANT
NDL HYPO 25X1 1.5 SAFETY (NEEDLE) ×1 IMPLANT
NDL SPNL 18GX3.5 QUINCKE PK (NEEDLE) ×1 IMPLANT
NEEDLE HYPO 18GX1.5 BLUNT FILL (NEEDLE) IMPLANT
NEEDLE HYPO 25X1 1.5 SAFETY (NEEDLE) ×2 IMPLANT
NEEDLE SPNL 18GX3.5 QUINCKE PK (NEEDLE) ×2 IMPLANT
NS IRRIG 1000ML POUR BTL (IV SOLUTION) ×2 IMPLANT
OIL CARTRIDGE MAESTRO DRILL (MISCELLANEOUS) ×2
PACK LAMINECTOMY NEURO (CUSTOM PROCEDURE TRAY) ×2 IMPLANT
PAD ARMBOARD 7.5X6 YLW CONV (MISCELLANEOUS) ×6 IMPLANT
RUBBERBAND STERILE (MISCELLANEOUS) ×4 IMPLANT
SPONGE SURGIFOAM ABS GEL SZ50 (HEMOSTASIS) IMPLANT
SUT VIC AB 0 CT1 18XCR BRD8 (SUTURE) ×1 IMPLANT
SUT VIC AB 0 CT1 8-18 (SUTURE) ×2
SUT VIC AB 2-0 CT1 18 (SUTURE) ×2 IMPLANT
SUT VIC AB 3-0 SH 8-18 (SUTURE) ×2 IMPLANT
SYR 5ML LL (SYRINGE) IMPLANT
TOWEL GREEN STERILE (TOWEL DISPOSABLE) ×2 IMPLANT
TOWEL GREEN STERILE FF (TOWEL DISPOSABLE) ×2 IMPLANT
WATER STERILE IRR 1000ML POUR (IV SOLUTION) ×2 IMPLANT

## 2018-04-01 NOTE — Anesthesia Postprocedure Evaluation (Signed)
Anesthesia Post Note  Patient: [name]  Procedure(s) Performed: Left Lumbar four-lumbar five Microdiscectomy (Left Spine Lumbar)     Patient location during evaluation: PACU Anesthesia Type: General Level of consciousness: awake and alert Pain management: pain level controlled Vital Signs Assessment: post-procedure vital signs reviewed and stable Respiratory status: spontaneous breathing, nonlabored ventilation and respiratory function stable Cardiovascular status: blood pressure returned to baseline and stable Postop Assessment: no apparent nausea or vomiting Anesthetic complications: no    Last Vitals:  Vitals:   04/01/18 1151 04/01/18 1152  BP:  (!) 126/57  Pulse: (!) 59   Resp: 19   Temp: (!) 36.2 C   SpO2: 98%     Last Pain:  Vitals:   04/01/18 1151  TempSrc:   PainSc: 3                  Lidia Collum

## 2018-04-01 NOTE — Brief Op Note (Signed)
04/01/2018  10:58 AM  PATIENT:  [name]  73 y.o. 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, spondylosis, radiculopathy, lumbago  PROCEDURE:  Procedure(s): Left Lumbar four-lumbar five Microdiscectomy (Left) with microdissection  SURGEON:  Surgeon(s) and Role:    Erline Levine, MD - Primary    * Jovita Gamma, MD - Assisting  PHYSICIAN ASSISTANT:   ASSISTANTS: Poteat, RN   ANESTHESIA:   general  EBL:  20 mL   BLOOD ADMINISTERED:none  DRAINS: none   LOCAL MEDICATIONS USED:  MARCAINE    and LIDOCAINE   SPECIMEN:  No Specimen  DISPOSITION OF SPECIMEN:  N/A  COUNTS:  YES  TOURNIQUET:  * No tourniquets in log *  DICTATION: Patient has an L 45 disc rupture on the left with significant right leg weakness. It was elected to take him to surgery for left L 45 microdiscectomy.  Procedure: Patient was brought to the operating room and following the smooth and uncomplicated induction of general endotracheal anesthesia he was placed in a prone position on the Wilson frame. Low back was prepped and draped in the usual sterile fashion with betadine scrub and DuraPrep. Preoperative localizing X ray was obtained with a spinal needle.  Area of planned incision was infiltrated with local lidocaine. Incision was made in the midline and carried to the lumbodorsal fascia which was incised on the left side of midline. Subperiosteal dissection was performed exposing what was felt to be L 45 level. Intraoperative x-ray demonstrated marker probe at L 45.  A hemi-semi-laminectomy of L 4 was performed a high-speed drill and completed with Kerrison rongeurs and a generous foraminotomy was performed overlying the superior aspect of the L 5 lamina. Ligamentum flavum was detached and removed in a piecemeal fashion and the L 5  nerve root was decompressed laterally with removal of the superior aspect of the facet and ligamentum causing nerve root compression. The microscope was brought into the field and the L 5 nerve root was mobilized medially. This exposed a subligamentous disc herniation, including a caudally migrated fragment. Multiple fragments were removed and these did not extend into the interspace. As a result it was elected not to further decompress the interspace. The redundant annulus was also removed with 2 mm Kerrison rongeur.  At this point it was felt that all neural elements were well decompressed and there was no evidence of residual loose disc material within the interspace. The interspace was then irrigated with saline and no additional disc material was mobilized. Hemostasis was assured with bipolar electrocautery and the interspace was irrigated with Depo-Medrol and fentanyl. The lumbodorsal fascia was closed with 0 Vicryl sutures the subcutaneous tissues reapproximated 2-0 Vicryl inverted sutures and the skin edges were reapproximated with 3-0 Vicryl subcuticular stitch. The wound is dressed with Dermabond and an occlusive dressing. Patient was extubated in the operating room and taken to recovery in stable and satisfactory condition having tolerated his operation well counts were correct at the end of the case.   PLAN OF CARE: Admit for overnight observation  PATIENT DISPOSITION:  PACU - hemodynamically stable.   Delay start of Pharmacological VTE agent (>24hrs) due to surgical blood loss or risk of bleeding: yes

## 2018-04-01 NOTE — Transfer of Care (Signed)
Immediate Anesthesia Transfer of Care Note  Patient: [name]  Procedure(s) Performed: Left Lumbar four-lumbar five Microdiscectomy (Left Spine Lumbar)  Patient Location: PACU  Anesthesia Type:General  Level of Consciousness: awake and drowsy  Airway & Oxygen Therapy: Patient Spontanous Breathing and Patient connected to nasal cannula oxygen  Post-op Assessment: Report given to RN and Post -op Vital signs reviewed and stable  Post vital signs: Reviewed and stable  Last Vitals:  Vitals Value Taken Time  BP 112/73 04/01/2018 11:06 AM  Temp    Pulse 69 04/01/2018 11:09 AM  Resp 12 04/01/2018 11:09 AM  SpO2 99 % 04/01/2018 11:09 AM  Vitals shown include unvalidated device data.  Last Pain:  Vitals:   04/01/18 0806  TempSrc: Oral  PainSc: 3       Patients Stated Pain Goal: 1 (07/57/32 2567)  Complications: No apparent anesthesia complications

## 2018-04-01 NOTE — Anesthesia Procedure Notes (Signed)
Procedure Name: Intubation Date/Time: 04/01/2018 9:51 AM Performed by: Inda Coke, CRNA Pre-anesthesia Checklist: Patient identified, Emergency Drugs available, Suction available and Patient being monitored Patient Re-evaluated:Patient Re-evaluated prior to induction Oxygen Delivery Method: Circle System Utilized Preoxygenation: Pre-oxygenation with 100% oxygen Induction Type: IV induction Ventilation: Mask ventilation without difficulty Laryngoscope Size: Mac and 4 Grade View: Grade I Tube type: Oral Tube size: 7.5 mm Number of attempts: 1 Airway Equipment and Method: Stylet Placement Confirmation: ETT inserted through vocal cords under direct vision,  positive ETCO2 and breath sounds checked- equal and bilateral Secured at: 22 cm Tube secured with: Tape Dental Injury: Teeth and Oropharynx as per pre-operative assessment

## 2018-04-01 NOTE — H&P (Signed)
Patient ID:   [id number] Patient: [name]  Date of Birth: Jul 16, 1945 Visit Type: Office Visit   Date: 03/03/2018 01:15 PM Provider: Marchia Meiers. Vertell Limber MD   This 73 year old male presents for back pain.  HISTORY OF PRESENT ILLNESS: 1.  back pain  Patient returns to review his MRI. Patient notes that his left hip and left lower extremity pain has improved (been reduced) over the last 1-2 weeks. Patient currently rates his pain at 3/10. Patient pain radiates along the lateral side of his left lower extremity never into his foot.  02/16/18 L-spine MRI without contrast The dominant left-sided finding is at L4-5, where a leftward disc extrusion results in significant subarticular zone narrowing and compression of the left L5 nerve root. Posterior element hypertrophy is superimposed, contributing to mild stenosis. Similar less severe central and leftward protrusion at L3-4. Left L4 neural impingement. Foraminal disc material on the left does not clearly affect the L3 nerve root. 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      Medical/Surgical/Interim History Reviewed, no change.  Last detailed document date:05/01/2017.     Family History: Reviewed, no changes.  Last detailed document date:05/01/2017.   Social History: Reviewed, no changes. Last detailed document date: 05/01/2017.    MEDICATIONS: (added, continued or stopped this visit) Started Medication Directions Instruction Stopped  ascorbic acid (vitamin C) 1,000 mg tablet as directed    Calcium 500 mg (1,250 mg) + D3 125 unit tablet as directed   02/08/2018 hydrocodone 5 mg-acetaminophen 325 mg tablet take 1 tablet by oral route  every 6 hours as needed for pain    lisinopril 20 mg-hydrochlorothiazide 12.5 mg tablet take 1 tablet by oral route  every day    multivitamin tablet take 1 tablet by oral route  every day    simvastatin 20 mg  tablet take 1 tablet by oral route  every day in the evening    zolpidem ER 6.25 mg tablet,extended release,multiphase take 1 tablet by oral route  every day at bedtime      ALLERGIES: Ingredient Reaction Medication Name Comment NO KNOWN ALLERGIES    No known allergies.    PHYSICAL EXAM:  Vitals Date Temp F BP Pulse Ht In Wt Lb BMI BSA Pain Score 03/03/2018  122/73 77 71 181 25.24  2/10     IMPRESSION:  Upon examination, 4/5 EHL on left, 5/5 EHL on right, negative SLR on right, positive SLR on left at 45 degrees, 4/5 left hip abductor, 5/5 right hip abductor, no weakness during bilateral dips noted.  L-spine MRI without contrast reveals left-sided disc protrusion at L4-5 which results in sub articular zone narrowing and left L5 nerve root compression, disc bulge at L3-4 has not appreciably worsened - recommended surgical intervention as patient is experiencing weakness - recommended left L4-5 microdiscectomy - advised against surgical intervention at L3-4 as this level is unlikely to be significantly contributing to patient's symptoms and his exam findings are most consistent with L5 nerve root compression. Patient will follow-up after surgery for post op.  Bilateral hip X-rays reveal some osseous spurring of the right hip, no significant causes for concern - discussed with patient that his hip pain is likely sciatic pain and not from his hips. Advised patient that a referral to an orthopedist for his hips is unnecessary.   PLAN: 1. Nurse education given 2. Left L4-5 microdiscectomy scheduled 3. Follow-up after surgery  Orders: Instruction(s)/Education: Assessment  Instruction I10 Hypertension education Z68.25 Lifestyle education regarding diet  Completed Orders (this encounter) Order Details Reason Side Interpretation Result Initial Treatment Date Region Hypertension education Patient to follow up with primary care  provider       Lifestyle education regarding diet Patient encouraged to eat a well balance diet        Assessment/Plan  # Detail Type Description  1. Assessment Lumbar radiculopathy (M54.16).     2. Assessment Herniated nucleus pulposus, L4-5 left (M51.26).     3. Assessment Essential (primary) hypertension (I10).     4. Assessment Body mass index (BMI) 25.0-25.9, adult (P53.74).  Plan Orders Today's instructions / counseling include(s) Lifestyle education regarding diet. Clinical information/comments: Patient encouraged to eat a well balance diet.       Pain Management Plan Pain Scale: 2/10. Method: Numeric Pain Intensity Scale. Location: back. Onset: 12/02/2016. Duration: varies. Quality: discomforting. Pain management follow-up plan of care: Patient taking medication as prescribed.  Fall Risk Plan The patient has not fallen in the last year.              Provider:  Marchia Meiers. Vertell Limber MD  03/03/2018 10:18 PM Dictation edited by: [name]    CC Providers: [facility] [place],  [place]  [id number]   William Caffrey  [address] [place], [place] [address]-               Electronically signed by Marchia Meiers Vertell Limber MD on 03/06/2018 12:53 PM

## 2018-04-01 NOTE — Op Note (Signed)
04/01/2018  10:58 AM  PATIENT:  [name]  73 y.o. 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, spondylosis, radiculopathy, lumbago  PROCEDURE:  Procedure(s): Left Lumbar four-lumbar five Microdiscectomy (Left) with microdissection  SURGEON:  Surgeon(s) and Role:    Erline Levine, MD - Primary    * Jovita Gamma, MD - Assisting  PHYSICIAN ASSISTANT:   ASSISTANTS: Poteat, RN   ANESTHESIA:   general  EBL:  20 mL   BLOOD ADMINISTERED:none  DRAINS: none   LOCAL MEDICATIONS USED:  MARCAINE    and LIDOCAINE   SPECIMEN:  No Specimen  DISPOSITION OF SPECIMEN:  N/A  COUNTS:  YES  TOURNIQUET:  * No tourniquets in log *  DICTATION: Patient has an L 45 disc rupture on the left with significant right leg weakness. It was elected to take him to surgery for left L 45 microdiscectomy.  Procedure: Patient was brought to the operating room and following the smooth and uncomplicated induction of general endotracheal anesthesia he was placed in a prone position on the Wilson frame. Low back was prepped and draped in the usual sterile fashion with betadine scrub and DuraPrep. Preoperative localizing X ray was obtained with a spinal needle.  Area of planned incision was infiltrated with local lidocaine. Incision was made in the midline and carried to the lumbodorsal fascia which was incised on the left side of midline. Subperiosteal dissection was performed exposing what was felt to be L 45 level. Intraoperative x-ray demonstrated marker probe at L 45.  A hemi-semi-laminectomy of L 4 was performed a high-speed drill and completed with Kerrison rongeurs and a generous foraminotomy was performed overlying the superior aspect of the L 5 lamina. Ligamentum flavum was detached and removed in a piecemeal fashion and the L 5  nerve root was decompressed laterally with removal of the superior aspect of the facet and ligamentum causing nerve root compression. The microscope was brought into the field and the L 5 nerve root was mobilized medially. This exposed a subligamentous disc herniation, including a caudally migrated fragment. Multiple fragments were removed and these did not extend into the interspace. As a result it was elected not to further decompress the interspace. The redundant annulus was also removed with 2 mm Kerrison rongeur.  At this point it was felt that all neural elements were well decompressed and there was no evidence of residual loose disc material within the interspace. The interspace was then irrigated with saline and no additional disc material was mobilized. Hemostasis was assured with bipolar electrocautery and the interspace was irrigated with Depo-Medrol and fentanyl. The lumbodorsal fascia was closed with 0 Vicryl sutures the subcutaneous tissues reapproximated 2-0 Vicryl inverted sutures and the skin edges were reapproximated with 3-0 Vicryl subcuticular stitch. The wound is dressed with Dermabond and an occlusive dressing. Patient was extubated in the operating room and taken to recovery in stable and satisfactory condition having tolerated his operation well counts were correct at the end of the case.   PLAN OF CARE: Admit for overnight observation  PATIENT DISPOSITION:  PACU - hemodynamically stable.   Delay start of Pharmacological VTE agent (>24hrs) due to surgical blood loss or risk of bleeding: yes

## 2018-04-01 NOTE — Progress Notes (Addendum)
Patient ID: [name], male   DOB: 01/03/1946, 73 y.o.   MRN: [id number] Alert, conversant, without c/o pain at present. Mild incisional pain responded favorably to po pain med. Good strength BLE. Incision without erythema, swelling or drainage beneath honeycomb and Dermabond. Petechiae left hand dorsal aspect only, and only including proximal phalanges. Will monitor. No edema and no skin breakdown.

## 2018-04-02 ENCOUNTER — Encounter (HOSPITAL_COMMUNITY): Payer: Self-pay | Admitting: Neurosurgery

## 2018-04-02 DIAGNOSIS — E785 Hyperlipidemia, unspecified: Secondary | ICD-10-CM | POA: Diagnosis not present

## 2018-04-02 DIAGNOSIS — M5116 Intervertebral disc disorders with radiculopathy, lumbar region: Secondary | ICD-10-CM | POA: Diagnosis not present

## 2018-04-02 DIAGNOSIS — M48061 Spinal stenosis, lumbar region without neurogenic claudication: Secondary | ICD-10-CM | POA: Diagnosis not present

## 2018-04-02 DIAGNOSIS — Z79899 Other long term (current) drug therapy: Secondary | ICD-10-CM | POA: Diagnosis not present

## 2018-04-02 DIAGNOSIS — I10 Essential (primary) hypertension: Secondary | ICD-10-CM | POA: Diagnosis not present

## 2018-04-02 MED ORDER — METHOCARBAMOL 500 MG PO TABS: 500.0000 mg | ORAL_TABLET | Freq: Four times a day (QID) | ORAL | 1 refills | Status: DC | PRN

## 2018-04-02 MED ORDER — HYDROCODONE-ACETAMINOPHEN 5-325 MG PO TABS: 1.0000 | ORAL_TABLET | ORAL | 0 refills | Status: DC | PRN

## 2018-04-02 NOTE — Evaluation (Signed)
Occupational Therapy Evaluation Patient Details Name: [name] MRN: [id number] DOB: [birth year] Today's Date: 04/02/2018    History of Present Illness Pt is a 73 y/o male now s/pLeft Lumbar four-lumbar five Microdiscectomy (Left) with microdissection. Pt with PMHx including arthritis, Hypertension, Prostate cancer, and Tremor.   Clinical Impression   This 73 y/o male presents with the above. PTA pt reports independence with ADL and functional mobility. Pt demonstrating functional mobility without AD and overall supervision this session. He currently requires modA for LB ADL secondary to difficulty accessing LEs, able to perform UB ADL with setup-mod independence. Educated both pt and pt's spouse re: back precautions, safety and compensatory strategies for performing ADL and functional transfers with both parties verbalizing understanding. Spouse reports she and additional family members will be able to assist with ADL/iADL needs at time of discharge PRN. Questions answered throughout with no further acute OT needs identified at this time. Feel pt is safe to return home once medically ready given available family assist. Acute OT to sign off, thank you for this referral.     Follow Up Recommendations  No OT follow up;Supervision/Assistance - 24 hour(24hr initially)    Equipment Recommendations  None recommended by OT(pt spouse reports will purchase shower chair)           Precautions / Restrictions Precautions Precautions: Back Precaution Booklet Issued: Yes (comment) Precaution Comments: reviewed 3/3 back precautions Required Braces or Orthoses: (no brace needed per order set) Restrictions Weight Bearing Restrictions: No      Mobility Bed Mobility Overal bed mobility: Needs Assistance Bed Mobility: Rolling;Sidelying to Sit;Sit to Sidelying Rolling: Supervision Sidelying to sit: Supervision     Sit to sidelying: Min guard;Min assist General bed mobility comments: pt  practicing x2 times during session per pt request, completing from simulated bed height as pt's bed at home, initially minA to bring LEs onto EOB when transitioning to sidelying progressed to minguard  Transfers Overall transfer level: Needs assistance Equipment used: None Transfers: Sit to/from Stand Sit to Stand: Supervision         General transfer comment: for general safety and immediate standing balance    Balance Overall balance assessment: No apparent balance deficits (not formally assessed)                                         ADL either performed or assessed with clinical judgement   ADL Overall ADL's : Needs assistance/impaired Eating/Feeding: Independent;Sitting   Grooming: Supervision/safety;Standing   Upper Body Bathing: Supervision/ safety;Sitting   Lower Body Bathing: Minimal assistance;Sit to/from stand Lower Body Bathing Details (indicate cue type and reason): educated on use of shower chair for increased safety with bathing ADL, educated in use of LH sponge as well; pt spouse reports she will obtain shower chair, declined need for 3:1  Upper Body Dressing : Set up;Sitting   Lower Body Dressing: Moderate assistance;Sit to/from stand Lower Body Dressing Details (indicate cue type and reason): to access LEs, supervision standing balance; pt reports will be able to assist with this task after return home Toilet Transfer: Supervision/safety;Ambulation Toilet Transfer Details (indicate cue type and reason): simulated via transfer to/from EOB Toileting- Clothing Manipulation and Hygiene: Min guard;Sit to/from stand       Functional mobility during ADLs: Min guard;Supervision/safety General ADL Comments: reviewed back precautions, safety and compensatory strategies for performing ADL and functional transfers during session with  pt and pt spouse verbalizing understanding     Vision         Perception     Praxis      Pertinent  Vitals/Pain Pain Assessment: Faces Faces Pain Scale: Hurts little more Pain Location: incisional Pain Descriptors / Indicators: Sore Pain Intervention(s): Limited activity within patient's tolerance;Monitored during session;Repositioned     Hand Dominance     Extremity/Trunk Assessment Upper Extremity Assessment Upper Extremity Assessment: LUE deficits/detail;Overall WFL for tasks assessed LUE Deficits / Details: pt reports baseline tremor (LUE > RUE)   Lower Extremity Assessment Lower Extremity Assessment: Overall WFL for tasks assessed;Defer to PT evaluation   Cervical / Trunk Assessment Cervical / Trunk Assessment: Other exceptions Cervical / Trunk Exceptions: s/p spinal surgery   Communication Communication Communication: No difficulties   Cognition Arousal/Alertness: Awake/alert Behavior During Therapy: WFL for tasks assessed/performed Overall Cognitive Status: Within Functional Limits for tasks assessed                                     General Comments       Exercises     Shoulder Instructions      Home Living Family/patient expects to be discharged to:: Private residence Living Arrangements: Spouse/significant other Available Help at Discharge: Family;Available 24 hours/day(available 24hr initially) Type of Home: House             Bathroom Shower/Tub: Occupational psychologist: Standard                Prior Functioning/Environment Level of Independence: Independent                 OT Problem List: Decreased range of motion;Decreased activity tolerance;Decreased knowledge of precautions;Pain      OT Treatment/Interventions:      OT Goals(Current goals can be found in the care plan section) Acute Rehab OT Goals Patient Stated Goal: home soon OT Goal Formulation: All assessment and education complete, DC therapy  OT Frequency:     Barriers to D/C:            Co-evaluation              AM-PAC OT "6  Clicks" Daily Activity     Outcome Measure Help from another person eating meals?: None Help from another person taking care of personal grooming?: None Help from another person toileting, which includes using toliet, bedpan, or urinal?: None Help from another person bathing (including washing, rinsing, drying)?: A Little Help from another person to put on and taking off regular upper body clothing?: None Help from another person to put on and taking off regular lower body clothing?: A Lot 6 Click Score: 21   End of Session Nurse Communication: Mobility status  Activity Tolerance: Patient tolerated treatment well Patient left: in bed;with call bell/phone within reach;with family/visitor present  OT Visit Diagnosis: Other abnormalities of gait and mobility (R26.89);Pain Pain - part of body: (back)                Time: 0938-1829 OT Time Calculation (min): 18 min Charges:  OT General Charges $OT Visit: 1 Visit OT Evaluation $OT Eval Low Complexity: 1 Low  Lou Cal, OT [facility] Pager [phone] Office [place] 04/02/2018, 9:23 AM

## 2018-04-02 NOTE — Discharge Summary (Signed)
Physician Discharge Summary  Patient ID: MARQUAL MI MRN: [id number] DOB/AGE: 1946/01/28 73 y.o.  Admit date: 04/01/2018 Discharge date: 04/02/2018  Admission Diagnoses: Herniated nucleus pulposus, Lumbar L 45 left with stenosis, spondylosis, radiculopathy, lumbago    Discharge Diagnoses: Herniated nucleus pulposus, Lumbar L 45 left with stenosis, spondylosis, radiculopathy, lumbago s/p Left Lumbar four-lumbar five Microdiscectomy (Left) with microdissection     Active Problems:   Herniated lumbar disc without myelopathy   Discharged Condition: good  Hospital Course: Rajendra Spiller was admitted for surgery with dx lumbar HNP and radiculopathy. Following uncomplicated microdiscectomy  Left L4-5, he recovered nicely and transferred to [facility] for nursing care and therapies. He is mobilizing nicely.  Consults: None  Significant Diagnostic Studies: radiology: X-Ray: intra-op  Treatments: surgery: Left Lumbar four-lumbar five Microdiscectomy (Left) with microdissection    Discharge Exam: Blood pressure 124/60, pulse 60, temperature 97.9 F (36.6 C), temperature source Oral, resp. rate 16, height 5\' 11"  (1.803 m), weight 82 kg, SpO2 98 %.  Alert, conversant. Incision without erythema, swelling or drainage beneath honeycomb and dermabond. Good strength BLE. Denies leg pain, numbness. Petechiae left hand, without swelling or skin breakdown. Pt knows to monitor.    Disposition: Discharge disposition: 01-Home or Self Care  Pt and wife verbalize understanding of d/c instructions. Rx's for Norco 5/325 and Robaxin 500mg  will be eRx'ed for prn home use. Office f/u in 3-4 weeks.        Discharge Instructions    Diet - low sodium heart healthy   Complete by:  As directed    Increase activity slowly   Complete by:  As directed      Allergies as of 04/02/2018   No Known Allergies     Medication List    TAKE these medications   acetaminophen 500 MG  tablet Commonly known as:  TYLENOL Take 1,000 mg by mouth every 6 (six) hours as needed for moderate pain or headache.   ascorbic acid 1000 MG tablet Commonly known as:  VITAMIN C Take 1,000 mg by mouth 3 (three) times a week.   CALCIUM 600+D3 PO Take 1 tablet by mouth daily.   HYDROcodone-acetaminophen 5-325 MG tablet Commonly known as:  NORCO/VICODIN Take 1-2 tablets by mouth every 4 (four) hours as needed for severe pain ((score 7 to 10)).   lisinopril-hydrochlorothiazide 10-12.5 MG tablet Commonly known as:  PRINZIDE,ZESTORETIC Take 1 tablet by mouth daily.   methocarbamol 500 MG tablet Commonly known as:  ROBAXIN Take 1 tablet (500 mg total) by mouth every 6 (six) hours as needed for muscle spasms.   MULTIPLE VITAMIN PO Take 1 tablet by mouth 4 (four) times a week.   simvastatin 20 MG tablet Commonly known as:  ZOCOR Take 20 mg by mouth at bedtime.   ZIMS MAX-FREEZE 3.7 % Gel Generic drug:  Menthol (Topical Analgesic) Apply 1 application topically daily as needed (for pain).   zolpidem 12.5 MG CR tablet Commonly known as:  AMBIEN CR Take 6.25 mg by mouth at bedtime.        Signed: [name], MD 04/02/2018, 7:59 AM

## 2018-04-02 NOTE — Discharge Instructions (Signed)

## 2018-04-02 NOTE — Evaluation (Signed)
Physical Therapy Evaluation and Discharge  Patient Details Name: [name] MRN: [id number] DOB: [birth year] Today's Date: 04/02/2018   History of Present Illness  Pt is a 73 y/o male now s/pLeft Lumbar four-lumbar five Microdiscectomy (Left) with microdissection. Pt with PMHx including arthritis, Hypertension, Prostate cancer, and Tremor.  Clinical Impression  Patient evaluated by Physical Therapy with no further acute PT needs identified. All education has been completed and the patient has no further questions. At the time of PT eval pt was able to perform transfers and ambulation with gross modified independence without an AD. Pt and wife were educated on precautions, car transfer, stair negotiation, activity progression and positioning recommendations. See below for any follow-up Physical Therapy or equipment needs. PT is signing off. Thank you for this referral.     Follow Up Recommendations No PT follow up;Supervision - Intermittent    Equipment Recommendations  None recommended by PT    Recommendations for Other Services       Precautions / Restrictions Precautions Precautions: Back Precaution Booklet Issued: Yes (comment) Precaution Comments: reviewed 3/3 back precautions Required Braces or Orthoses: (no brace needed per order set) Restrictions Weight Bearing Restrictions: No      Mobility  Bed Mobility Overal bed mobility: Needs Assistance Bed Mobility: Rolling;Sidelying to Sit;Sit to Sidelying Rolling: Supervision Sidelying to sit: Supervision     Sit to sidelying: Min guard;Min assist General bed mobility comments: Verbally reviewed log roll technique.   Transfers Overall transfer level: Needs assistance Equipment used: None Transfers: Sit to/from Stand Sit to Stand: Supervision;Modified independent (Device/Increase time)         General transfer comment: Sueprvision progressing to mod I by end of session. Pt demonstrating proper hand placement on seated  surface for safety.   Ambulation/Gait Ambulation/Gait assistance: Modified independent (Device/Increase time) Gait Distance (Feet): 250 Feet Assistive device: None Gait Pattern/deviations: Step-through pattern;Decreased stride length;Trunk flexed Gait velocity: Decreased Gait velocity interpretation: 1.31 - 2.62 ft/sec, indicative of limited community ambulator General Gait Details: Pt ambulating well without difficulty. No AD required and no overt LOB noted. Pt able to improve posture with VC's and maintain well throughout session.   Stairs Stairs: Yes Stairs assistance: Supervision Stair Management: One rail Left;Alternating pattern;Forwards Number of Stairs: 5 General stair comments: VC's for sequencing and general safety. No assist required.  Wheelchair Mobility    Modified Rankin (Stroke Patients Only)       Balance Overall balance assessment: No apparent balance deficits (not formally assessed)                                           Pertinent Vitals/Pain Pain Assessment: Faces Faces Pain Scale: Hurts a little bit Pain Location: incisional Pain Descriptors / Indicators: Sore Pain Intervention(s): Monitored during session    Home Living Family/patient expects to be discharged to:: Private residence Living Arrangements: Spouse/significant other Available Help at Discharge: Family;Available 24 hours/day(available 24hr initially) Type of Home: House Home Access: Stairs to enter Entrance Stairs-Rails: Left(Attached cabinet to hold to) Entrance Stairs-Number of Steps: 2 Home Layout: One level Home Equipment: None      Prior Function Level of Independence: Independent               Hand Dominance   Dominant Hand: Right    Extremity/Trunk Assessment   Upper Extremity Assessment Upper Extremity Assessment: LUE deficits/detail;RUE deficits/detail LUE Deficits / Details:  pt reports baseline tremor (LUE > RUE)    Lower Extremity  Assessment Lower Extremity Assessment: Overall WFL for tasks assessed    Cervical / Trunk Assessment Cervical / Trunk Assessment: Other exceptions Cervical / Trunk Exceptions: s/p spinal surgery  Communication   Communication: No difficulties  Cognition Arousal/Alertness: Awake/alert Behavior During Therapy: WFL for tasks assessed/performed Overall Cognitive Status: Within Functional Limits for tasks assessed                                        General Comments      Exercises     Assessment/Plan    PT Assessment Patent does not need any further PT services  PT Problem List         PT Treatment Interventions      PT Goals (Current goals can be found in the Care Plan section)  Acute Rehab PT Goals Patient Stated Goal: Home this morning PT Goal Formulation: All assessment and education complete, DC therapy    Frequency     Barriers to discharge        Co-evaluation               AM-PAC PT "6 Clicks" Mobility  Outcome Measure Help needed turning from your back to your side while in a flat bed without using bedrails?: None Help needed moving from lying on your back to sitting on the side of a flat bed without using bedrails?: None Help needed moving to and from a bed to a chair (including a wheelchair)?: None Help needed standing up from a chair using your arms (e.g., wheelchair or bedside chair)?: None Help needed to walk in hospital room?: None Help needed climbing 3-5 steps with a railing? : None 6 Click Score: 24    End of Session   Activity Tolerance: Patient tolerated treatment well Patient left: with call bell/phone within reach;with family/visitor present(Sitting EOB) Nurse Communication: Mobility status PT Visit Diagnosis: Pain;Other symptoms and signs involving the nervous system (R29.898) Pain - part of body: (Back)    Time: 0938-1829 PT Time Calculation (min) (ACUTE ONLY): 15 min   Charges:   PT Evaluation $PT Eval Low  Complexity: 1 Low          Rolinda Roan, PT, DPT Acute Rehabilitation Services Pager: [phone] Office: [phone]   Thelma Comp 04/02/2018, 9:48 AM

## 2018-04-02 NOTE — Progress Notes (Addendum)
Subjective: Patient reports "I'm not hurting. A little sore afetr walking but it goes away"  Objective: Vital signs in last 24 hours: Temp:  [97.2 F (36.2 C)-98.2 F (36.8 C)] 97.9 F (36.6 C) (02/07 0729) Pulse Rate:  [57-65] 60 (02/07 0729) Resp:  [16-20] 16 (02/07 0729) BP: (107-146)/(54-73) 124/60 (02/07 0729) SpO2:  [92 %-100 %] 98 % (02/07 0729) Weight:  [82 kg] 82 kg (02/06 0806)  Intake/Output from previous day: 02/06 0701 - 02/07 0700 In: 1730 [P.O.:580; I.V.:1150] Out: 20 [Blood:20] Intake/Output this shift: No intake/output data recorded.  Alert, conversant. Incision without erythema, swelling or drainage beneath honeycomb and dermabond. Good strength BLE. Denies leg pain, numbness. Petechiae left hand, without swelling or skin breakdown. Pt knows to monitor.  Lab Results: No results for input(s): WBC, HGB, HCT, PLT in the last 72 hours. BMET No results for input(s): NA, K, CL, CO2, GLUCOSE, BUN, CREATININE, CALCIUM in the last 72 hours.  Studies/Results: Dg Lumbar Spine 2-3 Views  Result Date: 04/01/2018 CLINICAL DATA:  Localization films. EXAM: LUMBAR SPINE - 2-3 VIEW COMPARISON:  MRI of the lumbosacral spine 02/16/2018 FINDINGS: Lateral radiographs of the lumbosacral spine demonstrate localization instrument and tissue retractor posterior to the L5 spinous process. IMPRESSION: Surgical instruments posterior to L5 spinous process. Electronically Signed   By: Fidela Salisbury M.D.   On: 04/01/2018 13:08    Assessment/Plan: Improved   LOS: 0 days  OK per DrStern to d/c to home. Pt and wife verbalize understanding of d/c instructions. Rx's for Norco 5/325 and Robaxin 500mg  will be eRx'ed for prn home use. Office f/u in 3-4 weeks.   Verdis Prime 04/02/2018, 7:47 AM  Patient is doing well.  Discharge home.

## 2018-04-02 NOTE — Progress Notes (Signed)
Patient alert and oriented, Mae's well, voiding adequate amount of urine, swallowing without difficulty, c/o soreness/ pain and meds given prior to discharged for ride and discomfort. Patient discharged home with family. Script and discharged instructions given to patient. Patient and family stated understanding of instructions given. Patient has an appointment with Dr. Vertell Limber

## 2018-04-29 ENCOUNTER — Encounter: Payer: Self-pay | Admitting: Neurology

## 2018-05-17 ENCOUNTER — Other Ambulatory Visit: Payer: Self-pay | Admitting: Neurosurgery

## 2018-05-17 ENCOUNTER — Ambulatory Visit: Payer: Medicare Other | Admitting: Neurology

## 2018-05-17 ENCOUNTER — Encounter (HOSPITAL_COMMUNITY): Payer: Self-pay | Admitting: Orthopedic Surgery

## 2018-05-17 DIAGNOSIS — G9600 Cerebrospinal fluid leak, unspecified: Secondary | ICD-10-CM | POA: Insufficient documentation

## 2018-05-17 DIAGNOSIS — G9782 Other postprocedural complications and disorders of nervous system: Secondary | ICD-10-CM | POA: Insufficient documentation

## 2018-05-17 NOTE — Progress Notes (Signed)
PCP - Antony Contras Cardiologist - Dr. Dorris Carnes  Chest x-ray - N/A EKG - 05-21-17 Stress Test - 05-09-17 ECHO - N/A Cardiac Cath - N/A   Patient denies shortness of breath, fever, cough and chest pain at PAT appointment  Patient verbalized understanding of instructions that were given to them over the phone.

## 2018-05-18 ENCOUNTER — Inpatient Hospital Stay (HOSPITAL_COMMUNITY)
Admission: RE | Admit: 2018-05-18 | Discharge: 2018-05-19 | DRG: 029 | Disposition: A | Discharge status: Home / Self Care | Payer: Medicare Other | Attending: Neurosurgery | Admitting: Neurosurgery

## 2018-05-18 ENCOUNTER — Encounter (HOSPITAL_COMMUNITY)
Admission: RE | Disposition: A | Discharge status: Home / Self Care | Payer: Self-pay | Source: Home / Self Care | Attending: Neurosurgery

## 2018-05-18 ENCOUNTER — Inpatient Hospital Stay (HOSPITAL_COMMUNITY): Payer: Medicare Other | Admitting: Critical Care Medicine

## 2018-05-18 ENCOUNTER — Encounter (HOSPITAL_COMMUNITY): Payer: Self-pay | Admitting: *Deleted

## 2018-05-18 ENCOUNTER — Other Ambulatory Visit: Payer: Self-pay

## 2018-05-18 DIAGNOSIS — Y838 Other surgical procedures as the cause of abnormal reaction of the patient, or of later complication, without mention of misadventure at the time of the procedure: Secondary | ICD-10-CM | POA: Diagnosis present

## 2018-05-18 DIAGNOSIS — G9600 Cerebrospinal fluid leak, unspecified: Secondary | ICD-10-CM

## 2018-05-18 DIAGNOSIS — T888XXA Other specified complications of surgical and medical care, not elsewhere classified, initial encounter: Secondary | ICD-10-CM | POA: Diagnosis not present

## 2018-05-18 DIAGNOSIS — G9782 Other postprocedural complications and disorders of nervous system: Principal | ICD-10-CM | POA: Diagnosis present

## 2018-05-18 DIAGNOSIS — I10 Essential (primary) hypertension: Secondary | ICD-10-CM | POA: Diagnosis present

## 2018-05-18 HISTORY — PX: REPAIR OF CEREBROSPINAL FLUID LEAK: SHX6322

## 2018-05-18 HISTORY — DX: Cerebrospinal fluid leak, unspecified: G96.00

## 2018-05-18 LAB — CBC
HCT: 42 % (ref 39.0–52.0)
Hemoglobin: 13.7 g/dL (ref 13.0–17.0)
MCH: 30 pg (ref 26.0–34.0)
MCHC: 32.6 g/dL (ref 30.0–36.0)
MCV: 91.9 fL (ref 80.0–100.0)
Platelets: 196 10*3/uL (ref 150–400)
RBC: 4.57 MIL/uL (ref 4.22–5.81)
RDW: 12.5 % (ref 11.5–15.5)
WBC: 7.1 10*3/uL (ref 4.0–10.5)
nRBC: 0 % (ref 0.0–0.2)

## 2018-05-18 LAB — BASIC METABOLIC PANEL
Anion gap: 11 (ref 5–15)
BUN: 24 mg/dL — ABNORMAL HIGH (ref 8–23)
CO2: 22 mmol/L (ref 22–32)
Calcium: 9.7 mg/dL (ref 8.9–10.3)
Chloride: 102 mmol/L (ref 98–111)
Creatinine, Ser: 1.06 mg/dL (ref 0.61–1.24)
GFR calc Af Amer: >60 mL/min (ref >60)
GFR calc non Af Amer: >60 mL/min (ref >60)
Glucose, Bld: 91 mg/dL (ref 70–99)
Potassium: 3.2 mmol/L — ABNORMAL LOW (ref 3.5–5.1)
Sodium: 135 mmol/L (ref 135–145)

## 2018-05-18 LAB — TYPE AND SCREEN
ABO/RH(D): A POS
Antibody Screen: NEGATIVE

## 2018-05-18 LAB — ABO/RH: ABO/RH(D): A POS

## 2018-05-18 SURGERY — REPAIR OF CEREBROSPINAL FLUID LEAK
Anesthesia: General

## 2018-05-18 MED ORDER — DOCUSATE SODIUM 100 MG PO CAPS
100.0000 mg | ORAL_CAPSULE | Freq: Two times a day (BID) | ORAL | Status: DC
  Administered 2018-05-18 – 2018-05-19 (×2): 100 mg via ORAL
  Filled 2018-05-18 (×2): qty 1

## 2018-05-18 MED ORDER — BISACODYL 10 MG RE SUPP
10.0000 mg | Freq: Every day | RECTAL | Status: DC | PRN
  Administered 2018-05-18: 10 mg via RECTAL
  Filled 2018-05-18 (×2): qty 1

## 2018-05-18 MED ORDER — ONDANSETRON HCL 4 MG/2ML IJ SOLN
INTRAMUSCULAR | Status: DC | PRN
  Administered 2018-05-18: 4 mg via INTRAVENOUS

## 2018-05-18 MED ORDER — SODIUM CHLORIDE 0.9% FLUSH
3.0000 mL | Freq: Two times a day (BID) | INTRAVENOUS | Status: DC
  Administered 2018-05-18 – 2018-05-19 (×3): 3 mL via INTRAVENOUS

## 2018-05-18 MED ORDER — SODIUM CHLORIDE 0.9 % IV SOLN
250.0000 mL | INTRAVENOUS | Status: DC
  Administered 2018-05-18: 250 mL via INTRAVENOUS

## 2018-05-18 MED ORDER — LIDOCAINE-EPINEPHRINE 1 %-1:100000 IJ SOLN
INTRAMUSCULAR | Status: DC | PRN
  Administered 2018-05-18: 5 mL

## 2018-05-18 MED ORDER — THROMBIN 5000 UNITS EX SOLR
OROMUCOSAL | Status: DC | PRN
  Administered 2018-05-18: 10:00:00 via TOPICAL

## 2018-05-18 MED ORDER — HYDROCODONE-ACETAMINOPHEN 5-325 MG PO TABS
2.0000 | ORAL_TABLET | ORAL | Status: DC | PRN
  Administered 2018-05-18 – 2018-05-19 (×3): 2 via ORAL
  Filled 2018-05-18 (×3): qty 2

## 2018-05-18 MED ORDER — HYDROMORPHONE HCL 1 MG/ML IJ SOLN
INTRAMUSCULAR | Status: AC
  Administered 2018-05-18: 0.5 mg via INTRAVENOUS
  Filled 2018-05-18: qty 1

## 2018-05-18 MED ORDER — PANTOPRAZOLE SODIUM 40 MG IV SOLR
40.0000 mg | Freq: Every day | INTRAVENOUS | Status: DC
  Administered 2018-05-18: 40 mg via INTRAVENOUS
  Filled 2018-05-18: qty 40

## 2018-05-18 MED ORDER — ONDANSETRON HCL 4 MG/2ML IJ SOLN: 4.0000 mg | Freq: Four times a day (QID) | INTRAMUSCULAR | Status: DC | PRN

## 2018-05-18 MED ORDER — PROPOFOL 10 MG/ML IV BOLUS
INTRAVENOUS | Status: AC
  Filled 2018-05-18: qty 20

## 2018-05-18 MED ORDER — ACETAMINOPHEN 10 MG/ML IV SOLN
INTRAVENOUS | Status: AC
  Filled 2018-05-18: qty 100

## 2018-05-18 MED ORDER — MEPERIDINE HCL 50 MG/ML IJ SOLN: 6.2500 mg | INTRAMUSCULAR | Status: DC | PRN

## 2018-05-18 MED ORDER — LACTATED RINGERS IV SOLN: INTRAVENOUS | Status: DC

## 2018-05-18 MED ORDER — ACETAMINOPHEN 325 MG PO TABS: 325.0000 mg | ORAL_TABLET | Freq: Once | ORAL | Status: DC

## 2018-05-18 MED ORDER — PHENOL 1.4 % MT LIQD: 1.0000 | OROMUCOSAL | Status: DC | PRN

## 2018-05-18 MED ORDER — HYDROCODONE-ACETAMINOPHEN 5-325 MG PO TABS: 1.0000 | ORAL_TABLET | ORAL | Status: DC | PRN

## 2018-05-18 MED ORDER — FENTANYL CITRATE (PF) 250 MCG/5ML IJ SOLN
INTRAMUSCULAR | Status: DC | PRN
  Administered 2018-05-18 (×2): 50 ug via INTRAVENOUS

## 2018-05-18 MED ORDER — LORAZEPAM 0.5 MG PO TABS
0.5000 mg | ORAL_TABLET | Freq: Every evening | ORAL | Status: DC | PRN
  Administered 2018-05-18: 0.5 mg via ORAL
  Filled 2018-05-18: qty 1

## 2018-05-18 MED ORDER — GLYCOPYRROLATE PF 0.2 MG/ML IJ SOSY
PREFILLED_SYRINGE | INTRAMUSCULAR | Status: DC | PRN
  Administered 2018-05-18 (×2): .1 mg via INTRAVENOUS

## 2018-05-18 MED ORDER — LISINOPRIL-HYDROCHLOROTHIAZIDE 10-12.5 MG PO TABS: 1.0000 | ORAL_TABLET | Freq: Every day | ORAL | Status: DC

## 2018-05-18 MED ORDER — ALUM & MAG HYDROXIDE-SIMETH 200-200-20 MG/5ML PO SUSP: 30.0000 mL | Freq: Four times a day (QID) | ORAL | Status: DC | PRN

## 2018-05-18 MED ORDER — CHLORHEXIDINE GLUCONATE CLOTH 2 % EX PADS: 6.0000 | MEDICATED_PAD | Freq: Once | CUTANEOUS | Status: DC

## 2018-05-18 MED ORDER — ACETAMINOPHEN 500 MG PO TABS: 1000.0000 mg | ORAL_TABLET | Freq: Four times a day (QID) | ORAL | Status: DC | PRN

## 2018-05-18 MED ORDER — LISINOPRIL 10 MG PO TABS
10.0000 mg | ORAL_TABLET | Freq: Every day | ORAL | Status: DC
  Administered 2018-05-19: 10 mg via ORAL
  Filled 2018-05-18: qty 1

## 2018-05-18 MED ORDER — CEFAZOLIN SODIUM-DEXTROSE 2-4 GM/100ML-% IV SOLN
2.0000 g | INTRAVENOUS | Status: AC
  Administered 2018-05-18: 2 g via INTRAVENOUS

## 2018-05-18 MED ORDER — CEFAZOLIN SODIUM-DEXTROSE 2-4 GM/100ML-% IV SOLN
2.0000 g | Freq: Three times a day (TID) | INTRAVENOUS | Status: AC
  Administered 2018-05-18: 2 g via INTRAVENOUS
  Filled 2018-05-18: qty 100

## 2018-05-18 MED ORDER — HYDROMORPHONE HCL 1 MG/ML IJ SOLN
INTRAMUSCULAR | Status: AC
  Filled 2018-05-18: qty 1

## 2018-05-18 MED ORDER — PROMETHAZINE HCL 25 MG/ML IJ SOLN: 6.2500 mg | INTRAMUSCULAR | Status: DC | PRN

## 2018-05-18 MED ORDER — ACETAMINOPHEN 160 MG/5ML PO SOLN: 325.0000 mg | Freq: Once | ORAL | Status: DC

## 2018-05-18 MED ORDER — BUPIVACAINE HCL (PF) 0.5 % IJ SOLN
INTRAMUSCULAR | Status: AC
  Filled 2018-05-18: qty 30

## 2018-05-18 MED ORDER — POLYETHYLENE GLYCOL 3350 17 G PO PACK
17.0000 g | PACK | Freq: Every day | ORAL | Status: DC | PRN
  Filled 2018-05-18: qty 1

## 2018-05-18 MED ORDER — ONDANSETRON HCL 4 MG PO TABS: 4.0000 mg | ORAL_TABLET | Freq: Four times a day (QID) | ORAL | Status: DC | PRN

## 2018-05-18 MED ORDER — METHOCARBAMOL 500 MG PO TABS
ORAL_TABLET | ORAL | Status: AC
  Filled 2018-05-18: qty 1

## 2018-05-18 MED ORDER — METHOCARBAMOL 1000 MG/10ML IJ SOLN
500.0000 mg | Freq: Four times a day (QID) | INTRAVENOUS | Status: DC | PRN
  Filled 2018-05-18: qty 5

## 2018-05-18 MED ORDER — SODIUM CHLORIDE 0.9 % IV SOLN
INTRAVENOUS | Status: DC | PRN
  Administered 2018-05-18: 25 ug/min via INTRAVENOUS

## 2018-05-18 MED ORDER — LACTATED RINGERS IV SOLN
INTRAVENOUS | Status: DC | PRN
  Administered 2018-05-18 (×2): via INTRAVENOUS

## 2018-05-18 MED ORDER — ACETAMINOPHEN 325 MG PO TABS: 650.0000 mg | ORAL_TABLET | ORAL | Status: DC | PRN

## 2018-05-18 MED ORDER — SUCCINYLCHOLINE CHLORIDE 200 MG/10ML IV SOSY
PREFILLED_SYRINGE | INTRAVENOUS | Status: DC | PRN
  Administered 2018-05-18: 120 mg via INTRAVENOUS

## 2018-05-18 MED ORDER — ADULT MULTIVITAMIN W/MINERALS CH
1.0000 | ORAL_TABLET | ORAL | Status: DC
  Administered 2018-05-19: 1 via ORAL
  Filled 2018-05-18: qty 1

## 2018-05-18 MED ORDER — LIDOCAINE 2% (20 MG/ML) 5 ML SYRINGE
INTRAMUSCULAR | Status: DC | PRN
  Administered 2018-05-18 (×2): 50 mg via INTRAVENOUS

## 2018-05-18 MED ORDER — BUPIVACAINE HCL (PF) 0.5 % IJ SOLN
INTRAMUSCULAR | Status: DC | PRN
  Administered 2018-05-18: 5 mL

## 2018-05-18 MED ORDER — 0.9 % SODIUM CHLORIDE (POUR BTL) OPTIME
TOPICAL | Status: DC | PRN
  Administered 2018-05-18: 1000 mL

## 2018-05-18 MED ORDER — LIDOCAINE-EPINEPHRINE 1 %-1:100000 IJ SOLN
INTRAMUSCULAR | Status: AC
  Filled 2018-05-18: qty 1

## 2018-05-18 MED ORDER — OXYCODONE HCL 5 MG PO TABS
5.0000 mg | ORAL_TABLET | ORAL | Status: DC | PRN
  Administered 2018-05-18 – 2018-05-19 (×2): 5 mg via ORAL
  Filled 2018-05-18 (×2): qty 1

## 2018-05-18 MED ORDER — MENTHOL (TOPICAL ANALGESIC) 3.7 % EX GEL: 1.0000 "application " | Freq: Every day | CUTANEOUS | Status: DC | PRN

## 2018-05-18 MED ORDER — ACETAMINOPHEN 650 MG RE SUPP: 650.0000 mg | RECTAL | Status: DC | PRN

## 2018-05-18 MED ORDER — SODIUM CHLORIDE 0.9% FLUSH: 3.0000 mL | INTRAVENOUS | Status: DC | PRN

## 2018-05-18 MED ORDER — SIMVASTATIN 20 MG PO TABS
20.0000 mg | ORAL_TABLET | Freq: Every day | ORAL | Status: DC
  Administered 2018-05-18: 20 mg via ORAL
  Filled 2018-05-18: qty 1

## 2018-05-18 MED ORDER — KCL IN DEXTROSE-NACL 20-5-0.45 MEQ/L-%-% IV SOLN
INTRAVENOUS | Status: DC
  Administered 2018-05-18 – 2018-05-19 (×2): via INTRAVENOUS
  Filled 2018-05-18: qty 1000

## 2018-05-18 MED ORDER — PHENYLEPHRINE 40 MCG/ML (10ML) SYRINGE FOR IV PUSH (FOR BLOOD PRESSURE SUPPORT)
PREFILLED_SYRINGE | INTRAVENOUS | Status: DC | PRN
  Administered 2018-05-18 (×2): 80 ug via INTRAVENOUS
  Administered 2018-05-18: 40 ug via INTRAVENOUS
  Administered 2018-05-18 (×3): 80 ug via INTRAVENOUS

## 2018-05-18 MED ORDER — ACETAMINOPHEN 10 MG/ML IV SOLN
1000.0000 mg | Freq: Once | INTRAVENOUS | Status: DC | PRN
  Administered 2018-05-18: 1000 mg via INTRAVENOUS

## 2018-05-18 MED ORDER — HYDROCHLOROTHIAZIDE 12.5 MG PO CAPS
12.5000 mg | ORAL_CAPSULE | Freq: Every day | ORAL | Status: DC
  Administered 2018-05-19: 12.5 mg via ORAL
  Filled 2018-05-18: qty 1

## 2018-05-18 MED ORDER — HYDROMORPHONE HCL 1 MG/ML IJ SOLN
0.2500 mg | INTRAMUSCULAR | Status: DC | PRN
  Administered 2018-05-18 (×4): 0.5 mg via INTRAVENOUS

## 2018-05-18 MED ORDER — CALCIUM CARB-CHOLECALCIFEROL 600-200 MG-UNIT PO TABS: ORAL_TABLET | ORAL | Status: DC

## 2018-05-18 MED ORDER — FLEET ENEMA 7-19 GM/118ML RE ENEM: 1.0000 | ENEMA | Freq: Once | RECTAL | Status: DC | PRN

## 2018-05-18 MED ORDER — PROPOFOL 10 MG/ML IV BOLUS
INTRAVENOUS | Status: DC | PRN
  Administered 2018-05-18: 130 mg via INTRAVENOUS

## 2018-05-18 MED ORDER — MENTHOL 3 MG MT LOZG: 1.0000 | LOZENGE | OROMUCOSAL | Status: DC | PRN

## 2018-05-18 MED ORDER — VITAMIN C 500 MG PO TABS
1000.0000 mg | ORAL_TABLET | ORAL | Status: DC
  Administered 2018-05-19: 1000 mg via ORAL
  Filled 2018-05-18: qty 2

## 2018-05-18 MED ORDER — MORPHINE SULFATE (PF) 2 MG/ML IV SOLN
2.0000 mg | INTRAVENOUS | Status: DC | PRN
  Administered 2018-05-18: 2 mg via INTRAVENOUS
  Filled 2018-05-18: qty 1

## 2018-05-18 MED ORDER — DEXAMETHASONE SODIUM PHOSPHATE 10 MG/ML IJ SOLN
INTRAMUSCULAR | Status: DC | PRN
  Administered 2018-05-18: 4 mg via INTRAVENOUS

## 2018-05-18 MED ORDER — ROCURONIUM BROMIDE 10 MG/ML (PF) SYRINGE
PREFILLED_SYRINGE | INTRAVENOUS | Status: DC | PRN
  Administered 2018-05-18: 30 mg via INTRAVENOUS

## 2018-05-18 MED ORDER — FENTANYL CITRATE (PF) 250 MCG/5ML IJ SOLN
INTRAMUSCULAR | Status: AC
  Filled 2018-05-18: qty 5

## 2018-05-18 MED ORDER — ZOLPIDEM TARTRATE 5 MG PO TABS
5.0000 mg | ORAL_TABLET | Freq: Every evening | ORAL | Status: DC | PRN
  Administered 2018-05-18: 5 mg via ORAL
  Filled 2018-05-18: qty 1

## 2018-05-18 MED ORDER — METHOCARBAMOL 500 MG PO TABS
500.0000 mg | ORAL_TABLET | Freq: Four times a day (QID) | ORAL | Status: DC | PRN
  Administered 2018-05-18 – 2018-05-19 (×4): 500 mg via ORAL
  Filled 2018-05-18 (×3): qty 1

## 2018-05-18 MED ORDER — ZOLPIDEM TARTRATE 5 MG PO TABS: 5.0000 mg | ORAL_TABLET | Freq: Every evening | ORAL | Status: DC | PRN

## 2018-05-18 MED ORDER — THROMBIN 5000 UNITS EX SOLR
CUTANEOUS | Status: AC
  Filled 2018-05-18: qty 5000

## 2018-05-18 MED ORDER — SUGAMMADEX SODIUM 200 MG/2ML IV SOLN
INTRAVENOUS | Status: DC | PRN
  Administered 2018-05-18: 160 mg via INTRAVENOUS

## 2018-05-18 SURGICAL SUPPLY — 52 items
ADH SKN CLS APL DERMABOND .7 (GAUZE/BANDAGES/DRESSINGS) ×1
BUR MATCHSTICK NEURO 3.0 LAGG (BURR) ×2 IMPLANT
CANISTER SUCT 3000ML PPV (MISCELLANEOUS) ×2 IMPLANT
CARTRIDGE OIL MAESTRO DRILL (MISCELLANEOUS) ×1 IMPLANT
COVER WAND RF STERILE (DRAPES) ×2 IMPLANT
DECANTER SPIKE VIAL GLASS SM (MISCELLANEOUS) ×2 IMPLANT
DERMABOND ADVANCED (GAUZE/BANDAGES/DRESSINGS) ×1
DERMABOND ADVANCED .7 DNX12 (GAUZE/BANDAGES/DRESSINGS) IMPLANT
DIFFUSER DRILL AIR PNEUMATIC (MISCELLANEOUS) ×2 IMPLANT
DRAPE LAPAROTOMY 100X72X124 (DRAPES) ×2 IMPLANT
DRAPE POUCH INSTRU U-SHP 10X18 (DRAPES) ×2 IMPLANT
DRAPE SURG 17X23 STRL (DRAPES) ×2 IMPLANT
DRSG OPSITE POSTOP 3X4 (GAUZE/BANDAGES/DRESSINGS) ×1 IMPLANT
DURAPREP 26ML APPLICATOR (WOUND CARE) ×2 IMPLANT
DURASEAL SPINE SEALANT 3ML (MISCELLANEOUS) IMPLANT
ELECT REM PT RETURN 9FT ADLT (ELECTROSURGICAL) ×2
ELECTRODE REM PT RTRN 9FT ADLT (ELECTROSURGICAL) ×1 IMPLANT
GAUZE 4X4 16PLY RFD (DISPOSABLE) IMPLANT
GAUZE SPONGE 4X4 12PLY STRL (GAUZE/BANDAGES/DRESSINGS) IMPLANT
GLOVE BIO SURGEON STRL SZ 6.5 (GLOVE) ×3 IMPLANT
GLOVE BIO SURGEON STRL SZ8 (GLOVE) ×2 IMPLANT
GLOVE BIOGEL PI IND STRL 8 (GLOVE) ×1 IMPLANT
GLOVE BIOGEL PI IND STRL 8.5 (GLOVE) ×1 IMPLANT
GLOVE BIOGEL PI INDICATOR 8 (GLOVE) ×1
GLOVE BIOGEL PI INDICATOR 8.5 (GLOVE) ×1
GLOVE ECLIPSE 8.0 STRL XLNG CF (GLOVE) ×2 IMPLANT
GLOVE EXAM NITRILE XL STR (GLOVE) IMPLANT
GOWN STRL REUS W/ TWL LRG LVL3 (GOWN DISPOSABLE) IMPLANT
GOWN STRL REUS W/ TWL XL LVL3 (GOWN DISPOSABLE) IMPLANT
GOWN STRL REUS W/TWL 2XL LVL3 (GOWN DISPOSABLE) IMPLANT
GOWN STRL REUS W/TWL LRG LVL3 (GOWN DISPOSABLE)
GOWN STRL REUS W/TWL XL LVL3 (GOWN DISPOSABLE)
KIT BASIN OR (CUSTOM PROCEDURE TRAY) ×2 IMPLANT
KIT TURNOVER KIT B (KITS) ×2 IMPLANT
NDL HYPO 25X5/8 SAFETYGLIDE (NEEDLE) ×1 IMPLANT
NEEDLE HYPO 25X5/8 SAFETYGLIDE (NEEDLE) ×2 IMPLANT
NS IRRIG 1000ML POUR BTL (IV SOLUTION) ×2 IMPLANT
OIL CARTRIDGE MAESTRO DRILL (MISCELLANEOUS) ×2
PACK LAMINECTOMY NEURO (CUSTOM PROCEDURE TRAY) ×2 IMPLANT
PAD ARMBOARD 7.5X6 YLW CONV (MISCELLANEOUS) ×2 IMPLANT
SEALANT ADHERUS EXTEND TIP (MISCELLANEOUS) ×3 IMPLANT
SPONGE SURGIFOAM ABS GEL SZ50 (HEMOSTASIS) IMPLANT
STAPLER SKIN PROX WIDE 3.9 (STAPLE) IMPLANT
SUT PROLENE 6 0 BV (SUTURE) ×2 IMPLANT
SUT VIC AB 0 CT1 18XCR BRD8 (SUTURE) ×1 IMPLANT
SUT VIC AB 0 CT1 8-18 (SUTURE) ×2
SUT VIC AB 2-0 CT1 18 (SUTURE) ×2 IMPLANT
SUT VIC AB 3-0 SH 8-18 (SUTURE) ×2 IMPLANT
TOWEL GREEN STERILE (TOWEL DISPOSABLE) ×2 IMPLANT
TOWEL GREEN STERILE FF (TOWEL DISPOSABLE) ×2 IMPLANT
TRAY FOLEY W/BAG SLVR 14FR (SET/KITS/TRAYS/PACK) ×1 IMPLANT
WATER STERILE IRR 1000ML POUR (IV SOLUTION) ×2 IMPLANT

## 2018-05-18 NOTE — Interval H&P Note (Signed)
History and Physical Interval Note:  05/18/2018 9:52 AM  [name]  has presented today for surgery, with the diagnosis of Post operative csf leak.  The various methods of treatment have been discussed with the patient and family. After consideration of risks, benefits and other options for treatment, the patient has consented to  Procedure(s) with comments: Exploration of Lumbar 4-5 with repair of cerebrospinal fluid leak (N/A) - Exploration of Lumbar 4-5 with repair of cerebrospinal fluid leak as a surgical intervention.  The patient's history has been reviewed, patient examined, no change in status, stable for surgery.  I have reviewed the patient's chart and labs.  Questions were answered to the patient's satisfaction.     [name]

## 2018-05-18 NOTE — Brief Op Note (Signed)
05/18/2018  11:37 AM  PATIENT:  [name]  73 y.o. male  PRE-OPERATIVE DIAGNOSIS:  Post operative cerebrospinal fluild leak  POST-OPERATIVE DIAGNOSIS:  Post operative cerebrospinal fluild leak  PROCEDURE:  Procedure(s): Exploration of Lumbar four-five with repair of cerebrospinal fluid leak (N/A)  SURGEON:  Surgeon(s) and Role:    Erline Levine, MD - Primary  PHYSICIAN ASSISTANT:   ASSISTANTS: Poteat, RN   ANESTHESIA:   general  EBL:  20 mL   BLOOD ADMINISTERED:none  DRAINS: none   LOCAL MEDICATIONS USED:  MARCAINE    and LIDOCAINE   SPECIMEN:  No Specimen  DISPOSITION OF SPECIMEN:  N/A  COUNTS:  YES  TOURNIQUET:  * No tourniquets in log *  DICTATION: Indications:  Patient underwent uncomplicated lumbar laminectomy and discectomy L 45 Left 5 weeks ago, from which he did well.  At approximately week 4 post op, patient developed a CSF leak.  He was brought to OR for wound exploration and repair of CSF leak.  Procedure:  Patient was brought to OR and intubated, then placed prone on Wilson frame.   Back was prepped and draped in usual fashion with betadine scrub and Duraprep.  Old incision was infiltrated with lidocaine.  Prior incision was reopened and laminectomy defect identified.  Microscope was used and a small linear laceration along the nerve root sheath was identified.  This was opposite a roughened edge of bone, but not spike or sharp edge was appreciated.Lateral laminotomy was extended with Kerrison rongeur.  This was slowly leaking CSF.  2 6-0 Prolene sutures were placed along with Adherus.  Prior to placing these grafts, Valsalva demonstrated no egress of CSF.  Wound was closed with 0, 2-0, 3-0 Vicryl sutures.  Sterile occlusive dressing was placed.  Patient was taken to Recovery having tolerated his procedure well.  A Foley catheter was placed for patient to lay flat until AM before liberalizing activity.  PLAN OF CARE: Admit for overnight  observation  PATIENT DISPOSITION:  PACU - hemodynamically stable.   Delay start of Pharmacological VTE agent (>24hrs) due to surgical blood loss or risk of bleeding: yes

## 2018-05-18 NOTE — Anesthesia Postprocedure Evaluation (Signed)
Anesthesia Post Note  Patient: OZELL JUHASZ  Procedure(s) Performed: Exploration of Lumbar four-five with repair of cerebrospinal fluid leak (N/A )     Patient location during evaluation: PACU Anesthesia Type: General Level of consciousness: awake and alert Pain management: pain level controlled Vital Signs Assessment: post-procedure vital signs reviewed and stable Respiratory status: spontaneous breathing, nonlabored ventilation, respiratory function stable and patient connected to nasal cannula oxygen Cardiovascular status: blood pressure returned to baseline and stable Postop Assessment: no apparent nausea or vomiting Anesthetic complications: no    Last Vitals:  Vitals:   05/18/18 1259 05/18/18 1300  BP:  118/65  Pulse:  (!) 52  Resp:    Temp: (!) 36.4 C   SpO2:  99%    Last Pain:  Vitals:   05/18/18 1259  TempSrc: Oral  PainSc:                  Effie Berkshire

## 2018-05-18 NOTE — Progress Notes (Signed)
PT Cancellation Note  Patient Details Name: [name] MRN: [id number] DOB: 13-Dec-1945   Cancelled Treatment:    Reason Eval/Treat Not Completed: Other (comment) Pt currently on flat bedrest per RN. Will hold until pt appropriate for increased activity and follow up as schedule allows.   Leighton Ruff, PT, DPT  Acute Rehabilitation Services  Pager: [phone] Office: [phone]  Rudean Hitt 05/18/2018, 2:05 PM

## 2018-05-18 NOTE — Plan of Care (Signed)
Just got to the unit, working on pain control but on flat bedrest.

## 2018-05-18 NOTE — Anesthesia Procedure Notes (Signed)
Procedure Name: Intubation Date/Time: 05/18/2018 10:25 AM Performed by: Wilburn Cornelia, CRNA Pre-anesthesia Checklist: Emergency Drugs available, Patient identified, Suction available, Patient being monitored and Timeout performed Patient Re-evaluated:Patient Re-evaluated prior to induction Oxygen Delivery Method: Circle system utilized Preoxygenation: Pre-oxygenation with 100% oxygen Induction Type: IV induction and Rapid sequence Laryngoscope Size: Mac and 4 Grade View: Grade I Tube size: 7.5 mm Number of attempts: 1 Airway Equipment and Method: Stylet Placement Confirmation: ETT inserted through vocal cords under direct vision,  positive ETCO2,  CO2 detector and breath sounds checked- equal and bilateral Secured at: 23 cm Tube secured with: Tape Dental Injury: Teeth and Oropharynx as per pre-operative assessment

## 2018-05-18 NOTE — Social Work (Signed)
CSW acknowledging consult for SNF placement. Will follow for therapy recommendations.   [name], MSW, LCSWA [place] Clinical Social Work [phone]   

## 2018-05-18 NOTE — Anesthesia Preprocedure Evaluation (Addendum)
Anesthesia Evaluation  Patient identified by MRN, date of birth, ID band Patient awake    Reviewed: Allergy & Precautions, NPO status , Patient's Chart, lab work & pertinent test results  Airway Mallampati: II  TM Distance: >3 FB Neck ROM: Full    Dental  (+) Teeth Intact, Dental Advisory Given   Pulmonary neg pulmonary ROS,    Pulmonary exam normal        Cardiovascular hypertension, Pt. on medications  Rhythm:Regular Rate:Normal     Neuro/Psych negative neurological ROS  negative psych ROS   GI/Hepatic negative GI ROS, Neg liver ROS,   Endo/Other  negative endocrine ROS  Renal/GU negative Renal ROS     Musculoskeletal  (+) Arthritis ,   Abdominal Normal abdominal exam  (+)   Peds  Hematology negative hematology ROS (+)   Anesthesia Other Findings   Reproductive/Obstetrics                            Lab Results  Component Value Date   WBC 5.8 03/24/2018   HGB 13.2 03/24/2018   HCT 40.7 03/24/2018   MCV 90.8 03/24/2018   PLT 183 03/24/2018   Lab Results  Component Value Date   CREATININE 0.99 03/24/2018   BUN 24 (H) 03/24/2018   NA 138 03/24/2018   K 3.8 03/24/2018   CL 102 03/24/2018   CO2 28 03/24/2018   No results found for: INR, PROTIME  EKG: normal sinus rhythm.  Anesthesia Physical Anesthesia Plan  ASA: II  Anesthesia Plan: General   Post-op Pain Management:    Induction: Intravenous  PONV Risk Score and Plan: 3 and Ondansetron and Dexamethasone  Airway Management Planned: Oral ETT  Additional Equipment: None  Intra-op Plan:   Post-operative Plan: Extubation in OR  Informed Consent: I have reviewed the patients History and Physical, chart, labs and discussed the procedure including the risks, benefits and alternatives for the proposed anesthesia with the patient or authorized representative who has indicated his/her understanding and acceptance.      Dental advisory given  Plan Discussed with: CRNA  Anesthesia Plan Comments:        Anesthesia Quick Evaluation

## 2018-05-18 NOTE — Progress Notes (Signed)
Pt complaining of not having a bowel movement 4 days prior to surgery.   Prune juice given with pain medication.  Suppository given, patient waiting to have a bowel movement.

## 2018-05-18 NOTE — H&P (Signed)
Patient ID:   [id number] Patient: [name]  Date of Birth: February 12, 1946 Visit Type: Office Visit   Date: 05/17/2018 02:15 PM Provider: Marchia Meiers. Vertell Limber MD   This 73 year old male presents for back pain.  HISTORY OF PRESENT ILLNESS:  1.  back pain  04/01/2018 left L4-5 microdiskectomy  Patient returns after calling to report persistent clear drainage from his incision that began 1 week ago.  He also notes increased left hip and low back pain x2 weeks. Inspection reveals well-approximated incision with pinhole opening, clear fluid slow but steady flow.  No erythema or pain.  Some swelling, soft to palpation suggests fluid beneath the incision. He denies any leg pain, or headache. He notes no cough or event that triggered the onset of this drainage.   Medrol Dosepak began 05/10/2018 Norco 5/325 with OTC Tylenol q.4 hours recently         Medical/Surgical/Interim History Reviewed, no change.  Last detailed document date:05/01/2017.     PAST MEDICAL HISTORY, SURGICAL HISTORY, FAMILY HISTORY, SOCIAL HISTORY AND REVIEW OF SYSTEMS I have reviewed the patient's past medical, surgical, family and social history as well as the comprehensive review of systems as included on the [facility] history form dated 06/04/2017, which I have signed.  Family History:  Reviewed, no changes.  Last detailed document date:05/01/2017.   Social History: Reviewed, no changes. Last detailed document date: 05/01/2017.    MEDICATIONS: (added, continued or stopped this visit) Started Medication Directions Instruction Stopped   ascorbic acid (vitamin C) 1,000 mg tablet as directed     Calcium 500 mg (1,250 mg) + D3 125 unit tablet as directed    02/08/2018 hydrocodone 5 mg-acetaminophen 325 mg tablet take 1 tablet by oral route  every 6 hours as needed for pain     lisinopril 20 mg-hydrochlorothiazide 12.5 mg tablet take 1 tablet by oral route  every day    05/10/2018  Medrol (Pak) 4 mg tablets in a dose pack take by Oral route as directed     multivitamin tablet take 1 tablet by oral route  every day    03/05/2018 Neurontin 300 mg capsule take 1 capsule by oral route 3 times every day     simvastatin 20 mg tablet take 1 tablet by oral route  every day in the evening     zolpidem ER 6.25 mg tablet,extended release,multiphase take 1 tablet by oral route  every day at bedtime       ALLERGIES: Ingredient Reaction Medication Name Comment  NO KNOWN ALLERGIES     No known allergies.    PHYSICAL EXAM:   Vitals Date Temp F BP Pulse Ht In Wt Lb BMI BSA Pain Score  05/17/2018  135/68 78 71 180 25.1  4/10      IMPRESSION:   Upon examination, patient is leaking clear fluid from his back from a small pinhole opening over his incision with some swelling present around the incision. No erythema noted. Rate of fluid drainage increases when pressure is applied near the opening. The drainage stops when the patient lays prone. purely clearly fluid dripping slowly that began 1 week ago, does no leak when patient is lying on his back. He denies any head aches but notes some left hip pain. Recommended exploration of lumbar spine to repair presumed CSF leak.   PLAN:  1. Exploration of lumbar spine to repair presumed CSF leak scheduled 2. Follow-up after   Assessment/Plan   # Detail Type  Description   1. Assessment Lumbar radiculopathy (M54.16).       2. Assessment Herniated nucleus pulposus, L4-5 left (M51.26).       3. Assessment Low back pain, unspecified back pain laterality, with sciatica presence unspecified (M54.5).       4. Assessment Postoperative CSF leak (G97.82).                     Provider:  Marchia Meiers. Vertell Limber MD  05/17/2018 02:44 PM    Dictation edited by: [name]    CC Providers: [facility] [place],  [place]  [id number]   William Caffrey  [address] [place],  [address]-               Electronically signed by Marchia Meiers Vertell Limber MD on 05/17/2018 02:44 PM

## 2018-05-18 NOTE — Transfer of Care (Signed)
Immediate Anesthesia Transfer of Care Note  Patient: [name]  Procedure(s) Performed: Exploration of Lumbar four-five with repair of cerebrospinal fluid leak (N/A )  Patient Location: PACU  Anesthesia Type:General  Level of Consciousness: awake and alert   Airway & Oxygen Therapy: Patient Spontanous Breathing and Patient connected to nasal cannula oxygen  Post-op Assessment: Report given to RN and Post -op Vital signs reviewed and stable  Post vital signs: Reviewed and stable  Last Vitals:  Vitals Value Taken Time  BP    Temp    Pulse 60 05/18/2018 11:45 AM  Resp 19 05/18/2018 11:45 AM  SpO2 100 % 05/18/2018 11:45 AM  Vitals shown include unvalidated device data.  Last Pain:  Vitals:   05/18/18 0934  TempSrc:   PainSc: 3       Patients Stated Pain Goal: 4 (16/24/46 9507)  Complications: No apparent anesthesia complications

## 2018-05-18 NOTE — Op Note (Signed)
05/18/2018  11:37 AM  PATIENT:  [name]  73 y.o. male  PRE-OPERATIVE DIAGNOSIS:  Post operative cerebrospinal fluild leak  POST-OPERATIVE DIAGNOSIS:  Post operative cerebrospinal fluild leak  PROCEDURE:  Procedure(s): Exploration of Lumbar four-five with repair of cerebrospinal fluid leak (N/A)  SURGEON:  Surgeon(s) and Role:    Erline Levine, MD - Primary  PHYSICIAN ASSISTANT:   ASSISTANTS: Poteat, RN   ANESTHESIA:   general  EBL:  20 mL   BLOOD ADMINISTERED:none  DRAINS: none   LOCAL MEDICATIONS USED:  MARCAINE    and LIDOCAINE   SPECIMEN:  No Specimen  DISPOSITION OF SPECIMEN:  N/A  COUNTS:  YES  TOURNIQUET:  * No tourniquets in log *  DICTATION: Indications:  Patient underwent uncomplicated lumbar laminectomy and discectomy L 45 Left 5 weeks ago, from which he did well.  At approximately week 4 post op, patient developed a CSF leak.  He was brought to OR for wound exploration and repair of CSF leak.  Procedure:  Patient was brought to OR and intubated, then placed prone on Wilson frame.   Back was prepped and draped in usual fashion with betadine scrub and Duraprep.  Old incision was infiltrated with lidocaine.  Prior incision was reopened and laminectomy defect identified.  Microscope was used and a small linear laceration along the nerve root sheath was identified.  This was opposite a roughened edge of bone, but not spike or sharp edge was appreciated.Lateral laminotomy was extended with Kerrison rongeur.  This was slowly leaking CSF.  2 6-0 Prolene sutures were placed along with Adherus.  Prior to placing these grafts, Valsalva demonstrated no egress of CSF.  Wound was closed with 0, 2-0, 3-0 Vicryl sutures.  Sterile occlusive dressing was placed.  Patient was taken to Recovery having tolerated his procedure well.  A Foley catheter was placed for patient to lay flat until AM before liberalizing activity.  PLAN OF CARE: Admit for overnight  observation  PATIENT DISPOSITION:  PACU - hemodynamically stable.   Delay start of Pharmacological VTE agent (>24hrs) due to surgical blood loss or risk of bleeding: yes

## 2018-05-19 ENCOUNTER — Encounter (HOSPITAL_COMMUNITY): Payer: Self-pay | Admitting: Neurosurgery

## 2018-05-19 ENCOUNTER — Telehealth: Payer: Self-pay | Admitting: Internal Medicine

## 2018-05-19 MED ORDER — METHOCARBAMOL 500 MG PO TABS: 500.0000 mg | ORAL_TABLET | Freq: Four times a day (QID) | ORAL | 0 refills | Status: DC | PRN

## 2018-05-19 MED ORDER — HYDROCODONE-ACETAMINOPHEN 5-325 MG PO TABS: 1.0000 | ORAL_TABLET | ORAL | 0 refills | Status: DC | PRN

## 2018-05-19 MED ORDER — CEFAZOLIN SODIUM-DEXTROSE 2-4 GM/100ML-% IV SOLN
2.0000 g | Freq: Three times a day (TID) | INTRAVENOUS | Status: AC
  Administered 2018-05-19: 2 g via INTRAVENOUS
  Filled 2018-05-19: qty 100

## 2018-05-19 NOTE — Telephone Encounter (Signed)
Left VM for patient  Yearly follow up of nonemergent problenms If no symptoms would reschedule for this fall Sept 2020 given current pandemic

## 2018-05-19 NOTE — Discharge Summary (Signed)
Physician Discharge Summary  Patient ID: [name] MRN: [id number] DOB/AGE: Nov 03, 1945 73 y.o.  Admit date: 05/18/2018 Discharge date: 05/19/2018  Admission Diagnoses: Post operative cerebrospinal fluild leak     Discharge Diagnoses: Post operative cerebrospinal fluild leak s/p Exploration of Lumbar four-five with repair of cerebrospinal fluid leak (N/A)       Active Problems:   CSF leak   Discharged Condition: good  Hospital Course: [name] was admitted for repair of CSF leak. Following uncomplicated surgery, he recovered nicely and is mobilizing well.   Consults: None  Significant Diagnostic Studies: radiology: X-Ray: intra-op  Treatments: surgery: Exploration of Lumbar four-five with repair of cerebrospinal fluid leak (N/A)     Discharge Exam: Blood pressure 120/70, pulse (!) 57, temperature 98.1 F (36.7 C), temperature source Oral, resp. rate 16, height 5\' 11"  (1.803 m), weight 81.6 kg, SpO2 96 %. Alert, conversant; anxious. Reports decreased belly pain since BM last night, and decreased lumbar pain since oxycodone. Good strength BLE. Incision flat without erythema, swelling, or drainage beneath honeycomb and Dermabond.      Disposition: Discharge disposition: 01-Home or Self Care Pt will call office to schedule 3-4 wk visit. Rx's will be eRx'ed to his pharmacy from office.       Discharge Instructions    Diet - low sodium heart healthy   Complete by:  As directed    Increase activity slowly   Complete by:  As directed      Allergies as of 05/19/2018      Reactions   Tape Other (See Comments)   Surgical tape=redness/rash at site      Medication List    TAKE these medications   acetaminophen 500 MG tablet Commonly known as:  TYLENOL Take 1,000 mg by mouth every 6 (six) hours as needed for moderate pain or headache.   ascorbic acid 1000 MG tablet Commonly known as:  VITAMIN C Take 1,000 mg by mouth 3 (three) times a week.   CALCIUM 600+D3  PO Take 1 tablet by mouth 3 (three) times a week.   HYDROcodone-acetaminophen 5-325 MG tablet Commonly known as:  NORCO/VICODIN Take 1-2 tablets by mouth every 4 (four) hours as needed for severe pain ((score 7 to 10)). What changed:  Another medication with the same name was added. Make sure you understand how and when to take each.   HYDROcodone-acetaminophen 5-325 MG tablet Commonly known as:  NORCO/VICODIN Take 1-2 tablets by mouth every 4 (four) hours as needed for severe pain ((score 7 to 10)). What changed:  You were already taking a medication with the same name, and this prescription was added. Make sure you understand how and when to take each.   lisinopril-hydrochlorothiazide 10-12.5 MG tablet Commonly known as:  PRINZIDE,ZESTORETIC Take 1 tablet by mouth daily.   LORazepam 0.5 MG tablet Commonly known as:  ATIVAN Take 0.5 mg by mouth at bedtime as needed for anxiety (restless legs).   methocarbamol 500 MG tablet Commonly known as:  ROBAXIN Take 1 tablet (500 mg total) by mouth every 6 (six) hours as needed for muscle spasms. What changed:  Another medication with the same name was added. Make sure you understand how and when to take each.   methocarbamol 500 MG tablet Commonly known as:  ROBAXIN Take 1 tablet (500 mg total) by mouth every 6 (six) hours as needed for muscle spasms. What changed:  You were already taking a medication with the same name, and this prescription was added. Make sure  you understand how and when to take each.   multivitamin with minerals Tabs tablet Take 1 tablet by mouth 3 (three) times a week.   simvastatin 20 MG tablet Commonly known as:  ZOCOR Take 20 mg by mouth at bedtime.   Zims Max-Freeze 3.7 % Gel Generic drug:  Menthol (Topical Analgesic) Apply 1 application topically daily as needed (for hip pain).   zolpidem 12.5 MG CR tablet Commonly known as:  AMBIEN CR Take 6.25 mg by mouth at bedtime.        Signed: Verdis Prime 05/19/2018, 9:38 AM

## 2018-05-19 NOTE — Progress Notes (Addendum)
Subjective: Patient reports "I was having a lot of pain in my back and my stomach last night"  Objective: Vital signs in last 24 hours: Temp:  [97 F (36.1 C)-98.4 F (36.9 C)] 98 F (36.7 C) (03/25 0300) Pulse Rate:  [49-70] 59 (03/25 0300) Resp:  [12-19] 18 (03/25 0300) BP: (114-157)/(53-78) 114/63 (03/25 0300) SpO2:  [95 %-100 %] 99 % (03/25 0300) Weight:  [81.6 kg] 81.6 kg (03/24 0917)  Intake/Output from previous day: 03/24 0701 - 03/25 0700 In: 2483.1 [P.O.:360; I.V.:1923.1; IV Piggyback:200] Out: 3121 [Urine:3100; Stool:1; Blood:20] Intake/Output this shift: No intake/output data recorded.  Alert, conversant; anxious. Reports decreased belly pain since BM last night, and decreased lumbar pain since oxycodone. Good strength BLE. Incision flat without erythema, swelling, or drainage beneath honeycomb and Dermabond.   Lab Results: Recent Labs    05/18/18 0934  WBC 7.1  HGB 13.7  HCT 42.0  PLT 196   BMET Recent Labs    05/18/18 0934  NA 135  K 3.2*  CL 102  CO2 22  GLUCOSE 91  BUN 24*  CREATININE 1.06  CALCIUM 9.7    Studies/Results: No results found.  Assessment/Plan: Improving  LOS: 1 day  Plan to elevate HOB and mobilize gradually this am, working on bowels and pain control. Expect d/c to home today.   Verdis Prime 05/19/2018, 7:43 AM  Patient is doing well.  Discharge home.

## 2018-05-19 NOTE — Evaluation (Signed)
Occupational Therapy Evaluation Patient Details Name: [name] MRN: [id number] DOB: 25-May-1945 Today's Date: 05/19/2018    History of Present Illness Pt is a 73 y/o male now s/pCSF repair on 05/18/2018 after Left Lumbar four-lumbar five Microdiscectomy (Left) with microdissection on 04/01/2018. Pt with PMHx including arthritis, Hypertension, Prostate cancer, and Tremor.   Clinical Impression   Patient evaluated by Occupational Therapy with no further acute OT needs identified. All education has been completed and the patient has no further questions. See below for any follow-up Occupational Therapy or equipment needs. OT to sign off. Thank you for referral.      Follow Up Recommendations  No OT follow up    Equipment Recommendations  None recommended by OT    Recommendations for Other Services       Precautions / Restrictions Precautions Precautions: Fall;Back Precaution Booklet Issued: Yes (comment) Precaution Comments: Reviewed handout and precautions during functional mobility.  Restrictions Weight Bearing Restrictions: No      Mobility Bed Mobility Overal bed mobility: Modified Independent             General bed mobility comments: up in chair on arrival  Transfers Overall transfer level: Modified independent Equipment used: None             General transfer comment: No assist required.     Balance Overall balance assessment: No apparent balance deficits (not formally assessed)                                         ADL either performed or assessed with clinical judgement   ADL Overall ADL's : Independent                                       General ADL Comments: educated on hygiene with clean linen, laundry , use of reacher in the home, setup to read a book, positioning with cat in the home and use of towel to prevent being scratched by cat.   Back handout provided and reviewed adls in detail. Pt educated on:   avoid sitting for long periods of time, correct bed positioning for sleeping avoiding lifting more than 5 pounds and never wash directly over incision. All education is complete and patient indicates understanding.    Vision         Perception     Praxis      Pertinent Vitals/Pain Pain Assessment: Faces Faces Pain Scale: Hurts a little bit Pain Location: General in back Pain Descriptors / Indicators: Operative site guarding Pain Intervention(s): Monitored during session     Hand Dominance Right   Extremity/Trunk Assessment Upper Extremity Assessment Upper Extremity Assessment: Overall WFL for tasks assessed   Lower Extremity Assessment Lower Extremity Assessment: Overall WFL for tasks assessed   Cervical / Trunk Assessment Cervical / Trunk Assessment: Other exceptions Cervical / Trunk Exceptions: s/p microdiscectomy beginning of Feb   Communication Communication Communication: No difficulties   Cognition Arousal/Alertness: Awake/alert Behavior During Therapy: WFL for tasks assessed/performed Overall Cognitive Status: Within Functional Limits for tasks assessed                                     General Comments  Exercises     Shoulder Instructions      Home Living Family/patient expects to be discharged to:: Private residence Living Arrangements: Spouse/significant other Available Help at Discharge: Family;Available 24 hours/day Type of Home: House Home Access: Stairs to enter [employer] of Steps: 2 Entrance Stairs-Rails: Left Home Layout: One level     Bathroom Shower/Tub: Occupational psychologist: Handicapped height     Home Equipment: Shower seat          Prior Functioning/Environment Level of Independence: Independent                 OT Problem List:        OT Treatment/Interventions:      OT Goals(Current goals can be found in the care plan section) Acute Rehab OT Goals Patient Stated  Goal: Home today Potential to Achieve Goals: Good  OT Frequency:     Barriers to D/C:            Co-evaluation              AM-PAC OT "6 Clicks" Daily Activity     Outcome Measure Help from another person eating meals?: None Help from another person taking care of personal grooming?: None Help from another person toileting, which includes using toliet, bedpan, or urinal?: None Help from another person bathing (including washing, rinsing, drying)?: None Help from another person to put on and taking off regular upper body clothing?: None Help from another person to put on and taking off regular lower body clothing?: None 6 Click Score: 24   End of Session Nurse Communication: Mobility status;Precautions  Activity Tolerance: Patient tolerated treatment well Patient left: in chair;with call bell/phone within reach  OT Visit Diagnosis: Unsteadiness on feet (R26.81)                Time: 1540-0867 OT Time Calculation (min): 14 min Charges:  OT General Charges $OT Visit: 1 Visit OT Evaluation $OT Eval Low Complexity: 1 Low   Jeri Modena, OTR/L  Acute Rehabilitation Services Pager: [phone] Office: [phone] .   Jeri Modena 05/19/2018, 11:44 AM

## 2018-05-19 NOTE — Progress Notes (Signed)
IV removed. D/C education and instructions given to pt. Questions answered. No printed prescriptions to give and no equipment to deliver. Pt taken to car via wheelchair with belongings.

## 2018-05-19 NOTE — Evaluation (Signed)
Physical Therapy Evaluation and Discharge Patient Details Name: [name] MRN: [id number] DOB: Apr 29, 1945 Today's Date: 05/19/2018   History of Present Illness  Pt is a 73 y/o male now s/pCSF repair on 05/18/2018 after Left Lumbar four-lumbar five Microdiscectomy (Left) with microdissection on 04/01/2018. Pt with PMHx including arthritis, Hypertension, Prostate cancer, and Tremor.  Clinical Impression  Patient evaluated by Physical Therapy with no further acute PT needs identified. All education has been completed and the patient has no further questions. At the time of PT eval pt was able to perform transfers and ambulation with gross modified independence. Pt was educated on precautions and general safety upon return home. See below for any follow-up Physical Therapy or equipment needs. PT is signing off. Thank you for this referral.     Follow Up Recommendations No PT follow up;Supervision - Intermittent    Equipment Recommendations  None recommended by PT    Recommendations for Other Services       Precautions / Restrictions Precautions Precautions: Fall;Back Precaution Booklet Issued: Yes (comment) Precaution Comments: Reviewed handout and precautions during functional mobility.  Restrictions Weight Bearing Restrictions: No      Mobility  Bed Mobility Overal bed mobility: Modified Independent             General bed mobility comments: Log roll - HOB slightly elevated to simulate home environment.   Transfers Overall transfer level: Modified independent Equipment used: None             General transfer comment: No assist required.   Ambulation/Gait Ambulation/Gait assistance: Modified independent (Device/Increase time) Gait Distance (Feet): 200 Feet Assistive device: None Gait Pattern/deviations: WFL(Within Functional Limits) Gait velocity: Decreased Gait velocity interpretation: 1.31 - 2.62 ft/sec, indicative of limited community ambulator General Gait  Details: Slightly decreased gait speed but overall steady without AD.   Stairs Stairs: Yes Stairs assistance: Modified independent (Device/Increase time) Stair Management: One rail Right;Step to pattern;Forwards Number of Stairs: 4 General stair comments: VC's for sequencing and general safety. No assist required.   Wheelchair Mobility    Modified Rankin (Stroke Patients Only)       Balance Overall balance assessment: No apparent balance deficits (not formally assessed)                                           Pertinent Vitals/Pain Pain Assessment: Faces Faces Pain Scale: Hurts a little bit Pain Location: General in back Pain Descriptors / Indicators: Operative site guarding Pain Intervention(s): Monitored during session    Home Living Family/patient expects to be discharged to:: Private residence Living Arrangements: Spouse/significant other Available Help at Discharge: Family;Available 24 hours/day Type of Home: House Home Access: Stairs to enter Entrance Stairs-Rails: Left Entrance Stairs-Number of Steps: 2 Home Layout: One level Home Equipment: Shower seat      Prior Function Level of Independence: Independent               Hand Dominance   Dominant Hand: Right    Extremity/Trunk Assessment   Upper Extremity Assessment Upper Extremity Assessment: Defer to OT evaluation    Lower Extremity Assessment Lower Extremity Assessment: Overall WFL for tasks assessed    Cervical / Trunk Assessment Cervical / Trunk Assessment: Other exceptions Cervical / Trunk Exceptions: s/p microdiscectomy beginning of Feb  Communication   Communication: No difficulties  Cognition Arousal/Alertness: Awake/alert Behavior During Therapy: WFL for tasks assessed/performed  Overall Cognitive Status: Within Functional Limits for tasks assessed                                        General Comments      Exercises     Assessment/Plan     PT Assessment Patent does not need any further PT services  PT Problem List         PT Treatment Interventions      PT Goals (Current goals can be found in the Care Plan section)  Acute Rehab PT Goals Patient Stated Goal: Home today PT Goal Formulation: All assessment and education complete, DC therapy    Frequency     Barriers to discharge        Co-evaluation               AM-PAC PT "6 Clicks" Mobility  Outcome Measure Help needed turning from your back to your side while in a flat bed without using bedrails?: None Help needed moving from lying on your back to sitting on the side of a flat bed without using bedrails?: None Help needed moving to and from a bed to a chair (including a wheelchair)?: None Help needed standing up from a chair using your arms (e.g., wheelchair or bedside chair)?: None Help needed to walk in hospital room?: None Help needed climbing 3-5 steps with a railing? : A Little 6 Click Score: 23    End of Session Equipment Utilized During Treatment: Gait belt Activity Tolerance: Patient tolerated treatment well Patient left: in chair;Other (comment)(OT present) Nurse Communication: Mobility status PT Visit Diagnosis: Pain Pain - part of body: (Back)    Time: 4801-6553 PT Time Calculation (min) (ACUTE ONLY): 16 min   Charges:   PT Evaluation $PT Eval Low Complexity: 1 Low          Rolinda Roan, PT, DPT Acute Rehabilitation Services Pager: [phone] Office: [phone]   Thelma Comp 05/19/2018, 11:27 AM

## 2018-05-20 NOTE — Telephone Encounter (Signed)
I spoke with patient to confirm he received VM from Dr Harrington Challenger to reschedule March 30 appointment. Pt had already cancelled March 30 appt because of recent back surgery,it had been rescheduled April 27. Pt states he had to have back surgery x 2, was recently discharged from second surgery, because of this asked me to cancel April 27 appt. We discussed current Covid 19 pandemic and rescheduling his appt with Dr Harrington Challenger to Sept 2020. Pt states he had some issues with his BP recently. He had been taking propranolol for tremors, had to discontinue because SBP 85-90 range. In addition, his PCP  cut his lisinopril/HCTZ in half to current dose of 10/12.5. His PCP is monitoring/ treating his BP, recent BP readings 135/75, 140/79.  Pt states he is working with Dr Tat for his tremors, they did get worse after stopping propranolol.  Pt states he would like to plan to see Dr Harrington Challenger in [place], denies any new cardiac symptoms. Pt advised we will plan to contact him in the next few weeks to reschedule appt with Dr Harrington Challenger to [facility] 2020. Pt advised he can  take and record his BP readings daily, send them in MyChart for Dr Harrington Challenger to review, advised I will send this information to Dr Harrington Challenger for review.  Pt was comfortable with this plan, thanked me for call. I will send message to CV 19 cancel pool, July-Aug 2020 appt Dr Harrington Challenger.     Cardiac Questionnaire:    Since your last visit or hospitalization:    1. Have you been having new or worsening chest pain? no   2. Have you been having new or worsening shortness of breath? no 3. Have you been having new or worsening leg swelling, wt gain, or increase in abdominal girth (pants fitting more tightly)? no   4. Have you had any passing out spells? no    If all the answers to these questions are NO, we should indicate that given the current situation regarding the worldwide coronarvirus pandemic, at the recommendation of the CDC, we are looking to limit gatherings in our  waiting area, and thus will reschedule their appointment beyond four weeks from today.

## 2018-05-24 ENCOUNTER — Ambulatory Visit: Payer: Medicare Other | Admitting: Internal Medicine

## 2018-05-31 ENCOUNTER — Ambulatory Visit: Payer: Medicare Other | Admitting: Neurology

## 2018-06-09 ENCOUNTER — Telehealth: Payer: Self-pay | Admitting: Oncology

## 2018-06-09 NOTE — Telephone Encounter (Signed)
Per 4/15 sch message - pt is aware appt on 5/7 has been moved to telephone call

## 2018-06-11 ENCOUNTER — Telehealth: Payer: Self-pay

## 2018-06-11 NOTE — Telephone Encounter (Signed)
   Pt agreeable to VIDEO visit.   Virtual Visit Pre-Appointment Phone Call  Steps For Call:  1. Confirm consent - "In the setting of the current Covid19 crisis, you are scheduled for a VIDEO visit with your provider on 4/23 at 230pm.  Just as we do with many in-office visits, in order for you to participate in this visit, we must obtain consent.  If you'd like, I can send this to your mychart (if signed up) or email for you to review.  Otherwise, I can obtain your verbal consent now.  All virtual visits are billed to your insurance company just like a normal visit would be.  By agreeing to a virtual visit, we'd like you to understand that the technology does not allow for your provider to perform an examination, and thus may limit your provider's ability to fully assess your condition. If your provider identifies any concerns that need to be evaluated in person, we will make arrangements to do so.  Finally, though the technology is pretty good, we cannot assure that it will always work on either your or our end, and in the setting of a video visit, we may have to convert it to a phone-only visit.  In either situation, we cannot ensure that we have a secure connection.  Are you willing to proceed?" STAFF: Did the patient verbally acknowledge consent to telehealth visit? Document YES/NO here: YES  2. Confirm the BEST phone number to call the day of the visit by including in appointment notes  3. Give patient instructions for WebEx/MyChart download to smartphone as below or [email or web address] if video visit (depending on what platform provider is using)  4. Advise patient to be prepared with their blood pressure, heart rate, weight, any heart rhythm information, their current medicines, and a piece of paper and pen handy for any instructions they may receive the day of their visit  5. Inform patient they will receive a phone call 15 minutes prior to their appointment time (may be from unknown caller ID) so  they should be prepared to answer  6. Confirm that appointment type is correct in Epic appointment notes (VIDEO vs PHONE)     TELEPHONE CALL NOTE  [name] has been deemed a candidate for a follow-up tele-health visit to limit community exposure during the Covid-19 pandemic. I spoke with the patient via phone to ensure availability of phone/video source, confirm preferred email & phone number, and discuss instructions and expectations.  I reminded [name] to be prepared with any vital sign and/or heart rhythm information that could potentially be obtained via home monitoring, at the time of his visit. I reminded [name] to expect a phone call at the time of his visit if his visit.  [name], RN 06/11/2018 2:05 PM

## 2018-06-15 DIAGNOSIS — Z85828 Personal history of other malignant neoplasm of skin: Secondary | ICD-10-CM | POA: Diagnosis not present

## 2018-06-15 DIAGNOSIS — D1801 Hemangioma of skin and subcutaneous tissue: Secondary | ICD-10-CM | POA: Diagnosis not present

## 2018-06-15 DIAGNOSIS — Z85820 Personal history of malignant melanoma of skin: Secondary | ICD-10-CM | POA: Diagnosis not present

## 2018-06-15 DIAGNOSIS — L821 Other seborrheic keratosis: Secondary | ICD-10-CM | POA: Diagnosis not present

## 2018-06-15 DIAGNOSIS — L812 Freckles: Secondary | ICD-10-CM | POA: Diagnosis not present

## 2018-06-15 DIAGNOSIS — L570 Actinic keratosis: Secondary | ICD-10-CM | POA: Diagnosis not present

## 2018-06-17 ENCOUNTER — Encounter: Payer: Self-pay | Admitting: Internal Medicine

## 2018-06-17 ENCOUNTER — Other Ambulatory Visit: Payer: Self-pay

## 2018-06-17 ENCOUNTER — Telehealth (INDEPENDENT_AMBULATORY_CARE_PROVIDER_SITE_OTHER): Payer: Medicare Other | Admitting: Internal Medicine

## 2018-06-17 VITALS — BP 139/74 | HR 65 | Ht 71.0 in | Wt 176.5 lb

## 2018-06-17 DIAGNOSIS — I10 Essential (primary) hypertension: Secondary | ICD-10-CM

## 2018-06-17 DIAGNOSIS — E782 Mixed hyperlipidemia: Secondary | ICD-10-CM

## 2018-06-17 DIAGNOSIS — I2510 Atherosclerotic heart disease of native coronary artery without angina pectoris: Secondary | ICD-10-CM | POA: Diagnosis not present

## 2018-06-17 DIAGNOSIS — G250 Essential tremor: Secondary | ICD-10-CM

## 2018-06-17 MED ORDER — LISINOPRIL-HYDROCHLOROTHIAZIDE 20-12.5 MG PO TABS: 1.0000 | ORAL_TABLET | Freq: Every day | ORAL | 3 refills | Status: DC

## 2018-06-17 NOTE — Patient Instructions (Signed)
Medication Instructions:  Your physician has recommended you make the following change in your medication:  1.) lisinopril/hctz has been increased to 20/12.5 mg once a day. A new prescription has been sent to your pharmacy for this medicine.  If you need a refill on your cardiac medications before your next appointment, please call your pharmacy.   Lab work: None today If you have labs (blood work) drawn today and your tests are completely normal, you will receive your results only by: Marland Kitchen MyChart Message (if you have MyChart) OR . A paper copy in the mail If you have any lab test that is abnormal or we need to change your treatment, we will call you to review the results.  Testing/Procedures: none  Follow-Up: At [facility], you and your health needs are our priority.  As part of our continuing mission to provide you with exceptional heart care, we have created designated Provider Care Teams.  These Care Teams include your primary Cardiologist (physician) and Advanced Practice Providers (APPs -  Physician Assistants and Nurse Practitioners) who all work together to provide you with the care you need, when you need it. You will need a follow up appointment in:  6 months.  Please call our office 2 months in advance to schedule this appointment.  You may see  or one of the following Advanced Practice Providers on your Dr. Harrington Challenger designated Care Team: Richardson Dopp, PA-C [place], [place] . Daune Perch, NP  Any Other Special Instructions Will Be Listed Below (If Applicable).

## 2018-06-17 NOTE — Progress Notes (Signed)
Virtual Visit via Video Note   This visit type was conducted due to national recommendations for restrictions regarding the COVID-19 Pandemic (e.g. social distancing) in an effort to limit this patient's exposure and mitigate transmission in our community.  Due to his co-morbid illnesses, this patient is at least at moderate risk for complications without adequate follow up.  This format is felt to be most appropriate for this patient at this time.  All issues noted in this document were discussed and addressed.  A limited physical exam was performed with this format.  Please refer to the patient's chart for his consent to telehealth for [facility].   Evaluation Performed:  Follow-up visit  Date:  06/17/2018   ID:  [name], [name] [birth year], MRN [id number]  Patient Location: Home Provider Location: Home  PCP:  Antony Contras, MD  Cardiologist:  Harrington Challenger  Chief Complaint:  F/U of Chest pain    History of Present Illness:    [name] is a 73 y.o. male with chest pain and hyperlipidemia   He hs very mild atherosclerosis on CT scan   I saw him in March 2019   The ptatient was recently discharged after back surgery in Feb then repair in March  The pt says last fall he went to PCP   BP was low that day 90/   He felt a little week that day  He has been tracking BP    138-160/    He moved up on liisnopril from 10/12.5 to 20/12.5  The last couple days BP 135/79   124/72 the 139/69   Pt denies CP   Breathing is OK  Was taken off propranolol when BP low   Hasnt restarted    I was rx for tremor   Didn't help much  Lipid panel in March 2019 LDL 67   HDL 43  Trig 119     The patient does not have symptoms concerning for COVID-19 infection (fever, chills, cough, or new shortness of breath).    Past Medical History:  Diagnosis Date  . Arthritis    hands   . Cancer [facility])    prostate cancer   . Hypertension   . Prostate cancer ([place])   . Recurrent upper respiratory infection  (URI)    pt had a cold recent- week ago - better now   . Tremor    Past Surgical History:  Procedure Laterality Date  . CATARACT EXTRACTION Bilateral   . LUMBAR LAMINECTOMY/DECOMPRESSION MICRODISCECTOMY Left 04/01/2018   Procedure: Left Lumbar four-lumbar five Microdiscectomy;  Surgeon: Erline Levine, MD;  Location: [place];  Service: Neurosurgery;  Laterality: Left;  . OTHER SURGICAL HISTORY  2005   right hand middle finger surgery   . REPAIR OF CEREBROSPINAL FLUID LEAK N/A 05/18/2018   Procedure: Exploration of Lumbar four-five with repair of cerebrospinal fluid leak;  Surgeon: Erline Levine, MD;  Location: [place];  Service: Neurosurgery;  Laterality: N/A;  . ROBOT ASSISTED LAPAROSCOPIC RADICAL PROSTATECTOMY  01/13/2011   Procedure: ROBOTIC ASSISTED LAPAROSCOPIC RADICAL PROSTATECTOMY LEVEL 2;  Surgeon: Dutch Gray, MD;  Location: [facility];  Service: Urology;  Laterality: Bilateral;  Robotic Assisted Laparoscopic Prostatetectomy with Bilateral Lymphadenectomy  Level 2     Current Meds  Medication Sig  . acetaminophen (TYLENOL) 500 MG tablet Take 1,000 mg by mouth every 6 (six) hours as needed for moderate pain or headache.  Marland Kitchen ascorbic acid (VITAMIN C) 1000 MG tablet Take 1,000 mg by mouth 3 (  three) times a week.  . Calcium Carbonate-Vitamin D (CALCIUM 600+D3 PO) Take 1 tablet by mouth daily.   Marland Kitchen HYDROcodone-acetaminophen (NORCO/VICODIN) 5-325 MG tablet Take 1-2 tablets by mouth every 4 (four) hours as needed for severe pain ((score 7 to 10)).  Marland Kitchen lisinopril-hydrochlorothiazide (PRINZIDE,ZESTORETIC) 10-12.5 MG tablet Take 1 tablet by mouth daily.   Marland Kitchen LORazepam (ATIVAN) 0.5 MG tablet Take 0.5 mg by mouth at bedtime as needed for anxiety (restless legs).  . Menthol, Topical Analgesic, (ZIMS MAX-FREEZE) 3.7 % GEL Apply 1 application topically daily as needed (for hip pain).   . Multiple Vitamin (MULTIVITAMIN WITH MINERALS) TABS tablet Take 1 tablet by mouth 3 (three) times a week.  . simvastatin  (ZOCOR) 20 MG tablet Take 20 mg by mouth at bedtime.   Marland Kitchen zolpidem (AMBIEN CR) 12.5 MG CR tablet Take 6.25 mg by mouth at bedtime.      Allergies:   Tape   Social History   Tobacco Use  . Smoking status: Never Smoker  . Smokeless tobacco: Never Used  Substance Use Topics  . Alcohol use: Yes    Comment: 1 drink per day or less  . Drug use: No     Family Hx: The patient's family history includes Cancer in his mother; Heart attack in his father; Hypertension in his father and sister.  ROS:   Please see the history of present illness.     All other systems reviewed and are negative.   Prior CV studies:   The following studies were reviewed today: Reviewed CT  Labs/Other Tests and Data Reviewed:    EKG:  Not done as tele visit   Recent Labs: 09/29/2017: ALT 21 05/18/2018: BUN 24; Creatinine, Ser 1.06; Hemoglobin 13.7; Platelets 196; Potassium 3.2; Sodium 135   Recent Lipid Panel Lab Results  Component Value Date/Time   CHOL 134 05/21/2017 09:59 AM   TRIG 119 05/21/2017 09:59 AM   HDL 43 05/21/2017 09:59 AM   CHOLHDL 3.1 05/21/2017 09:59 AM   CHOLHDL 2.9 05/25/2015 08:57 AM   LDLCALC 67 05/21/2017 09:59 AM    Wt Readings from Last 3 Encounters:  06/17/18 176 lb 8 oz (80.1 kg)  05/18/18 180 lb (81.6 kg)  04/01/18 180 lb 11.2 oz (82 kg)     Objective:    Vital Signs:  BP 139/74   Pulse 65   Ht 5\' 11"  (1.803 m)   Wt 176 lb 8 oz (80.1 kg)   BMI 24.62 kg/m    Exam not done as televisit  ASSESSMENT & PLAN:    1. Chest pain   Denies   2  Dyspnea  Denies  3   HTN   Labile BP   Now on 20/12.5   I am not that unhappy with pressures     I am not sure why he was low that one day   He denies viral illness.   May not solve Keep track of BP   Call if at extremes Will check labs in fall  4   CAD   Plaquing noted on CT   No angina  5   HL   Labs excellent last year   No changes   6  Hx parkinsons   Has appt to establish with Dr Carles Collet   Will discuss tremor  5   COVID-19 Education: The signs and symptoms of COVID-19 were discussed with the patient and how to seek care for testing (follow up with PCP or arrange E-visit).  The  importance of social distancing was discussed today.  Time:   Today, I have spent 25  minutes with the patient with telehealth technology discussing the above problems.     Medication Adjustments/Labs and Tests Ordered: Current medicines are reviewed at length with the patient today.  Concerns regarding medicines are outlined above.   Tests Ordered: No orders of the defined types were placed in this encounter.   Medication Changes: No orders of the defined types were placed in this encounter.   Disposition:  Follow up 6 months  Signed, Dorris Carnes, MD  06/17/2018 2:46 PM    [place]

## 2018-06-17 NOTE — Addendum Note (Signed)
Addended by: Rodman Key on: 06/17/2018 03:25 PM   Modules accepted: Orders

## 2018-06-21 ENCOUNTER — Ambulatory Visit: Payer: Medicare Other | Admitting: Internal Medicine

## 2018-06-28 ENCOUNTER — Other Ambulatory Visit: Payer: Medicare Other

## 2018-06-29 ENCOUNTER — Other Ambulatory Visit: Payer: Self-pay

## 2018-06-29 ENCOUNTER — Other Ambulatory Visit: Payer: Medicare Other

## 2018-06-29 ENCOUNTER — Ambulatory Visit (HOSPITAL_COMMUNITY): Payer: Medicare Other

## 2018-06-29 ENCOUNTER — Inpatient Hospital Stay: Payer: Medicare Other | Attending: Oncology

## 2018-06-29 ENCOUNTER — Ambulatory Visit (HOSPITAL_COMMUNITY)
Admission: RE | Admit: 2018-06-29 | Discharge: 2018-06-29 | Disposition: A | Discharge status: Home / Self Care | Payer: Medicare Other | Source: Ambulatory Visit | Attending: Oncology | Admitting: Oncology

## 2018-06-29 ENCOUNTER — Ambulatory Visit (HOSPITAL_COMMUNITY): Admission: RE | Admit: 2018-06-29 | Payer: Medicare Other | Source: Ambulatory Visit

## 2018-06-29 DIAGNOSIS — Z79899 Other long term (current) drug therapy: Secondary | ICD-10-CM | POA: Diagnosis not present

## 2018-06-29 DIAGNOSIS — Z923 Personal history of irradiation: Secondary | ICD-10-CM | POA: Diagnosis not present

## 2018-06-29 DIAGNOSIS — C61 Malignant neoplasm of prostate: Secondary | ICD-10-CM | POA: Insufficient documentation

## 2018-06-29 DIAGNOSIS — R918 Other nonspecific abnormal finding of lung field: Secondary | ICD-10-CM | POA: Diagnosis not present

## 2018-06-29 DIAGNOSIS — C6200 Malignant neoplasm of unspecified undescended testis: Secondary | ICD-10-CM | POA: Diagnosis not present

## 2018-06-29 LAB — CBC WITH DIFFERENTIAL (CANCER CENTER ONLY)
Abs Immature Granulocytes: 0.03 10*3/uL (ref 0.00–0.07)
Basophils Absolute: 0.1 10*3/uL (ref 0.0–0.1)
Basophils Relative: 2 %
Eosinophils Absolute: 0.2 10*3/uL (ref 0.0–0.5)
Eosinophils Relative: 3 %
HCT: 37.5 % — ABNORMAL LOW (ref 39.0–52.0)
Hemoglobin: 12.4 g/dL — ABNORMAL LOW (ref 13.0–17.0)
Immature Granulocytes: 1 %
Lymphocytes Relative: 25 %
Lymphs Abs: 1.3 10*3/uL (ref 0.7–4.0)
MCH: 29.6 pg (ref 26.0–34.0)
MCHC: 33.1 g/dL (ref 30.0–36.0)
MCV: 89.5 fL (ref 80.0–100.0)
Monocytes Absolute: 0.5 10*3/uL (ref 0.1–1.0)
Monocytes Relative: 10 %
Neutro Abs: 3.1 10*3/uL (ref 1.7–7.7)
Neutrophils Relative %: 59 %
Platelet Count: 179 10*3/uL (ref 150–400)
RBC: 4.19 MIL/uL — ABNORMAL LOW (ref 4.22–5.81)
RDW: 12.6 % (ref 11.5–15.5)
WBC Count: 5.2 10*3/uL (ref 4.0–10.5)
nRBC: 0 % (ref 0.0–0.2)

## 2018-06-29 LAB — CMP (CANCER CENTER ONLY)
ALT: 19 U/L (ref 0–44)
AST: 20 U/L (ref 15–41)
Albumin: 4 g/dL (ref 3.5–5.0)
Alkaline Phosphatase: 44 U/L (ref 38–126)
Anion gap: 9 (ref 5–15)
BUN: 23 mg/dL (ref 8–23)
CO2: 25 mmol/L (ref 22–32)
Calcium: 9.7 mg/dL (ref 8.9–10.3)
Chloride: 104 mmol/L (ref 98–111)
Creatinine: 1 mg/dL (ref 0.61–1.24)
GFR, Est AFR Am: >60 mL/min (ref >60)
GFR, Estimated: >60 mL/min (ref >60)
Glucose, Bld: 98 mg/dL (ref 70–99)
Potassium: 4.1 mmol/L (ref 3.5–5.1)
Sodium: 138 mmol/L (ref 135–145)
Total Bilirubin: 0.5 mg/dL (ref 0.3–1.2)
Total Protein: 7.1 g/dL (ref 6.5–8.1)

## 2018-06-29 MED ORDER — IOHEXOL 300 MG/ML  SOLN
100.0000 mL | Freq: Once | INTRAMUSCULAR | Status: AC | PRN
  Administered 2018-06-29: 10:00:00 100 mL via INTRAVENOUS

## 2018-06-29 MED ORDER — SODIUM CHLORIDE (PF) 0.9 % IJ SOLN
INTRAMUSCULAR | Status: AC
  Filled 2018-06-29: qty 50

## 2018-06-30 LAB — PROSTATE-SPECIFIC AG, SERUM (LABCORP): Prostate Specific Ag, Serum: <0.1 ng/mL (ref 0.0–4.0)

## 2018-06-30 LAB — TESTOSTERONE: Testosterone: 83 ng/dL — ABNORMAL LOW (ref 264–916)

## 2018-07-01 ENCOUNTER — Telehealth: Payer: Self-pay | Admitting: Oncology

## 2018-07-01 ENCOUNTER — Inpatient Hospital Stay (HOSPITAL_BASED_OUTPATIENT_CLINIC_OR_DEPARTMENT_OTHER): Payer: Medicare Other | Admitting: Oncology

## 2018-07-01 DIAGNOSIS — C61 Malignant neoplasm of prostate: Secondary | ICD-10-CM

## 2018-07-01 NOTE — Telephone Encounter (Signed)
Scheduled appt per 5/07 sch message - sent reminder letter in the mail  For appt March 2021

## 2018-07-01 NOTE — Progress Notes (Signed)
Hematology and Oncology Follow Up for Telemedicine Visits  [name] [id number] 08-Aug-1945 73 y.o. 07/01/2018 8:18 AM [name], [name], MD   I connected with [name] on 07/01/18 at  9:00 AM EDT by telephone visit and verified that I am speaking with the correct person using two identifiers.   I discussed the limitations, risks, security and privacy concerns of performing an evaluation and management service by telemedicine and the availability of in-person appointments. I also discussed with the patient that there may be a patient responsible charge related to this service. The patient expressed understanding and agreed to proceed.  Other persons participating in the visit and their role in the encounter: None  Patient's location: Home Provider's location: Office    Principle Diagnosis: 73 year old man with prostate cancer  diagnosed in 2012.  He was found to have Gleason score 4+3 equal 7, clinical stage T2N1.    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 Prior Therapy: He presented with a PSA of 4.38 and subsequently underwent a radical prostatectomy and lymph node dissection on November of 2012. 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  He did not have any evidence of angiolymphatic invasion or extraprostatic extension. The margins were negative. He had one out of 12 lymph nodes involved with cancer predominantly in the right pelvic wall.  His post operative PSA remained at 0.65 and subsequently to 0.89. And in March of 2013 he was started on Lupron and have been on it since that time. His PSA remains very low 0.01.  His PSA went up to 0.3 in March 2017. He completed salvage radiation therapy completed in June 2017 under the care of Dr. Tammi Klippel. He received planned dose of 68.4 grays. Received intermittent androgen deprivation therapy last treatment was in 2017.  Current  therapy: Active surveillance only.    Interim History: [name] reports feeling reasonably well without any major changes.  He underwent left L4 L5 microdiscectomy and dissection completed by Dr. Vertell Limber on April 01, 2018.  He had a another procedure in March 2020 for CSF leak.  He is recovering well from this operation without any major concerns.  He is ambulating without any difficulties reports no falls or syncope.  He denies any worsening back pain or shoulder pain.  He denies any urinary difficulties.     Medications: I have reviewed the patient's current medications.  Current Outpatient Medications  Medication Sig Dispense Refill  . acetaminophen (TYLENOL) 500 MG tablet Take 1,000 mg by mouth every 6 (six) hours as needed for moderate pain or headache.    Marland Kitchen ascorbic acid (VITAMIN C) 1000 MG tablet Take 1,000 mg by mouth 3 (three) times a week.    . Calcium Carbonate-Vitamin D (CALCIUM 600+D3 PO) Take 1 tablet by mouth daily.     Marland Kitchen HYDROcodone-acetaminophen (NORCO/VICODIN) 5-325 MG tablet Take 1-2 tablets by mouth every 4 (four) hours as needed for severe pain ((score 7 to 10)). 30 tablet 0  . lisinopril-hydrochlorothiazide (ZESTORETIC) 20-12.5 MG tablet Take 1 tablet by mouth daily. 90 tablet 3  . LORazepam (ATIVAN) 0.5 MG tablet Take 0.5 mg by mouth at bedtime as needed for anxiety (restless legs).    . Menthol, Topical Analgesic, (ZIMS MAX-FREEZE) 3.7 % GEL Apply 1 application topically daily as needed (for hip pain).     . Multiple Vitamin (MULTIVITAMIN WITH MINERALS) TABS tablet Take 1 tablet by mouth 3 (three) times a week.    Marland Kitchen  simvastatin (ZOCOR) 20 MG tablet Take 20 mg by mouth at bedtime.     Marland Kitchen zolpidem (AMBIEN CR) 12.5 MG CR tablet Take 6.25 mg by mouth at bedtime.      No current facility-administered medications for this visit.      Allergies:  Allergies  Allergen Reactions  . Tape Other (See Comments)    Surgical tape=redness/rash at site    Past Medical History,  Surgical history, Social history, and Family History were reviewed and updated.    Lab Results: Lab Results  Component Value Date   WBC 5.2 06/29/2018   HGB 12.4 (L) 06/29/2018   HCT 37.5 (L) 06/29/2018   MCV 89.5 06/29/2018   PLT 179 06/29/2018     Chemistry      Component Value Date/Time   NA 138 06/29/2018 0908   NA 141 01/06/2017 0820   K 4.1 06/29/2018 0908   K 4.2 01/06/2017 0820   CL 104 06/29/2018 0908   CO2 25 06/29/2018 0908   CO2 26 01/06/2017 0820   BUN 23 06/29/2018 0908   BUN 28.9 (H) 01/06/2017 0820   CREATININE 1.00 06/29/2018 0908   CREATININE 1.1 01/06/2017 0820      Component Value Date/Time   CALCIUM 9.7 06/29/2018 0908   CALCIUM 9.7 01/06/2017 0820   ALKPHOS 44 06/29/2018 0908   ALKPHOS 41 01/06/2017 0820   AST 20 06/29/2018 0908   AST 19 01/06/2017 0820   ALT 19 06/29/2018 0908   ALT 16 01/06/2017 0820   BILITOT 0.5 06/29/2018 0908   BILITOT 0.43 01/06/2017 0820     Results for Vig, Tige L (MRN [id number]) as of 07/01/2018 07:58  Ref. Range 06/29/2018 09:08  Prostate Specific Ag, Serum Latest Ref Range: 0.0 - 4.0 ng/mL <0.1    Radiological Studies: Ct Chest W Contrast  Result Date: 06/29/2018 CLINICAL DATA:  Testicular neoplasm EXAM: CT CHEST, ABDOMEN, AND PELVIS WITH CONTRAST TECHNIQUE: Multidetector CT imaging of the chest, abdomen and pelvis was performed following the standard protocol during bolus administration of intravenous contrast. CONTRAST:  138mL OMNIPAQUE IOHEXOL 300 MG/ML SOLN, additional oral enteric contrast COMPARISON:  01/06/2017, 04/30/2016 FINDINGS: CT CHEST FINDINGS Cardiovascular: Left coronary artery calcifications. 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 Normal heart size. No pericardial effusion. Mediastinum/Nodes: No enlarged mediastinal, hilar, or axillary lymph nodes. Thyroid gland, trachea, and esophagus demonstrate no significant findings. Lungs/Pleura: Stable small benign pulmonary  nodules, for example a 3 mm nodule of the superior segment right lower lobe (series 7, image 74). No pleural effusion or pneumothorax. Musculoskeletal: No chest wall mass or suspicious bone lesions identified. CT ABDOMEN PELVIS FINDINGS Hepatobiliary: Stable subcentimeter lesions of the right and left lobes of the liver, consistent with tiny cysts or hemangiomata (series 2, image 56, 57). No gallstones, gallbladder wall thickening, or biliary dilatation. Pancreas: Unremarkable. No pancreatic ductal dilatation or surrounding inflammatory changes. Spleen: Normal in size without focal abnormality. Adrenals/Urinary Tract: Adrenal glands are unremarkable. Kidneys are normal, without renal calculi, focal lesion, or hydronephrosis. Mild thickening of the decompressed urinary bladder. Stomach/Bowel: Stomach is within normal limits. No evidence of bowel wall thickening, distention, or inflammatory changes. Vascular/Lymphatic: No significant vascular findings are present. No enlarged abdominal or pelvic lymph nodes. Reproductive: Status post prostatectomy. Other: No abdominal wall hernia or abnormality. No abdominopelvic ascites. Musculoskeletal: No acute or significant osseous findings. IMPRESSION: 1.  No evidence of malignancy in the chest, abdomen, or pelvis. 2.  Stable small benign pulmonary nodules. 3.  Postoperative findings of prostatectomy Electronically Signed   By: Eddie Candle M.D.   On: 06/29/2018 15:06   Ct Abdomen Pelvis W Contrast  Result Date: 06/29/2018 CLINICAL DATA:  Testicular neoplasm EXAM: CT CHEST, ABDOMEN, AND PELVIS WITH CONTRAST TECHNIQUE: Multidetector CT imaging of the chest, abdomen and pelvis was performed following the standard protocol during bolus administration of intravenous contrast. CONTRAST:  173mL OMNIPAQUE IOHEXOL 300 MG/ML SOLN, additional oral enteric contrast COMPARISON:  01/06/2017, 04/30/2016 FINDINGS: CT CHEST FINDINGS Cardiovascular: Left coronary artery calcifications.  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 Normal heart size. No pericardial effusion. Mediastinum/Nodes: No enlarged mediastinal, hilar, or axillary lymph nodes. Thyroid gland, trachea, and esophagus demonstrate no significant findings. Lungs/Pleura: Stable small benign pulmonary nodules, for example a 3 mm nodule of the superior segment right lower lobe (series 7, image 74). No pleural effusion or pneumothorax. Musculoskeletal: No chest wall mass or suspicious bone lesions identified. CT ABDOMEN PELVIS FINDINGS Hepatobiliary: Stable subcentimeter lesions of the right and left lobes of the liver, consistent with tiny cysts or hemangiomata (series 2, image 56, 57). No gallstones, gallbladder wall thickening, or biliary dilatation. Pancreas: Unremarkable. No pancreatic ductal dilatation or surrounding inflammatory changes. Spleen: Normal in size without focal abnormality. Adrenals/Urinary Tract: Adrenal glands are unremarkable. Kidneys are normal, without renal calculi, focal lesion, or hydronephrosis. Mild thickening of the decompressed urinary bladder. Stomach/Bowel: Stomach is within normal limits. No evidence of bowel wall thickening, distention, or inflammatory changes. Vascular/Lymphatic: No significant vascular findings are present. No enlarged abdominal or pelvic lymph nodes. Reproductive: Status post prostatectomy. Other: No abdominal wall hernia or abnormality. No abdominopelvic ascites. Musculoskeletal: No acute or significant osseous findings. IMPRESSION: 1.  No evidence of malignancy in the chest, abdomen, or pelvis. 2.  Stable small benign pulmonary nodules. 3.  Postoperative findings of prostatectomy Electronically Signed   By: Eddie Candle M.D.   On: 06/29/2018 15:06     Impression and Plan:  73 year old man with:  1. Prostate cancer  diagnosed in 2012 and underwent prostatectomy and found to have adenocarcinoma with neuroendocrine differentiation.  He remains on  active surveillance at this time.     His laboratory data on Jun 29, 2018 as well as CT scan were personally reviewed and discussed with him today.  His imaging studies as well as laboratory testing do not suggest any cancer recurrence.  The natural course of this disease was reviewed and risk of relapse was assessed and at this time of recommended continue active surveillance without any active treatment at this time.  2. Androgen depravation:  He completed more than 2 years of intermittent androgen deprivation discontinued in 2017.  3. Bone health:  I continue to recommend calcium vitamin D supplements.  4. Follow-up: Will be in  10 months for repeat imaging studies and laboratory testing.     I discussed the assessment and treatment plan with the patient. The patient was provided an opportunity to ask questions and all were answered. The patient agreed with the plan and demonstrated an understanding of the instructions.   The patient was advised to call back or seek an in-person evaluation if the symptoms worsen or if the condition fails to improve as anticipated.  I provided 20 minutes of non face-to-face telephone visit time during this encounter, and > 50% of the time was dedicated to reviewing his laboratory data, imaging studies and answering questions regarding future plan of care.  Zola Button, MD 07/01/2018 8:18 AM

## 2018-07-02 NOTE — Progress Notes (Signed)
Virtual Visit via Video Note The purpose of this virtual visit is to provide medical care while limiting exposure to the novel coronavirus.    Consent was obtained for video visit:  Yes.   Answered questions that patient had about telehealth interaction:  Yes.   I discussed the limitations, risks, security and privacy concerns of performing an evaluation and management service by telemedicine. I also discussed with the patient that there may be a patient responsible charge related to this service. The patient expressed understanding and agreed to proceed.  Pt location: Home Physician Location: office Name of referring provider:  Erline Levine, MD I connected with [name] at patients initiation/request on 07/05/2018 at  1:00 PM EDT by video enabled telemedicine application and verified that I am speaking with the correct person using two identifiers. Pt MRN:  [id number] Pt DOB:  1945-09-22 Video Participants:  [name];  wife   History of Present Illness: ** Patient is a 73 y.o. male who recently had L4-L5 microdiscectomy with subsequent CSF leak requiring repair who presents today for the diagnosis of tremor.  Patient has previously seen Dr. Krista Blue.  Those records have been reviewed.  Patient was seen by Dr. Krista Blue in 2017 and started on primidone, 50 mg and told to work up to 100 mg at night.  He took 150 mg tablet for 3 days and call back and stated that he had vivid dreams and discontinued the medication.  He was then started on propranolol, 40 mg twice per day.  He didn't think it helped that much and it dropped BP in combination with other meds  How long has it been going on? 10-15 years At rest or with activation?  With use Fam hx of tremor?  No. Located where?  Hands bilaterally now but used to only be the L hand for the first 9 years (he is R handed) Affected by caffeine:  Doesn't drink any Affected by alcohol:  No. (drinks one glass wine/day) Affected by stress:  Yes.    Affected by fatigue:  No. Spills soup if on spoon:  Yes.   but does better with spoon than fork (peas are bad) Spills glass of liquid if full:  Yes.   (if in the L hand) Affects ADL's (tying shoes, brushing teeth, etc):  Yes.    (trouble getting toothpaste on the toothbrush)  Last MRI of the brain was in 2004.  I personally reviewed that.  It was normal.  Current Outpatient Medications on File Prior to Visit  Medication Sig Dispense Refill  . acetaminophen (TYLENOL) 500 MG tablet Take 1,000 mg by mouth every 6 (six) hours as needed for moderate pain or headache.    Marland Kitchen ascorbic acid (VITAMIN C) 1000 MG tablet Take 1,000 mg by mouth 3 (three) times a week.    . Calcium Carbonate-Vitamin D (CALCIUM 600+D3 PO) Take 1 tablet by mouth daily.     Marland Kitchen HYDROcodone-acetaminophen (NORCO/VICODIN) 5-325 MG tablet Take 1-2 tablets by mouth every 4 (four) hours as needed for severe pain ((score 7 to 10)). 30 tablet 0  . lisinopril-hydrochlorothiazide (ZESTORETIC) 20-12.5 MG tablet Take 1 tablet by mouth daily. 90 tablet 3  . LORazepam (ATIVAN) 0.5 MG tablet Take 0.5 mg by mouth at bedtime as needed for anxiety (restless legs).    . Menthol, Topical Analgesic, (ZIMS MAX-FREEZE) 3.7 % GEL Apply 1 application topically daily as needed (for hip pain).     . Multiple Vitamin (MULTIVITAMIN WITH MINERALS) TABS  tablet Take 1 tablet by mouth 3 (three) times a week.    . simvastatin (ZOCOR) 20 MG tablet Take 20 mg by mouth at bedtime.     Marland Kitchen zolpidem (AMBIEN CR) 12.5 MG CR tablet Take 6.25 mg by mouth at bedtime.      No current facility-administered medications on file prior to visit.      Past Medical History:  Diagnosis Date  . Arthritis    hands   . Cancer [facility])    prostate cancer   . Hyperlipidemia   . Hypertension   . Prostate cancer ([place])   . Recurrent upper respiratory infection (URI)    pt had a cold recent- week ago - better now   . Tremor    Past Surgical History:  Procedure Laterality Date  .  CATARACT EXTRACTION Bilateral   . LUMBAR LAMINECTOMY/DECOMPRESSION MICRODISCECTOMY Left 04/01/2018   Procedure: Left Lumbar four-lumbar five Microdiscectomy;  Surgeon: Erline Levine, MD;  Location: [place];  Service: Neurosurgery;  Laterality: Left;  . OTHER SURGICAL HISTORY  2005   right hand middle finger surgery   . REPAIR OF CEREBROSPINAL FLUID LEAK N/A 05/18/2018   Procedure: Exploration of Lumbar four-five with repair of cerebrospinal fluid leak;  Surgeon: Erline Levine, MD;  Location: [place];  Service: Neurosurgery;  Laterality: N/A;  . ROBOT ASSISTED LAPAROSCOPIC RADICAL PROSTATECTOMY  01/13/2011   Procedure: ROBOTIC ASSISTED LAPAROSCOPIC RADICAL PROSTATECTOMY LEVEL 2;  Surgeon: Dutch Gray, MD;  Location: [facility];  Service: Urology;  Laterality: Bilateral;  Robotic Assisted Laparoscopic Prostatetectomy with Bilateral Lymphadenectomy  Level 2   Social History   Tobacco Use  . Smoking status: Never Smoker  . Smokeless tobacco: Never Used  Substance Use Topics  . Alcohol use: Yes    Comment: 1 drink per day or less  . Drug use: No   Family History  Problem Relation Age of Onset  . Heart attack Father   . Hypertension Father   . Cancer Mother        cervical with mets to lung  . Hypertension Sister    Review of Systems  Constitutional: Negative.   HENT: Negative.   Eyes: Negative.   Respiratory: Negative.   Cardiovascular: Negative.   Gastrointestinal: Negative.   Genitourinary:       Urinary incontinence  Musculoskeletal: Positive for back pain (resolving, just at surgical site now).  Skin: Negative.   Neurological: Positive for tremors.  Endo/Heme/Allergies: Negative.         Observations/Objective:   Vitals:   07/05/18 1257  BP: (!) 162/93  Pulse: 82  Weight: 180 lb (81.6 kg)  Height: 5\' 11"  (1.803 m)   GEN:  The patient appears stated age and is in NAD.  Neurological examination:  Orientation: The patient is alert and oriented x3. Cranial nerves: There is  good facial symmetry. There is mild facial hypomimia.  The speech is fluent and clear. Soft palate rises symmetrically and there is no tongue deviation. Hearing is slightly decreased to conversational tone, but we were interacting through the tele-visit. Motor: Strength is at least antigravity x 4.   Shoulder shrug is equal and symmetric.  There is no pronator drift.  Movement examination: Tone: unable Abnormal movements: There is some rest tremor on the left that increases with distraction.  There is at least moderate postural tremor on the left.  He has difficulty with Archimedes spirals bilaterally.  He pours water from one glass to another fairly well, although tremor is certainly evident.  Coordination:  There is mild decremation with RAM's, with finger taps on the left and toe taps on the left.  Otherwise, rapid alternating movements were good. Gait and Station: The patient arises out of a tall barstool well without the use of his hands.  He walks well, but appears to have slight decreased arm swing on the left    Chemistry      Component Value Date/Time   NA 138 06/29/2018 0908   NA 141 01/06/2017 0820   K 4.1 06/29/2018 0908   K 4.2 01/06/2017 0820   CL 104 06/29/2018 0908   CO2 25 06/29/2018 0908   CO2 26 01/06/2017 0820   BUN 23 06/29/2018 0908   BUN 28.9 (H) 01/06/2017 0820   CREATININE 1.00 06/29/2018 0908   CREATININE 1.1 01/06/2017 0820      Component Value Date/Time   CALCIUM 9.7 06/29/2018 0908   CALCIUM 9.7 01/06/2017 0820   ALKPHOS 44 06/29/2018 0908   ALKPHOS 41 01/06/2017 0820   AST 20 06/29/2018 0908   AST 19 01/06/2017 0820   ALT 19 06/29/2018 0908   ALT 16 01/06/2017 0820   BILITOT 0.5 06/29/2018 0908   BILITOT 0.43 01/06/2017 0820     No results found for: TSH    Assessment and Plan:   1.  Tremor.  -This likely represents longstanding essential tremor, now with a rest component, which certainly can happen.  However, given that he appears to have  decreased arm swing on the left and some decreased alternating movements on the left, I really want to bring him in for a more thorough exam and to be able to see what his tone is.  He was agreeable to that.   -If this turns out to be longstanding essential tremor, we may be able to convince him to go back and retry primidone.   He previously tried primidone and had bad dreams and discontinued it after 3 days.  I am not completely convinced that he would have this again, especially if he only started at 25 mg.  -Propranolol caused his blood pressure to lower.  He is no longer on that.  2.  LBP  -Patient is status post recent lumbar discectomy and status post CSF leak/repair.  He is doing better and is having some mild back discomfort at incision site now.  He is following with Dr. Vertell Limber.  Time will likely heal the discomfort now that surgery has repaired the leak.   Follow Up Instructions:    -I discussed the assessment and treatment plan with the patient. The patient was provided an opportunity to ask questions and all were answered. The patient agreed with the plan and demonstrated an understanding of the instructions.   The patient was advised to call back or seek an in-person evaluation if the symptoms worsen or if the condition fails to improve as anticipated.    Total Time spent in visit with the patient was:  40 min.  Pt understands and agrees with the plan of care outlined.     Alonza Bogus, DO

## 2018-07-05 ENCOUNTER — Encounter: Payer: Self-pay | Admitting: Neurology

## 2018-07-05 ENCOUNTER — Telehealth (INDEPENDENT_AMBULATORY_CARE_PROVIDER_SITE_OTHER): Payer: Medicare Other | Admitting: Neurology

## 2018-07-05 ENCOUNTER — Other Ambulatory Visit: Payer: Self-pay

## 2018-07-05 DIAGNOSIS — M545 Low back pain, unspecified: Secondary | ICD-10-CM

## 2018-07-05 DIAGNOSIS — R251 Tremor, unspecified: Secondary | ICD-10-CM

## 2018-07-06 ENCOUNTER — Telehealth: Payer: Self-pay | Admitting: Neurology

## 2018-07-06 NOTE — Telephone Encounter (Signed)
Patient is calling in about appt that was supposed to be for May 22 that he couldn't do. He said the VV didn't go as planned and that he was supposed to be scheduled for some testing or another appt but couldn't remember. I don't see anything in the chart about this. Do you know what he is talking about? Thanks!

## 2018-07-06 NOTE — Telephone Encounter (Signed)
Yes.  He needs an in person follow up appointment.  I was trying to bring him in on 5/22 but H[place] Dyer said he was on vacation from 5/19-5/28.    Bring him in on 6/1 at 11:15.  Can you make sure that his vacation is not out of the country.  Thanks.

## 2018-07-07 NOTE — Telephone Encounter (Signed)
Patient is scheduled for 6/1 at 11:15AM. He said he is not going out of the country for [place]. Thanks!

## 2018-07-12 ENCOUNTER — Ambulatory Visit: Payer: Medicare Other | Admitting: Neurology

## 2018-07-15 DIAGNOSIS — Z1211 Encounter for screening for malignant neoplasm of colon: Secondary | ICD-10-CM | POA: Diagnosis not present

## 2018-07-15 DIAGNOSIS — F439 Reaction to severe stress, unspecified: Secondary | ICD-10-CM | POA: Diagnosis not present

## 2018-07-15 DIAGNOSIS — G250 Essential tremor: Secondary | ICD-10-CM | POA: Diagnosis not present

## 2018-07-15 DIAGNOSIS — G4700 Insomnia, unspecified: Secondary | ICD-10-CM | POA: Diagnosis not present

## 2018-07-15 DIAGNOSIS — C61 Malignant neoplasm of prostate: Secondary | ICD-10-CM | POA: Diagnosis not present

## 2018-07-15 DIAGNOSIS — E7800 Pure hypercholesterolemia, unspecified: Secondary | ICD-10-CM | POA: Diagnosis not present

## 2018-07-15 DIAGNOSIS — D696 Thrombocytopenia, unspecified: Secondary | ICD-10-CM | POA: Diagnosis not present

## 2018-07-15 DIAGNOSIS — I10 Essential (primary) hypertension: Secondary | ICD-10-CM | POA: Diagnosis not present

## 2018-07-15 DIAGNOSIS — M25552 Pain in left hip: Secondary | ICD-10-CM | POA: Diagnosis not present

## 2018-07-22 NOTE — Progress Notes (Addendum)
[name] was seen today in follow up for tremor.  Patient was seen in telemedicine consult on Jul 05, 2018.  Patient has longstanding history of essential tremor, but has developed a rest tremor with that.  Since our last visit, the patient denies falls.  Pt denies lightheadedness, near syncope.  No hallucinations.  Mood has been good.   PREVIOUS MEDICATIONS: primidone (bad dreams and discontinued after 3 days); propranolol (cause blood pressure to decrease)  ALLERGIES:   Allergies  Allergen Reactions   Tape Other (See Comments)    Surgical tape=redness/rash at site    CURRENT MEDICATIONS:  Outpatient Encounter Medications as of 07/26/2018  Medication Sig   acetaminophen (TYLENOL) 500 MG tablet Take 1,000 mg by mouth every 6 (six) hours as needed for moderate pain or headache.   ascorbic acid (VITAMIN C) 1000 MG tablet Take 1,000 mg by mouth 3 (three) times a week.   Calcium Carbonate-Vitamin D (CALCIUM 600+D3 PO) Take 1 tablet by mouth daily.    HYDROcodone-acetaminophen (NORCO/VICODIN) 5-325 MG tablet Take 1-2 tablets by mouth every 4 (four) hours as needed for severe pain ((score 7 to 10)).   lisinopril-hydrochlorothiazide (ZESTORETIC) 20-12.5 MG tablet Take 1 tablet by mouth daily.   LORazepam (ATIVAN) 0.5 MG tablet Take 0.5 mg by mouth at bedtime as needed for anxiety (restless legs).   Menthol, Topical Analgesic, (ZIMS MAX-FREEZE) 3.7 % GEL Apply 1 application topically daily as needed (for hip pain).    Multiple Vitamin (MULTIVITAMIN WITH MINERALS) TABS tablet Take 1 tablet by mouth 3 (three) times a week.   simvastatin (ZOCOR) 20 MG tablet Take 20 mg by mouth at bedtime.    zolpidem (AMBIEN CR) 12.5 MG CR tablet Take 6.25 mg by mouth at bedtime.    primidone (MYSOLINE) 50 MG tablet Take 1 tablet (50 mg total) by mouth 2 (two) times a day.   No facility-administered encounter medications on file as of 07/26/2018.     PAST MEDICAL HISTORY:   Past Medical  History:  Diagnosis Date   Arthritis    hands    Cancer ([place])    prostate cancer    Hyperlipidemia    Hypertension    Prostate cancer ([place])    Recurrent upper respiratory infection (URI)    pt had a cold recent- week ago - better now    Tremor     PAST SURGICAL HISTORY:   Past Surgical History:  Procedure Laterality Date   CATARACT EXTRACTION Bilateral    LUMBAR LAMINECTOMY/DECOMPRESSION MICRODISCECTOMY Left 04/01/2018   Procedure: Left Lumbar four-lumbar five Microdiscectomy;  Surgeon: Erline Levine, MD;  Location: [place];  Service: Neurosurgery;  Laterality: Left;   OTHER SURGICAL HISTORY  2005   right hand middle finger surgery    REPAIR OF CEREBROSPINAL FLUID LEAK N/A 05/18/2018   Procedure: Exploration of Lumbar four-five with repair of cerebrospinal fluid leak;  Surgeon: Erline Levine, MD;  Location: [place];  Service: Neurosurgery;  Laterality: N/A;   ROBOT ASSISTED LAPAROSCOPIC RADICAL PROSTATECTOMY  01/13/2011   Procedure: ROBOTIC ASSISTED LAPAROSCOPIC RADICAL PROSTATECTOMY LEVEL 2;  Surgeon: Dutch Gray, MD;  [facility];  Service: Urology;  Laterality: Bilateral;  Robotic Assisted Laparoscopic Prostatetectomy with Bilateral Lymphadenectomy  Level 2    SOCIAL HISTORY:   Social History   Socioeconomic History   Marital status: Married    Spouse name: Not on file   Number of children: 2   Years of education: Bachelors   Highest education level: Not  on file  Occupational History   Occupation: Retired    Comment: Advertising copywriter strain: Not on file   Food insecurity:    Worry: Not on file    Inability: Not on file   Transportation needs:    Medical: Not on file    Non-medical: Not on file  Tobacco Use   Smoking status: Never Smoker   Smokeless tobacco: Never Used  Substance and Sexual Activity   Alcohol use: Yes    Comment: 1 drink per day or less   Drug use: No   Sexual activity: Never  Lifestyle    Physical activity:    Days per week: Not on file    Minutes per session: Not on file   Stress: Not on file  Relationships   Social connections:    Talks on phone: Not on file    Gets together: Not on file    Attends religious service: Not on file    Active member of club or organization: Not on file    Attends meetings of clubs or organizations: Not on file    Relationship status: Not on file   Intimate partner violence:    Fear of current or ex partner: Not on file    Emotionally abused: Not on file    Physically abused: Not on file    Forced sexual activity: Not on file  Other Topics Concern   Not on file  Social History Narrative   Lives at home with his wife.   Right-handed.   No caffeine use.    FAMILY HISTORY:   Family Status  Relation Name Status   Father  Deceased at age 21   Mother  Deceased at age 78   Sister  Alive   MGM  Deceased   MGF  Deceased   [place]  Deceased   PGF  Deceased   Child 2 Alive    ROS:  Review of Systems  Constitutional: Negative.   HENT: Negative.   Eyes: Negative.   Respiratory: Negative.   Cardiovascular: Negative.   Gastrointestinal: Negative.   Genitourinary: Negative.   Skin: Negative.   Psychiatric/Behavioral: Positive for memory loss.    PHYSICAL EXAMINATION:    VITALS:   Vitals:   07/26/18 1120  BP: 110/71  Pulse: 84  Temp: 98.3 F (36.8 C)  SpO2: 97%  Weight: 184 lb 3.2 oz (83.6 kg)    GEN:  The patient appears stated age and is in NAD. HEENT:  Normocephalic, atraumatic.  The mucous membranes are moist. The superficial temporal arteries are without ropiness or tenderness. CV:  RRR Lungs:  CTAB Neck/HEME:  There are no carotid bruits bilaterally.  Neurological examination:  Orientation: The patient is alert and oriented x3. Cranial nerves: There is good facial symmetry with no significant facial hypomimia. The speech is fluent and clear.  There is vocal tremor.  Soft palate rises symmetrically and  there is no tongue deviation. Hearing is intact to conversational tone. Sensation: Sensation is intact to light touch throughout.  Vibration is decreased distally. Motor: Strength is at least antigravity x4. Deep tendon reflexes: Deep tendon reflex are 2/4 at the bilateral biceps, triceps, brachioradialis, patella.  Movement examination: Tone: There is normal tone in the UE/LE Abnormal movements: there is LUE rest tremor that doesn't change with distraction.  There is mild postural tremor on the R and mod on the L.  It is not increased when given a weight.  he has  difficulty with archimedes spirals, especially on the left.  he has  difficulty when asked to pour a full glass of water from one glass to another, especially when the full glasses in the left hand.  There is a vocal tremor Coordination:  There is no decremation with RAM's, with hand opening and closing, finger taps, alteration of supination/pronation of the forearm, heel taps or toe taps Gait and Station: The patient easily rises out of a chair.  He walks well down the hall with good arm swing.  He has mild trouble ambulating in a tandem fashion.  Addendum labs: Lab work was received from his primary care physician and dated January 11, 2018.  White blood cells were 6.0, hemoglobin 13.2, hematocrit 39.5 and platelets 162.  TSH was 4.20.  Last CMP was August 10, 2017.  Sodium was 138, potassium 4.4, chloride 103, CO2 29, BUN 30, creatinine 1.19, glucose 98, AST 19, ALT 15, alkaline phosphatase 31.  ASSESSMENT/PLAN:  1.  Essential Tremor.  - We discussed nature and pathophysiology.  We discussed that this can continue to gradually get worse with time.  We discussed that some medications can worsen this, as can caffeine use.  We discussed medication therapy as well as surgical therapy.  He was shown HIPAA compliant videos of patients who have had surgery, as I think that this would be his best chance for complete resolution.  He does not  think he is interested right now, but I did give him some patient education, including DBS videos.  -In regards to medications, he has failed propranolol because of lowering her blood pressure.  He tried primidone, but only for a few days and thought it caused bad dreams.  I told him I thought we should go back and try again.  We will try primidone, 50 mg, half tablet at night for 1 week and then increase to 1 tablet at night.  I certainly do not think that this is going to be enough, but this is enough to get a started.  He will call me in 2 weeks or email me and let me know how he is doing.  I wrote the prescription for 50 mg twice per day, but he will start with this lower dosage first.  Discussed extensively risk, benefits, and side effects.  He expressed understanding.  2.  Memory change  -He expressed some concern about memory change.  He states that his wife has mentioned it to him.  He was able to provide his history very well today.  We discussed the value of neurocognitive testing, but ultimately decided to hold on that.  Would really like to have his wife present to corroborate/add to the history and right now she is not able to be present for in person visits.  I will try to get a copy of lab work that he has had done already for his primary care, to see if we need to add further lab work in this regard.  He was agreeable.  3.  I will see the patient back in the next 5 months, sooner should new neurologic issues arise.  Retrograded than 50% of the 45-minute visit was spent in counseling with the patient.   Cc:  Antony Contras, MD

## 2018-07-26 ENCOUNTER — Other Ambulatory Visit: Payer: Self-pay

## 2018-07-26 ENCOUNTER — Ambulatory Visit (INDEPENDENT_AMBULATORY_CARE_PROVIDER_SITE_OTHER): Payer: Medicare Other | Admitting: Neurology

## 2018-07-26 ENCOUNTER — Encounter: Payer: Self-pay | Admitting: Neurology

## 2018-07-26 VITALS — BP 110/71 | HR 84 | Temp 98.3°F | Wt 184.2 lb

## 2018-07-26 DIAGNOSIS — R413 Other amnesia: Secondary | ICD-10-CM

## 2018-07-26 DIAGNOSIS — G250 Essential tremor: Secondary | ICD-10-CM

## 2018-07-26 DIAGNOSIS — I2510 Atherosclerotic heart disease of native coronary artery without angina pectoris: Secondary | ICD-10-CM

## 2018-07-26 MED ORDER — PRIMIDONE 50 MG PO TABS: 50.0000 mg | ORAL_TABLET | Freq: Two times a day (BID) | ORAL | 3 refills | Status: DC

## 2018-07-26 NOTE — Patient Instructions (Addendum)
Your Diagnosis is Essential Tremors    -take primidone 50 mg 1/2 tablet at night for 1 week, then 1 tablet nightly for 1 week.  Rx sent to pharmacy  Send e-mail via [place] with updates on medication

## 2018-08-30 DIAGNOSIS — K219 Gastro-esophageal reflux disease without esophagitis: Secondary | ICD-10-CM | POA: Diagnosis not present

## 2018-08-30 DIAGNOSIS — R05 Cough: Secondary | ICD-10-CM | POA: Diagnosis not present

## 2018-08-30 DIAGNOSIS — I10 Essential (primary) hypertension: Secondary | ICD-10-CM | POA: Diagnosis not present

## 2018-09-13 DIAGNOSIS — M545 Low back pain: Secondary | ICD-10-CM | POA: Diagnosis not present

## 2018-09-23 DIAGNOSIS — K5900 Constipation, unspecified: Secondary | ICD-10-CM | POA: Diagnosis not present

## 2018-09-23 DIAGNOSIS — Z1211 Encounter for screening for malignant neoplasm of colon: Secondary | ICD-10-CM | POA: Diagnosis not present

## 2018-10-04 DIAGNOSIS — C61 Malignant neoplasm of prostate: Secondary | ICD-10-CM | POA: Diagnosis not present

## 2018-10-07 ENCOUNTER — Telehealth: Payer: Self-pay | Admitting: Neurology

## 2018-10-07 NOTE — Telephone Encounter (Signed)
Put him in for a virtual visit next week to discuss

## 2018-10-07 NOTE — Telephone Encounter (Signed)
Left message for patient to call back will try back later

## 2018-10-07 NOTE — Telephone Encounter (Signed)
Patient is calling in about medication questions. He wouldn't give any more info than that. Thanks!

## 2018-10-07 NOTE — Telephone Encounter (Signed)
Called spoke with patient he states he is doing fine with medication. Right hand is doing better, able to write much better, very little affect on left hand  Current Rx: Primidone 50 mg BID  Pt wondering if he can increase dose to see if it will help with affect of left hand  Ok to leave message if he does not answer

## 2018-10-07 NOTE — Telephone Encounter (Signed)
Patient is scheduled for VV

## 2018-10-08 ENCOUNTER — Other Ambulatory Visit: Payer: Self-pay

## 2018-10-08 ENCOUNTER — Telehealth (INDEPENDENT_AMBULATORY_CARE_PROVIDER_SITE_OTHER): Payer: Medicare Other | Admitting: Neurology

## 2018-10-08 ENCOUNTER — Encounter: Payer: Self-pay | Admitting: Neurology

## 2018-10-08 VITALS — Ht 71.0 in | Wt 180.0 lb

## 2018-10-08 DIAGNOSIS — C61 Malignant neoplasm of prostate: Secondary | ICD-10-CM | POA: Diagnosis not present

## 2018-10-08 DIAGNOSIS — N3942 Incontinence without sensory awareness: Secondary | ICD-10-CM | POA: Diagnosis not present

## 2018-10-08 DIAGNOSIS — I2510 Atherosclerotic heart disease of native coronary artery without angina pectoris: Secondary | ICD-10-CM | POA: Diagnosis not present

## 2018-10-08 DIAGNOSIS — G250 Essential tremor: Secondary | ICD-10-CM | POA: Diagnosis not present

## 2018-10-08 NOTE — Progress Notes (Signed)
Virtual Visit via Video Note The purpose of this virtual visit is to provide medical care while limiting exposure to the novel coronavirus.    Consent was obtained for video visit:  Yes.   Answered questions that patient had about telehealth interaction:  Yes.   I discussed the limitations, risks, security and privacy concerns of performing an evaluation and management service by telemedicine. I also discussed with the patient that there may be a patient responsible charge related to this service. The patient expressed understanding and agreed to proceed.  Pt location: Home Physician Location: home Name of referring provider:  Antony Contras, MD I connected with [name] at patients initiation/request on 10/08/2018 at  9:45 AM EDT by video enabled telemedicine application and verified that I am speaking with the correct person using two identifiers. Pt MRN:  [id number] Pt DOB:  October 04, 1945 Video Participants:  [name];     History of Present Illness:  Patient seen today for a work in visit.  He was started on primidone last visit.  He is now at 50 mg twice per day.  He just emailed me and asked me if he could go up on the medication.  States today that the medication is "definitely helping the right hand" and he can write better but is still having trouble with the left hand.  No SE with the medication.  No bad dreams like he had with the medication in the past.   Current Outpatient Medications on File Prior to Visit  Medication Sig Dispense Refill  . acetaminophen (TYLENOL) 500 MG tablet Take 1,000 mg by mouth every 6 (six) hours as needed for moderate pain or headache.    Marland Kitchen ascorbic acid (VITAMIN C) 1000 MG tablet Take 1,000 mg by mouth 3 (three) times a week.    . Calcium Carbonate-Vitamin D (CALCIUM 600+D3 PO) Take 1 tablet by mouth daily.     Marland Kitchen LORazepam (ATIVAN) 0.5 MG tablet Take 0.5 mg by mouth at bedtime as needed for anxiety (restless legs).    .  losartan-hydrochlorothiazide (HYZAAR) 50-12.5 MG tablet Take 1 tablet by mouth daily.    . meloxicam (MOBIC) 15 MG tablet Take 15 mg by mouth daily.    . Multiple Vitamin (MULTIVITAMIN WITH MINERALS) TABS tablet Take 1 tablet by mouth 3 (three) times a week.    . primidone (MYSOLINE) 50 MG tablet Take 1 tablet (50 mg total) by mouth 2 (two) times a day. 60 tablet 3  . simvastatin (ZOCOR) 20 MG tablet Take 20 mg by mouth at bedtime.     Marland Kitchen zolpidem (AMBIEN CR) 12.5 MG CR tablet Take 6.25 mg by mouth at bedtime.      No current facility-administered medications on file prior to visit.      Observations/Objective:   Vitals:   10/08/18 0819  Weight: 180 lb (81.6 kg)  Height: 5\' 11"  (1.803 m)   GEN:  The patient appears stated age and is in NAD.  Neurological examination:  Orientation: The patient is alert and oriented x3. Cranial nerves: There is good facial symmetry. There is no facial hypomimia.  The speech is fluent and clear. Soft palate rises symmetrically and there is no tongue deviation. Hearing is intact to conversational tone. Motor: Strength is at least antigravity x 4.   Shoulder shrug is equal and symmetric.  There is no pronator drift.  Movement examination: Tone: unable Abnormal movements: There is no rest tremor.  There is no postural tremor on  the right, but he has mild to moderate postural tremor on the left that just slightly increases with intention.  He is able to perform finger-to-nose without trouble with his eyes closed. Coordination:  There is no decremation with RAM's, with hand opening and closing her finger taps. Gait and Station: Not tested.    Assessment and Plan:   1.  Asymmetric essential tremor  -The left is much more involved than the right.  He is found that primidone has definitely helped the right, but he still has fairly significant tremor on the left.  We discussed the goals of medication and discussed that it may not get rid of tremor altogether on  the left.  Nonetheless, he would like to try to increase the primidone.  We will slightly increase the primidone to 50 mg, 2 tablets in the morning and 1 tablet at night.  Discussed risk, benefits, and side effects.  He will let me know if he needs a new prescription.    -We did discuss DBS therapy today.  We discussed that he would also need cognitive testing, as his wife apparently has expressed some memory concerns in the past (although she was present on the video visit and did not express any concerns when I brought this up today).  Follow Up Instructions:  Patient has an appointment in November and he will keep that appointment.   -I discussed the assessment and treatment plan with the patient. The patient was provided an opportunity to ask questions and all were answered. The patient agreed with the plan and demonstrated an understanding of the instructions.   The patient was advised to call back or seek an in-person evaluation if the symptoms worsen or if the condition fails to improve as anticipated.    Total Time spent in visit with the patient was:  15 min, of which more than 50% of the time was spent in counseling as above.   Pt understands and agrees with the plan of care outlined.     Alonza Bogus, DO

## 2018-10-08 NOTE — Telephone Encounter (Signed)
noted 

## 2018-10-17 ENCOUNTER — Other Ambulatory Visit: Payer: Self-pay | Admitting: Neurology

## 2018-10-18 NOTE — Telephone Encounter (Signed)
Requested Prescriptions   Pending Prescriptions Disp Refills  . primidone (MYSOLINE) 50 MG tablet [Pharmacy Med Name: PRIMIDONE 50 MG TABLET] 180 tablet 1    Sig: TAKE 1 TABLET BY MOUTH TWICE A DAY   Rx last filled:07/26/18 #60 3 refills  Pt last seen: 10/08/18   Follow up appt scheduled:01/07/19

## 2018-10-26 DIAGNOSIS — Z1211 Encounter for screening for malignant neoplasm of colon: Secondary | ICD-10-CM | POA: Diagnosis not present

## 2018-10-26 DIAGNOSIS — K648 Other hemorrhoids: Secondary | ICD-10-CM | POA: Diagnosis not present

## 2018-10-26 DIAGNOSIS — K621 Rectal polyp: Secondary | ICD-10-CM | POA: Diagnosis not present

## 2018-10-26 LAB — HM COLONOSCOPY

## 2018-10-29 DIAGNOSIS — K621 Rectal polyp: Secondary | ICD-10-CM | POA: Diagnosis not present

## 2018-11-16 DIAGNOSIS — Z23 Encounter for immunization: Secondary | ICD-10-CM | POA: Diagnosis not present

## 2018-11-22 DIAGNOSIS — M5416 Radiculopathy, lumbar region: Secondary | ICD-10-CM | POA: Diagnosis not present

## 2018-11-22 DIAGNOSIS — M48061 Spinal stenosis, lumbar region without neurogenic claudication: Secondary | ICD-10-CM | POA: Diagnosis not present

## 2018-11-22 DIAGNOSIS — M545 Low back pain: Secondary | ICD-10-CM | POA: Diagnosis not present

## 2018-12-08 DIAGNOSIS — D225 Melanocytic nevi of trunk: Secondary | ICD-10-CM | POA: Diagnosis not present

## 2018-12-08 DIAGNOSIS — D1801 Hemangioma of skin and subcutaneous tissue: Secondary | ICD-10-CM | POA: Diagnosis not present

## 2018-12-08 DIAGNOSIS — L821 Other seborrheic keratosis: Secondary | ICD-10-CM | POA: Diagnosis not present

## 2018-12-08 DIAGNOSIS — L820 Inflamed seborrheic keratosis: Secondary | ICD-10-CM | POA: Diagnosis not present

## 2018-12-08 DIAGNOSIS — Z85828 Personal history of other malignant neoplasm of skin: Secondary | ICD-10-CM | POA: Diagnosis not present

## 2018-12-08 DIAGNOSIS — L570 Actinic keratosis: Secondary | ICD-10-CM | POA: Diagnosis not present

## 2018-12-08 DIAGNOSIS — L430 Hypertrophic lichen planus: Secondary | ICD-10-CM | POA: Diagnosis not present

## 2018-12-08 DIAGNOSIS — Z85820 Personal history of malignant melanoma of skin: Secondary | ICD-10-CM | POA: Diagnosis not present

## 2018-12-08 DIAGNOSIS — C44519 Basal cell carcinoma of skin of other part of trunk: Secondary | ICD-10-CM | POA: Diagnosis not present

## 2018-12-08 DIAGNOSIS — D692 Other nonthrombocytopenic purpura: Secondary | ICD-10-CM | POA: Diagnosis not present

## 2018-12-08 DIAGNOSIS — D485 Neoplasm of uncertain behavior of skin: Secondary | ICD-10-CM | POA: Diagnosis not present

## 2018-12-24 DIAGNOSIS — C61 Malignant neoplasm of prostate: Secondary | ICD-10-CM | POA: Diagnosis not present

## 2018-12-24 DIAGNOSIS — E7800 Pure hypercholesterolemia, unspecified: Secondary | ICD-10-CM | POA: Diagnosis not present

## 2018-12-24 DIAGNOSIS — I10 Essential (primary) hypertension: Secondary | ICD-10-CM | POA: Diagnosis not present

## 2019-01-06 NOTE — Progress Notes (Signed)
[name] was seen today in follow up for Essential tremor.  Pt is currently on primidone, 50 mg, 2 tablets in the morning and 1 tablet at night (slightly increased last visit). 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  No SE with the medication.   Pt denies falls.  Pt denies lightheadedness, near syncope.  No hallucinations.  Mood has been good.   PREVIOUS MEDICATIONS: primidone  ALLERGIES:   Allergies  Allergen Reactions  . Tape Other (See Comments)    Surgical tape=redness/rash at site    CURRENT MEDICATIONS:  Outpatient Encounter Medications as of 01/07/2019  Medication Sig  . acetaminophen (TYLENOL) 500 MG tablet Take 1,000 mg by mouth every 6 (six) hours as needed for moderate pain or headache.  Marland Kitchen ascorbic acid (VITAMIN C) 1000 MG tablet Take 1,000 mg by mouth 3 (three) times a week.  . Calcium Carbonate-Vitamin D (CALCIUM 600+D3 PO) Take 1 tablet by mouth daily.   Marland Kitchen LORazepam (ATIVAN) 0.5 MG tablet Take 0.5 mg by mouth at bedtime as needed for anxiety (restless legs).  . losartan-hydrochlorothiazide (HYZAAR) 50-12.5 MG tablet Take 1 tablet by mouth daily.  . meloxicam (MOBIC) 15 MG tablet Take 15 mg by mouth daily.  . Multiple Vitamin (MULTIVITAMIN WITH MINERALS) TABS tablet Take 1 tablet by mouth 3 (three) times a week.  . primidone (MYSOLINE) 50 MG tablet Take 2 tablets in AM, 1 in PM  . simvastatin (ZOCOR) 20 MG tablet Take 20 mg by mouth at bedtime.   Marland Kitchen zolpidem (AMBIEN CR) 12.5 MG CR tablet Take 6.25 mg by mouth at bedtime.    No facility-administered encounter medications on file as of 01/07/2019.     PAST MEDICAL HISTORY:   Past Medical History:  Diagnosis Date  . Arthritis    hands   . Cancer [facility])    prostate cancer   . Hyperlipidemia   . Hypertension   . Prostate cancer ([place])   . Recurrent upper respiratory infection (URI)    pt had a cold recent- week  ago - better now   . Tremor     PAST SURGICAL HISTORY:   Past Surgical History:  Procedure Laterality Date  . CATARACT EXTRACTION Bilateral   . LUMBAR LAMINECTOMY/DECOMPRESSION MICRODISCECTOMY Left 04/01/2018   Procedure: Left Lumbar four-lumbar five Microdiscectomy;  Surgeon: Erline Levine, MD;  Location: [place];  Service: Neurosurgery;  Laterality: Left;  . OTHER SURGICAL HISTORY  2005   right hand middle finger surgery   . REPAIR OF CEREBROSPINAL FLUID LEAK N/A 05/18/2018   Procedure: Exploration of Lumbar four-five with repair of cerebrospinal fluid leak;  Surgeon: Erline Levine, MD;  Location: [place];  Service: Neurosurgery;  Laterality: N/A;  . ROBOT ASSISTED LAPAROSCOPIC RADICAL PROSTATECTOMY  01/13/2011   Procedure: ROBOTIC ASSISTED LAPAROSCOPIC RADICAL PROSTATECTOMY LEVEL 2;  Surgeon: Dutch Gray, MD;  Location: [facility];  Service: Urology;  Laterality: Bilateral;  Robotic Assisted Laparoscopic Prostatetectomy with Bilateral Lymphadenectomy  Level 2    SOCIAL HISTORY:   Social History   Socioeconomic History  . Marital status: Married    Spouse name: Not on file  . Number of children: 2  . Years of education: Bachelors  . Highest education level: Bachelor's degree (e.g., BA, AB, BS)  Occupational History  . Occupation: Retired    Comment: Glass blower/designer  . Emergency planning/management officer  strain: Not on file  . Food insecurity    Worry: Not on file    Inability: Not on file  . Transportation needs    Medical: Not on file    Non-medical: Not on file  Tobacco Use  . Smoking status: Never Smoker  . Smokeless tobacco: Never Used  Substance and Sexual Activity  . Alcohol use: Yes    Comment: 1 drink per day or less  . Drug use: No  . Sexual activity: Never  Lifestyle  . Physical activity    Days per week: Not on file    Minutes per session: Not on file  . Stress: Not on file  Relationships  . Social Herbalist on phone: Not on file    Gets together: Not on file     Attends religious service: Not on file    Active member of club or organization: Not on file    Attends meetings of clubs or organizations: Not on file    Relationship status: Not on file  . Intimate partner violence    Fear of current or ex partner: Not on file    Emotionally abused: Not on file    Physically abused: Not on file    Forced sexual activity: Not on file  Other Topics Concern  . Not on file  Social History Narrative   Lives at home with his wife.   Right-handed.   No caffeine use.    FAMILY HISTORY:   Family Status  Relation Name Status  . Father  Deceased at age 55  . Mother  Deceased at age 7  . Sister  Alive  . MGM  Deceased  . MGF  Deceased  . PGM  Deceased  . PGF  Deceased  . Child 2 Alive    ROS:  Review of Systems  Constitutional: Negative.   HENT: Negative.   Eyes: Negative.   Cardiovascular: Negative.   Gastrointestinal: Negative.   Genitourinary: Negative.   Skin: Negative.     PHYSICAL EXAMINATION:    VITALS:   Vitals:   01/07/19 1107  BP: 136/68  Temp: 98.2 F (36.8 C)  Weight: 177 lb 3.2 oz (80.4 kg)  Height: 5\' 11"  (1.803 m)    GEN:  The patient appears stated age and is in NAD. HEENT:  Normocephalic, atraumatic.  The mucous membranes are moist. The superficial temporal arteries are without ropiness or tenderness. CV:  RRR Lungs:  CTAB Neck/HEME:  There are no carotid bruits bilaterally.  Neurological examination:  Orientation: The patient is alert and oriented x3. Cranial nerves: There is good facial symmetry without facial hypomimia. The speech is fluent and clear. Soft palate rises symmetrically and there is no tongue deviation. Hearing is intact to conversational tone. Sensation: Sensation is intact to light touch throughout Motor: Strength is at least antigravity x4.  Movement examination: Tone: There is normal tone in the UE/LE  Abnormal movements: there is L>R postural tremor.  he has difficulty with archimedes  spirals, L more than R.  He is able to pour water from one glass to another but tremor is evident Coordination:  There is no decremation with RAM's. Gait and Station: The patient has no difficulty arising out of a deep-seated chair without the use of the hands. The patient's stride length is good.     ASSESSMENT/PLAN:  1.  Asymmetric essential tremor             -The left is much more  involved than the right.  He does think that primidone has helped  We discussed the goals of medication and discussed that it may not get rid of tremor altogether on the left.  Nonetheless, he would like to try to increase the primidone.  We will slightly increase the primidone to 50 mg, 3 tablets in the morning and 1 tablet at night.  Discussed risk, benefits, and side effects.  RX was RF today             -We did discuss DBS therapy today.  he doesn't think that he needs it  2.  F/u 6 months  Cc:  Antony Contras, MD

## 2019-01-07 ENCOUNTER — Ambulatory Visit (INDEPENDENT_AMBULATORY_CARE_PROVIDER_SITE_OTHER): Payer: Medicare Other | Admitting: Neurology

## 2019-01-07 ENCOUNTER — Other Ambulatory Visit: Payer: Self-pay

## 2019-01-07 ENCOUNTER — Encounter: Payer: Self-pay | Admitting: Neurology

## 2019-01-07 VITALS — BP 136/68 | Temp 98.2°F | Ht 71.0 in | Wt 177.2 lb

## 2019-01-07 DIAGNOSIS — G250 Essential tremor: Secondary | ICD-10-CM

## 2019-01-07 DIAGNOSIS — I2510 Atherosclerotic heart disease of native coronary artery without angina pectoris: Secondary | ICD-10-CM

## 2019-01-07 MED ORDER — PRIMIDONE 50 MG PO TABS: ORAL_TABLET | ORAL | 1 refills | Status: DC

## 2019-01-07 NOTE — Patient Instructions (Addendum)
increase the primidone to 50 mg, 3 tablets in the morning and 1 tablet at night  The physicians and staff at [facility] are committed to providing excellent care. You may receive a survey requesting feedback about your experience at our office. We strive to receive "very good" responses to the survey questions. If you feel that your experience would prevent you from giving the office a "very good " response, please contact our office to try to remedy the situation. We may be reached at [phone]. Thank you for taking the time out of your busy day to complete the survey.

## 2019-01-11 DIAGNOSIS — C61 Malignant neoplasm of prostate: Secondary | ICD-10-CM | POA: Diagnosis not present

## 2019-01-14 DIAGNOSIS — D696 Thrombocytopenia, unspecified: Secondary | ICD-10-CM | POA: Diagnosis not present

## 2019-01-14 DIAGNOSIS — G4700 Insomnia, unspecified: Secondary | ICD-10-CM | POA: Diagnosis not present

## 2019-01-14 DIAGNOSIS — Z0000 Encounter for general adult medical examination without abnormal findings: Secondary | ICD-10-CM | POA: Diagnosis not present

## 2019-01-14 DIAGNOSIS — M25552 Pain in left hip: Secondary | ICD-10-CM | POA: Diagnosis not present

## 2019-01-14 DIAGNOSIS — I10 Essential (primary) hypertension: Secondary | ICD-10-CM | POA: Diagnosis not present

## 2019-01-14 DIAGNOSIS — Z961 Presence of intraocular lens: Secondary | ICD-10-CM | POA: Diagnosis not present

## 2019-01-14 DIAGNOSIS — Z1389 Encounter for screening for other disorder: Secondary | ICD-10-CM | POA: Diagnosis not present

## 2019-01-14 DIAGNOSIS — Z1211 Encounter for screening for malignant neoplasm of colon: Secondary | ICD-10-CM | POA: Diagnosis not present

## 2019-01-14 DIAGNOSIS — E7800 Pure hypercholesterolemia, unspecified: Secondary | ICD-10-CM | POA: Diagnosis not present

## 2019-01-14 DIAGNOSIS — H5201 Hypermetropia, right eye: Secondary | ICD-10-CM | POA: Diagnosis not present

## 2019-01-14 DIAGNOSIS — F439 Reaction to severe stress, unspecified: Secondary | ICD-10-CM | POA: Diagnosis not present

## 2019-01-14 DIAGNOSIS — G250 Essential tremor: Secondary | ICD-10-CM | POA: Diagnosis not present

## 2019-01-14 DIAGNOSIS — H524 Presbyopia: Secondary | ICD-10-CM | POA: Diagnosis not present

## 2019-01-14 DIAGNOSIS — C61 Malignant neoplasm of prostate: Secondary | ICD-10-CM | POA: Diagnosis not present

## 2019-01-17 DIAGNOSIS — I10 Essential (primary) hypertension: Secondary | ICD-10-CM | POA: Diagnosis not present

## 2019-01-17 DIAGNOSIS — C61 Malignant neoplasm of prostate: Secondary | ICD-10-CM | POA: Diagnosis not present

## 2019-01-17 DIAGNOSIS — E7800 Pure hypercholesterolemia, unspecified: Secondary | ICD-10-CM | POA: Diagnosis not present

## 2019-01-25 ENCOUNTER — Telehealth: Payer: Self-pay | Admitting: Internal Medicine

## 2019-01-25 NOTE — Telephone Encounter (Signed)
Patient called because his hands and feet are staying cold and he is concerned he has a circulation issue. He would like to see if there is anything he can do for this.

## 2019-01-25 NOTE — Telephone Encounter (Signed)
Noticed over last 3-4 weeks hands and feet have been really cold all the time.  Feet ok with socks and slippers.   Read online that it could be circulation problem or possibly plaque deposits in arteries.   Usual color.  Notices much more in hands. All extremities around the same time.  Started new medication - primidone 3-4 months ago and meloxicam for low back pain.  Adv that dont think either of these would cause this.  States BP is normal. Hgb is low normal and platelets are low at 82 per his medicare physical at PCP. Adv him to speak with his primary physician regarding this and regarding his symptoms.  He has not spoken to them yet about his cold hands.   Pt aware I will forward to Dr. Harrington Challenger to inform and if there are any new recommendations I will call him back.

## 2019-01-26 NOTE — Telephone Encounter (Signed)
MyChart message to patient to inform.

## 2019-01-26 NOTE — Telephone Encounter (Signed)
Patient may have spasm of the small vessels to hands and feet    I do not think it represents PAD.   I have reviewed CT of abdomen that was in May 2020  No signif vascular dz of aorta or its branches in abdomen   I do not think he has it elsewhere   Symptoms of major vessel PAD are diffierent     Agree  COntact PCP   Make sure Hgb is OK   Check thyroid  Tried to call pt   No one available

## 2019-02-11 ENCOUNTER — Other Ambulatory Visit: Payer: Self-pay | Admitting: Neurology

## 2019-03-16 ENCOUNTER — Telehealth: Payer: Self-pay | Admitting: Internal Medicine

## 2019-03-16 NOTE — Progress Notes (Signed)
Virtual Visit via Video Note   This visit type was conducted due to national recommendations for restrictions regarding the COVID-19 Pandemic (e.g. social distancing) in an effort to limit this patient's exposure and mitigate transmission in our community.  Due to his co-morbid illnesses, this patient is at least at moderate risk for complications without adequate follow up.  This format is felt to be most appropriate for this patient at this time.  All issues noted in this document were discussed and addressed.  A limited physical exam was performed with this format.  Please refer to the patient's chart for his consent to telehealth for [facility].   Date:  03/17/2019   ID:  [name], [name] Aug 07, 1945, MRN [id number]  Patient Location: Home Provider Location: Office  PCP:  David Contras, MD  Cardiologist:  David Carnes, MD  Electrophysiologist:  None   Evaluation Performed:  Follow-Up Visit  Chief Complaint:  F/U of hyperlipidemia and hypertensin   History of Present Illness:    [name] is a 74 y.o. male with a history of CP and HL   Pt has mild arterial calcificatoins on CT from Nov 2018    The pt was last seen in clinic in March 2019   Currently the pt is suffering from probable cold (congestion) Prior to this he was doing well   Denies CP   Breathing was OK He does complain of cold fingers   New for him  No pains in hands or sores in hands  No problems in fee   The patient {has cold symptoms  (congestion)  Past Medical History:  Diagnosis Date  . Arthritis    hands   . Cancer [facility])    prostate cancer   . Hyperlipidemia   . Hypertension   . Prostate cancer ([place])   . Recurrent upper respiratory infection (URI)    pt had a cold recent- week ago - better now   . Tremor    Past Surgical History:  Procedure Laterality Date  . CATARACT EXTRACTION Bilateral   . LUMBAR LAMINECTOMY/DECOMPRESSION MICRODISCECTOMY Left 04/01/2018   Procedure: Left Lumbar four-lumbar  five Microdiscectomy;  Surgeon: David Levine, MD;  Location: [place];  Service: Neurosurgery;  Laterality: Left;  . OTHER SURGICAL HISTORY  2005   right hand middle finger surgery   . REPAIR OF CEREBROSPINAL FLUID LEAK N/A 05/18/2018   Procedure: Exploration of Lumbar four-five with repair of cerebrospinal fluid leak;  Surgeon: David Levine, MD;  Location: [place];  Service: Neurosurgery;  Laterality: N/A;  . ROBOT ASSISTED LAPAROSCOPIC RADICAL PROSTATECTOMY  01/13/2011   Procedure: ROBOTIC ASSISTED LAPAROSCOPIC RADICAL PROSTATECTOMY LEVEL 2;  Surgeon: David Gray, MD;  Location: [facility];  Service: Urology;  Laterality: Bilateral;  Robotic Assisted Laparoscopic Prostatetectomy with Bilateral Lymphadenectomy  Level 2     Current Meds  Medication Sig  . acetaminophen (TYLENOL) 500 MG tablet Take 1,000 mg by mouth every 6 (six) hours as needed for moderate pain or headache.  Marland Kitchen ascorbic acid (VITAMIN C) 1000 MG tablet Take 1,000 mg by mouth 5 (five) times daily.   . Calcium Carbonate-Vitamin D (CALCIUM 600+D3 PO) Take 1 tablet by mouth daily.   Marland Kitchen LORazepam (ATIVAN) 0.5 MG tablet Take 0.5 mg by mouth at bedtime as needed for anxiety (restless legs).  . losartan-hydrochlorothiazide (HYZAAR) 50-12.5 MG tablet Take 1 tablet by mouth daily.  . meloxicam (MOBIC) 15 MG tablet Take 15 mg by mouth daily.  . Multiple Vitamin (MULTIVITAMIN  WITH MINERALS) TABS tablet Take 1 tablet by mouth 2 (two) times a week.   . primidone (MYSOLINE) 50 MG tablet TAKE 3 TABLETS BY MOUTH IN THE MORNING AND 1 TAB IN THE EVENING  . simvastatin (ZOCOR) 20 MG tablet Take 20 mg by mouth at bedtime.   Marland Kitchen zolpidem (AMBIEN CR) 12.5 MG CR tablet Take 6.25 mg by mouth at bedtime.      Allergies:   Tape   Social History   Tobacco Use  . Smoking status: Never Smoker  . Smokeless tobacco: Never Used  Substance Use Topics  . Alcohol use: Yes    Comment: 1 drink per day or less  . Drug use: No     Family Hx: The patient's family  history includes Cancer in his mother; Healthy in his child; Heart attack in his father; Hypertension in his father and sister.  ROS:   Please see the history of present illness.   All other systems reviewed and are negative.     Labs/Other Tests and Data Reviewed:    EKG:  No ECG reviewed.  Recent Labs: 06/29/2018: ALT 19; BUN 23; Creatinine 1.00; Hemoglobin 12.4; Platelet Count 179; Potassium 4.1; Sodium 138   Recent Lipid Panel Lab Results  Component Value Date/Time   CHOL 134 05/21/2017 09:59 AM   TRIG 119 05/21/2017 09:59 AM   HDL 43 05/21/2017 09:59 AM   CHOLHDL 3.1 05/21/2017 09:59 AM   CHOLHDL 2.9 05/25/2015 08:57 AM   LDLCALC 67 05/21/2017 09:59 AM    Wt Readings from Last 3 Encounters:  03/17/19 173 lb (78.5 kg)  01/07/19 177 lb 3.2 oz (80.4 kg)  10/08/18 180 lb (81.6 kg)     Objective:    Vital Signs:  BP (!) 145/78   Pulse 69   Ht 5\' 11"  (1.803 m)   Wt 173 lb (78.5 kg)   BMI 24.13 kg/m    VITAL SIGNS:  reviewed  ASSESSMENT & PLAN:    1  HTN  BP is a little high   Usually better   It has been very high at home   He has been using cold medication I encouraged him to keep tabson this   If remains high then call  2  HL   LDL 77 in November   Keep on same meds  3   Vasc.   Pt with cold hands   I reassureed him    Told him I did not think this represented PAD  Probable spasm of small vessels   Keep hands warm    COVID-19 Education: The signs and symptoms of COVID-19 were discussed with the patient and how to seek care for testing (follow up with PCP or arrange E-visit).  The importance of social distancing was discussed today.  Time:   Today, I have spent 15 minutes with the patient with telehealth technology discussing the above problems.     Medication Adjustments/Labs and Tests Ordered: Current medicines are reviewed at length with the patient today.  Concerns regarding medicines are outlined above.   Tests Ordered: No orders of the defined types  were placed in this encounter.   Medication Changes: No orders of the defined types were placed in this encounter.   Follow Up:  In Person April 2021   Signed, David Carnes, MD  03/17/2019 10:39 PM    [place] is a 74 y.o. male with a history of CP and HL  Pt has mild arterial calcificatoins on CT from Nov 2018    The pt was last seen in clinic in March 2019   Currently the pt is suffering from probable cold (congestion) Prior to this he was doing well   Denies CP   Breathing was OK He does complain of cold fingers   New for him    Current Meds  Medication Sig  . acetaminophen (TYLENOL) 500 MG tablet Take 1,000 mg by mouth every 6 (six) hours as needed for moderate pain or headache.  Marland Kitchen ascorbic acid (VITAMIN C) 1000 MG tablet Take 1,000 mg by mouth 5 (five) times daily.   . Calcium Carbonate-Vitamin D (CALCIUM 600+D3 PO) Take 1 tablet by mouth daily.   Marland Kitchen LORazepam (ATIVAN) 0.5 MG tablet Take 0.5 mg by mouth at bedtime as needed for anxiety (restless legs).  . losartan-hydrochlorothiazide (HYZAAR) 50-12.5 MG tablet Take 1 tablet by mouth daily.  . meloxicam (MOBIC) 15 MG tablet Take 15 mg by mouth daily.  . Multiple Vitamin (MULTIVITAMIN WITH MINERALS) TABS tablet Take 1 tablet by mouth 2 (two) times a week.   . primidone (MYSOLINE) 50 MG tablet TAKE 3 TABLETS BY MOUTH IN THE MORNING AND 1 TAB IN THE EVENING  . simvastatin (ZOCOR) 20 MG tablet Take 20 mg by mouth at bedtime.   Marland Kitchen zolpidem (AMBIEN CR) 12.5 MG CR tablet Take 6.25 mg by mouth at bedtime.      Allergies:   Tape   Past Medical History:  Diagnosis Date  . Arthritis    hands   . Cancer [facility])    prostate cancer   . Hyperlipidemia   . Hypertension   . Prostate cancer ([place])   . Recurrent upper respiratory infection (URI)    pt had a cold recent- week ago - better now   . Tremor     Past Surgical History:  Procedure Laterality Date  . CATARACT EXTRACTION Bilateral   .  LUMBAR LAMINECTOMY/DECOMPRESSION MICRODISCECTOMY Left 04/01/2018   Procedure: Left Lumbar four-lumbar five Microdiscectomy;  Surgeon: David Levine, MD;  Location: [place];  Service: Neurosurgery;  Laterality: Left;  . OTHER SURGICAL HISTORY  2005   right hand middle finger surgery   . REPAIR OF CEREBROSPINAL FLUID LEAK N/A 05/18/2018   Procedure: Exploration of Lumbar four-five with repair of cerebrospinal fluid leak;  Surgeon: David Levine, MD;  Location: [place];  Service: Neurosurgery;  Laterality: N/A;  . ROBOT ASSISTED LAPAROSCOPIC RADICAL PROSTATECTOMY  01/13/2011   Procedure: ROBOTIC ASSISTED LAPAROSCOPIC RADICAL PROSTATECTOMY LEVEL 2;  Surgeon: David Gray, MD;  [facility];  Service: Urology;  Laterality: Bilateral;  Robotic Assisted Laparoscopic Prostatetectomy with Bilateral Lymphadenectomy  Level 2     Social History:  The patient  reports that he has never smoked. He has never used smokeless tobacco. He reports current alcohol use. He reports that he does not use drugs.   Family History:  The patient's family history includes Cancer in his mother; Healthy in his child; Heart attack in his father; Hypertension in his father and sister.    ROS:  Please see the history of present illness. All other systems are reviewed and  Negative to the above problem except as noted.    PHYSICAL EXAM: VS:  BP (!) 145/78   Pulse 69   Ht 5\' 11"  (1.803 m)   Wt 173 lb (78.5 kg)   BMI 24.13 kg/m   GEN: Well nourished, well developed, in no acute distress  HEENT: normal  Neck: JVP normal  NO, carotid bruits, or masses Cardiac: RRR; no murmurs, rubs, or gallops,no edema  Respiratory:  clear to auscultation bilaterally, normal work of breathing GI: soft, nontender, nondistended, + BS  No hepatomegaly  MS: no deformity Moving all extremities   Skin: warm and dry, no rash Neuro:  Strength and sensation are intact  Tremor present in hands   Psych: euthymic mood, full affect   EKG:  EKG is  ordered today.  SR 60 bpm      Lipid Panel    Component Value Date/Time   CHOL 134 05/21/2017 0959   TRIG 119 05/21/2017 0959   HDL 43 05/21/2017 0959   CHOLHDL 3.1 05/21/2017 0959   CHOLHDL 2.9 05/25/2015 0857   VLDL 15 05/25/2015 0857   LDLCALC 67 05/21/2017 0959      Wt Readings from Last 3 Encounters:  03/17/19 173 lb (78.5 kg)  01/07/19 177 lb 3.2 oz (80.4 kg)  10/08/18 180 lb (81.6 kg)      ASSESSMENT AND PLAN:  1  Atherosclerosis    Present on CT scan   No symptoms to sugg active ischemia   Will follow aorta   Last scan in May (for oncology f/u) showed aorta was 40 mm   Upper normal     2  HTN  BP is a little up today   It was high yesterday   He admits to taking Dayquil, Nyquil   Would recomm he follow after he recovers from cold   If still high will need to have medical Rx adjusted   3  HL Check lipid tdoay  On zocor  4.  Cold hands   I do not think this represents major vascuular dz    Sounds like mild Raynauds   Would follow   Encouraged him to stay active     Will plan for f/u in person later this spring  To check BP   Current medicines are reviewed at length with the patient today.  The patient does not have concerns regarding medicines.  Signed, David Carnes, MD  03/17/2019 7:09 PM    [address], [place], [place]  [address] Phone: [phone]; Fax: [phone]

## 2019-03-16 NOTE — Telephone Encounter (Signed)
Patient reporting scratchy throat and clear runny nose.  No fever, no cough, no SOB, no HA. He thinks he caught a cold from his grandson.   He has concerns about his BP, also last visit was virtual and he's hoping he can come in. BPs averaging 130/65-140/70 but last couple days 170/85.  He is taking both Nyquil and DayQuil.  Adv those cold meds can be causing higher readings.  Also wishes to discuss his constantly cold hands.  This was reviewed by Dr. Harrington Challenger in December.   Pt agrees to virtual visit with Dr. Harrington Challenger with the understanding if needs to be seen in office we will let him know that.

## 2019-03-16 NOTE — Telephone Encounter (Signed)
New Message    Pt is calling and says he woke up with a few cold symptoms  Scratchy/sore throat and runny nose. He says he is sure it is just a cold and is wondering about his appt tomorrow     Please call

## 2019-03-17 ENCOUNTER — Telehealth (INDEPENDENT_AMBULATORY_CARE_PROVIDER_SITE_OTHER): Payer: Medicare Other | Admitting: Internal Medicine

## 2019-03-17 ENCOUNTER — Encounter: Payer: Self-pay | Admitting: Internal Medicine

## 2019-03-17 ENCOUNTER — Other Ambulatory Visit: Payer: Self-pay

## 2019-03-17 DIAGNOSIS — I2510 Atherosclerotic heart disease of native coronary artery without angina pectoris: Secondary | ICD-10-CM | POA: Diagnosis not present

## 2019-03-17 DIAGNOSIS — I10 Essential (primary) hypertension: Secondary | ICD-10-CM

## 2019-03-17 DIAGNOSIS — E782 Mixed hyperlipidemia: Secondary | ICD-10-CM | POA: Diagnosis not present

## 2019-03-17 NOTE — Patient Instructions (Addendum)
Please continue to keep track of your blood pressure and call or send a message if readings continue to be greater than 140/ Plan for your next appointment --Monday June 20, 2019 at 8:00 am with Dr. Harrington Challenger in person   Medication Instructions:  No changes *If you need a refill on your cardiac medications before your next appointment, please call your pharmacy*  Lab Work: none If you have labs (blood work) drawn today and your tests are completely normal, you will receive your results only by: Marland Kitchen MyChart Message (if you have MyChart) OR . A paper copy in the mail If you have any lab test that is abnormal or we need to change your treatment, we will call you to review the results.  Testing/Procedures: none  Follow-Up: At [facility], you and your health needs are our priority.  As part of our continuing mission to provide you with exceptional heart care, we have created designated Provider Care Teams.  These Care Teams include your primary Cardiologist (physician) and Advanced Practice Providers (APPs -  Physician Assistants and Nurse Practitioners) who all work together to provide you with the care you need, when you need it.  Your next appointment:   3 month(s)  The format for your next appointment:   In Person  Provider:   Dorris Carnes, MD  Other Instructions  Please continue to keep track of your blood pressure and call or send a message if readings continue to be greater than 140/ Plan for your next appointment --Monday June 20, 2019 at 8:00 am with Dr. Harrington Challenger in person

## 2019-03-28 ENCOUNTER — Ambulatory Visit: Payer: Medicare Other

## 2019-04-21 DIAGNOSIS — C61 Malignant neoplasm of prostate: Secondary | ICD-10-CM | POA: Diagnosis not present

## 2019-04-21 DIAGNOSIS — I10 Essential (primary) hypertension: Secondary | ICD-10-CM | POA: Diagnosis not present

## 2019-04-21 DIAGNOSIS — E7800 Pure hypercholesterolemia, unspecified: Secondary | ICD-10-CM | POA: Diagnosis not present

## 2019-04-29 ENCOUNTER — Inpatient Hospital Stay: Payer: Medicare Other | Attending: Oncology

## 2019-04-29 ENCOUNTER — Ambulatory Visit (HOSPITAL_COMMUNITY)
Admission: RE | Admit: 2019-04-29 | Discharge: 2019-04-29 | Disposition: A | Discharge status: Home / Self Care | Payer: Medicare Other | Source: Ambulatory Visit | Attending: Oncology | Admitting: Oncology

## 2019-04-29 ENCOUNTER — Other Ambulatory Visit: Payer: Self-pay

## 2019-04-29 DIAGNOSIS — Z888 Allergy status to other drugs, medicaments and biological substances status: Secondary | ICD-10-CM | POA: Diagnosis not present

## 2019-04-29 DIAGNOSIS — Z79899 Other long term (current) drug therapy: Secondary | ICD-10-CM | POA: Insufficient documentation

## 2019-04-29 DIAGNOSIS — C61 Malignant neoplasm of prostate: Secondary | ICD-10-CM | POA: Diagnosis not present

## 2019-04-29 DIAGNOSIS — R918 Other nonspecific abnormal finding of lung field: Secondary | ICD-10-CM | POA: Insufficient documentation

## 2019-04-29 DIAGNOSIS — Z923 Personal history of irradiation: Secondary | ICD-10-CM | POA: Insufficient documentation

## 2019-04-29 DIAGNOSIS — I700 Atherosclerosis of aorta: Secondary | ICD-10-CM | POA: Insufficient documentation

## 2019-04-29 DIAGNOSIS — Z9079 Acquired absence of other genital organ(s): Secondary | ICD-10-CM | POA: Diagnosis not present

## 2019-04-29 LAB — CBC WITH DIFFERENTIAL (CANCER CENTER ONLY)
Abs Immature Granulocytes: 0.02 10*3/uL (ref 0.00–0.07)
Basophils Absolute: 0.1 10*3/uL (ref 0.0–0.1)
Basophils Relative: 1 %
Eosinophils Absolute: 0.2 10*3/uL (ref 0.0–0.5)
Eosinophils Relative: 3 %
HCT: 39.6 % (ref 39.0–52.0)
Hemoglobin: 13.3 g/dL (ref 13.0–17.0)
Immature Granulocytes: 0 %
Lymphocytes Relative: 28 %
Lymphs Abs: 1.6 10*3/uL (ref 0.7–4.0)
MCH: 30.2 pg (ref 26.0–34.0)
MCHC: 33.6 g/dL (ref 30.0–36.0)
MCV: 90 fL (ref 80.0–100.0)
Monocytes Absolute: 0.6 10*3/uL (ref 0.1–1.0)
Monocytes Relative: 11 %
Neutro Abs: 3.2 10*3/uL (ref 1.7–7.7)
Neutrophils Relative %: 57 %
Platelet Count: 190 10*3/uL (ref 150–400)
RBC: 4.4 MIL/uL (ref 4.22–5.81)
RDW: 12.3 % (ref 11.5–15.5)
WBC Count: 5.6 10*3/uL (ref 4.0–10.5)
nRBC: 0 % (ref 0.0–0.2)

## 2019-04-29 LAB — CMP (CANCER CENTER ONLY)
ALT: 13 U/L (ref 0–44)
AST: 20 U/L (ref 15–41)
Albumin: 4.2 g/dL (ref 3.5–5.0)
Alkaline Phosphatase: 52 U/L (ref 38–126)
Anion gap: 7 (ref 5–15)
BUN: 24 mg/dL — ABNORMAL HIGH (ref 8–23)
CO2: 30 mmol/L (ref 22–32)
Calcium: 9.6 mg/dL (ref 8.9–10.3)
Chloride: 103 mmol/L (ref 98–111)
Creatinine: 0.91 mg/dL (ref 0.61–1.24)
GFR, Est AFR Am: >60 mL/min (ref >60)
GFR, Estimated: >60 mL/min (ref >60)
Glucose, Bld: 93 mg/dL (ref 70–99)
Potassium: 4 mmol/L (ref 3.5–5.1)
Sodium: 140 mmol/L (ref 135–145)
Total Bilirubin: 0.4 mg/dL (ref 0.3–1.2)
Total Protein: 6.9 g/dL (ref 6.5–8.1)

## 2019-04-29 MED ORDER — IOHEXOL 300 MG/ML  SOLN
100.0000 mL | Freq: Once | INTRAMUSCULAR | Status: AC | PRN
  Administered 2019-04-29: 100 mL via INTRAVENOUS

## 2019-04-29 MED ORDER — SODIUM CHLORIDE (PF) 0.9 % IJ SOLN
INTRAMUSCULAR | Status: AC
  Filled 2019-04-29: qty 50

## 2019-04-30 LAB — PROSTATE-SPECIFIC AG, SERUM (LABCORP): Prostate Specific Ag, Serum: <0.1 ng/mL (ref 0.0–4.0)

## 2019-05-03 ENCOUNTER — Other Ambulatory Visit: Payer: Self-pay

## 2019-05-03 ENCOUNTER — Inpatient Hospital Stay (HOSPITAL_BASED_OUTPATIENT_CLINIC_OR_DEPARTMENT_OTHER): Payer: Medicare Other | Admitting: Oncology

## 2019-05-03 VITALS — BP 132/75 | HR 59 | Temp 97.8°F | Resp 18 | Ht 71.0 in | Wt 175.1 lb

## 2019-05-03 DIAGNOSIS — C61 Malignant neoplasm of prostate: Secondary | ICD-10-CM

## 2019-05-03 DIAGNOSIS — Z888 Allergy status to other drugs, medicaments and biological substances status: Secondary | ICD-10-CM | POA: Diagnosis not present

## 2019-05-03 DIAGNOSIS — Z923 Personal history of irradiation: Secondary | ICD-10-CM | POA: Diagnosis not present

## 2019-05-03 DIAGNOSIS — Z79899 Other long term (current) drug therapy: Secondary | ICD-10-CM | POA: Diagnosis not present

## 2019-05-03 DIAGNOSIS — R918 Other nonspecific abnormal finding of lung field: Secondary | ICD-10-CM | POA: Diagnosis not present

## 2019-05-03 DIAGNOSIS — I700 Atherosclerosis of aorta: Secondary | ICD-10-CM | POA: Diagnosis not present

## 2019-05-03 NOTE — Progress Notes (Signed)
Hematology and Oncology Follow Up Visit  TARIQUE SUDERMAN [id number] [birth year] 74 y.o. 05/03/2019 8:57 AM    Principle Diagnosis: 74 year old man with prostate cancer and biochemical relapse diagnosed in November 2012.  He was found to have Gleason score 4+3 = 7, clinical stage T2N1.  His final pathology after prostatectomy did show neuroendocrine differentiation.   Prior Therapy: He presented with a PSA of 4.38 and subsequently underwent a radical prostatectomy and lymph node dissection on November of 2012. 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  He did not have any evidence of angiolymphatic invasion or extraprostatic extension. The margins were negative. He had one out of 12 lymph nodes involved with cancer predominantly in the right pelvic wall.  His post operative PSA remained at 0.65 and subsequently to 0.89. And in March of 2013 he was started on Lupron and have been on it since that time. His PSA remains very low 0.01.  His PSA went up to 0.3 in March 2017. He completed salvage radiation therapy completed in June 2017 under the care of Dr. Tammi Klippel. He received planned dose of 68.4 grays. Received intermittent androgen deprivation therapy last treatment was in 2017.  Current therapy: Active surveillance only.  Interim History:  Mr. Braxton is here for return evaluation.  Since the last visit, he continues to feel well without any major complaints.  He denies any recent hospitalization or illnesses.  He denies any abdominal pain or flank pain.  He denies any hematuria or dysuria.  Formal status quality of life remain excellent.   Medications: I have reviewed the patient's current medications.  Current Outpatient Medications  Medication Sig Dispense Refill  . acetaminophen (TYLENOL) 500 MG tablet Take 1,000 mg by mouth every 6 (six) hours as needed for moderate pain or headache.    Marland Kitchen ascorbic acid (VITAMIN C) 1000  MG tablet Take 1,000 mg by mouth 5 (five) times daily.     . Calcium Carbonate-Vitamin D (CALCIUM 600+D3 PO) Take 1 tablet by mouth daily.     Marland Kitchen LORazepam (ATIVAN) 0.5 MG tablet Take 0.5 mg by mouth at bedtime as needed for anxiety (restless legs).    . losartan-hydrochlorothiazide (HYZAAR) 50-12.5 MG tablet Take 1 tablet by mouth daily.    . meloxicam (MOBIC) 15 MG tablet Take 15 mg by mouth daily.    . Multiple Vitamin (MULTIVITAMIN WITH MINERALS) TABS tablet Take 1 tablet by mouth 2 (two) times a week.     . primidone (MYSOLINE) 50 MG tablet TAKE 3 TABLETS BY MOUTH IN THE MORNING AND 1 TAB IN THE EVENING    . simvastatin (ZOCOR) 20 MG tablet Take 20 mg by mouth at bedtime.     Marland Kitchen zolpidem (AMBIEN CR) 12.5 MG CR tablet Take 6.25 mg by mouth at bedtime.      No current facility-administered medications for this visit.     Allergies:  Allergies  Allergen Reactions  . Tape Other (See Comments)    Surgical tape=redness/rash at site      Physical Exam: Blood pressure 132/75, pulse (!) 59, temperature 97.8 F (36.6 C), temperature source Temporal, resp. rate 18, height 5\' 11"  (1.803 m), weight 175 lb 1.6 oz (79.4 kg), SpO2 100 %. ECOG: 0   General appearance: Comfortable appearing without any discomfort Head: Normocephalic without any trauma Oropharynx: Mucous membranes are moist and pink without any thrush or ulcers. Eyes: Pupils are equal and round reactive to light. Lymph nodes: No  cervical, supraclavicular, inguinal or axillary lymphadenopathy.   Heart:regular rate and rhythm.  S1 and S2 without leg edema. Lung: Clear without any rhonchi or wheezes.  No dullness to percussion. Abdomin: Soft, nontender, nondistended with good bowel sounds.  No hepatosplenomegaly. Musculoskeletal: No joint deformity or effusion.  Full range of motion noted. Neurological: No deficits noted on motor, sensory and deep tendon reflex exam. Skin: No petechial rash or dryness.  Appeared moist.    Lab  Results: Lab Results  Component Value Date   WBC 5.6 04/29/2019   HGB 13.3 04/29/2019   HCT 39.6 04/29/2019   MCV 90.0 04/29/2019   PLT 190 04/29/2019     Chemistry      Component Value Date/Time   NA 140 04/29/2019 0904   NA 141 01/06/2017 0820   K 4.0 04/29/2019 0904   K 4.2 01/06/2017 0820   CL 103 04/29/2019 0904   CO2 30 04/29/2019 0904   CO2 26 01/06/2017 0820   BUN 24 (H) 04/29/2019 0904   BUN 28.9 (H) 01/06/2017 0820   CREATININE 0.91 04/29/2019 0904   CREATININE 1.1 01/06/2017 0820      Component Value Date/Time   CALCIUM 9.6 04/29/2019 0904   CALCIUM 9.7 01/06/2017 0820   ALKPHOS 52 04/29/2019 0904   ALKPHOS 41 01/06/2017 0820   AST 20 04/29/2019 0904   AST 19 01/06/2017 0820   ALT 13 04/29/2019 0904   ALT 16 01/06/2017 0820   BILITOT 0.4 04/29/2019 0904   BILITOT 0.43 01/06/2017 0820    Results for Guthridge, Ericberto L (MRN [id number]) as of 05/03/2019 08:48  Ref. Range 04/29/2019 09:04  Prostate Specific Ag, Serum Latest Ref Range: 0.0 - 4.0 ng/mL <0.1     IMPRESSION: 1. No findings of active malignancy in the chest, abdomen, or pelvis. 2. Several tiny pulmonary nodules are stable and considered benign. 3. Other imaging findings of potential clinical significance: Coronary atherosclerosis. Prostatectomy. Degenerative spurring of both hips.  Aortic Atherosclerosis (ICD10-I70.0).   Impression and Plan:  74 year old man with:  1.  Biochemically recurrent prostate cancer with initial diagnosis in 2012.  He remains without any active disease at this time.    He is status post therapy outlined above and currently on active surveillance only.  CT scan obtained on April 29, 2019 was personally reviewed and discussed with the patient and his wife.  His PSA continues to be undetectable.  The natural course of this disease and risk of relapse was assessed at this time.  I recommended continued active surveillance at this time without reinstituting androgen  deprivation.  Additional treatment options would include systemic chemotherapy especially if he has metastatic disease from the neuroendocrine component of his prostate cancer.  2. Androgen depravation: He has received it in the past although none in the last 4 years.  His PSA continues to be undetectable.  I recommended restarting if his PSA starts to rise only.  3. Follow-up: In 10 months for repeat evaluation and imaging studies.  30 minutes were dedicated to the encounter.  The time was spent on reviewing laboratory data reviewing imaging studies and treatment options and future plan of care.   Zola Button, MD 3/9/20218:57 AM

## 2019-05-04 ENCOUNTER — Telehealth: Payer: Self-pay | Admitting: Oncology

## 2019-05-04 NOTE — Telephone Encounter (Signed)
Scheduled appt per 3/9 los.  Sent a message to HIM pool to get a calendar mailed out. 

## 2019-05-12 ENCOUNTER — Encounter: Payer: Self-pay | Admitting: Internal Medicine

## 2019-05-12 ENCOUNTER — Other Ambulatory Visit: Payer: Self-pay

## 2019-05-12 ENCOUNTER — Ambulatory Visit (INDEPENDENT_AMBULATORY_CARE_PROVIDER_SITE_OTHER): Payer: Medicare Other | Admitting: Internal Medicine

## 2019-05-12 VITALS — BP 110/66 | HR 56 | Ht 71.0 in | Wt 174.0 lb

## 2019-05-12 DIAGNOSIS — I2510 Atherosclerotic heart disease of native coronary artery without angina pectoris: Secondary | ICD-10-CM

## 2019-05-12 DIAGNOSIS — E782 Mixed hyperlipidemia: Secondary | ICD-10-CM | POA: Diagnosis not present

## 2019-05-12 MED ORDER — ROSUVASTATIN CALCIUM 10 MG PO TABS: 10.0000 mg | ORAL_TABLET | Freq: Every day | ORAL | 3 refills | Status: DC

## 2019-05-12 NOTE — Patient Instructions (Signed)
Medication Instructions:  Your physician has recommended you make the following change in your medication:  1.) stop simvastatin 2.) start rosuvastatin (Crestor) 10 mg once a day  *If you need a refill on your cardiac medications before your next appointment, please call your pharmacy*   Lab Work: Lipids/liver function in about 8 weeks   Testing/Procedures: none   Follow-Up: At [facility], you and your health needs are our priority.  As part of our continuing mission to provide you with exceptional heart care, we have created designated Provider Care Teams.  These Care Teams include your primary Cardiologist (physician) and Advanced Practice Providers (APPs -  Physician Assistants and Nurse Practitioners) who all work together to provide you with the care you need, when you need it.  Your next appointment:   6 month(s)  The format for your next appointment:   Either In Person or Virtual  Provider:   Dorris Carnes, MD   Other Instructions

## 2019-05-12 NOTE — Progress Notes (Signed)
Cardiology Office Note   Date:  05/12/2019   ID:  [name] 1946-01-13, MRN [id number]  PCP:  Antony Contras, MD  Cardiologist:   Dorris Carnes, MD   F/U of CP     History of Present Illness: [name] is a 74 y.o. male with a history of CP and HL   CT scan in 2018 with mild arterial calcifications .  I saw him for the last time back in November 2020.  In the interval the patient has done okay.  He walks daily.  Notes no problem doing this.  Denies dizziness no chest pain.  The patient had in March 5 a CT scan that showed aortoiliac atherosclerotic changes Calcification of the coronary arteries and aorta also noted      Current Meds  Medication Sig  . acetaminophen (TYLENOL) 500 MG tablet Take 1,000 mg by mouth every 6 (six) hours as needed for moderate pain or headache.  Marland Kitchen ascorbic acid (VITAMIN C) 1000 MG tablet Take 1,000 mg by mouth 5 (five) times daily.   . Calcium Carbonate-Vitamin D (CALCIUM 600+D3 PO) Take 1 tablet by mouth daily.   Marland Kitchen LORazepam (ATIVAN) 0.5 MG tablet Take 0.5 mg by mouth at bedtime as needed for anxiety (restless legs).  . losartan-hydrochlorothiazide (HYZAAR) 50-12.5 MG tablet Take 1 tablet by mouth daily.  . meloxicam (MOBIC) 15 MG tablet Take 15 mg by mouth daily.  . Multiple Vitamin (MULTIVITAMIN WITH MINERALS) TABS tablet Take 1 tablet by mouth 2 (two) times a week.   . primidone (MYSOLINE) 50 MG tablet TAKE 3 TABLETS BY MOUTH IN THE MORNING AND 1 TAB IN THE EVENING  . simvastatin (ZOCOR) 20 MG tablet Take 20 mg by mouth at bedtime.   Marland Kitchen zolpidem (AMBIEN CR) 12.5 MG CR tablet Take 6.25 mg by mouth at bedtime.      Allergies:   Tape   Past Medical History:  Diagnosis Date  . Arthritis    hands   . Cancer [facility]    prostate cancer   . Hyperlipidemia   . Hypertension   . Prostate cancer ([place])   . Recurrent upper respiratory infection (URI)    pt had a cold recent- week ago - better now   . Tremor     Past Surgical History:    Procedure Laterality Date  . CATARACT EXTRACTION Bilateral   . LUMBAR LAMINECTOMY/DECOMPRESSION MICRODISCECTOMY Left 04/01/2018   Procedure: Left Lumbar four-lumbar five Microdiscectomy;  Surgeon: Erline Levine, MD;  Location: [place];  Service: Neurosurgery;  Laterality: Left;  . OTHER SURGICAL HISTORY  2005   right hand middle finger surgery   . REPAIR OF CEREBROSPINAL FLUID LEAK N/A 05/18/2018   Procedure: Exploration of Lumbar four-five with repair of cerebrospinal fluid leak;  Surgeon: Erline Levine, MD;  Location: [place];  Service: Neurosurgery;  Laterality: N/A;  . ROBOT ASSISTED LAPAROSCOPIC RADICAL PROSTATECTOMY  01/13/2011   Procedure: ROBOTIC ASSISTED LAPAROSCOPIC RADICAL PROSTATECTOMY LEVEL 2;  Surgeon: Dutch Gray, MD;  Location: [facility];  Service: Urology;  Laterality: Bilateral;  Robotic Assisted Laparoscopic Prostatetectomy with Bilateral Lymphadenectomy  Level 2     Social History:  The patient  reports that he has never smoked. He has never used smokeless tobacco. He reports current alcohol use. He reports that he does not use drugs.   Family History:  The patient's family history includes Cancer in his mother; Healthy in his child; Heart attack in his father; Hypertension in his father and  sister.    ROS:  Please see the history of present illness. All other systems are reviewed and  Negative to the above problem except as noted.    PHYSICAL EXAM: VS:  BP 110/66   Pulse (!) 56   Ht 5\' 11"  (1.803 m)   Wt 174 lb (78.9 kg)   SpO2 98%   BMI 24.27 kg/m   GEN: Well nourished, well developed, in no acute distress  HEENT: normal  Neck: no JVD, carotid bruits, Cardiac: RRR; no murmurs, rubs, or gallops,no edema  Respiratory:  clear to auscultation bilaterally, normal work of breathing GI: soft, nontender, nondistended, + BS  No hepatomegaly  MS: no deformity Moving all extremities   Skin: warm and dry, no rash Neuro:  Strength and sensation are intact Psych: euthymic mood,  full affect   EKG:  EKG is ordered today.  Sinus bradycardia 56 bpm first-degree AV block with a PR interval of 200 ms   Lipid Panel    Component Value Date/Time   CHOL 134 05/21/2017 0959   TRIG 119 05/21/2017 0959   HDL 43 05/21/2017 0959   CHOLHDL 3.1 05/21/2017 0959   CHOLHDL 2.9 05/25/2015 0857   VLDL 15 05/25/2015 0857   LDLCALC 67 05/21/2017 0959      Wt Readings from Last 3 Encounters:  05/12/19 174 lb (78.9 kg)  05/03/19 175 lb 1.6 oz (79.4 kg)  03/17/19 173 lb (78.5 kg)      ASSESSMENT AND PLAN:  1  HTN blood pressure is well controlled  2  HL patient with recent CT scan that showed calcification of the coronary arteries.  He is asymptomatic.  We will try for tighter control of his lipids.  Stop simvastatin and start Crestor 10 follow-up lipids with liver panel in 8 weeks time  3.  CAD.  Again found on CT scan patient asymptomatic continue to follow he is taking meloxicam right now.  I would hold off starting aspirin.  We will recheck him in the fall.  Told him if he has an event of chest pain/discomfort/new sensation that he should chew 4 baby aspirins seek attention  4 bradycardia patient heart rate today is in the 50s he said he is notices more at home.  I do not think he is having any symptoms would follow clinically     Current medicines are reviewed at length with the patient today.  The patient does not have concerns regarding medicines.  Signed, Dorris Carnes, MD  05/12/2019 10:21 AM    [address], [place], [place]  [address] Phone: [phone]; Fax: [phone]

## 2019-06-01 DIAGNOSIS — G4700 Insomnia, unspecified: Secondary | ICD-10-CM | POA: Diagnosis not present

## 2019-06-01 DIAGNOSIS — I10 Essential (primary) hypertension: Secondary | ICD-10-CM | POA: Diagnosis not present

## 2019-06-01 DIAGNOSIS — E7800 Pure hypercholesterolemia, unspecified: Secondary | ICD-10-CM | POA: Diagnosis not present

## 2019-06-01 DIAGNOSIS — C61 Malignant neoplasm of prostate: Secondary | ICD-10-CM | POA: Diagnosis not present

## 2019-06-08 DIAGNOSIS — D485 Neoplasm of uncertain behavior of skin: Secondary | ICD-10-CM | POA: Diagnosis not present

## 2019-06-08 DIAGNOSIS — L821 Other seborrheic keratosis: Secondary | ICD-10-CM | POA: Diagnosis not present

## 2019-06-08 DIAGNOSIS — D044 Carcinoma in situ of skin of scalp and neck: Secondary | ICD-10-CM | POA: Diagnosis not present

## 2019-06-08 DIAGNOSIS — L570 Actinic keratosis: Secondary | ICD-10-CM | POA: Diagnosis not present

## 2019-06-08 DIAGNOSIS — Z85828 Personal history of other malignant neoplasm of skin: Secondary | ICD-10-CM | POA: Diagnosis not present

## 2019-06-08 DIAGNOSIS — D1801 Hemangioma of skin and subcutaneous tissue: Secondary | ICD-10-CM | POA: Diagnosis not present

## 2019-06-08 DIAGNOSIS — L812 Freckles: Secondary | ICD-10-CM | POA: Diagnosis not present

## 2019-07-04 ENCOUNTER — Other Ambulatory Visit: Payer: Self-pay | Admitting: Neurology

## 2019-07-05 NOTE — Progress Notes (Signed)
Assessment/Plan:    1.  Essential Tremor, asymmetric, left more than right  Patient on primidone, 50 mg, 3 tablets in the morning and 1 tablet at night.  He would like to increase to primidone, 4 tablets in the morning and 1 tablet at night.  He does admit that he is doing much better than he was previously.  If he does not find that the 4 tablets is better than the 3 tablets, he may back down.  He will let me know where he ends up in terms of dosage.    -Surgical interventions discussed.  Patient not interested.  2.  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  Patient really is not interested right now.  They can certainly let me know if they change their mind.  3.  F/u 6 months  Subjective:   [name] was seen today in follow up for essential tremor.  My previous records were reviewed prior to todays visit.  This patient is accompanied in the office by his spouse who supplements the history.  He increased his primidone last visit.   Pt states that "it is definitely helping."  He can write checks.  He can eat but has to eat mostly with a spoon.  He can even still eat soup with a spoon.  Mentions memory change.  Wife thinks it is a bigger concern than patient.  Patient thinks that he just does not hear well.  Cardiology records are reviewed from March 18.  His simvastatin was stopped and Crestor was started.  Current prescribed movement disorder medications: Primidone, 50 mg, 3 tablets in the morning and 1 tablet at night (increased last visit)   PREVIOUS MEDICATIONS: unable to do propranolol because of bradycardia (has been on it before with blood pressure drops)  ALLERGIES:   Allergies  Allergen Reactions  . Tape Other (See Comments)    Surgical tape=redness/rash at site    CURRENT MEDICATIONS:  Outpatient Encounter Medications as of 07/07/2019  Medication Sig  . acetaminophen (TYLENOL) 500 MG tablet Take 1,000 mg by mouth every 6 (six) hours as needed  for moderate pain or headache.  Marland Kitchen ascorbic acid (VITAMIN C) 1000 MG tablet Take 1,000 mg by mouth daily. Pt takes 5 times a week  . Calcium Carbonate-Vitamin D (CALCIUM 600+D3 PO) Take 1 tablet by mouth daily.   Marland Kitchen losartan-hydrochlorothiazide (HYZAAR) 50-12.5 MG tablet Take 1 tablet by mouth daily.  . meloxicam (MOBIC) 15 MG tablet Take 15 mg by mouth daily.  . Multiple Vitamin (MULTIVITAMIN WITH MINERALS) TABS tablet Take 1 tablet by mouth 2 (two) times a week.   . primidone (MYSOLINE) 50 MG tablet TAKE 2 TABLETS BY MOUTH IN THE MORNING AND 1 TAB IN THE EVENING (Patient taking differently: Pt take 3 in the morning and 1 tab in the evening)  . rosuvastatin (CRESTOR) 10 MG tablet Take 1 tablet (10 mg total) by mouth daily.  Marland Kitchen zolpidem (AMBIEN CR) 12.5 MG CR tablet Take 6.25 mg by mouth at bedtime.   Marland Kitchen LORazepam (ATIVAN) 0.5 MG tablet Take 0.5 mg by mouth at bedtime as needed for anxiety (restless legs).   No facility-administered encounter medications on file as of 07/07/2019.     Objective:    PHYSICAL EXAMINATION:    VITALS:   Vitals:   07/07/19 1111  BP: 137/74  Pulse: (!) 57  SpO2: 98%  Weight: 177 lb (80.3 kg)  Height: 5\' 10"  (1.778 m)    GEN:  The patient appears stated age and is in NAD. HEENT:  Normocephalic, atraumatic.  The mucous membranes are moist. The superficial temporal arteries are without ropiness or tenderness. CV:  Loletha Grayer.  regular Lungs:  CTAB Neck/HEME:  There are no carotid bruits bilaterally.  Neurological examination:  Orientation: The patient is alert and oriented x3. Cranial nerves: There is good facial symmetry. The speech is fluent and clear. Soft palate rises symmetrically and there is no tongue deviation. Hearing is intact to conversational tone. Sensation: Sensation is intact to light touch throughout Motor: Strength is at least antigravity x4.  Movement examination: Tone: There is normal tone in the UE/LE Abnormal movements: Mild tremor of  the outstretched hands.  Just slightly increases with intention.  Left worse than right.  Mild to moderate trouble with Archimedes spirals, left worse than right.  Does well when given a weight.  Has a vocal tremor.  Mild head tremor. Coordination:  There is no decremation with RAM's Gait and Station: The patient ambulates well in the hall I have reviewed and interpreted the following labs independently   Chemistry      Component Value Date/Time   NA 140 04/29/2019 0904   NA 141 01/06/2017 0820   K 4.0 04/29/2019 0904   K 4.2 01/06/2017 0820   CL 103 04/29/2019 0904   CO2 30 04/29/2019 0904   CO2 26 01/06/2017 0820   BUN 24 (H) 04/29/2019 0904   BUN 28.9 (H) 01/06/2017 0820   CREATININE 0.91 04/29/2019 0904   CREATININE 1.1 01/06/2017 0820      Component Value Date/Time   CALCIUM 9.6 04/29/2019 0904   CALCIUM 9.7 01/06/2017 0820   ALKPHOS 52 04/29/2019 0904   ALKPHOS 41 01/06/2017 0820   AST 20 04/29/2019 0904   AST 19 01/06/2017 0820   ALT 13 04/29/2019 0904   ALT 16 01/06/2017 0820   BILITOT 0.4 04/29/2019 0904   BILITOT 0.43 01/06/2017 0820      Lab Results  Component Value Date   WBC 5.6 04/29/2019   HGB 13.3 04/29/2019   HCT 39.6 04/29/2019   MCV 90.0 04/29/2019   PLT 190 04/29/2019   No results found for: TSH   Chemistry      Component Value Date/Time   NA 140 04/29/2019 0904   NA 141 01/06/2017 0820   K 4.0 04/29/2019 0904   K 4.2 01/06/2017 0820   CL 103 04/29/2019 0904   CO2 30 04/29/2019 0904   CO2 26 01/06/2017 0820   BUN 24 (H) 04/29/2019 0904   BUN 28.9 (H) 01/06/2017 0820   CREATININE 0.91 04/29/2019 0904   CREATININE 1.1 01/06/2017 0820      Component Value Date/Time   CALCIUM 9.6 04/29/2019 0904   CALCIUM 9.7 01/06/2017 0820   ALKPHOS 52 04/29/2019 0904   ALKPHOS 41 01/06/2017 0820   AST 20 04/29/2019 0904   AST 19 01/06/2017 0820   ALT 13 04/29/2019 0904   ALT 16 01/06/2017 0820   BILITOT 0.4 04/29/2019 0904   BILITOT 0.43 01/06/2017  0820     No results found for: VITAMINB12      Total time spent on today's visit was 30 minutes, including both face-to-face time and nonface-to-face time.  Time included that spent on review of records (prior notes available to me/labs/imaging if pertinent), discussing treatment and goals, answering patient's questions and coordinating care.  Cc:  Antony Contras, MD

## 2019-07-07 ENCOUNTER — Encounter: Payer: Self-pay | Admitting: Neurology

## 2019-07-07 ENCOUNTER — Ambulatory Visit (INDEPENDENT_AMBULATORY_CARE_PROVIDER_SITE_OTHER): Payer: Medicare Other | Admitting: Neurology

## 2019-07-07 ENCOUNTER — Other Ambulatory Visit: Payer: Self-pay

## 2019-07-07 VITALS — BP 137/74 | HR 57 | Ht 70.0 in | Wt 177.0 lb

## 2019-07-07 DIAGNOSIS — R413 Other amnesia: Secondary | ICD-10-CM

## 2019-07-07 DIAGNOSIS — G250 Essential tremor: Secondary | ICD-10-CM | POA: Diagnosis not present

## 2019-07-07 DIAGNOSIS — I2510 Atherosclerotic heart disease of native coronary artery without angina pectoris: Secondary | ICD-10-CM

## 2019-07-07 MED ORDER — PRIMIDONE 50 MG PO TABS: ORAL_TABLET | ORAL | 1 refills | Status: DC

## 2019-07-07 NOTE — Patient Instructions (Signed)
You can increase your primidone to 50 mg, 4 in the AM, 1 tablet at night.

## 2019-07-13 ENCOUNTER — Other Ambulatory Visit: Payer: Medicare Other | Admitting: *Deleted

## 2019-07-13 ENCOUNTER — Other Ambulatory Visit: Payer: Self-pay

## 2019-07-13 DIAGNOSIS — E782 Mixed hyperlipidemia: Secondary | ICD-10-CM | POA: Diagnosis not present

## 2019-07-13 DIAGNOSIS — I2510 Atherosclerotic heart disease of native coronary artery without angina pectoris: Secondary | ICD-10-CM

## 2019-07-13 LAB — HEPATIC FUNCTION PANEL
ALT: 14 IU/L (ref 0–44)
AST: 20 IU/L (ref 0–40)
Albumin: 4.4 g/dL (ref 3.7–4.7)
Alkaline Phosphatase: 60 IU/L (ref 48–121)
Bilirubin Total: 0.5 mg/dL (ref 0.0–1.2)
Bilirubin, Direct: 0.14 mg/dL (ref 0.00–0.40)
Total Protein: 6.7 g/dL (ref 6.0–8.5)

## 2019-07-13 LAB — LIPID PANEL
Chol/HDL Ratio: 3 ratio (ref 0.0–5.0)
Cholesterol, Total: 121 mg/dL (ref 100–199)
HDL: 41 mg/dL (ref >39)
LDL Chol Calc (NIH): 60 mg/dL (ref 0–99)
Triglycerides: 110 mg/dL (ref 0–149)
VLDL Cholesterol Cal: 20 mg/dL (ref 5–40)

## 2019-07-15 DIAGNOSIS — D696 Thrombocytopenia, unspecified: Secondary | ICD-10-CM | POA: Diagnosis not present

## 2019-07-15 DIAGNOSIS — G4700 Insomnia, unspecified: Secondary | ICD-10-CM | POA: Diagnosis not present

## 2019-07-15 DIAGNOSIS — C61 Malignant neoplasm of prostate: Secondary | ICD-10-CM | POA: Diagnosis not present

## 2019-07-15 DIAGNOSIS — M25552 Pain in left hip: Secondary | ICD-10-CM | POA: Diagnosis not present

## 2019-07-15 DIAGNOSIS — I10 Essential (primary) hypertension: Secondary | ICD-10-CM | POA: Diagnosis not present

## 2019-07-15 DIAGNOSIS — G250 Essential tremor: Secondary | ICD-10-CM | POA: Diagnosis not present

## 2019-07-15 DIAGNOSIS — N3942 Incontinence without sensory awareness: Secondary | ICD-10-CM | POA: Diagnosis not present

## 2019-07-15 DIAGNOSIS — E7800 Pure hypercholesterolemia, unspecified: Secondary | ICD-10-CM | POA: Diagnosis not present

## 2019-07-15 DIAGNOSIS — F439 Reaction to severe stress, unspecified: Secondary | ICD-10-CM | POA: Diagnosis not present

## 2019-08-11 DIAGNOSIS — R350 Frequency of micturition: Secondary | ICD-10-CM | POA: Diagnosis not present

## 2019-09-15 DIAGNOSIS — R351 Nocturia: Secondary | ICD-10-CM | POA: Diagnosis not present

## 2019-09-15 DIAGNOSIS — N3946 Mixed incontinence: Secondary | ICD-10-CM | POA: Diagnosis not present

## 2019-09-15 DIAGNOSIS — R350 Frequency of micturition: Secondary | ICD-10-CM | POA: Diagnosis not present

## 2019-11-01 DIAGNOSIS — N3946 Mixed incontinence: Secondary | ICD-10-CM | POA: Diagnosis not present

## 2019-11-01 DIAGNOSIS — N3942 Incontinence without sensory awareness: Secondary | ICD-10-CM | POA: Diagnosis not present

## 2019-11-11 ENCOUNTER — Telehealth: Payer: Self-pay | Admitting: Internal Medicine

## 2019-11-11 NOTE — Telephone Encounter (Signed)
[name] is calling wanting to know if Dr. Harrington Challenger would like his cholesterol checked at his upcoming appt. Please advise.

## 2019-11-11 NOTE — Telephone Encounter (Signed)
Called and advised patient that since med changed at last visit to Crestor, his lipids were checked in May and all was normal.  Adv does not need to be fasting for Nov appointment.

## 2019-12-02 DIAGNOSIS — E7800 Pure hypercholesterolemia, unspecified: Secondary | ICD-10-CM | POA: Diagnosis not present

## 2019-12-02 DIAGNOSIS — I10 Essential (primary) hypertension: Secondary | ICD-10-CM | POA: Diagnosis not present

## 2019-12-02 DIAGNOSIS — G4700 Insomnia, unspecified: Secondary | ICD-10-CM | POA: Diagnosis not present

## 2019-12-02 DIAGNOSIS — C61 Malignant neoplasm of prostate: Secondary | ICD-10-CM | POA: Diagnosis not present

## 2019-12-13 DIAGNOSIS — S61243A Puncture wound with foreign body of left middle finger without damage to nail, initial encounter: Secondary | ICD-10-CM | POA: Diagnosis not present

## 2019-12-13 DIAGNOSIS — Z23 Encounter for immunization: Secondary | ICD-10-CM | POA: Diagnosis not present

## 2019-12-13 DIAGNOSIS — S6992XA Unspecified injury of left wrist, hand and finger(s), initial encounter: Secondary | ICD-10-CM | POA: Diagnosis not present

## 2019-12-16 ENCOUNTER — Other Ambulatory Visit: Payer: Self-pay | Admitting: Neurology

## 2019-12-16 MED ORDER — PRIMIDONE 50 MG PO TABS: ORAL_TABLET | ORAL | 0 refills | Status: DC

## 2019-12-16 NOTE — Telephone Encounter (Signed)
Patient notified that rx has been sent to his pharmacy and voiced understanding.

## 2019-12-16 NOTE — Telephone Encounter (Signed)
Patient needs refill of Primidone sent to [facility] [address].

## 2019-12-16 NOTE — Addendum Note (Signed)
Addended by: Ulice Brilliant T on: 12/16/2019 11:17 AM   Modules accepted: Orders

## 2019-12-16 NOTE — Telephone Encounter (Signed)
Sent rx to [facility] on [address], contacted pharmacy to cancel rx. Rx resent to [facility] on [address]

## 2019-12-19 DIAGNOSIS — Z23 Encounter for immunization: Secondary | ICD-10-CM | POA: Diagnosis not present

## 2019-12-29 DIAGNOSIS — L820 Inflamed seborrheic keratosis: Secondary | ICD-10-CM | POA: Diagnosis not present

## 2019-12-29 DIAGNOSIS — L812 Freckles: Secondary | ICD-10-CM | POA: Diagnosis not present

## 2019-12-29 DIAGNOSIS — D1801 Hemangioma of skin and subcutaneous tissue: Secondary | ICD-10-CM | POA: Diagnosis not present

## 2019-12-29 DIAGNOSIS — L821 Other seborrheic keratosis: Secondary | ICD-10-CM | POA: Diagnosis not present

## 2019-12-29 DIAGNOSIS — L570 Actinic keratosis: Secondary | ICD-10-CM | POA: Diagnosis not present

## 2019-12-29 DIAGNOSIS — Z85828 Personal history of other malignant neoplasm of skin: Secondary | ICD-10-CM | POA: Diagnosis not present

## 2020-01-02 NOTE — Progress Notes (Signed)
Virtual Visit Via Video   The purpose of this virtual visit is to provide medical care while limiting exposure to the novel coronavirus (pt originally scheduled for in person but became ill and had to change to VV).    Consent was obtained for video visit:  Yes.   Answered questions that patient had about telehealth interaction:  Yes.   I discussed the limitations, risks, security and privacy concerns of performing an evaluation and management service by telemedicine. I also discussed with the patient that there may be a patient responsible charge related to this service. The patient expressed understanding and agreed to proceed.  Pt location: Home Physician Location: office Name of referring provider:  Antony Contras, MD I connected with [name] at patients initiation/request on 01/05/2020 at 10:45 AM EST by video enabled telemedicine application and verified that I am speaking with the correct person using two identifiers. Pt MRN:  [id number] Pt DOB:  January 18, 1946 Video Participants:  [name];    Assessment/Plan:    1.  Essential Tremor, asymmetric, left greater than right  -Continue primidone, 50 mg, 4 in the morning and 1 at night.  Feels that the increase helped and not ready for surgical interventions  Subjective:   [name] was seen today in follow up for essential tremor.  My previous records were reviewed prior to todays visit. Pt denies falls.  Pt denies lightheadedness, near syncope.  No hallucinations.  Mood has been good.  Patient is currently able to manage food, drink, and writing without significant difficulty.  Current prescribed movement disorder medications: Primidone, 50 mg, 4 tablets in the morning, 1 tablet at night (increased last visit)  - the increase has helped some - only been doing for 1 month    ALLERGIES:   Allergies  Allergen Reactions  . Tape Other (See Comments)    Surgical tape=redness/rash at site    CURRENT MEDICATIONS:    Outpatient Encounter Medications as of 01/05/2020  Medication Sig  . ascorbic acid (VITAMIN C) 1000 MG tablet Take 1,000 mg by mouth daily. Pt takes 5 times a week  . Calcium Carbonate-Vitamin D (CALCIUM 600+D3 PO) Take 1 tablet by mouth daily. Takes 5 times a week  . LORazepam (ATIVAN) 0.5 MG tablet Take 0.5 mg by mouth at bedtime as needed for anxiety (restless legs).  . losartan-hydrochlorothiazide (HYZAAR) 50-12.5 MG tablet Take 1 tablet by mouth daily.  . Multiple Vitamin (MULTIVITAMIN WITH MINERALS) TABS tablet Take 1 tablet by mouth 2 (two) times a week.   . primidone (MYSOLINE) 50 MG tablet 4 in the AM, 1 at night  . rosuvastatin (CRESTOR) 10 MG tablet Take 1 tablet (10 mg total) by mouth daily.  . Vibegron (GEMTESA) 75 MG TABS Take 1 tablet by mouth daily.  Marland Kitchen zolpidem (AMBIEN CR) 12.5 MG CR tablet Take 6.25 mg by mouth at bedtime.   . [DISCONTINUED] acetaminophen (TYLENOL) 500 MG tablet Take 1,000 mg by mouth every 6 (six) hours as needed for moderate pain or headache. (Patient not taking: Reported on 01/05/2020)  . [DISCONTINUED] meloxicam (MOBIC) 15 MG tablet Take 15 mg by mouth daily. (Patient not taking: Reported on 01/05/2020)   No facility-administered encounter medications on file as of 01/05/2020.     Objective:    PHYSICAL EXAMINATION:    VITALS:   Vitals:   01/05/20 0952  BP: (!) 163/77  Pulse: (!) 59  Weight: 178 lb (80.7 kg)  Height: 5\' 10"  (1.778 m)  GEN:  The patient appears stated age and is in NAD. HEENT:  Normocephalic, atraumatic.    Neurological examination:  Orientation: The patient is alert and oriented x3. Cranial nerves: There is good facial symmetry. The speech is fluent and clear.  Motor: Strength is at least antigravity x4.  Movement examination: Abnormal movements: There is mild to moderate postural tremor on the left. I have reviewed and interpreted the following labs independently   Chemistry      Component Value Date/Time   NA  140 04/29/2019 0904   NA 141 01/06/2017 0820   K 4.0 04/29/2019 0904   K 4.2 01/06/2017 0820   CL 103 04/29/2019 0904   CO2 30 04/29/2019 0904   CO2 26 01/06/2017 0820   BUN 24 (H) 04/29/2019 0904   BUN 28.9 (H) 01/06/2017 0820   CREATININE 0.91 04/29/2019 0904   CREATININE 1.1 01/06/2017 0820      Component Value Date/Time   CALCIUM 9.6 04/29/2019 0904   CALCIUM 9.7 01/06/2017 0820   ALKPHOS 60 07/13/2019 1020   ALKPHOS 41 01/06/2017 0820   AST 20 07/13/2019 1020   AST 20 04/29/2019 0904   AST 19 01/06/2017 0820   ALT 14 07/13/2019 1020   ALT 13 04/29/2019 0904   ALT 16 01/06/2017 0820   BILITOT 0.5 07/13/2019 1020   BILITOT 0.4 04/29/2019 0904   BILITOT 0.43 01/06/2017 0820      Lab Results  Component Value Date   WBC 5.6 04/29/2019   HGB 13.3 04/29/2019   HCT 39.6 04/29/2019   MCV 90.0 04/29/2019   PLT 190 04/29/2019   No results found for: TSH   Chemistry      Component Value Date/Time   NA 140 04/29/2019 0904   NA 141 01/06/2017 0820   K 4.0 04/29/2019 0904   K 4.2 01/06/2017 0820   CL 103 04/29/2019 0904   CO2 30 04/29/2019 0904   CO2 26 01/06/2017 0820   BUN 24 (H) 04/29/2019 0904   BUN 28.9 (H) 01/06/2017 0820   CREATININE 0.91 04/29/2019 0904   CREATININE 1.1 01/06/2017 0820      Component Value Date/Time   CALCIUM 9.6 04/29/2019 0904   CALCIUM 9.7 01/06/2017 0820   ALKPHOS 60 07/13/2019 1020   ALKPHOS 41 01/06/2017 0820   AST 20 07/13/2019 1020   AST 20 04/29/2019 0904   AST 19 01/06/2017 0820   ALT 14 07/13/2019 1020   ALT 13 04/29/2019 0904   ALT 16 01/06/2017 0820   BILITOT 0.5 07/13/2019 1020   BILITOT 0.4 04/29/2019 0904   BILITOT 0.43 01/06/2017 0820         Cc:  Antony Contras, MD

## 2020-01-05 ENCOUNTER — Encounter: Payer: Self-pay | Admitting: Neurology

## 2020-01-05 ENCOUNTER — Telehealth: Payer: Self-pay | Admitting: Internal Medicine

## 2020-01-05 ENCOUNTER — Other Ambulatory Visit: Payer: Self-pay

## 2020-01-05 ENCOUNTER — Telehealth (INDEPENDENT_AMBULATORY_CARE_PROVIDER_SITE_OTHER): Payer: Medicare Other | Admitting: Neurology

## 2020-01-05 VITALS — BP 163/77 | HR 59 | Ht 70.0 in | Wt 178.0 lb

## 2020-01-05 DIAGNOSIS — G250 Essential tremor: Secondary | ICD-10-CM | POA: Diagnosis not present

## 2020-01-05 DIAGNOSIS — I2510 Atherosclerotic heart disease of native coronary artery without angina pectoris: Secondary | ICD-10-CM | POA: Diagnosis not present

## 2020-01-05 NOTE — Telephone Encounter (Signed)
New message:    Patient has a apt in the morning and he is sick. He would like to know can apt become VV. Please call patient back.

## 2020-01-05 NOTE — Telephone Encounter (Signed)
Changed to virtual visit and sent MY CHART consent.

## 2020-01-06 ENCOUNTER — Encounter: Payer: Self-pay | Admitting: Internal Medicine

## 2020-01-06 ENCOUNTER — Telehealth (INDEPENDENT_AMBULATORY_CARE_PROVIDER_SITE_OTHER): Payer: Medicare Other | Admitting: Internal Medicine

## 2020-01-06 VITALS — BP 146/83 | HR 68 | Ht 70.0 in | Wt 178.0 lb

## 2020-01-06 DIAGNOSIS — I2510 Atherosclerotic heart disease of native coronary artery without angina pectoris: Secondary | ICD-10-CM | POA: Diagnosis not present

## 2020-01-06 DIAGNOSIS — I10 Essential (primary) hypertension: Secondary | ICD-10-CM

## 2020-01-06 NOTE — Patient Instructions (Signed)
Medication Instructions:  No changes *If you need a refill on your cardiac medications before your next appointment, please call your pharmacy*   Lab Work: none If you have labs (blood work) drawn today and your tests are completely normal, you will receive your results only by: MyChart Message (if you have MyChart) OR A paper copy in the mail If you have any lab test that is abnormal or we need to change your treatment, we will call you to review the results.   Testing/Procedures: none   Follow-Up: At [facility], you and your health needs are our priority.  As part of our continuing mission to provide you with exceptional heart care, we have created designated Provider Care Teams.  These Care Teams include your primary Cardiologist (physician) and Advanced Practice Providers (APPs -  Physician Assistants and Nurse Practitioners) who all work together to provide you with the care you need, when you need it.  We recommend signing up for the patient portal called "MyChart".  Sign up information is provided on this After Visit Summary.  MyChart is used to connect with patients for Virtual Visits (Telemedicine).  Patients are able to view lab/test results, encounter notes, upcoming appointments, etc.  Non-urgent messages can be sent to your provider as well.   To learn more about what you can do with MyChart, go to [email or web address]://www.mychart.com.    Your next appointment:   9 month(s)  The format for your next appointment:   In Person  Provider:   You may see Paula Ross, MD or one of the following Advanced Practice Providers on your designated Care Team:   Scott Weaver, PA-C Vin Bhagat, PA-C   Other Instructions   

## 2020-01-06 NOTE — Progress Notes (Signed)
Virtual Visit via Video Note   This visit type was conducted due to national recommendations for restrictions regarding the COVID-19 Pandemic (e.g. social distancing) in an effort to limit this patient's exposure and mitigate transmission in our community.  Due to his co-morbid illnesses, this patient is at least at moderate risk for complications without adequate follow up.  This format is felt to be most appropriate for this patient at this time.  All issues noted in this document were discussed and addressed.  A limited physical exam was performed with this format.  Please refer to the patient's chart for his consent to telehealth for [name].       Date:  01/06/2020   ID:  [name], DOB Jan 24, 1946, MRN [id number] The patient was identified using 2 identifiers.  Patient Location: Home Provider Location: Office/Clinic  PCP:  Antony Contras, MD  Cardiologist:  Dorris Carnes, MD  Electrophysiologist:  None   Evaluation Performed:  Follow-Up Visit  Chief Complaint:  F/U of CAD   History of Present Illness: [name] is a 74 y.o. male with a history of CP and HL   CT scan in 2018 with mild arterial calcifications .  I saw him for the last time back in November 2020.  In the interval the patient has done okay.  He walks daily.  Notes no problem doing this.  Denies dizziness no chest pain.  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 Arthritis    hands   . Cancer [facility])    prostate cancer   . Hyperlipidemia   . Hypertension   . Prostate cancer ([place])   . Recurrent upper respiratory infection (URI)    pt had a cold recent- week ago - better now   . Tremor    Past Surgical History:   Procedure Laterality Date  . CATARACT EXTRACTION Bilateral   . LUMBAR LAMINECTOMY/DECOMPRESSION MICRODISCECTOMY Left 04/01/2018   Procedure: Left Lumbar four-lumbar five Microdiscectomy;  Surgeon: Erline Levine, MD;  Location: [place];  Service: Neurosurgery;  Laterality: Left;  . OTHER SURGICAL HISTORY  2005   right hand middle finger surgery   . REPAIR OF CEREBROSPINAL FLUID LEAK N/A 05/18/2018   Procedure: Exploration of Lumbar four-five with repair of cerebrospinal fluid leak;  Surgeon: Erline Levine, MD;  Location: [place];  Service: Neurosurgery;  Laterality: N/A;  . ROBOT ASSISTED LAPAROSCOPIC RADICAL PROSTATECTOMY  01/13/2011   Procedure: ROBOTIC ASSISTED LAPAROSCOPIC RADICAL PROSTATECTOMY LEVEL 2;  Surgeon: Dutch Gray, MD;  Location: [facility];  Service: Urology;  Laterality: Bilateral;  Robotic Assisted Laparoscopic Prostatetectomy with Bilateral Lymphadenectomy  Level 2     No outpatient medications have been marked as taking for the 01/06/20 encounter (Video Visit) with Fay Records, MD.     Allergies:   Tape   Social History   Tobacco Use  . Smoking status: Never Smoker  . Smokeless tobacco: Never Used  Vaping Use  . Vaping Use: Never used  Substance Use Topics  . Alcohol use: Yes    Comment: 1 drink per day or less  .  Drug use: No     Family Hx: The patient's family history includes Cancer in his mother; Healthy in his child; Heart attack in his father; Hypertension in his father and sister.  ROS:   Please see the history of present illness.     All other systems reviewed and are negative.     Labs/Other Tests and Data Reviewed:    EKG:  No ECG reviewed.  Recent Labs: 04/29/2019: BUN 24; Creatinine 0.91; Hemoglobin 13.3; Platelet Count 190; Potassium 4.0; Sodium 140 07/13/2019: ALT 14   Recent Lipid Panel Lab Results  Component Value Date/Time   CHOL 121 07/13/2019 10:20 AM   TRIG 110 07/13/2019 10:20 AM   HDL 41 07/13/2019 10:20 AM   CHOLHDL 3.0 07/13/2019  10:20 AM   CHOLHDL 2.9 05/25/2015 08:57 AM   LDLCALC 60 07/13/2019 10:20 AM    Wt Readings from Last 3 Encounters:  01/06/20 178 lb (80.7 kg)  01/05/20 178 lb (80.7 kg)  07/07/19 177 lb (80.3 kg)     Risk Assessment/Calculations:      Objective:    Vital Signs:  BP (!) 146/83   Pulse 68   Ht 5\' 10"  (1.778 m)   Wt 178 lb (80.7 kg)   BMI 25.54 kg/m    VITAL SIGNS:  reviewed  ASSESSMENT & PLAN:    1. CAD   Pt with calcifications on CT scan  No symptoms of angina  2  HTN   BP is high today he says because of cold medication  Better usually     I recomm he get his cuff checked at next appt   3  Lpids   Good control in May LDL 60  HDL 41  Trig 110   Discussed diet esp sugars   Plan for f/u in Aug since this is a televisit   COVID-19 Education: The signs and symptoms of COVID-19 were discussed with the patient and how to seek care for testing (follow up with PCP or arrange E-visit).  The importance of social distancing was discussed today.  Time:   Today, I have spent  minutes with the patient with telehealth technology discussing the above problems.     Medication Adjustments/Labs and Tests Ordered: Current medicines are reviewed at length with the patient today.  Concerns regarding medicines are outlined above.   Tests Ordered: No orders of the defined types were placed in this encounter.   Medication Changes: No orders of the defined types were placed in this encounter.   Follow Up:  In Person late summer  Signed, Dorris Carnes, MD  01/06/2020 9:04 AM    [place]

## 2020-01-12 DIAGNOSIS — N3942 Incontinence without sensory awareness: Secondary | ICD-10-CM | POA: Diagnosis not present

## 2020-01-12 DIAGNOSIS — N3946 Mixed incontinence: Secondary | ICD-10-CM | POA: Diagnosis not present

## 2020-01-17 DIAGNOSIS — Z23 Encounter for immunization: Secondary | ICD-10-CM | POA: Diagnosis not present

## 2020-01-26 DIAGNOSIS — R32 Unspecified urinary incontinence: Secondary | ICD-10-CM | POA: Diagnosis not present

## 2020-01-26 DIAGNOSIS — Z1389 Encounter for screening for other disorder: Secondary | ICD-10-CM | POA: Diagnosis not present

## 2020-01-26 DIAGNOSIS — M25552 Pain in left hip: Secondary | ICD-10-CM | POA: Diagnosis not present

## 2020-01-26 DIAGNOSIS — G4700 Insomnia, unspecified: Secondary | ICD-10-CM | POA: Diagnosis not present

## 2020-01-26 DIAGNOSIS — C61 Malignant neoplasm of prostate: Secondary | ICD-10-CM | POA: Diagnosis not present

## 2020-01-26 DIAGNOSIS — F439 Reaction to severe stress, unspecified: Secondary | ICD-10-CM | POA: Diagnosis not present

## 2020-01-26 DIAGNOSIS — D696 Thrombocytopenia, unspecified: Secondary | ICD-10-CM | POA: Diagnosis not present

## 2020-01-26 DIAGNOSIS — I10 Essential (primary) hypertension: Secondary | ICD-10-CM | POA: Diagnosis not present

## 2020-01-26 DIAGNOSIS — E7800 Pure hypercholesterolemia, unspecified: Secondary | ICD-10-CM | POA: Diagnosis not present

## 2020-01-26 DIAGNOSIS — Z1211 Encounter for screening for malignant neoplasm of colon: Secondary | ICD-10-CM | POA: Diagnosis not present

## 2020-01-26 DIAGNOSIS — Z0000 Encounter for general adult medical examination without abnormal findings: Secondary | ICD-10-CM | POA: Diagnosis not present

## 2020-01-26 DIAGNOSIS — G250 Essential tremor: Secondary | ICD-10-CM | POA: Diagnosis not present

## 2020-03-05 DIAGNOSIS — Z961 Presence of intraocular lens: Secondary | ICD-10-CM | POA: Diagnosis not present

## 2020-03-06 ENCOUNTER — Ambulatory Visit (HOSPITAL_COMMUNITY)
Admission: RE | Admit: 2020-03-06 | Discharge: 2020-03-06 | Disposition: A | Discharge status: Home / Self Care | Payer: Medicare Other | Source: Ambulatory Visit | Attending: Oncology | Admitting: Oncology

## 2020-03-06 ENCOUNTER — Inpatient Hospital Stay: Payer: Medicare Other | Attending: Oncology

## 2020-03-06 ENCOUNTER — Encounter (HOSPITAL_COMMUNITY): Payer: Self-pay

## 2020-03-06 ENCOUNTER — Other Ambulatory Visit: Payer: Self-pay

## 2020-03-06 DIAGNOSIS — G8929 Other chronic pain: Secondary | ICD-10-CM | POA: Insufficient documentation

## 2020-03-06 DIAGNOSIS — Z923 Personal history of irradiation: Secondary | ICD-10-CM | POA: Diagnosis not present

## 2020-03-06 DIAGNOSIS — M549 Dorsalgia, unspecified: Secondary | ICD-10-CM | POA: Insufficient documentation

## 2020-03-06 DIAGNOSIS — Z888 Allergy status to other drugs, medicaments and biological substances status: Secondary | ICD-10-CM | POA: Diagnosis not present

## 2020-03-06 DIAGNOSIS — Z9079 Acquired absence of other genital organ(s): Secondary | ICD-10-CM | POA: Insufficient documentation

## 2020-03-06 DIAGNOSIS — I2510 Atherosclerotic heart disease of native coronary artery without angina pectoris: Secondary | ICD-10-CM | POA: Insufficient documentation

## 2020-03-06 DIAGNOSIS — R918 Other nonspecific abnormal finding of lung field: Secondary | ICD-10-CM | POA: Diagnosis not present

## 2020-03-06 DIAGNOSIS — I700 Atherosclerosis of aorta: Secondary | ICD-10-CM | POA: Insufficient documentation

## 2020-03-06 DIAGNOSIS — C61 Malignant neoplasm of prostate: Secondary | ICD-10-CM | POA: Insufficient documentation

## 2020-03-06 DIAGNOSIS — Z79899 Other long term (current) drug therapy: Secondary | ICD-10-CM | POA: Diagnosis not present

## 2020-03-06 LAB — CBC WITH DIFFERENTIAL (CANCER CENTER ONLY)
Abs Immature Granulocytes: 0.02 10*3/uL (ref 0.00–0.07)
Basophils Absolute: 0.1 10*3/uL (ref 0.0–0.1)
Basophils Relative: 1 %
Eosinophils Absolute: 0.1 10*3/uL (ref 0.0–0.5)
Eosinophils Relative: 2 %
HCT: 40.2 % (ref 39.0–52.0)
Hemoglobin: 13.4 g/dL (ref 13.0–17.0)
Immature Granulocytes: 0 %
Lymphocytes Relative: 26 %
Lymphs Abs: 1.4 10*3/uL (ref 0.7–4.0)
MCH: 29.5 pg (ref 26.0–34.0)
MCHC: 33.3 g/dL (ref 30.0–36.0)
MCV: 88.5 fL (ref 80.0–100.0)
Monocytes Absolute: 0.5 10*3/uL (ref 0.1–1.0)
Monocytes Relative: 10 %
Neutro Abs: 3.4 10*3/uL (ref 1.7–7.7)
Neutrophils Relative %: 61 %
Platelet Count: 191 10*3/uL (ref 150–400)
RBC: 4.54 MIL/uL (ref 4.22–5.81)
RDW: 12.7 % (ref 11.5–15.5)
WBC Count: 5.5 10*3/uL (ref 4.0–10.5)
nRBC: 0 % (ref 0.0–0.2)

## 2020-03-06 LAB — CMP (CANCER CENTER ONLY)
ALT: 14 U/L (ref 0–44)
AST: 20 U/L (ref 15–41)
Albumin: 4.2 g/dL (ref 3.5–5.0)
Alkaline Phosphatase: 54 U/L (ref 38–126)
Anion gap: 8 (ref 5–15)
BUN: 22 mg/dL (ref 8–23)
CO2: 27 mmol/L (ref 22–32)
Calcium: 10.2 mg/dL (ref 8.9–10.3)
Chloride: 104 mmol/L (ref 98–111)
Creatinine: 0.9 mg/dL (ref 0.61–1.24)
GFR, Estimated: >60 mL/min (ref >60)
Glucose, Bld: 96 mg/dL (ref 70–99)
Potassium: 4.1 mmol/L (ref 3.5–5.1)
Sodium: 139 mmol/L (ref 135–145)
Total Bilirubin: 0.6 mg/dL (ref 0.3–1.2)
Total Protein: 7.4 g/dL (ref 6.5–8.1)

## 2020-03-06 MED ORDER — IOHEXOL 300 MG/ML  SOLN
100.0000 mL | Freq: Once | INTRAMUSCULAR | Status: AC | PRN
  Administered 2020-03-06: 100 mL via INTRAVENOUS

## 2020-03-07 LAB — PROSTATE-SPECIFIC AG, SERUM (LABCORP): Prostate Specific Ag, Serum: <0.1 ng/mL (ref 0.0–4.0)

## 2020-03-08 DIAGNOSIS — M2560 Stiffness of unspecified joint, not elsewhere classified: Secondary | ICD-10-CM | POA: Diagnosis not present

## 2020-03-08 DIAGNOSIS — M6281 Muscle weakness (generalized): Secondary | ICD-10-CM | POA: Diagnosis not present

## 2020-03-08 DIAGNOSIS — M545 Low back pain, unspecified: Secondary | ICD-10-CM | POA: Diagnosis not present

## 2020-03-13 ENCOUNTER — Inpatient Hospital Stay (HOSPITAL_BASED_OUTPATIENT_CLINIC_OR_DEPARTMENT_OTHER): Payer: Medicare Other | Admitting: Oncology

## 2020-03-13 DIAGNOSIS — C61 Malignant neoplasm of prostate: Secondary | ICD-10-CM | POA: Diagnosis not present

## 2020-03-13 NOTE — Progress Notes (Signed)
Hematology and Oncology Follow Up for Telemedicine Visits  [name] [id number] 06-Nov-1945 75 y.o. 03/13/2020 8:25 AM [name], [name], MD   I connected with [name] on 03/13/20 at  8:30 AM EST by telephone visit and verified that I am speaking with the correct person using two identifiers.   I discussed the limitations, risks, security and privacy concerns of performing an evaluation and management service by telemedicine and the availability of in-person appointments. I also discussed with the patient that there may be a patient responsible charge related to this service. The patient expressed understanding and agreed to proceed.  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 He had one out of 12 lymph nodes involved with cancer predominantly in the right pelvic wall.    His post operative PSA remained at 0.65 and subsequently to 0.89. And in March of 2013 he was started on Lupron and have been on it since that time. His PSA remains very low 0.01.   His PSA went up to 0.3 in March 2017. He completed salvage radiation therapy completed in June 2017 under the care of Dr. Tammi Klippel. He received planned dose of 68.4 grays.  Received intermittent androgen deprivation therapy last treatment was in 2017.  Current therapy: Active surveillance only.    Interim History: [name] feels reasonably well without any complaints.  He does have chronic back pain  related to his previous surgery and currently improving steadily.  He denies any recent falls or syncope.  He denies any urination difficulties.  Denies any bone pain or pathological fractures.     Medications: Updated on review. Current Outpatient Medications  Medication Sig Dispense Refill  . ascorbic acid (VITAMIN C) 1000 MG tablet Take 1,000 mg by mouth daily. Pt takes 5 times a week    . Calcium Carbonate-Vitamin D (CALCIUM 600+D3 PO) Take 1 tablet by mouth daily. Takes 5 times a week    . LORazepam (ATIVAN) 0.5 MG tablet Take 0.5 mg by mouth at bedtime as needed for anxiety (restless legs).    . losartan-hydrochlorothiazide (HYZAAR) 50-12.5 MG tablet Take 1 tablet by mouth daily.    . Multiple Vitamin (MULTIVITAMIN WITH MINERALS) TABS tablet Take 1 tablet by mouth 2 (two) times a week.     . primidone (MYSOLINE) 50 MG tablet 4 in the AM, 1 at night 450 tablet 0  . rosuvastatin (CRESTOR) 10 MG tablet Take 1 tablet (10 mg total) by mouth daily. 90 tablet 3  . Vibegron (GEMTESA) 75 MG TABS Take 1 tablet by mouth daily.    Marland Kitchen zolpidem (AMBIEN CR) 12.5 MG CR tablet Take 6.25 mg by mouth at bedtime.      No current facility-administered medications for this visit.     Allergies:  Allergies  Allergen Reactions  . Tape Other (See Comments)    Surgical tape=redness/rash at site      Lab Results: Lab Results  Component Value Date   WBC 5.5 03/06/2020   HGB 13.4 03/06/2020   HCT 40.2 03/06/2020  MCV 88.5 03/06/2020   PLT 191 03/06/2020     Chemistry      Component Value Date/Time   NA 139 03/06/2020 0917   NA 141 01/06/2017 0820   K 4.1 03/06/2020 0917   K 4.2 01/06/2017 0820   CL 104 03/06/2020 0917   CO2 27 03/06/2020 0917   CO2 26 01/06/2017 0820   BUN 22 03/06/2020 0917   BUN 28.9 (H) 01/06/2017 0820   CREATININE 0.90 03/06/2020 0917   CREATININE 1.1 01/06/2017 0820      Component Value Date/Time   CALCIUM 10.2 03/06/2020 0917   CALCIUM 9.7 01/06/2017 0820    ALKPHOS 54 03/06/2020 0917   ALKPHOS 41 01/06/2017 0820   AST 20 03/06/2020 0917   AST 19 01/06/2017 0820   ALT 14 03/06/2020 0917   ALT 16 01/06/2017 0820   BILITOT 0.6 03/06/2020 0917   BILITOT 0.43 01/06/2017 0820     Results for [name], [name] (MRN [id number]) as of 03/13/2020 08:27  Ref. Range 03/06/2020 09:17  Prostate Specific Ag, Serum Latest Ref Range: 0.0 - 4.0 ng/mL <0.1    IMPRESSION: 1. No findings to suggest metastatic disease in the chest, abdomen or pelvis. 2. Tiny 2-3 mm pulmonary nodules in the lungs bilaterally, stable compared to the prior studies, considered benign. 3. Aortic atherosclerosis, in addition to left main and 3 vessel coronary artery disease. Assessment for potential risk factor modification, dietary therapy or pharmacologic therapy may be warranted, if clinically indicated. 4. Additional incidental findings, as above.  Impression and Plan:  75 year old man with:  1.  Biochemically recurrent prostate cancer  after initially diagnosed in 2012 without any evidence of disease at this time.   He is currently on active surveillance without any treatment with last hormonal therapy was in 2017.  CT scan and laboratory testing obtained on March 06, 2020 were personally reviewed and discussed today and showed no evidence of disease relapse.  At this time I recommended continued active surveillance without any intervention.  He is agreeable at this time of this plan.  2. Androgen depravation:  Last treatment given was in 2017 without any indication for additional treatment.  3. Bone health: I continue to encourage calcium and vitamin D supplements given his risk of osteoporosis.  4. Follow-up: Will be in 12 months for repeat follow-up.    I discussed the assessment and treatment plan with the patient. The patient was provided an opportunity to ask questions and all were answered. The patient agreed with the plan and demonstrated an understanding  of the instructions.   The patient was advised to call back or seek an in-person evaluation if the symptoms worsen or if the condition fails to improve as anticipated.  I provided 20 minutes of non face-to-face telephone visit time during this encounter.  The time was dedicated to reviewing imaging studies, laboratory data and future plan of care discussions.  Zola Button, MD 03/13/2020 8:25 AM

## 2020-03-15 DIAGNOSIS — M2560 Stiffness of unspecified joint, not elsewhere classified: Secondary | ICD-10-CM | POA: Diagnosis not present

## 2020-03-15 DIAGNOSIS — M6281 Muscle weakness (generalized): Secondary | ICD-10-CM | POA: Diagnosis not present

## 2020-03-15 DIAGNOSIS — M545 Low back pain, unspecified: Secondary | ICD-10-CM | POA: Diagnosis not present

## 2020-03-19 DIAGNOSIS — M545 Low back pain, unspecified: Secondary | ICD-10-CM | POA: Diagnosis not present

## 2020-03-19 DIAGNOSIS — M6281 Muscle weakness (generalized): Secondary | ICD-10-CM | POA: Diagnosis not present

## 2020-03-19 DIAGNOSIS — M2560 Stiffness of unspecified joint, not elsewhere classified: Secondary | ICD-10-CM | POA: Diagnosis not present

## 2020-03-21 ENCOUNTER — Other Ambulatory Visit: Payer: Self-pay | Admitting: Neurology

## 2020-03-22 DIAGNOSIS — M2560 Stiffness of unspecified joint, not elsewhere classified: Secondary | ICD-10-CM | POA: Diagnosis not present

## 2020-03-22 DIAGNOSIS — M6281 Muscle weakness (generalized): Secondary | ICD-10-CM | POA: Diagnosis not present

## 2020-03-22 DIAGNOSIS — M545 Low back pain, unspecified: Secondary | ICD-10-CM | POA: Diagnosis not present

## 2020-03-22 NOTE — Telephone Encounter (Signed)
Patient is checking the status of getting his primidone sent in to the pharmacy they state they have not gotten anything from [place]

## 2020-03-22 NOTE — Telephone Encounter (Signed)
Patient called back in to see about getting a refill of his primidone sent to [facility] on [address]

## 2020-03-23 NOTE — Telephone Encounter (Addendum)
Received call from patient wanting to know if his rx has been sent to the pharmacy.   Spoke with patient ad informed him that we would send his rx to the pharmacy. He voiced understanding.   Rx(s) sent to pharmacy electronically.

## 2020-03-26 DIAGNOSIS — M545 Low back pain, unspecified: Secondary | ICD-10-CM | POA: Diagnosis not present

## 2020-03-26 DIAGNOSIS — M6281 Muscle weakness (generalized): Secondary | ICD-10-CM | POA: Diagnosis not present

## 2020-03-26 DIAGNOSIS — M2560 Stiffness of unspecified joint, not elsewhere classified: Secondary | ICD-10-CM | POA: Diagnosis not present

## 2020-04-02 DIAGNOSIS — M6281 Muscle weakness (generalized): Secondary | ICD-10-CM | POA: Diagnosis not present

## 2020-04-02 DIAGNOSIS — M2560 Stiffness of unspecified joint, not elsewhere classified: Secondary | ICD-10-CM | POA: Diagnosis not present

## 2020-04-02 DIAGNOSIS — M545 Low back pain, unspecified: Secondary | ICD-10-CM | POA: Diagnosis not present

## 2020-04-09 DIAGNOSIS — M2560 Stiffness of unspecified joint, not elsewhere classified: Secondary | ICD-10-CM | POA: Diagnosis not present

## 2020-04-09 DIAGNOSIS — M6281 Muscle weakness (generalized): Secondary | ICD-10-CM | POA: Diagnosis not present

## 2020-04-09 DIAGNOSIS — M545 Low back pain, unspecified: Secondary | ICD-10-CM | POA: Diagnosis not present

## 2020-04-23 DIAGNOSIS — M6281 Muscle weakness (generalized): Secondary | ICD-10-CM | POA: Diagnosis not present

## 2020-04-23 DIAGNOSIS — M545 Low back pain, unspecified: Secondary | ICD-10-CM | POA: Diagnosis not present

## 2020-04-23 DIAGNOSIS — M2560 Stiffness of unspecified joint, not elsewhere classified: Secondary | ICD-10-CM | POA: Diagnosis not present

## 2020-04-30 DIAGNOSIS — M2560 Stiffness of unspecified joint, not elsewhere classified: Secondary | ICD-10-CM | POA: Diagnosis not present

## 2020-04-30 DIAGNOSIS — M545 Low back pain, unspecified: Secondary | ICD-10-CM | POA: Diagnosis not present

## 2020-04-30 DIAGNOSIS — M6281 Muscle weakness (generalized): Secondary | ICD-10-CM | POA: Diagnosis not present

## 2020-05-07 DIAGNOSIS — M2560 Stiffness of unspecified joint, not elsewhere classified: Secondary | ICD-10-CM | POA: Diagnosis not present

## 2020-05-07 DIAGNOSIS — M6281 Muscle weakness (generalized): Secondary | ICD-10-CM | POA: Diagnosis not present

## 2020-05-07 DIAGNOSIS — M545 Low back pain, unspecified: Secondary | ICD-10-CM | POA: Diagnosis not present

## 2020-05-10 ENCOUNTER — Ambulatory Visit (HOSPITAL_COMMUNITY)
Admission: EM | Admit: 2020-05-10 | Discharge: 2020-05-10 | Disposition: A | Discharge status: Home / Self Care | Payer: Medicare Other | Attending: Student | Admitting: Student

## 2020-05-10 ENCOUNTER — Encounter (HOSPITAL_COMMUNITY): Payer: Self-pay | Admitting: Emergency Medicine

## 2020-05-10 ENCOUNTER — Other Ambulatory Visit: Payer: Self-pay

## 2020-05-10 DIAGNOSIS — G4700 Insomnia, unspecified: Secondary | ICD-10-CM | POA: Diagnosis not present

## 2020-05-10 DIAGNOSIS — C61 Malignant neoplasm of prostate: Secondary | ICD-10-CM | POA: Diagnosis not present

## 2020-05-10 DIAGNOSIS — I10 Essential (primary) hypertension: Secondary | ICD-10-CM | POA: Diagnosis not present

## 2020-05-10 DIAGNOSIS — R198 Other specified symptoms and signs involving the digestive system and abdomen: Secondary | ICD-10-CM

## 2020-05-10 DIAGNOSIS — K219 Gastro-esophageal reflux disease without esophagitis: Secondary | ICD-10-CM | POA: Diagnosis not present

## 2020-05-10 DIAGNOSIS — E7800 Pure hypercholesterolemia, unspecified: Secondary | ICD-10-CM | POA: Diagnosis not present

## 2020-05-10 MED ORDER — SUCRALFATE 1 GM/10ML PO SUSP: 1.0000 g | Freq: Three times a day (TID) | ORAL | 0 refills | Status: DC | PRN

## 2020-05-10 NOTE — ED Triage Notes (Signed)
Pt states that he took a Calcium pill today and it was hard to swallow. Pt noticed that his throat became irritated. Pt states that he tried to drink and eat to push the pill down but it didn't help. Pt eventually coughed half the pill up. Pt states that he still has some irritation going with his throat

## 2020-05-10 NOTE — ED Provider Notes (Signed)
[name]    CSN: [id number] Arrival date & time: 05/10/20  1639      History   Chief Complaint Chief Complaint  Patient presents with  . Sore Throat    HPI [name] is a 75 y.o. male presenting with one episode of pill getting stuck in his throat earlier today.  History arthritis, prostate cancer, hyperlipidemia, hypertension, tremor, CSF leak.  States that he tried to take a large calcium pill earlier today and had trouble swallowing this.  He tried to push this down with fluids but this did not help.  He eventually coughed the pill up later on.  Endorses some irritation from this.  Has eaten entire sandwich and drank 18 ounces of water since the episode without any issue.  States that his throat does not even feel sore any more.  Not having any trouble swallowing.  Denies coughing up any blood.  Denies chest pain.  States has had occasional issues with acid in the past but never been on any medications for this.  Called his primary care who recommended he try a spoonful of honey.  HPI  Past Medical History:  Diagnosis Date  . Arthritis    hands   . Cancer [facility])    prostate cancer   . Hyperlipidemia   . Hypertension   . Prostate cancer ([place])   . Recurrent upper respiratory infection (URI)    pt had a cold recent- week ago - better now   . Tremor     Patient Active Problem List   Diagnosis Date Noted  . 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 Essential tremor 01/24/2016  . Prostate cancer ([place]) 03/10/2013    Past Surgical History:  Procedure Laterality Date  . CATARACT EXTRACTION Bilateral   . LUMBAR LAMINECTOMY/DECOMPRESSION MICRODISCECTOMY Left 04/01/2018   Procedure: Left Lumbar four-lumbar five Microdiscectomy;  Surgeon: Erline Levine, MD;  Location: [place];  Service: Neurosurgery;  Laterality: Left;  . OTHER SURGICAL HISTORY  2005   right hand middle finger surgery   . REPAIR OF CEREBROSPINAL FLUID LEAK N/A  05/18/2018   Procedure: Exploration of Lumbar four-five with repair of cerebrospinal fluid leak;  Surgeon: Erline Levine, MD;  Location: [place];  Service: Neurosurgery;  Laterality: N/A;  . ROBOT ASSISTED LAPAROSCOPIC RADICAL PROSTATECTOMY  01/13/2011   Procedure: ROBOTIC ASSISTED LAPAROSCOPIC RADICAL PROSTATECTOMY LEVEL 2;  Surgeon: Dutch Gray, MD;  [facility];  Service: Urology;  Laterality: Bilateral;  Robotic Assisted Laparoscopic Prostatetectomy with Bilateral Lymphadenectomy  Level 2       Home Medications    Prior to Admission medications   Medication Sig Start Date End Date Taking? Authorizing Provider  sucralfate (CARAFATE) 1 GM/10ML suspension Take 10 mLs (1 g total) by mouth 3 (three) times daily as needed for up to 15 doses. 05/10/20  Yes Hazel Sams, PA-C  ascorbic acid (VITAMIN C) 1000 MG tablet Take 1,000 mg by mouth daily. Pt takes 5 times a week    [provider]  Calcium Carbonate-Vitamin D (CALCIUM 600+D3 PO) Take 1 tablet by mouth daily. Takes 5 times a week    [provider]  LORazepam (ATIVAN) 0.5 MG tablet Take 0.5 mg by mouth at bedtime as needed for anxiety (restless legs).    [provider]  losartan-hydrochlorothiazide (HYZAAR) 50-12.5 MG tablet Take 1 tablet by mouth daily.    [provider]  Multiple Vitamin (MULTIVITAMIN WITH MINERALS) TABS tablet Take  1 tablet by mouth 2 (two) times a week.     [provider]  primidone (MYSOLINE) 50 MG tablet TAKE FOUR TABLETS BY MOUTH EVERY MORNING AND TAKE ONE TABLET BY MOUTH EVERY NIGHT AT BEDTIME 03/23/20   Tat, Eustace Quail, DO  rosuvastatin (CRESTOR) 10 MG tablet Take 1 tablet (10 mg total) by mouth daily. 05/12/19   Fay Records, MD  Vibegron (GEMTESA) 75 MG TABS Take 1 tablet by mouth daily.    [provider]  zolpidem (AMBIEN CR) 12.5 MG CR tablet Take 6.25 mg by mouth at bedtime.     [provider]    Family History Family History  Problem  Relation Age of Onset  . Heart attack Father   . Hypertension Father   . Cancer Mother        cervical with mets to lung  . Hypertension Sister   . Healthy Child     Social History Social History   Tobacco Use  . Smoking status: Never Smoker  . Smokeless tobacco: Never Used  Vaping Use  . Vaping Use: Never used  Substance Use Topics  . Alcohol use: Yes    Comment: 1 drink per day or less  . Drug use: No     Allergies   Tape   Review of Systems Review of Systems  Constitutional: Negative for appetite change, chills and fever.  HENT: Positive for trouble swallowing. Negative for congestion, ear pain, rhinorrhea, sinus pressure, sinus pain, sore throat and voice change.   Eyes: Negative for redness and visual disturbance.  Respiratory: Negative for cough, chest tightness, shortness of breath and wheezing.   Cardiovascular: Negative for chest pain and palpitations.  Gastrointestinal: Negative for abdominal pain, constipation, diarrhea, nausea and vomiting.  Genitourinary: Negative for dysuria, frequency and urgency.  Musculoskeletal: Negative for myalgias.  Neurological: Negative for dizziness, weakness and headaches.  Psychiatric/Behavioral: Negative for confusion.  All other systems reviewed and are negative.    Physical Exam Triage Vital Signs ED Triage Vitals  Enc Vitals Group     BP 05/10/20 1742 116/69     Pulse Rate 05/10/20 1742 65     Resp 05/10/20 1742 18     Temp 05/10/20 1742 98.7 F (37.1 C)     Temp Source 05/10/20 1742 Oral     SpO2 05/10/20 1742 97 %     Weight --      Height --      Head Circumference --      Peak Flow --      Pain Score 05/10/20 1745 1     Pain Loc --      Pain Edu? --      Excl. in [place]? --    No data found.  Updated Vital Signs BP 116/69 (BP Location: Left Arm)   Pulse 65   Temp 98.7 F (37.1 C) (Oral)   Resp 18   SpO2 97%   Visual Acuity Right Eye Distance:   Left Eye Distance:   Bilateral Distance:     Right Eye Near:   Left Eye Near:    Bilateral Near:     Physical Exam Vitals reviewed.  Constitutional:      Appearance: He is well-developed.  HENT:     Head: Normocephalic and atraumatic.     Mouth/Throat:     Mouth: Mucous membranes are moist.     Pharynx: Uvula midline. No pharyngeal swelling, oropharyngeal exudate, posterior oropharyngeal erythema or uvula swelling.  Tonsils: No tonsillar exudate. 0 on the right. 0 on the left.     Comments: Smooth erythema posterior pharynx Cardiovascular:     Rate and Rhythm: Normal rate and regular rhythm.  Pulmonary:     Effort: Pulmonary effort is normal.     Breath sounds: Normal breath sounds.  Abdominal:     Palpations: Abdomen is soft.     Tenderness: There is no abdominal tenderness. There is no guarding or rebound.  Neurological:     General: No focal deficit present.     Mental Status: He is alert and oriented to person, place, and time.  Psychiatric:        Mood and Affect: Mood normal.        Behavior: Behavior normal.      UC Treatments / Results  Labs (all labs ordered are listed, but only abnormal results are displayed) Labs Reviewed - No data to display  EKG   Radiology No results found.  Procedures Procedures (including critical care time)  Medications Ordered in UC Medications - No data to display  Initial Impression / Assessment and Plan / UC Course  I have reviewed the triage vital signs and the nursing notes.  Pertinent labs & imaging results that were available during my care of the patient were reviewed by me and considered in my medical decision making (see chart for details).      This patient is a 75 year old male presenting with 1 episode of trouble swallowing a pill.  Exam reassuring today.  Carafate syrup sent in for symptomatic relief.  Follow-up with PCP if symptoms worsen or persist.  ED return precautions discussed.  This chart was dictated using voice recognition software,  Dragon. Despite the best efforts of this provider to proofread and correct errors, errors may still occur which can change documentation meaning.   Final Clinical Impressions(s) / UC Diagnoses   Final diagnoses:  Difficulty swallowing pills     Discharge Instructions     -Use the carafate syrup up to 3x daily for throat irritation and difficulty swallowing pills -Follow-up with PCP in about 1 week to ensure symptoms have resolved -If you develop new symptoms that concern you like chest pain, shortness of breath, difficulty swallowing- head straight to ED.    ED Prescriptions    Medication Sig Dispense Auth. Provider   sucralfate (CARAFATE) 1 GM/10ML suspension Take 10 mLs (1 g total) by mouth 3 (three) times daily as needed for up to 15 doses. 150 mL Hazel Sams, PA-C     PDMP not reviewed this encounter.   Hazel Sams, PA-C 05/10/20 [phone]

## 2020-05-10 NOTE — Discharge Instructions (Signed)
-  Use the carafate syrup up to 3x daily for throat irritation and difficulty swallowing pills -Follow-up with PCP in about 1 week to ensure symptoms have resolved -If you develop new symptoms that concern you like chest pain, shortness of breath, difficulty swallowing- head straight to ED.

## 2020-05-14 DIAGNOSIS — M6281 Muscle weakness (generalized): Secondary | ICD-10-CM | POA: Diagnosis not present

## 2020-05-14 DIAGNOSIS — M2560 Stiffness of unspecified joint, not elsewhere classified: Secondary | ICD-10-CM | POA: Diagnosis not present

## 2020-05-14 DIAGNOSIS — M545 Low back pain, unspecified: Secondary | ICD-10-CM | POA: Diagnosis not present

## 2020-05-21 DIAGNOSIS — M2560 Stiffness of unspecified joint, not elsewhere classified: Secondary | ICD-10-CM | POA: Diagnosis not present

## 2020-05-21 DIAGNOSIS — M545 Low back pain, unspecified: Secondary | ICD-10-CM | POA: Diagnosis not present

## 2020-05-21 DIAGNOSIS — M6281 Muscle weakness (generalized): Secondary | ICD-10-CM | POA: Diagnosis not present

## 2020-05-24 DIAGNOSIS — Z9079 Acquired absence of other genital organ(s): Secondary | ICD-10-CM | POA: Diagnosis not present

## 2020-05-24 DIAGNOSIS — Z923 Personal history of irradiation: Secondary | ICD-10-CM | POA: Diagnosis not present

## 2020-05-24 DIAGNOSIS — Z8546 Personal history of malignant neoplasm of prostate: Secondary | ICD-10-CM | POA: Diagnosis not present

## 2020-05-24 DIAGNOSIS — E785 Hyperlipidemia, unspecified: Secondary | ICD-10-CM | POA: Diagnosis not present

## 2020-05-24 DIAGNOSIS — N3946 Mixed incontinence: Secondary | ICD-10-CM | POA: Diagnosis not present

## 2020-05-24 DIAGNOSIS — I10 Essential (primary) hypertension: Secondary | ICD-10-CM | POA: Diagnosis not present

## 2020-05-30 ENCOUNTER — Telehealth: Payer: Self-pay | Admitting: Neurology

## 2020-05-30 NOTE — Telephone Encounter (Signed)
Patient called in stating he is going to take Imipramine for his incontinence. He wanted to see if it would be ok to take that medication with his Primidone?

## 2020-05-31 NOTE — Telephone Encounter (Signed)
Patient notified and voiced understanding.

## 2020-05-31 NOTE — Telephone Encounter (Signed)
I have no objections to that.

## 2020-06-05 DIAGNOSIS — K219 Gastro-esophageal reflux disease without esophagitis: Secondary | ICD-10-CM | POA: Diagnosis not present

## 2020-06-05 DIAGNOSIS — I10 Essential (primary) hypertension: Secondary | ICD-10-CM | POA: Diagnosis not present

## 2020-06-05 DIAGNOSIS — E7800 Pure hypercholesterolemia, unspecified: Secondary | ICD-10-CM | POA: Diagnosis not present

## 2020-06-05 DIAGNOSIS — C61 Malignant neoplasm of prostate: Secondary | ICD-10-CM | POA: Diagnosis not present

## 2020-06-05 DIAGNOSIS — G4700 Insomnia, unspecified: Secondary | ICD-10-CM | POA: Diagnosis not present

## 2020-06-12 ENCOUNTER — Other Ambulatory Visit: Payer: Self-pay | Admitting: Orthopedic Surgery

## 2020-06-12 DIAGNOSIS — M25551 Pain in right hip: Secondary | ICD-10-CM | POA: Diagnosis not present

## 2020-06-14 ENCOUNTER — Other Ambulatory Visit: Payer: Medicare Other

## 2020-06-15 ENCOUNTER — Ambulatory Visit
Admission: RE | Admit: 2020-06-15 | Discharge: 2020-06-15 | Disposition: A | Discharge status: Home / Self Care | Payer: Medicare Other | Source: Ambulatory Visit | Attending: Orthopedic Surgery | Admitting: Orthopedic Surgery

## 2020-06-15 DIAGNOSIS — M25551 Pain in right hip: Secondary | ICD-10-CM | POA: Diagnosis not present

## 2020-06-18 DIAGNOSIS — M25551 Pain in right hip: Secondary | ICD-10-CM | POA: Diagnosis not present

## 2020-06-21 ENCOUNTER — Other Ambulatory Visit: Payer: Medicare Other

## 2020-06-25 ENCOUNTER — Other Ambulatory Visit: Payer: Self-pay | Admitting: Neurology

## 2020-06-27 DIAGNOSIS — D1801 Hemangioma of skin and subcutaneous tissue: Secondary | ICD-10-CM | POA: Diagnosis not present

## 2020-06-27 DIAGNOSIS — L570 Actinic keratosis: Secondary | ICD-10-CM | POA: Diagnosis not present

## 2020-06-27 DIAGNOSIS — Z85820 Personal history of malignant melanoma of skin: Secondary | ICD-10-CM | POA: Diagnosis not present

## 2020-06-27 DIAGNOSIS — Z85828 Personal history of other malignant neoplasm of skin: Secondary | ICD-10-CM | POA: Diagnosis not present

## 2020-06-27 DIAGNOSIS — L821 Other seborrheic keratosis: Secondary | ICD-10-CM | POA: Diagnosis not present

## 2020-07-01 ENCOUNTER — Other Ambulatory Visit: Payer: Medicare Other

## 2020-07-06 NOTE — Progress Notes (Signed)
Virtual Visit Via Video   The purpose of this virtual visit is to provide medical care while limiting exposure to the novel coronavirus.    Consent was obtained for video visit:  Yes.   Answered questions that patient had about telehealth interaction:  Yes.   I discussed the limitations, risks, security and privacy concerns of performing an evaluation and management service by telemedicine. I also discussed with the patient that there may be a patient responsible charge related to this service. The patient expressed understanding and agreed to proceed.  Pt location: Home Physician Location: office Name of referring provider:  Antony Contras, MD I connected with Ladonna Snide at patients initiation/request on 07/10/2020 at  2:30 PM EDT by video enabled telemedicine application and verified that I am speaking with the correct person using two identifiers. Pt MRN:  [id number] Pt DOB:  [birth year] Video Participants:  Ladonna Snide;    Assessment/Plan:    1.  Essential Tremor, left much more significant than right  -Patient is frustrated with his essential tremor.  We had a long discussion, and he really wants to try to increase the primidone.  He tolerates it well.  He will take primidone, 250 mg, 1 tablet in the morning and continue primidone, 50 mg at bedtime.  We discussed risk, benefits, and side effects.  He knows not to stop it cold [place].  -He is not interested in surgical interventions.   2.  Near syncope/syncope  -has a follow up with PCP in 2 days  -had taken ativan and ambien before bed but that wasn't unusual for him.  -was taking imipramine by PA.  Told him to hold for now until sees PCP as imipramine was new medication for him and certainly can have anticholinergic side effects  3.  Patient has a follow-up in June.  For now, he wants to keep that as he really would like to be seen in person. Subjective:   DAILY CRATE was seen today in follow up for essential tremor.  My  previous records were reviewed prior to todays visit. Pt originally scheduled for in person visit but had to change due to recent fall.  He fractured his ribs.  States that he got up at night to use the RR and he felt dizzy and he fell on the metal footboard of the bed.  He got up to get advil b/c his ribs were hurting and he got dizzy and near syncopal and fell again.  He doesn't think that he lost consciousness.  He crawled back to the bedroom again and got back in the bed and slept for a few hours.  Went to the [facility] and told him he had a small fx.  He was given mobic and tylenol.  He has an appt on Thursday to look at source of near syncope.  He did take ativan that night and took an extra 1/2 tablet but that wasn't unusual for him.  Has upcoming appt with pcp to discuss. He does state that imipramine was a fairly new drug for him, given to him by urology PA for incontinence.      In regards to tremor, L hand becoming non functional and R had just a tad better.  Current prescribed movement disorder medications: Primidone, 50 mg, 4 tablets in the morning, 1 at night   ALLERGIES:   Allergies  Allergen Reactions  . Tape Other (See Comments)    Surgical tape=redness/rash at site  CURRENT MEDICATIONS:  Outpatient Encounter Medications as of 07/10/2020  Medication Sig  . ascorbic acid (VITAMIN C) 1000 MG tablet Take 1,000 mg by mouth daily. Pt takes 5 times a week  . imipramine (TOFRANIL) 25 MG tablet Take 1 tablet by mouth at bedtime.  Marland Kitchen LORazepam (ATIVAN) 0.5 MG tablet Take 0.5 mg by mouth at bedtime as needed for anxiety (restless legs).  . losartan-hydrochlorothiazide (HYZAAR) 50-12.5 MG tablet Take 1 tablet by mouth daily.  . meloxicam (MOBIC) 15 MG tablet Take 1 tablet by mouth daily as needed.  . Multiple Vitamin (MULTIVITAMIN WITH MINERALS) TABS tablet Take 1 tablet by mouth 2 (two) times a week.   . primidone (MYSOLINE) 50 MG tablet TAKE FOUR TABLETS BY MOUTH EVERY  MORNING AND TAKE ONE TABLET BY MOUTH EVERY NIGHT AT BEDTIME  . rosuvastatin (CRESTOR) 10 MG tablet Take 1 tablet (10 mg total) by mouth daily.  . Vibegron (GEMTESA) 75 MG TABS Take 1 tablet by mouth daily.  Marland Kitchen zolpidem (AMBIEN CR) 12.5 MG CR tablet Take 6.25 mg by mouth at bedtime.  . [DISCONTINUED] Calcium Carbonate-Vitamin D (CALCIUM 600+D3 PO) Take 1 tablet by mouth daily. Takes 5 times a week (Patient not taking: Reported on 07/10/2020)  . [DISCONTINUED] sucralfate (CARAFATE) 1 GM/10ML suspension Take 10 mLs (1 g total) by mouth 3 (three) times daily as needed for up to 15 doses. (Patient not taking: Reported on 07/10/2020)   No facility-administered encounter medications on file as of 07/10/2020.     Objective:    PHYSICAL EXAMINATION:    VITALS:   Vitals:   07/10/20 1231  Weight: 176 lb (79.8 kg)  Height: 5\' 11"  (1.803 m)    GEN:  The patient appears stated age and is in NAD. HEENT:  Normocephalic, atraumatic.  Neurological examination:  Orientation: The patient is alert and oriented x3. Cranial nerves: There is good facial symmetry. The speech is fluent and clear. Soft palate rises symmetrically and there is no tongue deviation. Hearing is intact to conversational tone. Motor: Strength is at least antigravity x4.  Movement examination: Abnormal movements: There is moderate postural and intention tremor on the left and mild on the right. I have reviewed and interpreted the following labs independently   Chemistry      Component Value Date/Time   NA 139 03/06/2020 0917   NA 141 01/06/2017 0820   K 4.1 03/06/2020 0917   K 4.2 01/06/2017 0820   CL 104 03/06/2020 0917   CO2 27 03/06/2020 0917   CO2 26 01/06/2017 0820   BUN 22 03/06/2020 0917   BUN 28.9 (H) 01/06/2017 0820   CREATININE 0.90 03/06/2020 0917   CREATININE 1.1 01/06/2017 0820      Component Value Date/Time   CALCIUM 10.2 03/06/2020 0917   CALCIUM 9.7 01/06/2017 0820   ALKPHOS 54 03/06/2020 0917    ALKPHOS 41 01/06/2017 0820   AST 20 03/06/2020 0917   AST 19 01/06/2017 0820   ALT 14 03/06/2020 0917   ALT 16 01/06/2017 0820   BILITOT 0.6 03/06/2020 0917   BILITOT 0.43 01/06/2017 0820      Lab Results  Component Value Date   WBC 5.5 03/06/2020   HGB 13.4 03/06/2020   HCT 40.2 03/06/2020   MCV 88.5 03/06/2020   PLT 191 03/06/2020   No results found for: TSH     Total time spent on today's visit was 30 minutes, including both face-to-face time and nonface-to-face time.  Time included that spent on review  of records (prior notes available to me/labs/imaging if pertinent), discussing treatment and goals, answering patient's questions and coordinating care.  Cc:  Antony Contras, MD

## 2020-07-09 DIAGNOSIS — W010XXA Fall on same level from slipping, tripping and stumbling without subsequent striking against object, initial encounter: Secondary | ICD-10-CM | POA: Diagnosis not present

## 2020-07-09 DIAGNOSIS — S299XXA Unspecified injury of thorax, initial encounter: Secondary | ICD-10-CM | POA: Diagnosis not present

## 2020-07-09 DIAGNOSIS — S51011A Laceration without foreign body of right elbow, initial encounter: Secondary | ICD-10-CM | POA: Diagnosis not present

## 2020-07-10 ENCOUNTER — Other Ambulatory Visit: Payer: Self-pay

## 2020-07-10 ENCOUNTER — Encounter: Payer: Self-pay | Admitting: Neurology

## 2020-07-10 ENCOUNTER — Telehealth (INDEPENDENT_AMBULATORY_CARE_PROVIDER_SITE_OTHER): Payer: Medicare Other | Admitting: Neurology

## 2020-07-10 VITALS — BP 130/76 | Ht 71.0 in | Wt 176.0 lb

## 2020-07-10 DIAGNOSIS — G250 Essential tremor: Secondary | ICD-10-CM

## 2020-07-10 DIAGNOSIS — R55 Syncope and collapse: Secondary | ICD-10-CM

## 2020-07-10 MED ORDER — PRIMIDONE 250 MG PO TABS: 250.0000 mg | ORAL_TABLET | ORAL | 1 refills | Status: DC

## 2020-07-10 MED ORDER — PRIMIDONE 50 MG PO TABS: 50.0000 mg | ORAL_TABLET | Freq: Every day | ORAL | 1 refills | Status: DC

## 2020-07-12 ENCOUNTER — Telehealth: Payer: Self-pay | Admitting: Oncology

## 2020-07-12 ENCOUNTER — Telehealth: Payer: Self-pay | Admitting: *Deleted

## 2020-07-12 DIAGNOSIS — S51011D Laceration without foreign body of right elbow, subsequent encounter: Secondary | ICD-10-CM | POA: Diagnosis not present

## 2020-07-12 DIAGNOSIS — R0789 Other chest pain: Secondary | ICD-10-CM | POA: Diagnosis not present

## 2020-07-12 DIAGNOSIS — R32 Unspecified urinary incontinence: Secondary | ICD-10-CM | POA: Diagnosis not present

## 2020-07-12 DIAGNOSIS — R55 Syncope and collapse: Secondary | ICD-10-CM | POA: Diagnosis not present

## 2020-07-12 NOTE — Telephone Encounter (Signed)
PC to patient, informed him Dr. Alen Blew is ordering PSA level for him.  Patient is requesting lab appointment the first week of June, he will be on vacation next week.  Scheduling message sent.

## 2020-07-12 NOTE — Telephone Encounter (Signed)
Scheduled appt per 5/19 sch msg. Pt aware.  

## 2020-07-12 NOTE — Telephone Encounter (Signed)
-----   Message from Wyatt Portela, MD sent at 07/12/2020  3:27 PM EDT ----- Regarding: RE: PSA Please schedule labs only next week. Thanks ----- Message ----- From: Rolene Course, RN Sent: 07/12/2020   3:25 PM EDT To: Wyatt Portela, MD Subject: PSA                                            [name] called & is asking if he could have another PSA done.  His last one was in January & he doesn't have another lab appointment until next January.  Evidently he is worried about it.  Do you want him to have another one drawn sooner?  Please advise.

## 2020-07-19 DIAGNOSIS — L539 Erythematous condition, unspecified: Secondary | ICD-10-CM | POA: Diagnosis not present

## 2020-07-19 DIAGNOSIS — S2232XA Fracture of one rib, left side, initial encounter for closed fracture: Secondary | ICD-10-CM | POA: Diagnosis not present

## 2020-07-19 DIAGNOSIS — R0781 Pleurodynia: Secondary | ICD-10-CM | POA: Diagnosis not present

## 2020-07-24 DIAGNOSIS — R0781 Pleurodynia: Secondary | ICD-10-CM | POA: Insufficient documentation

## 2020-07-24 DIAGNOSIS — S2232XD Fracture of one rib, left side, subsequent encounter for fracture with routine healing: Secondary | ICD-10-CM | POA: Diagnosis not present

## 2020-07-24 DIAGNOSIS — L539 Erythematous condition, unspecified: Secondary | ICD-10-CM | POA: Diagnosis not present

## 2020-07-24 DIAGNOSIS — S2239XA Fracture of one rib, unspecified side, initial encounter for closed fracture: Secondary | ICD-10-CM | POA: Insufficient documentation

## 2020-07-27 ENCOUNTER — Telehealth: Payer: Self-pay | Admitting: Neurology

## 2020-07-27 ENCOUNTER — Other Ambulatory Visit: Payer: Self-pay

## 2020-07-27 MED ORDER — PRIMIDONE 250 MG PO TABS: 250.0000 mg | ORAL_TABLET | ORAL | 0 refills | Status: DC

## 2020-07-27 MED ORDER — PRIMIDONE 50 MG PO TABS: 50.0000 mg | ORAL_TABLET | Freq: Every day | ORAL | 0 refills | Status: DC

## 2020-07-27 NOTE — Telephone Encounter (Signed)
Refill called in for pt.

## 2020-07-27 NOTE — Telephone Encounter (Signed)
Patient called in for a refill for his primidone

## 2020-07-30 ENCOUNTER — Telehealth: Payer: Self-pay | Admitting: Neurology

## 2020-07-30 ENCOUNTER — Other Ambulatory Visit: Payer: Self-pay | Admitting: Neurology

## 2020-07-30 NOTE — Telephone Encounter (Signed)
Pt called in regarding his meds. He would like a call back. He fell and is concerned about upping his meds due to his pain meds. Would like info about that. He is out of 50mg  primidone.

## 2020-07-30 NOTE — Telephone Encounter (Signed)
Wants 90 tablets of 50mg , patient is going to call pharmacy.

## 2020-07-31 ENCOUNTER — Other Ambulatory Visit: Payer: Self-pay

## 2020-07-31 ENCOUNTER — Inpatient Hospital Stay: Payer: Medicare Other | Attending: Oncology

## 2020-07-31 ENCOUNTER — Other Ambulatory Visit: Payer: Self-pay | Admitting: Neurology

## 2020-07-31 DIAGNOSIS — C61 Malignant neoplasm of prostate: Secondary | ICD-10-CM | POA: Diagnosis not present

## 2020-07-31 LAB — CMP (CANCER CENTER ONLY)
ALT: 17 U/L (ref 0–44)
AST: 18 U/L (ref 15–41)
Albumin: 3.6 g/dL (ref 3.5–5.0)
Alkaline Phosphatase: 93 U/L (ref 38–126)
Anion gap: 8 (ref 5–15)
BUN: 20 mg/dL (ref 8–23)
CO2: 26 mmol/L (ref 22–32)
Calcium: 9.7 mg/dL (ref 8.9–10.3)
Chloride: 102 mmol/L (ref 98–111)
Creatinine: 0.83 mg/dL (ref 0.61–1.24)
GFR, Estimated: >60 mL/min (ref >60)
Glucose, Bld: 95 mg/dL (ref 70–99)
Potassium: 4.2 mmol/L (ref 3.5–5.1)
Sodium: 136 mmol/L (ref 135–145)
Total Bilirubin: 0.4 mg/dL (ref 0.3–1.2)
Total Protein: 7 g/dL (ref 6.5–8.1)

## 2020-07-31 LAB — CBC WITH DIFFERENTIAL (CANCER CENTER ONLY)
Abs Immature Granulocytes: 0.02 10*3/uL (ref 0.00–0.07)
Basophils Absolute: 0.1 10*3/uL (ref 0.0–0.1)
Basophils Relative: 1 %
Eosinophils Absolute: 0.1 10*3/uL (ref 0.0–0.5)
Eosinophils Relative: 2 %
HCT: 34.6 % — ABNORMAL LOW (ref 39.0–52.0)
Hemoglobin: 11.5 g/dL — ABNORMAL LOW (ref 13.0–17.0)
Immature Granulocytes: 0 %
Lymphocytes Relative: 15 %
Lymphs Abs: 1 10*3/uL (ref 0.7–4.0)
MCH: 29.6 pg (ref 26.0–34.0)
MCHC: 33.2 g/dL (ref 30.0–36.0)
MCV: 88.9 fL (ref 80.0–100.0)
Monocytes Absolute: 0.7 10*3/uL (ref 0.1–1.0)
Monocytes Relative: 11 %
Neutro Abs: 4.5 10*3/uL (ref 1.7–7.7)
Neutrophils Relative %: 71 %
Platelet Count: 209 10*3/uL (ref 150–400)
RBC: 3.89 MIL/uL — ABNORMAL LOW (ref 4.22–5.81)
RDW: 13.2 % (ref 11.5–15.5)
WBC Count: 6.3 10*3/uL (ref 4.0–10.5)
nRBC: 0 % (ref 0.0–0.2)

## 2020-08-01 LAB — PROSTATE-SPECIFIC AG, SERUM (LABCORP): Prostate Specific Ag, Serum: <0.1 ng/mL (ref 0.0–4.0)

## 2020-08-02 DIAGNOSIS — D696 Thrombocytopenia, unspecified: Secondary | ICD-10-CM | POA: Diagnosis not present

## 2020-08-02 DIAGNOSIS — F439 Reaction to severe stress, unspecified: Secondary | ICD-10-CM | POA: Diagnosis not present

## 2020-08-02 DIAGNOSIS — M25552 Pain in left hip: Secondary | ICD-10-CM | POA: Diagnosis not present

## 2020-08-02 DIAGNOSIS — D649 Anemia, unspecified: Secondary | ICD-10-CM | POA: Diagnosis not present

## 2020-08-02 DIAGNOSIS — R32 Unspecified urinary incontinence: Secondary | ICD-10-CM | POA: Diagnosis not present

## 2020-08-02 DIAGNOSIS — I10 Essential (primary) hypertension: Secondary | ICD-10-CM | POA: Diagnosis not present

## 2020-08-02 DIAGNOSIS — G4700 Insomnia, unspecified: Secondary | ICD-10-CM | POA: Diagnosis not present

## 2020-08-02 DIAGNOSIS — G250 Essential tremor: Secondary | ICD-10-CM | POA: Diagnosis not present

## 2020-08-02 DIAGNOSIS — C61 Malignant neoplasm of prostate: Secondary | ICD-10-CM | POA: Diagnosis not present

## 2020-08-02 DIAGNOSIS — E7800 Pure hypercholesterolemia, unspecified: Secondary | ICD-10-CM | POA: Diagnosis not present

## 2020-08-06 ENCOUNTER — Telehealth: Payer: Self-pay | Admitting: Neurology

## 2020-08-06 ENCOUNTER — Telehealth: Payer: Self-pay | Admitting: Internal Medicine

## 2020-08-06 NOTE — Telephone Encounter (Signed)
This was just refilled on June 3

## 2020-08-06 NOTE — Telephone Encounter (Signed)
PT states that he went and had his cholesterol checked at his pcp and has a concern in regards to the number wanted to speak w/ nurse to determine if this is a cause for concern

## 2020-08-06 NOTE — Telephone Encounter (Signed)
Pt called in stating he needs a refill of his 50 mg Primidone

## 2020-08-06 NOTE — Telephone Encounter (Signed)
Routed to me by mistake 

## 2020-08-07 ENCOUNTER — Telehealth: Payer: Self-pay | Admitting: Neurology

## 2020-08-07 NOTE — Telephone Encounter (Signed)
50 Mg Primidone was sent to :[facility], [place]  [address], [place] [address]  Phone:  [phone]  Fax:  [phone]

## 2020-08-07 NOTE — Telephone Encounter (Signed)
REview of labs   LDL is excellent   HDL is low  This is genetically controlled often   Also can be effected by other lipid particles   What are triglycerides?   If high, cut back on carbs Interesting his HDL last November was 70   Strange so different  Bottom line is there is no data to say any medicine to raise HDL is beneficial  This has not been shown WOuld recomm staing active, watch carbs   Keep on same meds  Would f/u lipids (order as lipomed panel ) next winter

## 2020-08-07 NOTE — Telephone Encounter (Signed)
Pt called

## 2020-08-07 NOTE — Telephone Encounter (Signed)
Patient called and expressed frustration he is having trouble getting his bedtime primidone 50MG  sent to [facility] [address].

## 2020-08-07 NOTE — Telephone Encounter (Signed)
Pt reported that he got results of lipid panel. LDL 58 Ratio 4.4 but he is worried that HDL is 23.  Discussed genetics and regular exercise factor in to HDL but otherwise difficult to raise HDL.  He walks at least 4 days per week, brisk about 1 mile and take his statin.  Adv him that he is doing great and that I will let Dr. Harrington Challenger know of his concern.

## 2020-08-09 NOTE — Telephone Encounter (Signed)
Sent comments through [facility].

## 2020-08-14 DIAGNOSIS — Z23 Encounter for immunization: Secondary | ICD-10-CM | POA: Diagnosis not present

## 2020-08-23 DIAGNOSIS — D649 Anemia, unspecified: Secondary | ICD-10-CM | POA: Diagnosis not present

## 2020-09-01 DIAGNOSIS — Z20822 Contact with and (suspected) exposure to covid-19: Secondary | ICD-10-CM | POA: Diagnosis not present

## 2020-09-02 ENCOUNTER — Encounter (HOSPITAL_COMMUNITY): Payer: Self-pay

## 2020-09-02 ENCOUNTER — Other Ambulatory Visit: Payer: Self-pay

## 2020-09-02 ENCOUNTER — Ambulatory Visit (HOSPITAL_COMMUNITY)
Admission: EM | Admit: 2020-09-02 | Discharge: 2020-09-02 | Disposition: A | Discharge status: Home / Self Care | Payer: Medicare Other | Attending: Emergency Medicine | Admitting: Emergency Medicine

## 2020-09-02 DIAGNOSIS — W57XXXA Bitten or stung by nonvenomous insect and other nonvenomous arthropods, initial encounter: Secondary | ICD-10-CM | POA: Diagnosis not present

## 2020-09-02 DIAGNOSIS — S80861A Insect bite (nonvenomous), right lower leg, initial encounter: Secondary | ICD-10-CM | POA: Diagnosis not present

## 2020-09-02 DIAGNOSIS — L03115 Cellulitis of right lower limb: Secondary | ICD-10-CM | POA: Diagnosis not present

## 2020-09-02 MED ORDER — CEPHALEXIN 500 MG PO CAPS: 1000.0000 mg | ORAL_CAPSULE | Freq: Two times a day (BID) | ORAL | 0 refills | Status: DC

## 2020-09-02 NOTE — ED Provider Notes (Signed)
[facility]    CSN: [id number] Arrival date & time: 09/02/20  1112      History   Chief Complaint Chief Complaint  Patient presents with   Tick Bite    HPI [name] is a 75 y.o. male.   Patient presents with redness and swelling to the back of the right upper leg after tick bite 2 days ago.  Believes that all aspects of the tick were removed.  Denies any fevers, chills, drainage at site, fatigue, lethargy, numbness or tingling, pain.  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   Essential tremor 01/24/2016   Prostate cancer ([place]) 03/10/2013    Past Surgical History:  Procedure Laterality Date   CATARACT EXTRACTION Bilateral    LUMBAR LAMINECTOMY/DECOMPRESSION MICRODISCECTOMY Left 04/01/2018   Procedure: Left Lumbar four-lumbar five Microdiscectomy;  Surgeon: Erline Levine, MD;  Location: [facility];  Service: Neurosurgery;  Laterality: Left;   OTHER SURGICAL HISTORY  2005   right hand middle finger surgery    REPAIR OF CEREBROSPINAL FLUID LEAK N/A 05/18/2018   Procedure: Exploration of Lumbar four-five with repair of cerebrospinal fluid leak;  Surgeon: Erline Levine, MD;  Location: [facility];  Service: Neurosurgery;  Laterality: N/A;   ROBOT ASSISTED LAPAROSCOPIC RADICAL PROSTATECTOMY  01/13/2011   Procedure: ROBOTIC ASSISTED LAPAROSCOPIC RADICAL PROSTATECTOMY LEVEL 2;  Surgeon: Dutch Gray, MD;  Location: [facility];  Service: Urology;  Laterality: Bilateral;  Robotic Assisted Laparoscopic Prostatetectomy with Bilateral Lymphadenectomy  Level 2       Home Medications    Prior to Admission medications   Medication Sig  Start Date End Date Taking? Authorizing Provider  cephALEXin (KEFLEX) 500 MG capsule Take 2 capsules (1,000 mg total) by mouth 2 (two) times daily. 09/02/20  Yes Josearmando Kuhnert R, NP  ascorbic acid (VITAMIN C) 1000 MG tablet Take 1,000 mg by mouth daily. Pt takes 5 times a week    [provider]  imipramine (TOFRANIL) 25 MG tablet Take 1 tablet by mouth at bedtime. 06/10/20 06/10/21  [provider]  LORazepam (ATIVAN) 0.5 MG tablet Take 0.5 mg by mouth at bedtime as needed for anxiety (restless legs).    [provider]  losartan-hydrochlorothiazide (HYZAAR) 50-12.5 MG tablet Take 1 tablet by mouth daily.    [provider]  meloxicam (MOBIC) 15 MG tablet Take 1 tablet by mouth daily as needed. 06/07/19   [provider]  Multiple Vitamin (MULTIVITAMIN WITH MINERALS) TABS tablet Take 1 tablet by mouth 2 (two) times a week.     [provider]  primidone (MYSOLINE) 250 MG tablet Take 1 tablet (250 mg total) by mouth every morning. 07/27/20   Tat, Eustace Quail, DO  primidone (MYSOLINE) 50 MG tablet Take 1 tablet (50 mg total) by mouth at bedtime. 07/27/20   Tat, Eustace Quail, DO  rosuvastatin (CRESTOR) 10 MG tablet Take 1 tablet (10 mg total) by mouth daily. 05/12/19   Fay Records, MD  Vibegron (GEMTESA) 75 MG TABS Take 1 tablet by mouth  daily.    [provider]  zolpidem (AMBIEN CR) 12.5 MG CR tablet Take 6.25 mg by mouth at bedtime.    [provider]    Family History Family History  Problem Relation Age of Onset   Heart attack Father    Hypertension Father    Cancer Mother        cervical with mets to lung   Hypertension Sister    Healthy Child     Social History Social History   Tobacco Use   Smoking status: Never   Smokeless tobacco: Never  Vaping Use   Vaping Use: Never used  Substance Use Topics   Alcohol use: Yes    Comment: 1 drink per day or less   Drug use: No     Allergies   Tape   Review of  Systems Review of Systems  Constitutional: Negative.   Respiratory: Negative.    Cardiovascular: Negative.   Genitourinary: Negative.   Skin:  Positive for wound. Negative for color change, pallor and rash.  Neurological: Negative.     Physical Exam Triage Vital Signs ED Triage Vitals  Enc Vitals Group     BP 09/02/20 1155 (!) 144/73     Pulse Rate 09/02/20 1155 82     Resp 09/02/20 1155 18     Temp 09/02/20 1155 98.8 F (37.1 C)     Temp Source 09/02/20 1155 Oral     SpO2 09/02/20 1155 96 %     Weight --      Height --      Head Circumference --      Peak Flow --      Pain Score 09/02/20 1156 6     Pain Loc --      Pain Edu? --      Excl. in [place]? --    No data found.  Updated Vital Signs BP (!) 144/73 (BP Location: Left Arm)   Pulse 82   Temp 98.8 F (37.1 C) (Oral)   Resp 18   SpO2 96%   Visual Acuity Right Eye Distance:   Left Eye Distance:   Bilateral Distance:    Right Eye Near:   Left Eye Near:    Bilateral Near:     Physical Exam Constitutional:      Appearance: Normal appearance. He is normal weight.  HENT:     Head: Normocephalic.  Eyes:     Extraocular Movements: Extraocular movements intact.  Pulmonary:     Effort: Pulmonary effort is normal.  Skin:      Neurological:     Mental Status: He is alert and oriented to person, place, and time. Mental status is at baseline.  Psychiatric:        Mood and Affect: Mood normal.        Behavior: Behavior normal.     UC Treatments / Results  Labs (all labs ordered are listed, but only abnormal results are displayed) Labs Reviewed - No data to display  EKG   Radiology No results found.  Procedures Procedures (including critical care time)  Medications Ordered in UC Medications - No data to display  Initial Impression / Assessment and Plan / UC Course  I have reviewed the triage vital signs and the nursing notes.  Pertinent labs & imaging results that were available during my care  of the patient were reviewed by me and considered in my medical decision making (see chart for details).  Tick bite Nonpurulent cellulitis  Site of tick bite appears to be noninfected however due to erythema and swelling lateral to the site will cover for any form of infection and treat for possible cellulitis, patient given written instructions on symptoms related to tick bite and when to follow-up  1.  Keflex 1000 mg twice daily for 5 days Final Clinical Impressions(s) / UC Diagnoses   Final diagnoses:  Tick bite of right lower leg, initial encounter  Cellulitis of right lower leg     Discharge Instructions      Take antibiotic twice a day for the next 5 days this is to cover any possible skin infection related to the open wound in the skin and not necessarily caused by taking, you do not need to provide wound care to site because antibiotics will cover for any form of infection, if area begins to swell, becomes tender, increases in size beginning to have fevers or chills please follow-up at the urgent care for reevaluation of infection  Please review in packet information on tick bite, has symptoms to watch for over the next 4 to 6 weeks, if these occur you may return to urgent care or follow-up with primary care for evaluation and possible blood work     ED Prescriptions     Medication Sig Dispense Auth. Provider   cephALEXin (KEFLEX) 500 MG capsule Take 2 capsules (1,000 mg total) by mouth 2 (two) times daily. 20 capsule Hans Eden, NP      PDMP not reviewed this encounter.   Hans Eden, [place] 09/02/20 [phone]

## 2020-09-02 NOTE — ED Triage Notes (Signed)
Pt presents with tick bite to right buttocks area X 2 days ago.

## 2020-09-02 NOTE — Discharge Instructions (Addendum)
Take antibiotic twice a day for the next 5 days this is to cover any possible skin infection related to the open wound in the skin and not necessarily caused by taking, you do not need to provide wound care to site because antibiotics will cover for any form of infection, if area begins to swell, becomes tender, increases in size beginning to have fevers or chills please follow-up at the urgent care for reevaluation of infection  Please review in packet information on tick bite, has symptoms to watch for over the next 4 to 6 weeks, if these occur you may return to urgent care or follow-up with primary care for evaluation and possible blood work

## 2020-09-03 DIAGNOSIS — L0390 Cellulitis, unspecified: Secondary | ICD-10-CM | POA: Diagnosis not present

## 2020-09-10 DIAGNOSIS — D649 Anemia, unspecified: Secondary | ICD-10-CM | POA: Diagnosis not present

## 2020-10-04 DIAGNOSIS — I10 Essential (primary) hypertension: Secondary | ICD-10-CM | POA: Diagnosis not present

## 2020-10-04 DIAGNOSIS — R058 Other specified cough: Secondary | ICD-10-CM | POA: Diagnosis not present

## 2020-10-10 DIAGNOSIS — U071 COVID-19: Secondary | ICD-10-CM | POA: Diagnosis not present

## 2020-10-18 DIAGNOSIS — R059 Cough, unspecified: Secondary | ICD-10-CM | POA: Diagnosis not present

## 2020-10-18 DIAGNOSIS — R1319 Other dysphagia: Secondary | ICD-10-CM | POA: Diagnosis not present

## 2020-10-18 DIAGNOSIS — I10 Essential (primary) hypertension: Secondary | ICD-10-CM | POA: Diagnosis not present

## 2020-10-25 ENCOUNTER — Other Ambulatory Visit: Payer: Self-pay

## 2020-10-25 ENCOUNTER — Telehealth: Payer: Self-pay | Admitting: Neurology

## 2020-10-25 DIAGNOSIS — G250 Essential tremor: Secondary | ICD-10-CM

## 2020-10-25 MED ORDER — PRIMIDONE 50 MG PO TABS: 50.0000 mg | ORAL_TABLET | Freq: Every day | ORAL | 0 refills | Status: DC

## 2020-10-25 NOTE — Telephone Encounter (Signed)
Sent correct medication the primidone 50 mg to [facility] for the patient. Called and let him know. He had no further questions at this time

## 2020-10-25 NOTE — Telephone Encounter (Signed)
Pt needs a call back regarding an RX. Said he hasnt picked up the RX cause its '250mg'$  , and he needs '50mg'$ .  It needs to be sent to [facility] on [address]

## 2020-10-28 ENCOUNTER — Other Ambulatory Visit: Payer: Self-pay | Admitting: Neurology

## 2020-10-28 DIAGNOSIS — G250 Essential tremor: Secondary | ICD-10-CM

## 2020-11-04 ENCOUNTER — Other Ambulatory Visit: Payer: Self-pay

## 2020-11-04 ENCOUNTER — Encounter (HOSPITAL_BASED_OUTPATIENT_CLINIC_OR_DEPARTMENT_OTHER): Payer: Self-pay | Admitting: *Deleted

## 2020-11-04 ENCOUNTER — Emergency Department (HOSPITAL_BASED_OUTPATIENT_CLINIC_OR_DEPARTMENT_OTHER)
Admission: EM | Admit: 2020-11-04 | Discharge: 2020-11-04 | Disposition: A | Discharge status: Home / Self Care | Payer: Medicare Other | Attending: Emergency Medicine | Admitting: Emergency Medicine

## 2020-11-04 DIAGNOSIS — I10 Essential (primary) hypertension: Secondary | ICD-10-CM | POA: Diagnosis not present

## 2020-11-04 DIAGNOSIS — Z8546 Personal history of malignant neoplasm of prostate: Secondary | ICD-10-CM | POA: Insufficient documentation

## 2020-11-04 DIAGNOSIS — Z79899 Other long term (current) drug therapy: Secondary | ICD-10-CM | POA: Diagnosis not present

## 2020-11-04 NOTE — ED Provider Notes (Signed)
[facility] EMERGENCY DEPT Provider Note   CSN: [id number] Arrival date & time: 11/04/20  [id number]     History Chief Complaint  Patient presents with  . Hypertension    [name] is a 75 y.o. male.   Hypertension Pertinent negatives include no chest pain, no abdominal pain, no headaches and no shortness of breath. Patient presents for hypertension.  He has history of hypertension that was previously treated with a losartan HCT combo (50-12.5).  He was taken off of this medication due to a chronic cough.  For several weeks, he did not take any blood pressure medications.  He was restarted on a losartan at a lower dose, 25 mg approximately a week and a half ago.  He has been checking his blood pressure at home per his doctors request.  Patient typically checks his blood pressure 8 times per day.  Patient has noticed that he has had increased blood pressure readings at home in the evenings.  Yesterday evening, it got as high as 200s over 100s.  Patient denies any symptoms at that time.  Patient takes his losartan 1 time per day, in the mornings.  He did take his dose this morning.  He denies any symptoms currently.      Past Medical History:  Diagnosis Date  . Arthritis    hands   . Cancer [employer] [facility] [employer])    prostate cancer   . Hyperlipidemia   . Hypertension   . Prostate cancer ([place])   . Recurrent upper respiratory infection (URI)    pt had a cold recent- week ago - better now   . Tremor     Patient Active Problem List   Diagnosis Date Noted  . 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 Essential tremor 01/24/2016  . Prostate cancer ([place]) 03/10/2013    Past Surgical History:  Procedure Laterality Date  . CATARACT EXTRACTION Bilateral   . LUMBAR LAMINECTOMY/DECOMPRESSION MICRODISCECTOMY Left 04/01/2018   Procedure: Left Lumbar four-lumbar five Microdiscectomy;  Surgeon: Erline Levine, MD;  Location: [facility];  Service: Neurosurgery;   Laterality: Left;  . OTHER SURGICAL HISTORY  2005   right hand middle finger surgery   . REPAIR OF CEREBROSPINAL FLUID LEAK N/A 05/18/2018   Procedure: Exploration of Lumbar four-five with repair of cerebrospinal fluid leak;  Surgeon: Erline Levine, MD;  Location: [facility];  Service: Neurosurgery;  Laterality: N/A;  . ROBOT ASSISTED LAPAROSCOPIC RADICAL PROSTATECTOMY  01/13/2011   Procedure: ROBOTIC ASSISTED LAPAROSCOPIC RADICAL PROSTATECTOMY LEVEL 2;  Surgeon: Dutch Gray, MD;  Location: [facility];  Service: Urology;  Laterality: Bilateral;  Robotic Assisted Laparoscopic Prostatetectomy with Bilateral Lymphadenectomy  Level 2       Family History  Problem Relation Age of Onset  . Heart attack Father   . Hypertension Father   . Cancer Mother        cervical with mets to lung  . Hypertension Sister   . Healthy Child     Social History   Tobacco Use  . Smoking status: Never  . Smokeless tobacco: Never  Vaping Use  . Vaping Use: Never used  Substance Use Topics  . Alcohol use: Yes    Comment: 1 drink per day or less  . Drug use: No    Home Medications Prior to Admission medications   Medication Sig Start Date End Date Taking? Authorizing Provider  ascorbic acid (VITAMIN C) 1000 MG tablet Take 1,000 mg by mouth daily. Pt takes  5 times a week   Yes [provider]  LORazepam (ATIVAN) 0.5 MG tablet Take 0.5 mg by mouth at bedtime as needed for anxiety (restless legs).   Yes [provider]  losartan (COZAAR) 25 MG tablet Take 25 mg by mouth daily.   Yes [provider]  zolpidem (AMBIEN CR) 12.5 MG CR tablet Take 6.25 mg by mouth at bedtime.   Yes [provider]  cephALEXin (KEFLEX) 500 MG capsule Take 2 capsules (1,000 mg total) by mouth 2 (two) times daily. 09/02/20   White, Leitha Schuller, NP  imipramine (TOFRANIL) 25 MG tablet Take 1 tablet by mouth at bedtime. 06/10/20 06/10/21  [provider]  losartan-hydrochlorothiazide (HYZAAR) 50-12.5 MG  tablet Take 1 tablet by mouth daily.    [provider]  meloxicam (MOBIC) 15 MG tablet Take 1 tablet by mouth daily as needed. 06/07/19   [provider]  Multiple Vitamin (MULTIVITAMIN WITH MINERALS) TABS tablet Take 1 tablet by mouth 2 (two) times a week.     [provider]  primidone (MYSOLINE) 250 MG tablet Take 1 tablet (250 mg total) by mouth every morning. 07/27/20   Tat, Eustace Quail, DO  primidone (MYSOLINE) 50 MG tablet Take 1 tablet (50 mg total) by mouth at bedtime. 10/25/20   Tat, Eustace Quail, DO  rosuvastatin (CRESTOR) 10 MG tablet Take 1 tablet (10 mg total) by mouth daily. 05/12/19   Fay Records, MD  Vibegron (GEMTESA) 75 MG TABS Take 1 tablet by mouth daily.    [provider]    Allergies    Tape  Review of Systems   Review of Systems  Constitutional:  Negative for activity change, appetite change, chills, fatigue and fever.  HENT:  Negative for ear pain and sore throat.   Eyes:  Negative for pain and visual disturbance.  Respiratory:  Negative for cough, chest tightness, shortness of breath and wheezing.   Cardiovascular:  Negative for chest pain, palpitations and leg swelling.  Gastrointestinal:  Negative for abdominal pain, nausea and vomiting.  Genitourinary:  Negative for dysuria, flank pain and hematuria.  Musculoskeletal:  Negative for arthralgias, back pain and neck pain.  Skin:  Negative for color change and rash.  Neurological:  Negative for dizziness, seizures, syncope, weakness, light-headedness, numbness and headaches.  All other systems reviewed and are negative.  Physical Exam Updated Vital Signs BP (!) 157/85 (BP Location: Right Arm)   Pulse 66   Temp 98.2 F (36.8 C) (Oral)   Resp 14   Ht '5\' 11"'$  (1.803 m)   Wt 79.8 kg   SpO2 98%   BMI 24.55 kg/m   Physical Exam Vitals and nursing note reviewed.  Constitutional:      General: He is not in acute distress.    Appearance: Normal appearance. He is well-developed and  normal weight. He is not ill-appearing, toxic-appearing or diaphoretic.  HENT:     Head: Normocephalic and atraumatic.     Right Ear: External ear normal.     Left Ear: External ear normal.     Nose: Nose normal.  Eyes:     Conjunctiva/sclera: Conjunctivae normal.  Cardiovascular:     Rate and Rhythm: Normal rate and regular rhythm.     Heart sounds: No murmur heard. Pulmonary:     Effort: Pulmonary effort is normal. No respiratory distress.     Breath sounds: Normal breath sounds. No wheezing or rales.  Chest:     Chest wall: No tenderness.  Abdominal:     Palpations: Abdomen is soft.     Tenderness: There is no abdominal tenderness. There is no right CVA tenderness or left CVA tenderness.  Musculoskeletal:     Cervical back: Neck supple.     Right lower leg: No edema.     Left lower leg: No edema.  Skin:    General: Skin is warm and dry.     Capillary Refill: Capillary refill takes less than 2 seconds.  Neurological:     General: No focal deficit present.     Mental Status: He is alert and oriented to person, place, and time.     Cranial Nerves: No cranial nerve deficit.     Sensory: No sensory deficit.     Motor: No weakness.     Coordination: Coordination normal.  Psychiatric:        Mood and Affect: Mood normal.        Behavior: Behavior normal.        Thought Content: Thought content normal.        Judgment: Judgment normal.    ED Results / Procedures / Treatments   Labs (all labs ordered are listed, but only abnormal results are displayed) Labs Reviewed - No data to display  EKG None  Radiology No results found.  Procedures Procedures   Medications Ordered in ED Medications - No data to display  ED Course  I have reviewed the triage vital signs and the nursing notes.  Pertinent labs & imaging results that were available during my care of the patient were reviewed by me and considered in my medical decision making (see chart for details).    MDM  Rules/Calculators/A&P                           Patient presents due to concerns of high blood pressure.  He takes a single antihypertensive (losartan, 25 mg, every morning).  He did take his dose this morning.  Blood pressure on arrival is in the range of 160/75.  This was checked in both arms and found to be the same.  Patient has not had any chest pain, shortness of breath, abdominal discomfort, nausea, dizziness, headache, vision changes, numbness, weakness, or extremity swelling.  He does have an appointment with his primary care doctor tomorrow to discuss blood pressure medications.  Given his absence of symptoms, in addition to his normal blood pressures at this time, I do not feel that extensive work-up is indicated.  I did offer the patient a work-up to assess for endorgan damage.  Patient declined and stated that he felt comfortable following ED discussion and following up with his primary care doctor tomorrow.  He was encouraged to return to the ED if he does experience any of the above-mentioned symptoms.  He was discharged in good condition.  Final Clinical Impression(s) / ED Diagnoses Final diagnoses:  None    Rx / DC Orders ED Discharge Orders     None        Godfrey Pick, MD 11/04/20 1039

## 2020-11-04 NOTE — ED Triage Notes (Signed)
Pt states he has history of HTN, yesterday noted to be 200/100 at highest point, over last few weeks dosage and med changes by PCP.

## 2020-11-04 NOTE — Discharge Instructions (Addendum)
Discussed blood pressure medications with your doctor tomorrow.  Please return to the emergency department if you develop any symptoms.

## 2020-11-19 ENCOUNTER — Other Ambulatory Visit: Payer: Self-pay

## 2020-11-19 ENCOUNTER — Telehealth: Payer: Self-pay | Admitting: Neurology

## 2020-11-19 NOTE — Telephone Encounter (Signed)
Called patient and he requested I send In the prescription to [facility] [phone]. Prescription called in for 4AM 1 PM 250 tablets

## 2020-11-19 NOTE — Telephone Encounter (Signed)
Pt called in about his refill of primidone 50mg . Michela Pitcher he is taking 4 in the AM and 1 QHS. He said he is running low and wont have enough.he is going out of town and he leaves today. He would like at least 180 pills to get him to his up coming appt with Tat. He will like a call back if possible

## 2020-12-12 DIAGNOSIS — Z23 Encounter for immunization: Secondary | ICD-10-CM | POA: Diagnosis not present

## 2020-12-14 DIAGNOSIS — Z23 Encounter for immunization: Secondary | ICD-10-CM | POA: Diagnosis not present

## 2020-12-24 ENCOUNTER — Ambulatory Visit (INDEPENDENT_AMBULATORY_CARE_PROVIDER_SITE_OTHER): Payer: Medicare Other | Admitting: Internal Medicine

## 2020-12-24 ENCOUNTER — Other Ambulatory Visit: Payer: Self-pay

## 2020-12-24 ENCOUNTER — Ambulatory Visit: Payer: Medicare Other | Admitting: Internal Medicine

## 2020-12-24 VITALS — BP 140/80 | HR 60 | Ht 71.0 in | Wt 175.4 lb

## 2020-12-24 DIAGNOSIS — E782 Mixed hyperlipidemia: Secondary | ICD-10-CM | POA: Diagnosis not present

## 2020-12-24 DIAGNOSIS — I2510 Atherosclerotic heart disease of native coronary artery without angina pectoris: Secondary | ICD-10-CM | POA: Diagnosis not present

## 2020-12-24 DIAGNOSIS — I10 Essential (primary) hypertension: Secondary | ICD-10-CM

## 2020-12-24 MED ORDER — AMLODIPINE BESYLATE 2.5 MG PO TABS
2.5000 mg | ORAL_TABLET | Freq: Every day | ORAL | 3 refills | Status: DC
Start: 2020-12-24 — End: 2022-04-23

## 2020-12-24 NOTE — Progress Notes (Signed)
Cardiology Office Note   Date:  12/24/2020   ID:  [name] 28-Jun-1945, MRN [id number]  PCP:  Antony Contras, MD  Cardiologist:   Dorris Carnes, MD   F/U of HTN     History of Present Illness: [name] is a 75 y.o. male with a history of chest pain, hyperlipidemia    CT scan in 2018 with mild arterial calcifications .   CT scan in March 2021 showe atherosclerosis of aorta, and coronary arteries I saw th pt in Nov 2021 as a televisit  The pt was seen at [facility] for urinary incontinence   Placed on med.  Got dizziness   Passed out   Broke rigbs   Taken off of med   Doing better   No recurrence    The pt was seen in ED in Sept 2022 for hypertension  He had been on losartan   Then stopped due to possible cough  Then low dose    Seen in ED  Sent home  Told to follow up in cl[facility]i    The pt says at home his bp is not optimal   140s to 170s   The pt says his breathing is OK   He denies CP    No dizziness            Current Meds  Medication Sig   ascorbic acid (VITAMIN C) 1000 MG tablet Take 1,000 mg by mouth daily. Pt takes 5 times a week   imipramine (TOFRANIL) 25 MG tablet Take 1 tablet by mouth at bedtime.   LORazepam (ATIVAN) 0.5 MG tablet Take 0.5 mg by mouth at bedtime as needed for anxiety (restless legs).   losartan (COZAAR) 25 MG tablet Take 25 mg by mouth daily.   meloxicam (MOBIC) 15 MG tablet Take 1 tablet by mouth daily as needed.   Multiple Vitamin (MULTIVITAMIN WITH MINERALS) TABS tablet Take 1 tablet by mouth 2 (two) times a week.    primidone (MYSOLINE) 250 MG tablet Take 1 tablet (250 mg total) by mouth every morning.   primidone (MYSOLINE) 50 MG tablet Take 1 tablet (50 mg total) by mouth at bedtime.   rosuvastatin (CRESTOR) 10 MG tablet Take 1 tablet (10 mg total) by mouth daily.   Vibegron (GEMTESA) 75 MG TABS Take 1 tablet by mouth daily.   zolpidem (AMBIEN CR) 12.5 MG CR tablet Take 6.25 mg by mouth at bedtime.     Allergies:   Tape   Past Medical  History:  Diagnosis Date   Arthritis    hands    Cancer ([place])    prostate cancer    Hyperlipidemia    Hypertension    Prostate cancer ([place])    Recurrent upper respiratory infection (URI)    pt had a cold recent- week ago - better now    Tremor     Past Surgical History:  Procedure Laterality Date   CATARACT EXTRACTION Bilateral    LUMBAR LAMINECTOMY/DECOMPRESSION MICRODISCECTOMY Left 04/01/2018   Procedure: Left Lumbar four-lumbar five Microdiscectomy;  Surgeon: Erline Levine, MD;  Location: [facility];  Service: Neurosurgery;  Laterality: Left;   OTHER SURGICAL HISTORY  2005   right hand middle finger surgery    REPAIR OF CEREBROSPINAL FLUID LEAK N/A 05/18/2018   Procedure: Exploration of Lumbar four-five with repair of cerebrospinal fluid leak;  Surgeon: Erline Levine, MD;  Location: [facility];  Service: Neurosurgery;  Laterality: N/A;   ROBOT ASSISTED LAPAROSCOPIC RADICAL PROSTATECTOMY  01/13/2011   Procedure: ROBOTIC ASSISTED LAPAROSCOPIC RADICAL PROSTATECTOMY LEVEL 2;  Surgeon: Dutch Gray, MD;  [facility];  Service: Urology;  Laterality: Bilateral;  Robotic Assisted Laparoscopic Prostatetectomy with Bilateral Lymphadenectomy  Level 2     Social History:  The patient  reports that he has never smoked. He has never used smokeless tobacco. He reports current alcohol use. He reports that he does not use drugs.   Family History:  The patient's family history includes Cancer in his mother; Healthy in his child; Heart attack in his father; Hypertension in his father and sister.    ROS:  Please see the history of present illness. All other systems are reviewed and  Negative to the above problem except as noted.    PHYSICAL EXAM: VS:  BP 140/80   Pulse 60   Ht 5\' 11"  (1.803 m)   Wt 175 lb 6.4 oz (79.6 kg)   SpO2 97%   BMI 24.46 kg/m   GEN Pt is in no acute distress  HEENT: normal  Neck: no JVD, carotid bruits, Cardiac: RRR; no murmurs,   No  LE dema  Respiratory:  clear to  auscultation bilaterally, GI: soft, nontender, nondistended, + BS  No hepatomegaly  MS: no deformity Moving all extremities   Skin: warm and dry, no rash Neuro:  Strength and sensation are intact Psych: euthymic mood, full affect   EKG:  EKG is not ordered today.  Lipid Panel    Component Value Date/Time   CHOL 121 07/13/2019 1020   TRIG 110 07/13/2019 1020   HDL 41 07/13/2019 1020   CHOLHDL 3.0 07/13/2019 1020   CHOLHDL 2.9 05/25/2015 0857   VLDL 15 05/25/2015 0857   LDLCALC 60 07/13/2019 1020      Wt Readings from Last 3 Encounters:  12/24/20 175 lb 6.4 oz (79.6 kg)  11/04/20 176 lb (79.8 kg)  07/10/20 176 lb (79.8 kg)      ASSESSMENT AND PLAN:  1  HTN  BP is still not optimal  Home readings 140s to 170s   REcomm continue losartan for now   Add amlodipine 2.5 daily then BID   Follow BP Follow up in HTN clinic    2  HL patient with recent CT scan that showed calcification of the coronary arteries.Asymptomatic   LDL 58  HDL 23  Trig 104    3.  CAD.  Again found on CT scan.  Patient asymptomatic  Continue to follow.     4 bradycardia HR 60    Asymptomatic   F/U in HTN clinic in 6 wks   Bring log F/U with me in July    Current medicines are reviewed at length with the patient today.  The patient does not have concerns regarding medicines.  Signed, Dorris Carnes, MD  12/24/2020 11:25 AM    [address], [place], [place]  [address] Phone: [phone]; Fax: [phone]

## 2020-12-24 NOTE — Patient Instructions (Signed)
Medication Instructions:   START Amlodipine one tablet by mouth ( 2.5 mg) daily.  *If you need a refill on your cardiac medications before your next appointment, please call your pharmacy*   Lab Work:  -NONE  If you have labs (blood work) drawn today and your tests are completely normal, you will receive your results only by: [place] (if you have MyChart) OR A paper copy in the mail If you have any lab test that is abnormal or we need to change your treatment, we will call you to review the results.   Testing/Procedures:  -NONE   Follow-Up: At [facility], you and your health needs are our priority.  As part of our continuing mission to provide you with exceptional heart care, we have created designated Provider Care Teams.  These Care Teams include your primary Cardiologist (physician) and Advanced Practice Providers (APPs -  Physician Assistants and Nurse Practitioners) who all work together to provide you with the care you need, when you need it.  We recommend signing up for the patient portal called "MyChart".  Sign up information is provided on this After Visit Summary.  MyChart is used to connect with patients for Virtual Visits (Telemedicine).  Patients are able to view lab/test results, encounter notes, upcoming appointments, etc.  Non-urgent messages can be sent to your provider as well.   To learn more about what you can do with MyChart, go to [email or web address].    Your next appointment:   9 month(s)  The format for your next appointment:   In Person  Provider:   Dorris Carnes, MD   Other Instructions Your physician wants you to follow-up in: 9 months with Dr. Harrington Challenger.  You will receive a reminder letter in the mail two months in advance. If you don't receive a letter, please call our office to schedule the follow-up appointment.  You have been referred to the Hypertension Clinic.

## 2020-12-31 DIAGNOSIS — D1801 Hemangioma of skin and subcutaneous tissue: Secondary | ICD-10-CM | POA: Diagnosis not present

## 2020-12-31 DIAGNOSIS — L821 Other seborrheic keratosis: Secondary | ICD-10-CM | POA: Diagnosis not present

## 2020-12-31 DIAGNOSIS — M940 Chondrocostal junction syndrome [Tietze]: Secondary | ICD-10-CM | POA: Diagnosis not present

## 2020-12-31 DIAGNOSIS — L570 Actinic keratosis: Secondary | ICD-10-CM | POA: Diagnosis not present

## 2020-12-31 DIAGNOSIS — Z85820 Personal history of malignant melanoma of skin: Secondary | ICD-10-CM | POA: Diagnosis not present

## 2020-12-31 DIAGNOSIS — Z85828 Personal history of other malignant neoplasm of skin: Secondary | ICD-10-CM | POA: Diagnosis not present

## 2020-12-31 DIAGNOSIS — L812 Freckles: Secondary | ICD-10-CM | POA: Diagnosis not present

## 2021-01-01 NOTE — Progress Notes (Signed)
Assessment/Plan:    1.  Essential Tremor, L>>R  -Patient would like to continue primidone, 50 mg, 4 tablets in the morning and 1 tablet at bedtime.  We tried to increase his primidone to 250 mg, 1 in the morning and continue the 50 mg at night, but he tried it for just for short period, but he had side effects and went back down.  -Patient is not interested in DBS or focused ultrasound   2.  Follow-up 6 to 9 months. Subjective:   [name] was seen today in follow up for essential tremor.  My previous records were reviewed prior to todays visit.  I last saw him through video visit in May, but my actual last in person visit was May, 2021.  Pt denies falls.  Pt denies lightheadedness, near syncope.  No hallucinations.  Mood has been good.  Has been seen in the emergency room as well as cardiology for high blood pressure.  Amlodipine was started recently by Dr. Harrington Challenger.  We increased primidone last visit but he didn't do that long as didn't feel great on higher dosage.  He states that tremor was worse on the L hand for a while but that has settled down for a bit.  Still able to write checks and sign name.  It did "interfere with my fishing."  Current prescribed movement disorder medications: Primidone, 250 mg, 1 tablet in the morning and primidone, 50 mg at bedtime (states that he did not go to the 250 mg dosage for a long -had side effects when he tried it) - 4 in the AM, 1 at evening    ALLERGIES:   Allergies  Allergen Reactions   Tape Other (See Comments) and Rash    Surgical tape=redness/rash at site    CURRENT MEDICATIONS:  Outpatient Encounter Medications as of 01/03/2021  Medication Sig   amLODipine (NORVASC) 2.5 MG tablet Take 1 tablet (2.5 mg total) by mouth daily.   ascorbic acid (VITAMIN C) 1000 MG tablet Take 1,000 mg by mouth daily. Pt takes 5 times a week   LORazepam (ATIVAN) 0.5 MG tablet Take 0.5 mg by mouth at bedtime as needed for anxiety (restless legs).    losartan (COZAAR) 25 MG tablet Take 25 mg by mouth daily.   Multiple Vitamin (MULTIVITAMIN WITH MINERALS) TABS tablet Take 1 tablet by mouth 2 (two) times a week.    prednisoLONE 5 MG TABS tablet Take by mouth. Stops on 01/05/21   primidone (MYSOLINE) 250 MG tablet Take 1 tablet (250 mg total) by mouth every morning.   primidone (MYSOLINE) 50 MG tablet Take 1 tablet (50 mg total) by mouth at bedtime.   rosuvastatin (CRESTOR) 10 MG tablet Take 1 tablet (10 mg total) by mouth daily.   zolpidem (AMBIEN CR) 12.5 MG CR tablet Take 6.25 mg by mouth at bedtime.   Vibegron (GEMTESA) 75 MG TABS Take 1 tablet by mouth daily.   [DISCONTINUED] cephALEXin (KEFLEX) 500 MG capsule Take 2 capsules (1,000 mg total) by mouth 2 (two) times daily. (Patient not taking: Reported on 12/24/2020)   [DISCONTINUED] imipramine (TOFRANIL) 25 MG tablet Take 1 tablet by mouth at bedtime.   [DISCONTINUED] losartan-hydrochlorothiazide (HYZAAR) 50-12.5 MG tablet Take 1 tablet by mouth daily. (Patient not taking: Reported on 12/24/2020)   [DISCONTINUED] meloxicam (MOBIC) 15 MG tablet Take 1 tablet by mouth daily as needed.   No facility-administered encounter medications on file as of 01/03/2021.     Objective:  PHYSICAL EXAMINATION:    VITALS:   Vitals:   01/03/21 1101  BP: (!) 150/66  Pulse: 62  SpO2: 97%  Weight: 176 lb (79.8 kg)  Height: 5\' 11"  (1.803 m)    GEN:  The patient appears stated age and is in NAD. HEENT:  Normocephalic, atraumatic.  The mucous membranes are moist. The superficial temporal arteries are without ropiness or tenderness. CV:  RRR Lungs:  CTAB Neck/HEME:  There are no carotid bruits bilaterally.  Neurological examination:  Orientation: The patient is alert and oriented x3. Cranial nerves: There is good facial symmetry. The speech is fluent and clear. Soft palate rises symmetrically and there is no tongue deviation. Hearing is intact to conversational tone. Sensation: Sensation is  intact to light touch throughout Motor: Strength is at least antigravity x4.  Movement examination: Tone: There is normal tone in the UE/LE Abnormal movements: There is just mild tremor of the outstretched hands and with intention.  It becomes more significant when he draws Archimedes spirals, especially when he first approximates the paper, left greater than right.  I have reviewed and interpreted the following labs independently   Chemistry      Component Value Date/Time   NA 136 07/31/2020 1151   NA 141 01/06/2017 0820   K 4.2 07/31/2020 1151   K 4.2 01/06/2017 0820   CL 102 07/31/2020 1151   CO2 26 07/31/2020 1151   CO2 26 01/06/2017 0820   BUN 20 07/31/2020 1151   BUN 28.9 (H) 01/06/2017 0820   CREATININE 0.83 07/31/2020 1151   CREATININE 1.1 01/06/2017 0820      Component Value Date/Time   CALCIUM 9.7 07/31/2020 1151   CALCIUM 9.7 01/06/2017 0820   ALKPHOS 93 07/31/2020 1151   ALKPHOS 41 01/06/2017 0820   AST 18 07/31/2020 1151   AST 19 01/06/2017 0820   ALT 17 07/31/2020 1151   ALT 16 01/06/2017 0820   BILITOT 0.4 07/31/2020 1151   BILITOT 0.43 01/06/2017 0820      Lab Results  Component Value Date   WBC 6.3 07/31/2020   HGB 11.5 (L) 07/31/2020   HCT 34.6 (L) 07/31/2020   MCV 88.9 07/31/2020   PLT 209 07/31/2020   No results found for: TSH   Chemistry      Component Value Date/Time   NA 136 07/31/2020 1151   NA 141 01/06/2017 0820   K 4.2 07/31/2020 1151   K 4.2 01/06/2017 0820   CL 102 07/31/2020 1151   CO2 26 07/31/2020 1151   CO2 26 01/06/2017 0820   BUN 20 07/31/2020 1151   BUN 28.9 (H) 01/06/2017 0820   CREATININE 0.83 07/31/2020 1151   CREATININE 1.1 01/06/2017 0820      Component Value Date/Time   CALCIUM 9.7 07/31/2020 1151   CALCIUM 9.7 01/06/2017 0820   ALKPHOS 93 07/31/2020 1151   ALKPHOS 41 01/06/2017 0820   AST 18 07/31/2020 1151   AST 19 01/06/2017 0820   ALT 17 07/31/2020 1151   ALT 16 01/06/2017 0820   BILITOT 0.4 07/31/2020  1151   BILITOT 0.43 01/06/2017 0820        Cc:  Antony Contras, MD

## 2021-01-03 ENCOUNTER — Other Ambulatory Visit: Payer: Self-pay

## 2021-01-03 ENCOUNTER — Encounter: Payer: Self-pay | Admitting: Neurology

## 2021-01-03 ENCOUNTER — Ambulatory Visit (INDEPENDENT_AMBULATORY_CARE_PROVIDER_SITE_OTHER): Payer: Medicare Other | Admitting: Neurology

## 2021-01-03 DIAGNOSIS — G250 Essential tremor: Secondary | ICD-10-CM | POA: Diagnosis not present

## 2021-01-03 DIAGNOSIS — I2510 Atherosclerotic heart disease of native coronary artery without angina pectoris: Secondary | ICD-10-CM

## 2021-01-03 MED ORDER — PRIMIDONE 50 MG PO TABS: 50.0000 mg | ORAL_TABLET | Freq: Every day | ORAL | 2 refills | Status: DC

## 2021-01-03 NOTE — Patient Instructions (Signed)
Essential Tremor °A tremor is trembling or shaking that a person cannot control. Most tremors affect the hands or arms. Tremors can also affect the head, vocal cords, legs, and other parts of the body. Essential tremor is a tremor without a known cause. Usually, it occurs while a person is trying to perform an action. It tends to get worse gradually as a person ages. °What are the causes? °The cause of this condition is not known. °What increases the risk? °You are more likely to develop this condition if: °You have a family member with essential tremor. °You are age 40 or older. °You take certain medicines. °What are the signs or symptoms? °The main sign of a tremor is a rhythmic shaking of certain parts of your body that is uncontrolled and unintentional. You may: °Have difficulty eating with a spoon or fork. °Have difficulty writing. °Nod your head up and down or side to side. °Have a quivering voice. °The shaking may: °Get worse over time. °Come and go. °Be more noticeable on one side of your body. °Get worse due to stress, fatigue, caffeine, and extreme heat or cold. °How is this diagnosed? °This condition may be diagnosed based on: °Your symptoms and medical history. °A physical exam. °There is no single test to diagnose an essential tremor. However, your health care provider may order tests to rule out other causes of your condition. These may include: °Blood and urine tests. °Imaging studies of your brain, such as CT scan and MRI. °A test that measures involuntary muscle movement (electromyogram). °How is this treated? °Treatment for essential tremor depends on the severity of the condition. °Some tremors may go away without treatment. °Mild tremors may not need treatment if they do not affect your day-to-day life. °Severe tremors may need to be treated using one or more of the following options: °Medicines. °Lifestyle changes. °Occupational or physical therapy. °Follow these instructions at  home: °Lifestyle ° °Do not use any products that contain nicotine or tobacco, such as cigarettes and e-cigarettes. If you need help quitting, ask your health care provider. °Limit your caffeine intake as told by your health care provider. °Try to get 8 hours of sleep each night. °Find ways to manage your stress that fits your lifestyle and personality. Consider trying meditation or yoga. °Try to anticipate stressful situations and allow extra time to manage them. °If you are struggling emotionally with the effects of your tremor, consider working with a mental health provider. °General instructions °Take over-the-counter and prescription medicines only as told by your health care provider. °Avoid extreme heat and extreme cold. °Keep all follow-up visits as told by your health care provider. This is important. Visits may include physical therapy visits. °Contact a health care provider if: °You experience any changes in the location or intensity of your tremors. °You start having a tremor after starting a new medicine. °You have tremor with other symptoms, such as: °Numbness. °Tingling. °Pain. °Weakness. °Your tremor gets worse. °Your tremor interferes with your daily life. °You feel down, blue, or sad for at least 2 weeks in a row. °Worrying about your tremor and what other people think about you interferes with your everyday life functions, including relationships, work, or school. °Summary °Essential tremor is a tremor without a known cause. Usually, it occurs when you are trying to perform an action. °You are more likely to develop this condition if you have a family member with essential tremor. °The main sign of a tremor is a rhythmic shaking of   certain parts of your body that is uncontrolled and unintentional. °Treatment for essential tremor depends on the severity of the condition. °This information is not intended to replace advice given to you by your health care provider. Make sure you discuss any questions  you have with your health care provider. °Document Revised: 11/02/2019 Document Reviewed: 11/04/2019 °Elsevier Patient Education © 2022 [employer]. ° °

## 2021-01-06 ENCOUNTER — Other Ambulatory Visit: Payer: Self-pay | Admitting: Neurology

## 2021-01-06 DIAGNOSIS — G250 Essential tremor: Secondary | ICD-10-CM

## 2021-01-07 ENCOUNTER — Other Ambulatory Visit: Payer: Self-pay

## 2021-01-11 ENCOUNTER — Telehealth: Payer: Self-pay | Admitting: Neurology

## 2021-01-11 DIAGNOSIS — U071 COVID-19: Secondary | ICD-10-CM | POA: Diagnosis not present

## 2021-01-11 NOTE — Telephone Encounter (Signed)
Called patient he has already take to let them know Dr. Carles Collet does not recommend stopping medication cold [place] and there eis another medication he could take  that does not interfere with the Primidone en the first dose of Plaxlovid and is calling d

## 2021-01-11 NOTE — Telephone Encounter (Signed)
Pt called in stating he tested positive for Covid-19 and his PCP wants him to start the antiviral, but he would have to stop taking his primidone for 10 days. He would like Dr. Doristine Devoid advice on this.

## 2021-01-11 NOTE — Telephone Encounter (Signed)
Called patient back and let him know the name of medication

## 2021-01-11 NOTE — Telephone Encounter (Signed)
Pt called back in wanting to get the name of the other drug Dr. Carles Collet might recommend for his Covid. He wants to make sure he speaks with someone before the end of the day.

## 2021-01-15 DIAGNOSIS — U071 COVID-19: Secondary | ICD-10-CM | POA: Diagnosis not present

## 2021-01-31 DIAGNOSIS — G4700 Insomnia, unspecified: Secondary | ICD-10-CM | POA: Diagnosis not present

## 2021-01-31 DIAGNOSIS — Z1389 Encounter for screening for other disorder: Secondary | ICD-10-CM | POA: Diagnosis not present

## 2021-01-31 DIAGNOSIS — E7800 Pure hypercholesterolemia, unspecified: Secondary | ICD-10-CM | POA: Diagnosis not present

## 2021-01-31 DIAGNOSIS — Z8781 Personal history of (healed) traumatic fracture: Secondary | ICD-10-CM | POA: Diagnosis not present

## 2021-01-31 DIAGNOSIS — F419 Anxiety disorder, unspecified: Secondary | ICD-10-CM | POA: Diagnosis not present

## 2021-01-31 DIAGNOSIS — I10 Essential (primary) hypertension: Secondary | ICD-10-CM | POA: Diagnosis not present

## 2021-01-31 DIAGNOSIS — C61 Malignant neoplasm of prostate: Secondary | ICD-10-CM | POA: Diagnosis not present

## 2021-01-31 DIAGNOSIS — M25552 Pain in left hip: Secondary | ICD-10-CM | POA: Diagnosis not present

## 2021-01-31 DIAGNOSIS — R1319 Other dysphagia: Secondary | ICD-10-CM | POA: Diagnosis not present

## 2021-01-31 DIAGNOSIS — Z0000 Encounter for general adult medical examination without abnormal findings: Secondary | ICD-10-CM | POA: Diagnosis not present

## 2021-01-31 DIAGNOSIS — G250 Essential tremor: Secondary | ICD-10-CM | POA: Diagnosis not present

## 2021-01-31 DIAGNOSIS — Z8616 Personal history of COVID-19: Secondary | ICD-10-CM | POA: Diagnosis not present

## 2021-02-06 ENCOUNTER — Ambulatory Visit: Payer: Medicare Other

## 2021-02-25 NOTE — Progress Notes (Signed)
Patient ID: [name]                 DOB: 1945-04-30                      MRN: [id number]     HPI: [name] is a 76 y.o. male referred by Dr. Harrington Challenger to HTN clinic. PMH is significant for HTN, HLD, chest pain, prostate cancer, CSF leak, essential tremor. CT scan in March 2021 showed atherosclerosis of aorta and coronary arteries. Seen in the ED September 2022 for HTN with reported BP 200s/100s at home but asymptomatic and was not significantly elevated in ED.   Today, patient arrives in good spirits. Reports home BP has been improved since starting amlodipine and brings log of readings. He realized that his old wrist cuff was reading about 20 points higher than it should so he has switched to using a newer bicep cuff that seems to be more accurate. Does not have this with him today to check for accuracy but states that reading in clinic is similar to home reading. He takes his medication in the morning and has taken it today. Denies any problems or adverse effects with these medications. Denies dizziness, headache, blurred vision, swelling.   Current HTN meds: amlodipine 2.5 mg daily, losartan 25 mg daily Previously tried: lisinopril-HCTZ (he is not sure why this was switched), losartan-HCTZ (experienced cough with losartan so this was stopped then resumed at a lower dose as losartan alone) BP goal: <130/80 mmHg  Family History: Heart attack in father, HTN in father and sister  Social History: Never smoker  Diet: Endorses low salt diet  Exercise: Exercises regularly (walks)  Home BP readings: Uses bicep cuff. Recent readings: 127/72, 129/67, 143/85, 128/68, 121/61, 128/92, 136/70  Labs:  -07/31/20: Scr 0.83, K 4.2, Na 136 (losartan 25 mg)  Wt Readings from Last 3 Encounters:  01/03/21 176 lb (79.8 kg)  12/24/20 175 lb 6.4 oz (79.6 kg)  11/04/20 176 lb (79.8 kg)   BP Readings from Last 3 Encounters:  01/03/21 (!) 150/66  12/24/20 140/80  11/04/20 (!) 160/75   Pulse Readings  from Last 3 Encounters:  01/03/21 62  12/24/20 60  11/04/20 66    Renal function: CrCl cannot be calculated (Patient's most recent lab result is older than the maximum 21 days allowed.).  Past Medical History:  Diagnosis Date   Arthritis    hands    Cancer ([place])    prostate cancer    Hyperlipidemia    Hypertension    Prostate cancer ([place])    Recurrent upper respiratory infection (URI)    pt had a cold recent- week ago - better now    Tremor     Current Outpatient Medications on File Prior to Visit  Medication Sig Dispense Refill   amLODipine (NORVASC) 2.5 MG tablet Take 1 tablet (2.5 mg total) by mouth daily. 90 tablet 3   ascorbic acid (VITAMIN C) 1000 MG tablet Take 1,000 mg by mouth daily. Pt takes 5 times a week     LORazepam (ATIVAN) 0.5 MG tablet Take 0.5 mg by mouth at bedtime as needed for anxiety (restless legs).     losartan (COZAAR) 25 MG tablet Take 25 mg by mouth daily.     Multiple Vitamin (MULTIVITAMIN WITH MINERALS) TABS tablet Take 1 tablet by mouth 2 (two) times a week.      prednisoLONE 5 MG TABS tablet Take by mouth. Stops on  01/05/21     primidone (MYSOLINE) 50 MG tablet TAKE 4 TABLETS BY MOUTH IN THE MORNING 1 TABLET AT BEDTIME 450 tablet 1   rosuvastatin (CRESTOR) 10 MG tablet Take 1 tablet (10 mg total) by mouth daily. 90 tablet 3   Vibegron (GEMTESA) 75 MG TABS Take 1 tablet by mouth daily.     zolpidem (AMBIEN CR) 12.5 MG CR tablet Take 6.25 mg by mouth at bedtime.     No current facility-administered medications on file prior to visit.    Allergies  Allergen Reactions   Tape Other (See Comments) and Rash    Surgical tape=redness/rash at site     Assessment/Plan:  1. Hypertension - BP at goal <130/80 mmHg in clinic today after starting amlodipine at last visit. Most of home readings at goal as well. Continue current medications: amlodipine 2.5 mg daily and losartan 25 mg daily. Of note, primidone can decrease concentrations of amlodipine.  Continue checking BP once daily and bring log to future visits. Provided patient with office phone number and instructed him to call if he notices his BP going up or experiences symptoms of high BP. Otherwise, follow up with HTN clinic as needed and with Dr. Harrington Challenger as planned.    Rebbeca Paul, PharmD PGY2 Ambulatory Care Pharmacy Resident 02/26/2021 11:30 AM

## 2021-02-26 ENCOUNTER — Other Ambulatory Visit: Payer: Self-pay

## 2021-02-26 ENCOUNTER — Ambulatory Visit (INDEPENDENT_AMBULATORY_CARE_PROVIDER_SITE_OTHER): Payer: Medicare Other | Admitting: Student-PharmD

## 2021-02-26 VITALS — BP 124/78 | HR 62

## 2021-02-26 DIAGNOSIS — I10 Essential (primary) hypertension: Secondary | ICD-10-CM

## 2021-02-26 NOTE — Patient Instructions (Signed)
It was nice to see you today!  Your goal blood pressure is less than 130/80 mmHg. In clinic, your blood pressure was 124/78 mmHg.  Medication Changes:  Continue amlodipine 2.5 mg daily and losartan 25 mg daily.   Monitor blood pressure at home daily and keep a log (on your phone or piece of paper) to bring with you to your next visit. Write down date, time, blood pressure and pulse.  Keep up the good work with diet and exercise. Aim for a diet full of vegetables, fruit and lean meats (chicken, [place], fish). Try to limit salt intake by eating fresh or frozen vegetables (instead of canned), rinse canned vegetables prior to cooking and do not add any additional salt to meals.   Give [place] a call at [phone] if needed.

## 2021-03-13 ENCOUNTER — Other Ambulatory Visit: Payer: Self-pay

## 2021-03-13 ENCOUNTER — Inpatient Hospital Stay: Payer: Medicare Other | Attending: Oncology

## 2021-03-13 ENCOUNTER — Ambulatory Visit (HOSPITAL_COMMUNITY)
Admission: RE | Admit: 2021-03-13 | Discharge: 2021-03-13 | Disposition: A | Discharge status: Home / Self Care | Payer: Medicare Other | Source: Ambulatory Visit | Attending: Oncology | Admitting: Oncology

## 2021-03-13 DIAGNOSIS — I700 Atherosclerosis of aorta: Secondary | ICD-10-CM | POA: Diagnosis not present

## 2021-03-13 DIAGNOSIS — C61 Malignant neoplasm of prostate: Secondary | ICD-10-CM

## 2021-03-13 DIAGNOSIS — R911 Solitary pulmonary nodule: Secondary | ICD-10-CM | POA: Diagnosis not present

## 2021-03-13 LAB — CBC WITH DIFFERENTIAL (CANCER CENTER ONLY)
Abs Immature Granulocytes: 0.01 10*3/uL (ref 0.00–0.07)
Basophils Absolute: 0.1 10*3/uL (ref 0.0–0.1)
Basophils Relative: 1 %
Eosinophils Absolute: 0.2 10*3/uL (ref 0.0–0.5)
Eosinophils Relative: 2 %
HCT: 39.1 % (ref 39.0–52.0)
Hemoglobin: 12.7 g/dL — ABNORMAL LOW (ref 13.0–17.0)
Immature Granulocytes: 0 %
Lymphocytes Relative: 18 %
Lymphs Abs: 1.2 10*3/uL (ref 0.7–4.0)
MCH: 28.7 pg (ref 26.0–34.0)
MCHC: 32.5 g/dL (ref 30.0–36.0)
MCV: 88.5 fL (ref 80.0–100.0)
Monocytes Absolute: 0.6 10*3/uL (ref 0.1–1.0)
Monocytes Relative: 9 %
Neutro Abs: 4.7 10*3/uL (ref 1.7–7.7)
Neutrophils Relative %: 70 %
Platelet Count: 207 10*3/uL (ref 150–400)
RBC: 4.42 MIL/uL (ref 4.22–5.81)
RDW: 13.6 % (ref 11.5–15.5)
WBC Count: 6.8 10*3/uL (ref 4.0–10.5)
nRBC: 0 % (ref 0.0–0.2)

## 2021-03-13 LAB — CMP (CANCER CENTER ONLY)
ALT: 13 U/L (ref 0–44)
AST: 17 U/L (ref 15–41)
Albumin: 4.4 g/dL (ref 3.5–5.0)
Alkaline Phosphatase: 66 U/L (ref 38–126)
Anion gap: 6 (ref 5–15)
BUN: 23 mg/dL (ref 8–23)
CO2: 29 mmol/L (ref 22–32)
Calcium: 10.4 mg/dL — ABNORMAL HIGH (ref 8.9–10.3)
Chloride: 103 mmol/L (ref 98–111)
Creatinine: 0.9 mg/dL (ref 0.61–1.24)
GFR, Estimated: >60 mL/min (ref >60)
Glucose, Bld: 93 mg/dL (ref 70–99)
Potassium: 4.3 mmol/L (ref 3.5–5.1)
Sodium: 138 mmol/L (ref 135–145)
Total Bilirubin: 0.3 mg/dL (ref 0.3–1.2)
Total Protein: 7.8 g/dL (ref 6.5–8.1)

## 2021-03-13 MED ORDER — IOHEXOL 300 MG/ML  SOLN
100.0000 mL | Freq: Once | INTRAMUSCULAR | Status: AC | PRN
  Administered 2021-03-13: 100 mL via INTRAVENOUS

## 2021-03-14 LAB — PROSTATE-SPECIFIC AG, SERUM (LABCORP): Prostate Specific Ag, Serum: <0.1 ng/mL (ref 0.0–4.0)

## 2021-03-15 DIAGNOSIS — Z20822 Contact with and (suspected) exposure to covid-19: Secondary | ICD-10-CM | POA: Diagnosis not present

## 2021-03-20 ENCOUNTER — Telehealth: Payer: Self-pay | Admitting: Internal Medicine

## 2021-03-20 ENCOUNTER — Inpatient Hospital Stay (HOSPITAL_BASED_OUTPATIENT_CLINIC_OR_DEPARTMENT_OTHER): Payer: Medicare Other | Admitting: Oncology

## 2021-03-20 VITALS — BP 140/62 | HR 65 | Temp 97.7°F | Resp 18 | Ht 71.0 in | Wt 179.3 lb

## 2021-03-20 DIAGNOSIS — C61 Malignant neoplasm of prostate: Secondary | ICD-10-CM | POA: Insufficient documentation

## 2021-03-20 NOTE — Telephone Encounter (Signed)
Pt would like to make sure Dr. Harrington Challenger sees his most recent CT scan showing aneurysm. Aware will forward to MD & RN to review/address. Aware it may be next week before he hears back from our office. Patient verbalized understanding and agreeable to plan.

## 2021-03-20 NOTE — Telephone Encounter (Signed)
Pt just got ct results and would like to go over them with Dr. Harrington Challenger... please advise

## 2021-03-20 NOTE — Progress Notes (Signed)
Hematology and Oncology Follow Up Visit  [name] [id number] 05-29-1945 76 y.o. 03/20/2021 1:34 PM    Principle Diagnosis: 76 year old man with T2N1 prostate cancer with neuroendocrine differentiation diagnosed in 2012.  He developed and biochemical relapse after initial presentation with Gleason score 4+3 = 7.   Prior Therapy: He presented with a PSA of 4.38 and subsequently underwent a radical prostatectomy and lymph node dissection on November of 2012. 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  He did not have any evidence of angiolymphatic invasion or extraprostatic extension. The margins were negative. He had one out of 12 lymph nodes involved with cancer predominantly in the right pelvic wall.  His post operative PSA remained at 0.65 and subsequently to 0.89. And in March of 2013 he was started on Lupron and have been on it since that time. His PSA remains very low  0.01.  His PSA went up to 0.3 in March 2017. He completed salvage radiation therapy completed in June 2017 under the care of Dr. Tammi Klippel. He received planned dose of 68.4 grays. Received intermittent androgen deprivation therapy last treatment was in 2017.  Current therapy: Active surveillance only.  Interim History:  [name] returns today for a follow-up visit.  Since the last visit, he reports no major changes in his health.  He denies any recent hospitalizations or illnesses.  He denies any chest pain shortness of breath, cough or wheezing.  He denies any abdominal pain or discomfort.  His performance status quality of life remain excellent.   Medications: Updated on review. Current Outpatient Medications  Medication Sig Dispense Refill   amLODipine (NORVASC) 2.5 MG tablet Take 1 tablet (2.5 mg total) by mouth daily. 90 tablet 3   ascorbic acid (VITAMIN C) 1000 MG tablet Take 1,000 mg by mouth daily. Pt takes 5 times a week     LORazepam  (ATIVAN) 0.5 MG tablet Take 0.5 mg by mouth at bedtime as needed for anxiety (restless legs).     losartan (COZAAR) 25 MG tablet Take 25 mg by mouth daily.     Multiple Vitamin (MULTIVITAMIN WITH MINERALS) TABS tablet Take 1 tablet by mouth 2 (two) times a week.      prednisoLONE 5 MG TABS tablet Take by mouth. Stops on 01/05/21     primidone (MYSOLINE) 50 MG tablet TAKE 4 TABLETS BY MOUTH IN THE MORNING 1 TABLET AT BEDTIME 450 tablet 1   rosuvastatin (CRESTOR) 10 MG tablet Take 1 tablet (10 mg total) by mouth daily. 90 tablet 3   Vibegron (GEMTESA) 75 MG TABS Take 1 tablet by mouth daily.     zolpidem (AMBIEN CR) 12.5 MG CR tablet Take 6.25 mg by mouth at bedtime.     No current facility-administered medications for this visit.     Allergies:  Allergies  Allergen Reactions   Tape Other (See Comments) and Rash    Surgical tape=redness/rash at site      Physical Exam: Blood pressure 140/62, pulse 65, temperature 97.7 F (36.5 C), temperature source Temporal, resp. rate 18, height 5\' 11"  (1.803 m), weight 179 lb 4.8 oz (81.3 kg), SpO2 99 %.  ECOG: 0    General appearance: Alert, awake without any distress. Head: Atraumatic without abnormalities Oropharynx: Without any thrush or ulcers. Eyes: No scleral icterus. Lymph nodes: No lymphadenopathy noted in the cervical, supraclavicular, or axillary nodes Heart:regular rate and rhythm, without any murmurs or gallops.   Lung: Clear to  auscultation without any rhonchi, wheezes or dullness to percussion. Abdomin: Soft, nontender without any shifting dullness or ascites. Musculoskeletal: No clubbing or cyanosis. Neurological: No motor or sensory deficits. Skin: No rashes or lesions.    Lab Results: Lab Results  Component Value Date   WBC 6.8 03/13/2021   HGB 12.7 (L) 03/13/2021   HCT 39.1 03/13/2021   MCV 88.5 03/13/2021   PLT 207 03/13/2021     Chemistry      Component Value Date/Time   NA 138 03/13/2021 1151   NA 141  01/06/2017 0820   K 4.3 03/13/2021 1151   K 4.2 01/06/2017 0820   CL 103 03/13/2021 1151   CO2 29 03/13/2021 1151   CO2 26 01/06/2017 0820   BUN 23 03/13/2021 1151   BUN 28.9 (H) 01/06/2017 0820   CREATININE 0.90 03/13/2021 1151   CREATININE 1.1 01/06/2017 0820      Component Value Date/Time   CALCIUM 10.4 (H) 03/13/2021 1151   CALCIUM 9.7 01/06/2017 0820   ALKPHOS 66 03/13/2021 1151   ALKPHOS 41 01/06/2017 0820   AST 17 03/13/2021 1151   AST 19 01/06/2017 0820   ALT 13 03/13/2021 1151   ALT 16 01/06/2017 0820   BILITOT 0.3 03/13/2021 1151   BILITOT 0.43 01/06/2017 0820      Latest Reference Range & Units 07/31/20 11:51 03/13/21 11:51  Prostate Specific Ag, Serum 0.0 - 4.0 ng/mL <0.1 <0.1     IMPRESSION: 1. No findings of active malignancy. 2. Ascending thoracic aortic aneurysm 4.1 cm in diameter. Recommend annual imaging followup by CTA or MRA. This recommendation follows 2010 ACCF/AHA/AATS/ACR/ASA/SCA/SCAI/SIR/STS/SVM Guidelines for the Diagnosis and Management of Patients with Thoracic Aortic Disease. Circulation. 2010; 121: G536-I680. Aortic aneurysm NOS (ICD10-I71.9) 3. Other imaging findings of potential clinical significance: Aortic Atherosclerosis (ICD10-I70.0). Coronary and systemic atherosclerosis. Several tiny chronically stable benign pulmonary nodules. Prostatectomy. 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  Impression and Plan:  76 year old man with:  1.  Prostate cancer diagnosed in 2012.  He developed biochemical relapse after prostatectomy for a tumor with neuroendocrine features.  He is currently on active surveillance without any evidence of relapsed disease or treatment needed.  CT scan obtained on March 13, 2021 was personally reviewed and showed no evidence of metastatic disease.  His PSA continues to be undetectable and overall have recommended active surveillance.   2.  Coronary artery disease and aneurysm: He has follow-up  with cardiology regarding this issue.  3. Follow-up: He will return in 6 months for repeat laboratory testing and MD evaluation.  We will repeat imaging studies in 1 year from now.  30 minutes were spent on this encounter.  The time was spent reviewing laboratory data, disease status update, treatment choices and future plan of care discussion.   Zola Button, MD 1/25/20231:34 PM

## 2021-03-21 ENCOUNTER — Telehealth: Payer: Self-pay | Admitting: Oncology

## 2021-03-21 NOTE — Telephone Encounter (Signed)
Scheduled appointment per 1/25 los. Patient is aware. Patient will be mailed an updated calendar. °

## 2021-03-22 NOTE — Telephone Encounter (Signed)
CT scan shows just minimal dilation of aorta  41 mm    Very mild  Will follow periodically    Very low risk

## 2021-03-25 NOTE — Telephone Encounter (Signed)
Left a message for the pt to call back.  

## 2021-03-25 NOTE — Telephone Encounter (Signed)
Patient is returning call. He states he has a few concerns with the results.

## 2021-03-25 NOTE — Telephone Encounter (Signed)
Pt advised and verbalized understanding. He has yearly CT's with his Oncologist.

## 2021-03-29 DIAGNOSIS — M7022 Olecranon bursitis, left elbow: Secondary | ICD-10-CM | POA: Diagnosis not present

## 2021-04-09 DIAGNOSIS — L309 Dermatitis, unspecified: Secondary | ICD-10-CM | POA: Diagnosis not present

## 2021-04-09 DIAGNOSIS — M7022 Olecranon bursitis, left elbow: Secondary | ICD-10-CM | POA: Diagnosis not present

## 2021-04-25 DIAGNOSIS — B349 Viral infection, unspecified: Secondary | ICD-10-CM | POA: Diagnosis not present

## 2021-04-25 DIAGNOSIS — Z20822 Contact with and (suspected) exposure to covid-19: Secondary | ICD-10-CM | POA: Diagnosis not present

## 2021-04-25 DIAGNOSIS — R0982 Postnasal drip: Secondary | ICD-10-CM | POA: Diagnosis not present

## 2021-04-25 DIAGNOSIS — R0981 Nasal congestion: Secondary | ICD-10-CM | POA: Diagnosis not present

## 2021-04-25 DIAGNOSIS — R059 Cough, unspecified: Secondary | ICD-10-CM | POA: Diagnosis not present

## 2021-04-29 DIAGNOSIS — J019 Acute sinusitis, unspecified: Secondary | ICD-10-CM | POA: Diagnosis not present

## 2021-05-07 DIAGNOSIS — Z20822 Contact with and (suspected) exposure to covid-19: Secondary | ICD-10-CM | POA: Diagnosis not present

## 2021-05-10 DIAGNOSIS — I10 Essential (primary) hypertension: Secondary | ICD-10-CM | POA: Diagnosis not present

## 2021-05-10 DIAGNOSIS — H6123 Impacted cerumen, bilateral: Secondary | ICD-10-CM | POA: Diagnosis not present

## 2021-05-13 ENCOUNTER — Other Ambulatory Visit: Payer: Self-pay | Admitting: Family Medicine

## 2021-05-13 ENCOUNTER — Ambulatory Visit
Admission: RE | Admit: 2021-05-13 | Discharge: 2021-05-13 | Disposition: A | Discharge status: Home / Self Care | Payer: Medicare Other | Source: Ambulatory Visit | Attending: Family Medicine | Admitting: Family Medicine

## 2021-05-13 ENCOUNTER — Other Ambulatory Visit: Payer: Self-pay

## 2021-05-13 DIAGNOSIS — H6123 Impacted cerumen, bilateral: Secondary | ICD-10-CM | POA: Diagnosis not present

## 2021-05-13 DIAGNOSIS — R5383 Other fatigue: Secondary | ICD-10-CM | POA: Diagnosis not present

## 2021-05-13 DIAGNOSIS — R059 Cough, unspecified: Secondary | ICD-10-CM

## 2021-05-22 DIAGNOSIS — U071 COVID-19: Secondary | ICD-10-CM | POA: Diagnosis not present

## 2021-05-24 DIAGNOSIS — Z20822 Contact with and (suspected) exposure to covid-19: Secondary | ICD-10-CM | POA: Diagnosis not present

## 2021-05-29 DIAGNOSIS — H903 Sensorineural hearing loss, bilateral: Secondary | ICD-10-CM | POA: Diagnosis not present

## 2021-05-29 DIAGNOSIS — H6123 Impacted cerumen, bilateral: Secondary | ICD-10-CM | POA: Diagnosis not present

## 2021-05-29 DIAGNOSIS — R1310 Dysphagia, unspecified: Secondary | ICD-10-CM | POA: Diagnosis not present

## 2021-06-07 DIAGNOSIS — M19011 Primary osteoarthritis, right shoulder: Secondary | ICD-10-CM | POA: Diagnosis not present

## 2021-06-07 DIAGNOSIS — M7581 Other shoulder lesions, right shoulder: Secondary | ICD-10-CM | POA: Diagnosis not present

## 2021-06-10 DIAGNOSIS — Z20822 Contact with and (suspected) exposure to covid-19: Secondary | ICD-10-CM | POA: Diagnosis not present

## 2021-06-11 DIAGNOSIS — Z20822 Contact with and (suspected) exposure to covid-19: Secondary | ICD-10-CM | POA: Diagnosis not present

## 2021-06-26 DIAGNOSIS — N3942 Incontinence without sensory awareness: Secondary | ICD-10-CM | POA: Diagnosis not present

## 2021-06-27 ENCOUNTER — Other Ambulatory Visit: Payer: Self-pay | Admitting: Gastroenterology

## 2021-06-27 DIAGNOSIS — R1310 Dysphagia, unspecified: Secondary | ICD-10-CM

## 2021-06-28 DIAGNOSIS — Z23 Encounter for immunization: Secondary | ICD-10-CM | POA: Diagnosis not present

## 2021-06-29 DIAGNOSIS — Z20822 Contact with and (suspected) exposure to covid-19: Secondary | ICD-10-CM | POA: Diagnosis not present

## 2021-07-01 DIAGNOSIS — D1801 Hemangioma of skin and subcutaneous tissue: Secondary | ICD-10-CM | POA: Diagnosis not present

## 2021-07-01 DIAGNOSIS — Z20822 Contact with and (suspected) exposure to covid-19: Secondary | ICD-10-CM | POA: Diagnosis not present

## 2021-07-01 DIAGNOSIS — Z85828 Personal history of other malignant neoplasm of skin: Secondary | ICD-10-CM | POA: Diagnosis not present

## 2021-07-01 DIAGNOSIS — L821 Other seborrheic keratosis: Secondary | ICD-10-CM | POA: Diagnosis not present

## 2021-07-01 DIAGNOSIS — L570 Actinic keratosis: Secondary | ICD-10-CM | POA: Diagnosis not present

## 2021-07-02 DIAGNOSIS — Z135 Encounter for screening for eye and ear disorders: Secondary | ICD-10-CM | POA: Diagnosis not present

## 2021-07-02 DIAGNOSIS — H524 Presbyopia: Secondary | ICD-10-CM | POA: Diagnosis not present

## 2021-07-02 DIAGNOSIS — H52223 Regular astigmatism, bilateral: Secondary | ICD-10-CM | POA: Diagnosis not present

## 2021-07-02 DIAGNOSIS — H5203 Hypermetropia, bilateral: Secondary | ICD-10-CM | POA: Diagnosis not present

## 2021-07-02 DIAGNOSIS — Z961 Presence of intraocular lens: Secondary | ICD-10-CM | POA: Diagnosis not present

## 2021-07-03 DIAGNOSIS — N5231 Erectile dysfunction following radical prostatectomy: Secondary | ICD-10-CM | POA: Diagnosis not present

## 2021-07-03 DIAGNOSIS — N281 Cyst of kidney, acquired: Secondary | ICD-10-CM | POA: Diagnosis not present

## 2021-07-03 DIAGNOSIS — N393 Stress incontinence (female) (male): Secondary | ICD-10-CM | POA: Diagnosis not present

## 2021-07-03 DIAGNOSIS — Z9079 Acquired absence of other genital organ(s): Secondary | ICD-10-CM | POA: Diagnosis not present

## 2021-07-03 DIAGNOSIS — Z8546 Personal history of malignant neoplasm of prostate: Secondary | ICD-10-CM | POA: Diagnosis not present

## 2021-07-04 ENCOUNTER — Ambulatory Visit
Admission: RE | Admit: 2021-07-04 | Discharge: 2021-07-04 | Disposition: A | Discharge status: Home / Self Care | Payer: Medicare Other | Source: Ambulatory Visit | Attending: Gastroenterology | Admitting: Gastroenterology

## 2021-07-04 DIAGNOSIS — R1310 Dysphagia, unspecified: Secondary | ICD-10-CM

## 2021-07-04 DIAGNOSIS — K224 Dyskinesia of esophagus: Secondary | ICD-10-CM | POA: Diagnosis not present

## 2021-07-30 ENCOUNTER — Telehealth: Payer: Self-pay | Admitting: Oncology

## 2021-07-30 NOTE — Telephone Encounter (Signed)
Called patient regarding rescheduled July appointment due to provider pal, patient has been called and notified.

## 2021-07-31 ENCOUNTER — Ambulatory Visit: Payer: Medicare Other | Admitting: Neurology

## 2021-08-01 DIAGNOSIS — I10 Essential (primary) hypertension: Secondary | ICD-10-CM | POA: Diagnosis not present

## 2021-08-01 DIAGNOSIS — R1319 Other dysphagia: Secondary | ICD-10-CM | POA: Diagnosis not present

## 2021-08-01 DIAGNOSIS — G250 Essential tremor: Secondary | ICD-10-CM | POA: Diagnosis not present

## 2021-08-01 DIAGNOSIS — D649 Anemia, unspecified: Secondary | ICD-10-CM | POA: Diagnosis not present

## 2021-08-01 DIAGNOSIS — F419 Anxiety disorder, unspecified: Secondary | ICD-10-CM | POA: Diagnosis not present

## 2021-08-01 DIAGNOSIS — E7800 Pure hypercholesterolemia, unspecified: Secondary | ICD-10-CM | POA: Diagnosis not present

## 2021-08-01 DIAGNOSIS — C61 Malignant neoplasm of prostate: Secondary | ICD-10-CM | POA: Diagnosis not present

## 2021-08-01 DIAGNOSIS — R32 Unspecified urinary incontinence: Secondary | ICD-10-CM | POA: Diagnosis not present

## 2021-08-01 DIAGNOSIS — G4700 Insomnia, unspecified: Secondary | ICD-10-CM | POA: Diagnosis not present

## 2021-08-02 DIAGNOSIS — D649 Anemia, unspecified: Secondary | ICD-10-CM | POA: Diagnosis not present

## 2021-08-02 DIAGNOSIS — E7800 Pure hypercholesterolemia, unspecified: Secondary | ICD-10-CM | POA: Diagnosis not present

## 2021-08-02 NOTE — Progress Notes (Unsigned)
Assessment/Plan:    1.  Essential Tremor, L>>R  -Patient would like to continue primidone, 50 mg, 4 tablets in the morning and 1 tablet at bedtime.  We tried to increase his primidone to 250 mg, 1 in the morning and continue the 50 mg at night, but he tried it for just for short period, but he had side effects and went back down.  -Patient is not interested in DBS or focused ultrasound   2.  *** Subjective:   [name] was seen today in follow up for essential tremor.  My previous records were reviewed prior to todays visit.  Patient continues to have tremor, but tremor has been fairly stable.  He has better and worse days.  Medical records reviewed.  He has been following with urology and is scheduled July 31 for artificial urinary sphincter placement for incontinence.  Current prescribed movement disorder medications: Primidone, 50 mg, 4 tablets in the morning and 1 tablet at bed (higher dosages with side effects)  Prior medications: Primidone, 250 mg (had side effects at this dose)  ALLERGIES:   Allergies  Allergen Reactions   Tape Other (See Comments) and Rash    Surgical tape=redness/rash at site    CURRENT MEDICATIONS:  Outpatient Encounter Medications as of 08/06/2021  Medication Sig   amLODipine (NORVASC) 2.5 MG tablet Take 1 tablet (2.5 mg total) by mouth daily.   ascorbic acid (VITAMIN C) 1000 MG tablet Take 1,000 mg by mouth daily. Pt takes 5 times a week   LORazepam (ATIVAN) 0.5 MG tablet Take 0.5 mg by mouth at bedtime as needed for anxiety (restless legs).   losartan (COZAAR) 25 MG tablet Take 25 mg by mouth daily.   Multiple Vitamin (MULTIVITAMIN WITH MINERALS) TABS tablet Take 1 tablet by mouth 2 (two) times a week.    prednisoLONE 5 MG TABS tablet Take by mouth. Stops on 01/05/21   primidone (MYSOLINE) 50 MG tablet TAKE 4 TABLETS BY MOUTH IN THE MORNING 1 TABLET AT BEDTIME   rosuvastatin (CRESTOR) 10 MG tablet Take 1 tablet (10 mg total) by mouth daily.    Vibegron (GEMTESA) 75 MG TABS Take 1 tablet by mouth daily.   zolpidem (AMBIEN CR) 12.5 MG CR tablet Take 6.25 mg by mouth at bedtime.   No facility-administered encounter medications on file as of 08/06/2021.     Objective:    PHYSICAL EXAMINATION:    VITALS:   There were no vitals filed for this visit.   GEN:  The patient appears stated age and is in NAD. HEENT:  Normocephalic, atraumatic.  The mucous membranes are moist. The superficial temporal arteries are without ropiness or tenderness. CV:  RRR Lungs:  CTAB Neck/HEME:  There are no carotid bruits bilaterally.  Neurological examination:  Orientation: The patient is alert and oriented x3. Cranial nerves: There is good facial symmetry. The speech is fluent and clear. Soft palate rises symmetrically and there is no tongue deviation. Hearing is intact to conversational tone. Sensation: Sensation is intact to light touch throughout Motor: Strength is at least antigravity x4.  Movement examination: Tone: There is normal tone in the UE/LE Abnormal movements: There is just mild tremor of the outstretched hands and with intention.  It becomes more significant when he draws Archimedes spirals, especially when he first approximates the paper, left greater than right.  I have reviewed and interpreted the following labs independently   Chemistry      Component Value Date/Time   NA  138 03/13/2021 1151   NA 141 01/06/2017 0820   K 4.3 03/13/2021 1151   K 4.2 01/06/2017 0820   CL 103 03/13/2021 1151   CO2 29 03/13/2021 1151   CO2 26 01/06/2017 0820   BUN 23 03/13/2021 1151   BUN 28.9 (H) 01/06/2017 0820   CREATININE 0.90 03/13/2021 1151   CREATININE 1.1 01/06/2017 0820      Component Value Date/Time   CALCIUM 10.4 (H) 03/13/2021 1151   CALCIUM 9.7 01/06/2017 0820   ALKPHOS 66 03/13/2021 1151   ALKPHOS 41 01/06/2017 0820   AST 17 03/13/2021 1151   AST 19 01/06/2017 0820   ALT 13 03/13/2021 1151   ALT 16 01/06/2017 0820    BILITOT 0.3 03/13/2021 1151   BILITOT 0.43 01/06/2017 0820      Lab Results  Component Value Date   WBC 6.8 03/13/2021   HGB 12.7 (L) 03/13/2021   HCT 39.1 03/13/2021   MCV 88.5 03/13/2021   PLT 207 03/13/2021   No results found for: "TSH"   Chemistry      Component Value Date/Time   NA 138 03/13/2021 1151   NA 141 01/06/2017 0820   K 4.3 03/13/2021 1151   K 4.2 01/06/2017 0820   CL 103 03/13/2021 1151   CO2 29 03/13/2021 1151   CO2 26 01/06/2017 0820   BUN 23 03/13/2021 1151   BUN 28.9 (H) 01/06/2017 0820   CREATININE 0.90 03/13/2021 1151   CREATININE 1.1 01/06/2017 0820      Component Value Date/Time   CALCIUM 10.4 (H) 03/13/2021 1151   CALCIUM 9.7 01/06/2017 0820   ALKPHOS 66 03/13/2021 1151   ALKPHOS 41 01/06/2017 0820   AST 17 03/13/2021 1151   AST 19 01/06/2017 0820   ALT 13 03/13/2021 1151   ALT 16 01/06/2017 0820   BILITOT 0.3 03/13/2021 1151   BILITOT 0.43 01/06/2017 0820     Total time spent on today's visit was *** minutes, including both face-to-face time and nonface-to-face time.  Time included that spent on review of records (prior notes available to me/labs/imaging if pertinent), discussing treatment and goals, answering patient's questions and coordinating care.    Cc:  Antony Contras, MD

## 2021-08-06 ENCOUNTER — Ambulatory Visit (INDEPENDENT_AMBULATORY_CARE_PROVIDER_SITE_OTHER): Payer: Medicare Other | Admitting: Neurology

## 2021-08-06 ENCOUNTER — Encounter: Payer: Self-pay | Admitting: Neurology

## 2021-08-06 VITALS — BP 144/64 | HR 63 | Ht 71.0 in | Wt 173.0 lb

## 2021-08-06 DIAGNOSIS — G250 Essential tremor: Secondary | ICD-10-CM

## 2021-08-06 DIAGNOSIS — F419 Anxiety disorder, unspecified: Secondary | ICD-10-CM | POA: Diagnosis not present

## 2021-08-06 MED ORDER — LORAZEPAM 0.5 MG PO TABS: 0.2500 mg | ORAL_TABLET | Freq: Every day | ORAL | 4 refills | Status: DC

## 2021-08-06 NOTE — Patient Instructions (Signed)
Trial lorazepam, 1/2 pill in the daytime for tremor and see if it helps Let me know if you would like a low dose antianxiety pill/antidepressant to see if that helps We can consider trialing gabapentin in the future for tremor  The physicians and staff at [facility] are committed to providing excellent care. You may receive a survey requesting feedback about your experience at our office. We strive to receive "very good" responses to the survey questions. If you feel that your experience would prevent you from giving the office a "very good " response, please contact our office to try to remedy the situation. We may be reached at [phone]. Thank you for taking the time out of your busy day to complete the survey.

## 2021-08-12 ENCOUNTER — Telehealth: Payer: Self-pay | Admitting: Oncology

## 2021-08-12 NOTE — Telephone Encounter (Signed)
Called patient regarding upcoming July appointment, patient has been called and voicemail was left. 

## 2021-08-31 ENCOUNTER — Other Ambulatory Visit: Payer: Self-pay | Admitting: Neurology

## 2021-08-31 DIAGNOSIS — G250 Essential tremor: Secondary | ICD-10-CM

## 2021-09-02 ENCOUNTER — Other Ambulatory Visit: Payer: Self-pay

## 2021-09-02 DIAGNOSIS — G250 Essential tremor: Secondary | ICD-10-CM

## 2021-09-02 MED ORDER — PRIMIDONE 50 MG PO TABS: ORAL_TABLET | ORAL | 0 refills | Status: DC

## 2021-09-06 DIAGNOSIS — N281 Cyst of kidney, acquired: Secondary | ICD-10-CM | POA: Diagnosis not present

## 2021-09-06 DIAGNOSIS — Z8546 Personal history of malignant neoplasm of prostate: Secondary | ICD-10-CM | POA: Diagnosis not present

## 2021-09-06 DIAGNOSIS — N393 Stress incontinence (female) (male): Secondary | ICD-10-CM | POA: Diagnosis not present

## 2021-09-06 DIAGNOSIS — R82998 Other abnormal findings in urine: Secondary | ICD-10-CM | POA: Diagnosis not present

## 2021-09-06 DIAGNOSIS — N5231 Erectile dysfunction following radical prostatectomy: Secondary | ICD-10-CM | POA: Diagnosis not present

## 2021-09-06 DIAGNOSIS — R3129 Other microscopic hematuria: Secondary | ICD-10-CM | POA: Diagnosis not present

## 2021-09-10 ENCOUNTER — Other Ambulatory Visit: Payer: Medicare Other

## 2021-09-17 ENCOUNTER — Telehealth: Payer: Medicare Other | Admitting: Oncology

## 2021-09-17 ENCOUNTER — Other Ambulatory Visit: Payer: Medicare Other

## 2021-09-18 ENCOUNTER — Inpatient Hospital Stay: Payer: Medicare Other | Attending: Oncology

## 2021-09-18 ENCOUNTER — Other Ambulatory Visit: Payer: Medicare Other

## 2021-09-18 ENCOUNTER — Other Ambulatory Visit: Payer: Self-pay

## 2021-09-18 DIAGNOSIS — Z79899 Other long term (current) drug therapy: Secondary | ICD-10-CM | POA: Insufficient documentation

## 2021-09-18 DIAGNOSIS — C61 Malignant neoplasm of prostate: Secondary | ICD-10-CM | POA: Diagnosis not present

## 2021-09-18 LAB — CMP (CANCER CENTER ONLY)
ALT: 12 U/L (ref 0–44)
AST: 17 U/L (ref 15–41)
Albumin: 4.3 g/dL (ref 3.5–5.0)
Alkaline Phosphatase: 41 U/L (ref 38–126)
Anion gap: 4 — ABNORMAL LOW (ref 5–15)
BUN: 26 mg/dL — ABNORMAL HIGH (ref 8–23)
CO2: 29 mmol/L (ref 22–32)
Calcium: 9.6 mg/dL (ref 8.9–10.3)
Chloride: 105 mmol/L (ref 98–111)
Creatinine: 1.24 mg/dL (ref 0.61–1.24)
GFR, Estimated: >60 mL/min (ref >60)
Glucose, Bld: 96 mg/dL (ref 70–99)
Potassium: 4 mmol/L (ref 3.5–5.1)
Sodium: 138 mmol/L (ref 135–145)
Total Bilirubin: 0.4 mg/dL (ref 0.3–1.2)
Total Protein: 7.1 g/dL (ref 6.5–8.1)

## 2021-09-18 LAB — CBC WITH DIFFERENTIAL (CANCER CENTER ONLY)
Abs Immature Granulocytes: 0.01 10*3/uL (ref 0.00–0.07)
Basophils Absolute: 0.1 10*3/uL (ref 0.0–0.1)
Basophils Relative: 1 %
Eosinophils Absolute: 0.2 10*3/uL (ref 0.0–0.5)
Eosinophils Relative: 3 %
HCT: 37.1 % — ABNORMAL LOW (ref 39.0–52.0)
Hemoglobin: 12.8 g/dL — ABNORMAL LOW (ref 13.0–17.0)
Immature Granulocytes: 0 %
Lymphocytes Relative: 21 %
Lymphs Abs: 1.4 10*3/uL (ref 0.7–4.0)
MCH: 30.7 pg (ref 26.0–34.0)
MCHC: 34.5 g/dL (ref 30.0–36.0)
MCV: 89 fL (ref 80.0–100.0)
Monocytes Absolute: 0.7 10*3/uL (ref 0.1–1.0)
Monocytes Relative: 11 %
Neutro Abs: 4.2 10*3/uL (ref 1.7–7.7)
Neutrophils Relative %: 64 %
Platelet Count: 196 10*3/uL (ref 150–400)
RBC: 4.17 MIL/uL — ABNORMAL LOW (ref 4.22–5.81)
RDW: 13.3 % (ref 11.5–15.5)
WBC Count: 6.5 10*3/uL (ref 4.0–10.5)
nRBC: 0 % (ref 0.0–0.2)

## 2021-09-19 LAB — PROSTATE-SPECIFIC AG, SERUM (LABCORP): Prostate Specific Ag, Serum: <0.1 ng/mL (ref 0.0–4.0)

## 2021-09-23 DIAGNOSIS — Z8546 Personal history of malignant neoplasm of prostate: Secondary | ICD-10-CM | POA: Diagnosis not present

## 2021-09-23 DIAGNOSIS — N393 Stress incontinence (female) (male): Secondary | ICD-10-CM | POA: Diagnosis not present

## 2021-09-24 ENCOUNTER — Telehealth: Payer: Medicare Other | Admitting: Oncology

## 2021-09-24 DIAGNOSIS — Z8546 Personal history of malignant neoplasm of prostate: Secondary | ICD-10-CM | POA: Diagnosis not present

## 2021-09-24 DIAGNOSIS — N393 Stress incontinence (female) (male): Secondary | ICD-10-CM | POA: Diagnosis not present

## 2021-09-25 ENCOUNTER — Telehealth: Payer: Medicare Other | Admitting: Oncology

## 2021-09-26 ENCOUNTER — Inpatient Hospital Stay: Payer: Medicare Other | Attending: Oncology | Admitting: Oncology

## 2021-09-26 ENCOUNTER — Ambulatory Visit: Payer: Medicare Other | Admitting: Gastroenterology

## 2021-09-26 ENCOUNTER — Encounter: Payer: Self-pay | Admitting: *Deleted

## 2021-09-26 DIAGNOSIS — C61 Malignant neoplasm of prostate: Secondary | ICD-10-CM

## 2021-09-26 NOTE — Progress Notes (Signed)
Hematology and Oncology Follow Up for Telemedicine Visits  [name] [id number] [birth year] 76 y.o. 09/26/2021 9:23 AM [name], [name], MD   I connected with Mr. Stangelo on 09/26/21 at 10:00 AM EDT by telephone visit and verified that I am speaking with the correct person using two identifiers.   I discussed the limitations, risks, security and privacy concerns of performing an evaluation and management service by telemedicine and the availability of in-person appointments. I also discussed with the patient that there may be a patient responsible charge related to this service. The patient expressed understanding and agreed to proceed.  Other persons participating in the visit and their role in the encounter: None  Patient's location: Home Provider's location: Office    Principle Diagnosis: 76 year old man with prostate cancer diagnosed in 2012.  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   Prior Therapy: He presented with a PSA of 4.38 and subsequently underwent a radical prostatectomy and lymph node dissection on November of 2012. 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 He had one out of 12 lymph nodes involved with cancer predominantly in the right pelvic wall.    His post operative PSA remained at 0.65 and subsequently to 0.89. And in March of 2013 he was started on Lupron and have been on it since that time. His PSA remains very low  0.01.   His PSA went up to 0.3 in March 2017. He completed salvage radiation therapy completed in June 2017 under the care of Dr. Tammi Klippel. He received planned dose of 68.4 grays.  Received intermittent androgen deprivation therapy last treatment was in 2017.   Current therapy: Active surveillance only.    Interim History: Mr. Ram reports no major complaints.  He is recently recovering from a recent's  artificial sphincter surgery without any residual issues.  He denies any nausea, vomiting or abdominal pain.     Medications: Reviewed without changes. Current Outpatient Medications  Medication Sig Dispense Refill   amLODipine (NORVASC) 2.5 MG tablet Take 1 tablet (2.5 mg total) by mouth daily. 90 tablet 3   ascorbic acid (VITAMIN C) 1000 MG tablet Take 1,000 mg by mouth daily. Pt takes 5 times a week     LORazepam (ATIVAN) 0.5 MG tablet Take 0.5 tablets (0.25 mg total) by mouth daily. 15 tablet 4   losartan (COZAAR) 25 MG tablet Take 25 mg by mouth daily.     Multiple Vitamin (MULTIVITAMIN WITH MINERALS) TABS tablet Take 1 tablet by mouth 2 (two) times a week.      primidone (MYSOLINE) 50 MG tablet TAKE FOUR TABLETS BY MOUTH EVERY MORNING AND TAKE ONE TABLET BY MOUTH EVERY NIGHT AT BEDTIME 450 tablet 1   primidone (MYSOLINE) 50 MG tablet 4 tablets in the morning and 1 tablet at bed 450 tablet 0   rosuvastatin (CRESTOR) 10 MG tablet Take 1 tablet (10 mg total) by mouth daily. 90 tablet 3   Vibegron (GEMTESA) 75 MG TABS Take 1 tablet by mouth daily.     zolpidem (AMBIEN CR) 12.5 MG CR tablet Take 6.25 mg by mouth at bedtime.     No current facility-administered medications for this visit.     Allergies:  Allergies  Allergen Reactions   Tape Other (See Comments) and Rash    Surgical tape=redness/rash at site      Lab Results: Lab Results  Component Value Date  WBC 6.5 09/18/2021   HGB 12.8 (L) 09/18/2021   HCT 37.1 (L) 09/18/2021   MCV 89.0 09/18/2021   PLT 196 09/18/2021   PSA 0.29 05/09/2015     Chemistry      Component Value Date/Time   NA 138 09/18/2021 1123   NA 141 01/06/2017 0820   K 4.0 09/18/2021 1123   K 4.2 01/06/2017 0820   CL 105 09/18/2021 1123   CO2 29 09/18/2021 1123   CO2 26 01/06/2017 0820   BUN 26 (H) 09/18/2021 1123   BUN 28.9 (H) 01/06/2017 0820   CREATININE 1.24 09/18/2021 1123   CREATININE 1.1 01/06/2017 0820      Component Value  Date/Time   CALCIUM 9.6 09/18/2021 1123   CALCIUM 9.7 01/06/2017 0820   ALKPHOS 41 09/18/2021 1123   ALKPHOS 41 01/06/2017 0820   AST 17 09/18/2021 1123   AST 19 01/06/2017 0820   ALT 12 09/18/2021 1123   ALT 16 01/06/2017 0820   BILITOT 0.4 09/18/2021 1123   BILITOT 0.43 01/06/2017 0820            Impression and Plan:  76 year old man with:   1.  Prostate cancer diagnosed in 2012 without any evidence of relapse disease.  He had intermittent androgen deprivation and currently PSA is undetectable.    The natural course of this disease was reviewed and risk of relapse was assessed.  Plan is to continue with annual imaging studies which will be repeated in January 2024.  He is agreeable to this plan.   2. Androgen depravation: His PSA remains undetectable without any additional androgen deprivation.   3. Bone health: I recommended continued calcium and vitamin D supplements.    4. Follow-up: In 6 months for repeat evaluation including imaging studies.     I discussed the assessment and treatment plan with the patient. The patient was provided an opportunity to ask questions and all were answered. The patient agreed with the plan and demonstrated an understanding of the instructions.   The patient was advised to call back or seek an in-person evaluation if the symptoms worsen or if the condition fails to improve as anticipated.  I provided 20 minutes of non face-to-face telephone visit time during this encounter.  The time was spent on updating his disease status, reviewing laboratory data and future plan of care discussion.  Zola Button, MD 09/26/2021 9:23 AM

## 2021-11-11 DIAGNOSIS — Z960 Presence of urogenital implants: Secondary | ICD-10-CM | POA: Insufficient documentation

## 2021-11-13 DIAGNOSIS — N393 Stress incontinence (female) (male): Secondary | ICD-10-CM | POA: Diagnosis not present

## 2021-12-03 DIAGNOSIS — Z23 Encounter for immunization: Secondary | ICD-10-CM | POA: Diagnosis not present

## 2021-12-09 ENCOUNTER — Telehealth: Payer: Self-pay | Admitting: Internal Medicine

## 2021-12-09 DIAGNOSIS — I10 Essential (primary) hypertension: Secondary | ICD-10-CM

## 2021-12-09 DIAGNOSIS — Z79899 Other long term (current) drug therapy: Secondary | ICD-10-CM

## 2021-12-09 NOTE — Telephone Encounter (Signed)
Patient talks about his BP medication and BP going good for a long time. Then had a urethra surgery in July and needed some pain medications for a little while afterward. After the pain medication was completed the patient was using tylenol and this was not enough for his pain. He has now been on Advil for a lease 6 -8 weeks as advised by urology and he is still having pain but that is getting better. He noticed his BP is elevated 168/82 this morning and has been in the 160's over 80's for the last three days at least, maybe longer. (Just started checking again)   Patient concerned the Advil could be the cause. Noted Hypertension was reported in less than 1% of patients treated with oral ibuprofen in controlled, clinical trials.  No missed doses of medications. Gave patient ED precautions. Verbalized understanding and agreement.  Will forward to MD for advisement.

## 2021-12-09 NOTE — Telephone Encounter (Signed)
Patient has some questions he would like to ask Dr. Harrington Challenger or nurse about medication and BP

## 2021-12-10 DIAGNOSIS — Z23 Encounter for immunization: Secondary | ICD-10-CM | POA: Diagnosis not present

## 2021-12-11 DIAGNOSIS — Z23 Encounter for immunization: Secondary | ICD-10-CM | POA: Diagnosis not present

## 2021-12-11 NOTE — Telephone Encounter (Signed)
NSA[facility] can do that    I would recomm stopping  Needs BMET and CBC  Confirm meds he is taking       Wil lcall with changes  He should ult get seen in clinic   It will be 1 year in Jan

## 2021-12-11 NOTE — Telephone Encounter (Signed)
Pt advised and will send me a My Chart when he can come back in for labs.... he has follow up 02/03/22 with Dr Harrington Challenger.

## 2021-12-18 NOTE — Addendum Note (Signed)
Addended by: Stephani Police on: 12/18/2021 11:24 AM   Modules accepted: Orders

## 2021-12-19 ENCOUNTER — Ambulatory Visit: Payer: Medicare Other | Attending: Internal Medicine

## 2021-12-19 DIAGNOSIS — Z79899 Other long term (current) drug therapy: Secondary | ICD-10-CM | POA: Diagnosis not present

## 2021-12-19 DIAGNOSIS — I10 Essential (primary) hypertension: Secondary | ICD-10-CM

## 2021-12-20 LAB — CBC
Hematocrit: 39.4 % (ref 37.5–51.0)
Hemoglobin: 13 g/dL (ref 13.0–17.7)
MCH: 29.8 pg (ref 26.6–33.0)
MCHC: 33 g/dL (ref 31.5–35.7)
MCV: 90 fL (ref 79–97)
Platelets: 174 10*3/uL (ref 150–450)
RBC: 4.36 x10E6/uL (ref 4.14–5.80)
RDW: 13 % (ref 11.6–15.4)
WBC: 5.6 10*3/uL (ref 3.4–10.8)

## 2021-12-20 LAB — BASIC METABOLIC PANEL
BUN/Creatinine Ratio: 24 (ref 10–24)
BUN: 21 mg/dL (ref 8–27)
CO2: 27 mmol/L (ref 20–29)
Calcium: 10.5 mg/dL — ABNORMAL HIGH (ref 8.6–10.2)
Chloride: 102 mmol/L (ref 96–106)
Creatinine, Ser: 0.89 mg/dL (ref 0.76–1.27)
Glucose: 82 mg/dL (ref 70–99)
Potassium: 4.7 mmol/L (ref 3.5–5.2)
Sodium: 141 mmol/L (ref 134–144)
eGFR: 89 mL/min/{1.73_m2} (ref >59)

## 2021-12-23 DIAGNOSIS — M19011 Primary osteoarthritis, right shoulder: Secondary | ICD-10-CM | POA: Diagnosis not present

## 2021-12-26 DIAGNOSIS — N5231 Erectile dysfunction following radical prostatectomy: Secondary | ICD-10-CM | POA: Diagnosis not present

## 2021-12-26 DIAGNOSIS — Z960 Presence of urogenital implants: Secondary | ICD-10-CM | POA: Diagnosis not present

## 2021-12-26 DIAGNOSIS — N393 Stress incontinence (female) (male): Secondary | ICD-10-CM | POA: Diagnosis not present

## 2021-12-26 DIAGNOSIS — C61 Malignant neoplasm of prostate: Secondary | ICD-10-CM | POA: Diagnosis not present

## 2021-12-26 DIAGNOSIS — N281 Cyst of kidney, acquired: Secondary | ICD-10-CM | POA: Diagnosis not present

## 2022-01-01 DIAGNOSIS — L570 Actinic keratosis: Secondary | ICD-10-CM | POA: Diagnosis not present

## 2022-01-01 DIAGNOSIS — I788 Other diseases of capillaries: Secondary | ICD-10-CM | POA: Diagnosis not present

## 2022-01-01 DIAGNOSIS — Z85828 Personal history of other malignant neoplasm of skin: Secondary | ICD-10-CM | POA: Diagnosis not present

## 2022-01-01 DIAGNOSIS — C61 Malignant neoplasm of prostate: Secondary | ICD-10-CM | POA: Diagnosis not present

## 2022-01-01 DIAGNOSIS — L812 Freckles: Secondary | ICD-10-CM | POA: Diagnosis not present

## 2022-01-01 DIAGNOSIS — L821 Other seborrheic keratosis: Secondary | ICD-10-CM | POA: Diagnosis not present

## 2022-01-01 DIAGNOSIS — D1801 Hemangioma of skin and subcutaneous tissue: Secondary | ICD-10-CM | POA: Diagnosis not present

## 2022-01-06 ENCOUNTER — Telehealth: Payer: Self-pay | Admitting: Neurology

## 2022-01-06 NOTE — Telephone Encounter (Signed)
Patient called and said his doctor Dr. Eveline Keto, urologist recommended a pudendal nerve block and he'd like to know if Dr. Carles Collet can do that nerve block.  He's been referred to a nerve doctor at [facility] but her rather get Dr. Doristine Devoid advice about this and see if she recommends someone in [place] or if she can do it.  Patient reports significant pain and discomfort.

## 2022-01-06 NOTE — Telephone Encounter (Signed)
Called patient and informed him that Dr. Carles Collet does not do those and does not have any recommendations and stated he may just have [facility] do them. Patient thanked me for the call and had no further questions or concerns.

## 2022-01-09 DIAGNOSIS — G8918 Other acute postprocedural pain: Secondary | ICD-10-CM | POA: Diagnosis not present

## 2022-01-09 DIAGNOSIS — R102 Pelvic and perineal pain: Secondary | ICD-10-CM | POA: Diagnosis not present

## 2022-01-22 DIAGNOSIS — K6289 Other specified diseases of anus and rectum: Secondary | ICD-10-CM | POA: Diagnosis not present

## 2022-01-22 DIAGNOSIS — C61 Malignant neoplasm of prostate: Secondary | ICD-10-CM | POA: Diagnosis not present

## 2022-01-22 DIAGNOSIS — Z960 Presence of urogenital implants: Secondary | ICD-10-CM | POA: Diagnosis not present

## 2022-01-22 DIAGNOSIS — N5231 Erectile dysfunction following radical prostatectomy: Secondary | ICD-10-CM | POA: Diagnosis not present

## 2022-01-28 ENCOUNTER — Telehealth: Payer: Self-pay | Admitting: Oncology

## 2022-01-28 NOTE — Telephone Encounter (Signed)
Called patient per shadad transition. Patient r/s and notified.

## 2022-01-30 DIAGNOSIS — R102 Pelvic and perineal pain: Secondary | ICD-10-CM | POA: Diagnosis not present

## 2022-01-30 DIAGNOSIS — Z8546 Personal history of malignant neoplasm of prostate: Secondary | ICD-10-CM | POA: Diagnosis not present

## 2022-01-31 DIAGNOSIS — N281 Cyst of kidney, acquired: Secondary | ICD-10-CM | POA: Diagnosis not present

## 2022-01-31 DIAGNOSIS — N5231 Erectile dysfunction following radical prostatectomy: Secondary | ICD-10-CM | POA: Diagnosis not present

## 2022-01-31 DIAGNOSIS — N393 Stress incontinence (female) (male): Secondary | ICD-10-CM | POA: Diagnosis not present

## 2022-01-31 DIAGNOSIS — Z8546 Personal history of malignant neoplasm of prostate: Secondary | ICD-10-CM | POA: Diagnosis not present

## 2022-01-31 DIAGNOSIS — R102 Pelvic and perineal pain: Secondary | ICD-10-CM | POA: Diagnosis not present

## 2022-02-02 NOTE — Progress Notes (Unsigned)
Cardiology Office Note   Date:  02/03/2022   ID:  [name], [name] 1945-11-09, MRN [id number]  PCP:  Antony Contras, MD  Cardiologist:   Dorris Carnes, MD   F/U of HTN     History of Present Illness: [name] is a 76 y.o. male with a history of HTN, chest pain, hyperlipidemia  Also a hx of CAD on CT scans .The pt is also followed in urology for incontinence Hx of syncope in past with meds  since D/C'd   I saw the pt in Oct 2022   Since seen he denies CP   Breathing is OK   No dizziness His only complaint is severe pain in the perineal area.    Has seen urology   Also [facility]al read[facility] at [facility]   Current Meds  Medication Sig   amLODipine (NORVASC) 2.5 MG tablet Take 1 tablet (2.5 mg total) by mouth daily.   ascorbic acid (VITAMIN C) 1000 MG tablet Take 1,000 mg by mouth daily. Pt takes 5 times a week   LORazepam (ATIVAN) 0.5 MG tablet Take 0.5 tablets (0.25 mg total) by mouth daily.   losartan (COZAAR) 25 MG tablet Take 25 mg by mouth daily.   Multiple Vitamin (MULTIVITAMIN WITH MINERALS) TABS tablet Take 1 tablet by mouth 2 (two) times a week.    primidone (MYSOLINE) 50 MG tablet TAKE FOUR TABLETS BY MOUTH EVERY MORNING AND TAKE ONE TABLET BY MOUTH EVERY NIGHT AT BEDTIME   Probiotic CHEW Chew by mouth. 1 chewable daily   rosuvastatin (CRESTOR) 10 MG tablet Take 1 tablet (10 mg total) by mouth daily.   zolpidem (AMBIEN CR) 12.5 MG CR tablet Take 6.25 mg by mouth at bedtime.     Allergies:   Imipramine, Lisinopril-hydrochlorothiazide, Meloxicam, and Tape   Past Medical History:  Diagnosis Date   Arthritis    hands    Cancer ([place])    prostate cancer    Hyperlipidemia    Hypertension    Prostate cancer ([place])    Recurrent upper respiratory infection (URI)    pt had a cold recent- week ago - better now    Tremor     Past Surgical History:  Procedure Laterality Date   CATARACT EXTRACTION Bilateral    LUMBAR LAMINECTOMY/DECOMPRESSION MICRODISCECTOMY Left  04/01/2018   Procedure: Left Lumbar four-lumbar five Microdiscectomy;  Surgeon: Erline Levine, MD;  Location: [facility];  Service: Neurosurgery;  Laterality: Left;   OTHER SURGICAL HISTORY  2005   right hand middle finger surgery    REPAIR OF CEREBROSPINAL FLUID LEAK N/A 05/18/2018   Procedure: Exploration of Lumbar four-five with repair of cerebrospinal fluid leak;  Surgeon: Erline Levine, MD;  Location: [place] [facility];  Service: Neurosurgery;  Laterality: N/A;   ROBOT ASSISTED LAPAROSCOPIC RADICAL PROSTATECTOMY  01/13/2011   Procedure: ROBOTIC ASSISTED LAPAROSCOPIC RADICAL PROSTATECTOMY LEVEL 2;  Surgeon: Dutch Gray, MD;  Location: [facility];  Service: Urology;  Laterality: Bilateral;  Robotic Assisted Laparoscopic Prostatetectomy with Bilateral Lymphadenectomy  Level 2     Social History:  The patient  reports that he has never smoked. He has never used smokeless tobacco. He reports that he does not currently use alcohol. He reports that he does not use drugs.   Family History:  The patient's family history includes Cancer in his mother; Healthy in his child; Heart attack in his father; Hypertension in his father and sister.    ROS:  Please see the history of present illness. All other  systems are reviewed and  Negative to the above problem except as noted.    PHYSICAL EXAM: VS:  BP 132/74   Pulse 65   Ht '5\' 11"'$  (1.803 m)   Wt 176 lb 6.4 oz (80 kg)   SpO2 94%   BMI 24.60 kg/m   GEN Pt is in no acute distress  HEENT: normal  Neck: no JVD, carotid bruits Cardiac: RRR; no murmurs,   No  LE dema  Respiratory:  clear to auscultation bilaterally, GI: soft, nontender, nondistended, + BS  No hepatomegaly  MS: no deformity Moving all extremities   Skin: warm and dry, no rash Neuro:  Strength and sensation are intact Psych: euthymic mood, full affect   EKG:  EKG is not ordered today.   NSR 65 bpm  Lipid Panel    Component Value Date/Time   CHOL 121 07/13/2019 1020   TRIG 110 07/13/2019 1020   HDL  41 07/13/2019 1020   CHOLHDL 3.0 07/13/2019 1020   CHOLHDL 2.9 05/25/2015 0857   VLDL 15 05/25/2015 0857   LDLCALC 60 07/13/2019 1020      Wt Readings from Last 3 Encounters:  02/03/22 176 lb 6.4 oz (80 kg)  08/06/21 173 lb (78.5 kg)  03/20/21 179 lb 4.8 oz (81.3 kg)      ASSESSMENT AND PLAN:  1  HTN  BP is controlled oncurrent regimen   Would continue     2  HL LDL is 57  HDL is 41  Trig 161   Continue current regimen  3.  CAD.  CAD on CT scan   Patinet remains asymptomatic  Continue to follow.   Will plan follow up next summer   I will refer to pain specialist at [facility] for help with pain  Current medicines are reviewed at length with the patient today.  The patient does not have concerns regarding medicines.  Signed, Dorris Carnes, MD  02/03/2022 11:18 PM    [facility] [address], [place], [place]  [address] Phone: [phone]; Fax: [phone]

## 2022-02-03 ENCOUNTER — Encounter: Payer: Self-pay | Admitting: Internal Medicine

## 2022-02-03 ENCOUNTER — Ambulatory Visit: Payer: Medicare Other | Attending: Internal Medicine | Admitting: Internal Medicine

## 2022-02-03 VITALS — BP 132/74 | HR 65 | Ht 71.0 in | Wt 176.4 lb

## 2022-02-03 DIAGNOSIS — I10 Essential (primary) hypertension: Secondary | ICD-10-CM | POA: Diagnosis not present

## 2022-02-03 NOTE — Patient Instructions (Addendum)
Medication Instructions:   *If you need a refill on your cardiac medications before your next appointment, please call your pharmacy*   Lab Work:  If you have labs (blood work) drawn today and your tests are completely normal, you will receive your results only by: [facility] (if you have MyChart) OR A paper copy in the mail If you have any lab test that is abnormal or we need to change your treatment, we will call you to review the results.   Testing/Procedures:    Follow-Up: At [facility], you and your health needs are our priority.  As part of our continuing mission to provide you with exceptional heart care, we have created designated Provider Care Teams.  These Care Teams include your primary Cardiologist (physician) and Advanced Practice Providers (APPs -  Physician Assistants and Nurse Practitioners) who all work together to provide you with the care you need, when you need it.  We recommend signing up for the patient portal called "MyChart".  Sign up information is provided on this After Visit Summary.  MyChart is used to connect with patients for Virtual Visits (Telemedicine).  Patients are able to view lab/test results, encounter notes, upcoming appointments, etc.  Non-urgent messages can be sent to your provider as well.   To learn more about what you can do with MyChart, go to [email or web address].    Your next appointment:   9 month(s)  The format for your next appointment:   In Person  Provider:   Dorris Carnes, MD     Other Instructions  Referral to Dr Blair Hailey [phone]  Important Information About Sugar

## 2022-02-05 DIAGNOSIS — Z6825 Body mass index (BMI) 25.0-25.9, adult: Secondary | ICD-10-CM | POA: Diagnosis not present

## 2022-02-05 DIAGNOSIS — G588 Other specified mononeuropathies: Secondary | ICD-10-CM | POA: Diagnosis not present

## 2022-02-05 DIAGNOSIS — N3946 Mixed incontinence: Secondary | ICD-10-CM | POA: Diagnosis not present

## 2022-02-13 NOTE — Progress Notes (Signed)
Assessment/Plan:    1.  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  In the past, the patient tried to increase the dose but had side effects.  Lately, he has been trying to go back up and seems to be tolerating it.  He states that he is going to continue to try and will let me know.  If so, he will call me and we will call him and the 250 mg dose in the morning and he will continue to 50 mg at bedtime.    -Patient knows he has very few options short of DBS surgery or focused ultrasound and he isn't interested in that 2.  Severe scrotal/perineal pain  -Developed after a surgery for an artificial urinary sphincter.  Patient is following with urology.  Had hypogastric nerve ablation done by interventional radiology but it made it worse and now following with [name]. Subjective:   [name] was seen today in follow up for essential tremor.  My previous records were reviewed prior to todays visit.  Last visit, patient was trying to push up the dose of primidone.  He did not tolerate higher dosages in the past.  He told me he would call me if he needed the prescription for the higher dose.  I did not hear from him but he states that a few weeks ago he started taking 4.5 tabs in the AM and is tolerating it and .  He did start on just a half a tablet of Ativan (0.25 mg) daily and states that he is not noting a lot of help for tremor but uses it for sleep.  Patient has seen urology since last visit.  Notes are reviewed.  Patient apparently had artificial urinary sphincter placement at the end of July.  After that, he had developed severe scrotal pain.  He ended up calling us regarding this pain and a pudendal nerve block was recommended by urology.  He called me and asked me if we did those, but I told him that we did not.  He has had hypogastric nerve ablation done by interventional radiology per records.  Pt states  that the injection made it worse and is now following with [name].  He is now on gabapentin as well.  He is taking '300mg'$ , 2 in the AM, 1 in the PM.    Current prescribed movement disorder medications: Primidone, 50 mg, 4 tablets in the morning and 1 tablet at bed (currently doing 4.5 in the AM and 1 at bed) Ativan 0.5 mg, half tablet daily  Prior medications: Primidone, 250 mg (had side effects at this dose)  ALLERGIES:   Allergies  Allergen Reactions   Imipramine Other (See Comments)   Lisinopril-Hydrochlorothiazide     Other reaction(s): cough?   Meloxicam     Other reaction(s): rash?... Able to tolerate Aleve without problems   Tape Other (See Comments) and Rash    Surgical tape=redness/rash at site    CURRENT MEDICATIONS:  Outpatient Encounter Medications as of 02/25/2022  Medication Sig   ascorbic acid (VITAMIN C) 1000 MG tablet Take 1,000 mg by mouth daily. Pt takes 5 times a week   LORazepam (ATIVAN) 0.5 MG tablet Take 0.5 tablets (0.25 mg total) by mouth daily.   losartan (COZAAR) 25 MG tablet Take 25 mg by mouth daily.   Multiple Vitamin (MULTIVITAMIN WITH MINERALS) TABS tablet Take 1 tablet  by mouth 2 (two) times a week.    NEURONTIN 300 MG capsule Take 300 mg by mouth 3 (three) times daily.   primidone (MYSOLINE) 50 MG tablet TAKE FOUR TABLETS BY MOUTH EVERY MORNING AND TAKE ONE TABLET BY MOUTH EVERY NIGHT AT BEDTIME   Probiotic CHEW Chew by mouth. 1 chewable daily   rosuvastatin (CRESTOR) 10 MG tablet Take 1 tablet (10 mg total) by mouth daily.   zolpidem (AMBIEN CR) 12.5 MG CR tablet Take 6.25 mg by mouth at bedtime.   amLODipine (NORVASC) 2.5 MG tablet Take 1 tablet (2.5 mg total) by mouth daily.   No facility-administered encounter medications on file as of 02/25/2022.     Objective:    PHYSICAL EXAMINATION:    VITALS:   Vitals:   02/25/22 1301  BP: (!) 162/80  Pulse: 62  SpO2: 98%  Weight: 180 lb 12.8 oz (82 kg)  Height: '5\' 11"'$  (1.803 m)       GEN:  The patient appears stated age and is in NAD. HEENT:  Normocephalic, atraumatic.  The mucous membranes are moist. The superficial temporal arteries are without ropiness or tenderness. CV:  RRR Lungs:  CTAB Neck/HEME:  There are no carotid bruits bilaterally.  Neurological examination:  Orientation: The patient is alert and oriented x3. Cranial nerves: There is good facial symmetry. The speech is fluent and clear. Soft palate rises symmetrically and there is no tongue deviation. Hearing is intact to conversational tone. Sensation: Sensation is intact to light touch throughout Motor: Strength is at least antigravity x4.  Movement examination: Tone: There is normal tone in the UE/LE Abnormal movements: There is just mild tremor of the outstretched hands and with intention.  It becomes more significant when he draws Archimedes spirals, especially when he first approximates the paper, left greater than right.  This is the same as several previous visits.  I have reviewed and interpreted the following labs independently   Chemistry      Component Value Date/Time   NA 141 12/19/2021 1035   NA 141 01/06/2017 0820   K 4.7 12/19/2021 1035   K 4.2 01/06/2017 0820   CL 102 12/19/2021 1035   CO2 27 12/19/2021 1035   CO2 26 01/06/2017 0820   BUN 21 12/19/2021 1035   BUN 28.9 (H) 01/06/2017 0820   CREATININE 0.89 12/19/2021 1035   CREATININE 1.24 09/18/2021 1123   CREATININE 1.1 01/06/2017 0820      Component Value Date/Time   CALCIUM 10.5 (H) 12/19/2021 1035   CALCIUM 9.7 01/06/2017 0820   ALKPHOS 41 09/18/2021 1123   ALKPHOS 41 01/06/2017 0820   AST 17 09/18/2021 1123   AST 19 01/06/2017 0820   ALT 12 09/18/2021 1123   ALT 16 01/06/2017 0820   BILITOT 0.4 09/18/2021 1123   BILITOT 0.43 01/06/2017 0820      Lab Results  Component Value Date   WBC 5.6 12/19/2021   HGB 13.0 12/19/2021   HCT 39.4 12/19/2021   MCV 90 12/19/2021   PLT 174 12/19/2021   No results  found for: "TSH"   Chemistry      Component Value Date/Time   NA 141 12/19/2021 1035   NA 141 01/06/2017 0820   K 4.7 12/19/2021 1035   K 4.2 01/06/2017 0820   CL 102 12/19/2021 1035   CO2 27 12/19/2021 1035   CO2 26 01/06/2017 0820   BUN 21 12/19/2021 1035   BUN 28.9 (H) 01/06/2017 0820   CREATININE 0.89 12/19/2021  1035   CREATININE 1.24 09/18/2021 1123   CREATININE 1.1 01/06/2017 0820      Component Value Date/Time   CALCIUM 10.5 (H) 12/19/2021 1035   CALCIUM 9.7 01/06/2017 0820   ALKPHOS 41 09/18/2021 1123   ALKPHOS 41 01/06/2017 0820   AST 17 09/18/2021 1123   AST 19 01/06/2017 0820   ALT 12 09/18/2021 1123   ALT 16 01/06/2017 0820   BILITOT 0.4 09/18/2021 1123   BILITOT 0.43 01/06/2017 0820         Cc:  Antony Contras, MD

## 2022-02-25 ENCOUNTER — Encounter: Payer: Self-pay | Admitting: Neurology

## 2022-02-25 ENCOUNTER — Ambulatory Visit (INDEPENDENT_AMBULATORY_CARE_PROVIDER_SITE_OTHER): Payer: Medicare Other | Admitting: Neurology

## 2022-02-25 VITALS — BP 162/80 | HR 62 | Ht 71.0 in | Wt 180.8 lb

## 2022-02-25 DIAGNOSIS — N5082 Scrotal pain: Secondary | ICD-10-CM | POA: Diagnosis not present

## 2022-02-25 DIAGNOSIS — G250 Essential tremor: Secondary | ICD-10-CM

## 2022-02-25 MED ORDER — PRIMIDONE 50 MG PO TABS: ORAL_TABLET | ORAL | 2 refills | Status: DC

## 2022-02-25 NOTE — Patient Instructions (Signed)
It was good to see you!  The physicians and staff at [facility] are committed to providing excellent care. You may receive a survey requesting feedback about your experience at our office. We strive to receive "very good" responses to the survey questions. If you feel that your experience would prevent you from giving the office a "very good " response, please contact our office to try to remedy the situation. We may be reached at [phone]. Thank you for taking the time out of your busy day to complete the survey.

## 2022-02-26 DIAGNOSIS — G588 Other specified mononeuropathies: Secondary | ICD-10-CM | POA: Diagnosis not present

## 2022-02-27 DIAGNOSIS — C61 Malignant neoplasm of prostate: Secondary | ICD-10-CM | POA: Diagnosis not present

## 2022-02-27 DIAGNOSIS — R102 Pelvic and perineal pain: Secondary | ICD-10-CM | POA: Diagnosis not present

## 2022-03-03 ENCOUNTER — Telehealth: Payer: Self-pay | Admitting: Internal Medicine

## 2022-03-03 NOTE — Telephone Encounter (Signed)
Addition to previous note:  Patient stated he was recently prescribed Gabapentin (about 5 weeks).

## 2022-03-03 NOTE — Telephone Encounter (Signed)
Pt called to report that his BP has been rising over the last few days:   1/6 - 136/72          144/93          145/77 1/8 - 169/98          173/85  At Dr Laqueta Linden office today it was 143/67... he is worried that the Gabapentin that he started 5 weeks is causing this change... he has been feeling well... no headache, dizziness, chest pain.   He is asking to set up a nurse visit where he can bring his cuff to compare... he is also waiting for his PCP to call him back re: the gabapentin and if he is going to make any changes based on the BP reading at had there at his office with his nurse.   I have asked him to continue to monitor and keep his log.   I will follow up with him after Dr Harrington Challenger reviews.

## 2022-03-03 NOTE — Telephone Encounter (Signed)
Pt c/o BP issue: STAT if pt c/o blurred vision, one-sided weakness or slurred speech  1. What are your last 5 BP readings?   1/6 - 136/72          144/93          145/77 1/8 - 169/98          173/85  2. Are you having any other symptoms (ex. Dizziness, headache, blurred vision, passed out)?   No  3. What is your BP issue?   Patient stated he is concerned his BP readings have been running high.

## 2022-03-07 DIAGNOSIS — Z0000 Encounter for general adult medical examination without abnormal findings: Secondary | ICD-10-CM | POA: Diagnosis not present

## 2022-03-07 DIAGNOSIS — Z1331 Encounter for screening for depression: Secondary | ICD-10-CM | POA: Diagnosis not present

## 2022-03-07 DIAGNOSIS — C61 Malignant neoplasm of prostate: Secondary | ICD-10-CM | POA: Diagnosis not present

## 2022-03-07 DIAGNOSIS — R102 Pelvic and perineal pain: Secondary | ICD-10-CM | POA: Diagnosis not present

## 2022-03-07 DIAGNOSIS — F419 Anxiety disorder, unspecified: Secondary | ICD-10-CM | POA: Diagnosis not present

## 2022-03-07 DIAGNOSIS — G4700 Insomnia, unspecified: Secondary | ICD-10-CM | POA: Diagnosis not present

## 2022-03-07 DIAGNOSIS — I10 Essential (primary) hypertension: Secondary | ICD-10-CM | POA: Diagnosis not present

## 2022-03-07 DIAGNOSIS — G250 Essential tremor: Secondary | ICD-10-CM | POA: Diagnosis not present

## 2022-03-07 DIAGNOSIS — Z23 Encounter for immunization: Secondary | ICD-10-CM | POA: Diagnosis not present

## 2022-03-07 DIAGNOSIS — E7800 Pure hypercholesterolemia, unspecified: Secondary | ICD-10-CM | POA: Diagnosis not present

## 2022-03-07 DIAGNOSIS — R1319 Other dysphagia: Secondary | ICD-10-CM | POA: Diagnosis not present

## 2022-03-07 DIAGNOSIS — D649 Anemia, unspecified: Secondary | ICD-10-CM | POA: Diagnosis not present

## 2022-03-11 NOTE — Telephone Encounter (Signed)
Per Dr Harrington Challenger:  Please set up for a nurses visit.  Bring in cuff and log.   I spoke with the pt and he has his annual medicare assessment with Dr Moreen Fowler and he had his Losartan increased to 50 mg daily.. he has not checked his B in a few days due to being busy but he will start checking it again and keep a log and if no improvement will call and make the appt for his nurse visit here at our office... pt says he has been feeling well.

## 2022-03-18 DIAGNOSIS — R102 Pelvic and perineal pain: Secondary | ICD-10-CM | POA: Diagnosis not present

## 2022-03-18 DIAGNOSIS — N393 Stress incontinence (female) (male): Secondary | ICD-10-CM | POA: Diagnosis not present

## 2022-03-20 ENCOUNTER — Ambulatory Visit (HOSPITAL_COMMUNITY)
Admission: RE | Admit: 2022-03-20 | Discharge: 2022-03-20 | Disposition: A | Discharge status: Home / Self Care | Payer: Medicare Other | Source: Ambulatory Visit | Attending: Oncology | Admitting: Oncology

## 2022-03-20 ENCOUNTER — Encounter (HOSPITAL_COMMUNITY): Payer: Self-pay

## 2022-03-20 ENCOUNTER — Inpatient Hospital Stay: Payer: Medicare Other | Attending: Oncology

## 2022-03-20 ENCOUNTER — Other Ambulatory Visit: Payer: Self-pay

## 2022-03-20 DIAGNOSIS — Z9079 Acquired absence of other genital organ(s): Secondary | ICD-10-CM | POA: Diagnosis not present

## 2022-03-20 DIAGNOSIS — I7121 Aneurysm of the ascending aorta, without rupture: Secondary | ICD-10-CM | POA: Insufficient documentation

## 2022-03-20 DIAGNOSIS — I700 Atherosclerosis of aorta: Secondary | ICD-10-CM | POA: Insufficient documentation

## 2022-03-20 DIAGNOSIS — C61 Malignant neoplasm of prostate: Secondary | ICD-10-CM

## 2022-03-20 DIAGNOSIS — M47816 Spondylosis without myelopathy or radiculopathy, lumbar region: Secondary | ICD-10-CM | POA: Insufficient documentation

## 2022-03-20 DIAGNOSIS — I712 Thoracic aortic aneurysm, without rupture, unspecified: Secondary | ICD-10-CM | POA: Diagnosis not present

## 2022-03-20 DIAGNOSIS — Z79899 Other long term (current) drug therapy: Secondary | ICD-10-CM | POA: Insufficient documentation

## 2022-03-20 DIAGNOSIS — I359 Nonrheumatic aortic valve disorder, unspecified: Secondary | ICD-10-CM | POA: Diagnosis not present

## 2022-03-20 DIAGNOSIS — J984 Other disorders of lung: Secondary | ICD-10-CM | POA: Diagnosis not present

## 2022-03-20 DIAGNOSIS — K769 Liver disease, unspecified: Secondary | ICD-10-CM | POA: Diagnosis not present

## 2022-03-20 DIAGNOSIS — M5136 Other intervertebral disc degeneration, lumbar region: Secondary | ICD-10-CM | POA: Insufficient documentation

## 2022-03-20 DIAGNOSIS — Z923 Personal history of irradiation: Secondary | ICD-10-CM | POA: Diagnosis not present

## 2022-03-20 LAB — CMP (CANCER CENTER ONLY)
ALT: 10 U/L (ref 0–44)
AST: 16 U/L (ref 15–41)
Albumin: 4.1 g/dL (ref 3.5–5.0)
Alkaline Phosphatase: 50 U/L (ref 38–126)
Anion gap: 4 — ABNORMAL LOW (ref 5–15)
BUN: 22 mg/dL (ref 8–23)
CO2: 30 mmol/L (ref 22–32)
Calcium: 9.9 mg/dL (ref 8.9–10.3)
Chloride: 106 mmol/L (ref 98–111)
Creatinine: 0.87 mg/dL (ref 0.61–1.24)
GFR, Estimated: >60 mL/min (ref >60)
Glucose, Bld: 88 mg/dL (ref 70–99)
Potassium: 4.2 mmol/L (ref 3.5–5.1)
Sodium: 140 mmol/L (ref 135–145)
Total Bilirubin: 0.4 mg/dL (ref 0.3–1.2)
Total Protein: 7.2 g/dL (ref 6.5–8.1)

## 2022-03-20 LAB — CBC WITH DIFFERENTIAL (CANCER CENTER ONLY)
Abs Immature Granulocytes: 0.02 10*3/uL (ref 0.00–0.07)
Basophils Absolute: 0.1 10*3/uL (ref 0.0–0.1)
Basophils Relative: 2 %
Eosinophils Absolute: 0.2 10*3/uL (ref 0.0–0.5)
Eosinophils Relative: 4 %
HCT: 37 % — ABNORMAL LOW (ref 39.0–52.0)
Hemoglobin: 12.4 g/dL — ABNORMAL LOW (ref 13.0–17.0)
Immature Granulocytes: 0 %
Lymphocytes Relative: 23 %
Lymphs Abs: 1.1 10*3/uL (ref 0.7–4.0)
MCH: 30.7 pg (ref 26.0–34.0)
MCHC: 33.5 g/dL (ref 30.0–36.0)
MCV: 91.6 fL (ref 80.0–100.0)
Monocytes Absolute: 0.5 10*3/uL (ref 0.1–1.0)
Monocytes Relative: 10 %
Neutro Abs: 2.9 10*3/uL (ref 1.7–7.7)
Neutrophils Relative %: 61 %
Platelet Count: 205 10*3/uL (ref 150–400)
RBC: 4.04 MIL/uL — ABNORMAL LOW (ref 4.22–5.81)
RDW: 13 % (ref 11.5–15.5)
WBC Count: 4.7 10*3/uL (ref 4.0–10.5)
nRBC: 0 % (ref 0.0–0.2)

## 2022-03-20 MED ORDER — SODIUM CHLORIDE (PF) 0.9 % IJ SOLN
INTRAMUSCULAR | Status: AC
  Filled 2022-03-20: qty 50

## 2022-03-20 MED ORDER — IOHEXOL 300 MG/ML  SOLN
100.0000 mL | Freq: Once | INTRAMUSCULAR | Status: AC | PRN
  Administered 2022-03-20: 100 mL via INTRAVENOUS

## 2022-03-22 LAB — PROSTATE-SPECIFIC AG, SERUM (LABCORP): Prostate Specific Ag, Serum: <0.1 ng/mL (ref 0.0–4.0)

## 2022-03-27 ENCOUNTER — Inpatient Hospital Stay: Payer: Medicare Other | Attending: Hematology and Oncology | Admitting: Hematology and Oncology

## 2022-03-27 ENCOUNTER — Telehealth: Payer: Medicare Other | Admitting: Oncology

## 2022-03-27 ENCOUNTER — Inpatient Hospital Stay: Payer: Medicare Other | Admitting: Hematology and Oncology

## 2022-03-27 ENCOUNTER — Other Ambulatory Visit: Payer: Self-pay

## 2022-03-27 ENCOUNTER — Telehealth: Payer: Self-pay | Admitting: Hematology and Oncology

## 2022-03-27 VITALS — BP 151/72 | HR 58 | Temp 97.3°F | Resp 20 | Wt 175.7 lb

## 2022-03-27 DIAGNOSIS — Z8249 Family history of ischemic heart disease and other diseases of the circulatory system: Secondary | ICD-10-CM | POA: Diagnosis not present

## 2022-03-27 DIAGNOSIS — C61 Malignant neoplasm of prostate: Secondary | ICD-10-CM | POA: Insufficient documentation

## 2022-03-27 DIAGNOSIS — Z923 Personal history of irradiation: Secondary | ICD-10-CM | POA: Insufficient documentation

## 2022-03-27 DIAGNOSIS — Z8049 Family history of malignant neoplasm of other genital organs: Secondary | ICD-10-CM | POA: Diagnosis not present

## 2022-03-27 DIAGNOSIS — Z79899 Other long term (current) drug therapy: Secondary | ICD-10-CM | POA: Diagnosis not present

## 2022-03-27 DIAGNOSIS — I700 Atherosclerosis of aorta: Secondary | ICD-10-CM | POA: Insufficient documentation

## 2022-03-27 DIAGNOSIS — I10 Essential (primary) hypertension: Secondary | ICD-10-CM | POA: Diagnosis not present

## 2022-03-27 DIAGNOSIS — Z888 Allergy status to other drugs, medicaments and biological substances status: Secondary | ICD-10-CM | POA: Diagnosis not present

## 2022-03-27 DIAGNOSIS — Z9079 Acquired absence of other genital organ(s): Secondary | ICD-10-CM | POA: Diagnosis not present

## 2022-03-27 NOTE — Telephone Encounter (Signed)
Called patient per 1/31 secure chat to reschedule 2/1 phone visit for in person visit. Patient rescheduled and notified.

## 2022-03-27 NOTE — Progress Notes (Signed)
[facility] [phone]   [phone]  PROGRESS NOTE  Patient Care Team: Antony Contras, MD as PCP - General (Family Medicine) Fay Records, MD as PCP - Cardiology (Cardiology) Tat, Eustace Quail, DO as Consulting Physician (Neurology)  Hematological/Oncological History # Prostate Cancer T2N0 Gleason 4+3=7 12/2010: underwent a radical prostatectomy and lymph node dissection. 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 He had one out of 12 lymph nodes involved with cancer predominantly in the right pelvic wall.  04/2011: started on Lupron due to PSA rise from 0.65 to 0.89 04/2015: PSA went up to 0.3. Underwent salvage radiation therapy. Intermittent ADT started.  09/26/2021: last visit with Dr. Alen Blew.  04/07/2022: establish care with Dr. Lorenso Courier   Interval History:  [name] 77 y.o. male with medical history significant for early stage prostate cancer status post radical prior septectomy and subsequent salvage radiation therapy who presents for a follow up visit. The patient's last visit was on 09/26/2021 with Dr. Alen Blew. In the interim since the last visit he has had no major changes in his health.  On exam today [name] reports that he feels quite well.  He has not been having any issues with fevers, chills, sweats, nausea, vomiting or diarrhea.  He reports that he is at his baseline level of health with no major changes.  He denies any new bone or back pain.  He reports he does not have any issues with urinary frequency, urinary straining, or weak stream.  Overall he feels well and has no questions concerns or complaints today.  A full 10 point ROS was otherwise negative.  MEDICAL HISTORY:  Past Medical History:  Diagnosis Date   Arthritis    hands    Cancer ([place])    prostate cancer    Hyperlipidemia    Hypertension    Prostate cancer ([place])    Recurrent upper respiratory  infection (URI)    pt had a cold recent- week ago - better now    Tremor     SURGICAL HISTORY: Past Surgical History:  Procedure Laterality Date   CATARACT EXTRACTION Bilateral    LUMBAR LAMINECTOMY/DECOMPRESSION MICRODISCECTOMY Left 04/01/2018   Procedure: Left Lumbar four-lumbar five Microdiscectomy;  Surgeon: Erline Levine, MD;  Location: [facility];  Service: Neurosurgery;  Laterality: Left;   OTHER SURGICAL HISTORY  2005   right hand middle finger surgery    REPAIR OF CEREBROSPINAL FLUID LEAK N/A 05/18/2018   Procedure: Exploration of Lumbar four-five with repair of cerebrospinal fluid leak;  Surgeon: Erline Levine, MD;  Location: [facility];  Service: Neurosurgery;  Laterality: N/A;   ROBOT ASSISTED LAPAROSCOPIC RADICAL PROSTATECTOMY  01/13/2011   Procedure: ROBOTIC ASSISTED LAPAROSCOPIC RADICAL PROSTATECTOMY LEVEL 2;  Surgeon: Dutch Gray, MD;  [facility];  Service: Urology;  Laterality: Bilateral;  Robotic Assisted Laparoscopic Prostatetectomy with Bilateral Lymphadenectomy  Level 2    SOCIAL HISTORY: Social History   Socioeconomic History   Marital status: Married    Spouse name: Not on file   Number of children: 2   Years of education: Bachelors   Highest education level: Bachelor's degree (e.g., BA, AB, BS)  Occupational History   Occupation: Retired    Comment: Chief Financial Officer  Tobacco Use   Smoking status: Never   Smokeless tobacco: Never  Vaping Use   Vaping Use: Never used  Substance and Sexual Activity   Alcohol use: Not Currently    Comment: 1 drink per  day or less   Drug use: No   Sexual activity: Never  Other Topics Concern   Not on file  Social History Narrative   Lives at home with his wife.   Right-handed.   No caffeine use.   Social Determinants of Health   Financial Resource Strain: Not on file  Food Insecurity: Not on file  Transportation Needs: Not on file  Physical Activity: Not on file  Stress: Not on file  Social Connections: Not on file  Intimate  Partner Violence: Not on file    FAMILY HISTORY: Family History  Problem Relation Age of Onset   Heart attack Father    Hypertension Father    Cancer Mother        cervical with mets to lung   Hypertension Sister    Healthy Child     ALLERGIES:  is allergic to imipramine, lisinopril-hydrochlorothiazide, meloxicam, and tape.  MEDICATIONS:  Current Outpatient Medications  Medication Sig Dispense Refill   amLODipine (NORVASC) 2.5 MG tablet Take 1 tablet (2.5 mg total) by mouth daily. 90 tablet 3   ascorbic acid (VITAMIN C) 1000 MG tablet Take 1,000 mg by mouth daily. Pt takes 5 times a week     LORazepam (ATIVAN) 0.5 MG tablet Take 0.5 tablets (0.25 mg total) by mouth daily. 15 tablet 4   losartan (COZAAR) 50 MG tablet Take 50 mg by mouth daily.     Multiple Vitamin (MULTIVITAMIN WITH MINERALS) TABS tablet Take 1 tablet by mouth 2 (two) times a week.      NEURONTIN 300 MG capsule Take 300 mg by mouth 3 (three) times daily. 2 in the AM, 1 in the PM     primidone (MYSOLINE) 50 MG tablet 5 in the AM, 1 in the evening 540 tablet 2   Probiotic CHEW Chew by mouth. 1 chewable daily     rosuvastatin (CRESTOR) 10 MG tablet Take 1 tablet (10 mg total) by mouth daily. 90 tablet 3   zolpidem (AMBIEN CR) 12.5 MG CR tablet Take 6.25 mg by mouth at bedtime.     No current facility-administered medications for this visit.    REVIEW OF SYSTEMS:   Constitutional: ( - ) fevers, ( - )  chills , ( - ) night sweats Eyes: ( - ) blurriness of vision, ( - ) double vision, ( - ) watery eyes Ears, nose, mouth, throat, and face: ( - ) mucositis, ( - ) sore throat Respiratory: ( - ) cough, ( - ) dyspnea, ( - ) wheezes Cardiovascular: ( - ) palpitation, ( - ) chest discomfort, ( - ) lower extremity swelling Gastrointestinal:  ( - ) nausea, ( - ) heartburn, ( - ) change in bowel habits Skin: ( - ) abnormal skin rashes Lymphatics: ( - ) new lymphadenopathy, ( - ) easy bruising Neurological: ( - ) numbness, (  - ) tingling, ( - ) new weaknesses Behavioral/Psych: ( - ) mood change, ( - ) new changes  All other systems were reviewed with the patient and are negative.  PHYSICAL EXAMINATION:  Vitals:   03/27/22 1128  BP: (!) 151/72  Pulse: (!) 58  Resp: 20  Temp: (!) 97.3 F (36.3 C)  SpO2: 98%   Filed Weights   03/27/22 1128  Weight: 175 lb 11.2 oz (79.7 kg)    GENERAL: Well-appearing elderly Caucasian male, alert, no distress and comfortable SKIN: skin color, texture, turgor are normal, no rashes or significant lesions EYES: conjunctiva are pink and  non-injected, sclera clear LUNGS: clear to auscultation and percussion with normal breathing effort HEART: regular rate & rhythm and no murmurs and no lower extremity edema Musculoskeletal: no cyanosis of digits and no clubbing  PSYCH: alert & oriented x 3, fluent speech NEURO: no focal motor/sensory deficits  LABORATORY DATA:  I have reviewed the data as listed    Latest Ref Rng & Units 03/20/2022   10:01 AM 12/19/2021   10:35 AM 09/18/2021   11:23 AM  CBC  WBC 4.0 - 10.5 K/uL 4.7  5.6  6.5   Hemoglobin 13.0 - 17.0 g/dL 12.4  13.0  12.8   Hematocrit 39.0 - 52.0 % 37.0  39.4  37.1   Platelets 150 - 400 K/uL 205  174  196        Latest Ref Rng & Units 03/20/2022   10:01 AM 12/19/2021   10:35 AM 09/18/2021   11:23 AM  CMP  Glucose 70 - 99 mg/dL 88  82  96   BUN 8 - 23 mg/dL 22  21  26   $ Creatinine 0.61 - 1.24 mg/dL 0.87  0.89  1.24   Sodium 135 - 145 mmol/L 140  141  138   Potassium 3.5 - 5.1 mmol/L 4.2  4.7  4.0   Chloride 98 - 111 mmol/L 106  102  105   CO2 22 - 32 mmol/L 30  27  29   $ Calcium 8.9 - 10.3 mg/dL 9.9  10.5  9.6   Total Protein 6.5 - 8.1 g/dL 7.2   7.1   Total Bilirubin 0.3 - 1.2 mg/dL 0.4   0.4   Alkaline Phos 38 - 126 U/L 50   41   AST 15 - 41 U/L 16   17   ALT 0 - 44 U/L 10   12     RADIOGRAPHIC STUDIES: CT CHEST ABDOMEN PELVIS W CONTRAST  Result Date: 03/21/2022 CLINICAL DATA:  Restaging prostate  cancer. Prior prostatectomy with radiation therapy and Lupron treatment. * Tracking Code: BO * EXAM: CT CHEST, ABDOMEN, AND PELVIS WITH CONTRAST TECHNIQUE: Multidetector CT imaging of the chest, abdomen and pelvis was performed following the standard protocol during bolus administration of intravenous contrast. RADIATION DOSE REDUCTION: This exam was performed according to the departmental dose-optimization program which includes automated exposure control, adjustment of the mA and/or kV according to patient size and/or use of iterative reconstruction technique. CONTRAST:  180m OMNIPAQUE IOHEXOL 300 MG/ML  SOLN COMPARISON:  03/13/2021 FINDINGS: CT CHEST FINDINGS Cardiovascular: Ascending thoracic aortic aneurysm stable at 4.1 cm diameter. Coronary, aortic arch, and branch vessel atherosclerotic vascular disease. Mediastinum/Nodes: Unremarkable Lungs/Pleura: Biapical pleuroparenchymal scarring. Approximally three small lung nodules in the 3-4 mm diameter range are not changed from 01/06/2017 and are considered benign. Musculoskeletal: Thoracic spondylosis. CT ABDOMEN PELVIS FINDINGS Hepatobiliary: Several small hypodense hepatic lesions are unchanged from 2018 and considered benign. Largest is a hypodense lesion measuring 1.1 by 0.5 cm in the lateral segment left hepatic lobe on image 54 series 2. Gallbladder unremarkable.  No new or worrisome liver lesion. Pancreas: Unremarkable Spleen: Unremarkable Adrenals/Urinary Tract: Small caliber urinary bladder. Chronically stable benign left renal cysts. No further imaging workup of these lesions is indicated. Adrenal glands unremarkable. Stomach/Bowel: Unremarkable Vascular/Lymphatic: Small porta hepatis lymph nodes are within normal size limits. Currently no pathologic retroperitoneal or pelvic adenopathy. Atherosclerosis is present, including aortoiliac atherosclerotic disease. Left mesenteric lymph node has a short axis diameter of 0.7 cm on image 88 series 5, within  normal  limits. Reproductive: Prostatectomy. Urethral sphincter device with right lower quadrant reservoir, no complicating feature identified. Other: No supplemental non-categorized findings. Musculoskeletal: Mild spurring of the right sacroiliac joint. Mild lower lumbar spondylosis and degenerative disc disease. Moderate bilateral degenerative hip arthropathy. No compelling CT findings of osseous metastatic disease. IMPRESSION: 1. No findings of active malignancy. 2. Approximally three small lung nodules in the 3-4 mm diameter range are not changed from 01/06/2017 and are considered benign. 3. Small hypodense hepatic lesions are unchanged from 2018 and considered benign. 4. Prostatectomy. 5. Urethral sphincter device with right lower quadrant reservoir, no complicating feature identified. 6. Moderate bilateral degenerative hip arthropathy. Mild lower lumbar spondylosis and degenerative disc disease. 7. Ascending thoracic aortic aneurysm is stable at 4.1 cm in diameter. This may be followed in the context of the patient's follow up oncology imaging. Otherwise, recommend annual imaging followup by CTA or MRA. This recommendation follows 2010 ACCF/AHA/AATS/ACR/ASA/SCA/SCAI/SIR/STS/SVM Guidelines for the Diagnosis and Management of Patients with Thoracic Aortic Disease. Circulation. 2010; [id number]. Aortic aneurysm NOS (ICD10-I71.9) 8. Aortic atherosclerosis. Aortic Atherosclerosis (ICD10-I70.0). Electronically Signed   By: Van Clines M.D.   On: 03/21/2022 07:36    ASSESSMENT & PLAN [name] 77 y.o. male with medical history significant for early stage prostate cancer status post radical prior septectomy and subsequent salvage radiation therapy who presents for a follow up visit.   # Prostate Cancer T2N0 Gleason 4+3=7 -- Last labs on 03/20/2020 were show PSA less than 1.0 -- Other labs show white blood cell count 4.7, hemoglobin 12.4, MCV 91.6, and platelets of 205.  Creatinine and LFTs within  normal limits. -- No indication for imaging studies at this time.  Last CT scan on 03/21/2022 showed no evidence of residual or recurrent disease. -- After discussion with patient he was agreeable to repeat labs in 6 months time with follow-up clinic visit in 1 year.  No orders of the defined types were placed in this encounter.   All questions were answered. The patient knows to call the clinic with any problems, questions or concerns.  A total of more than 40 minutes were spent on this encounter with face-to-face time and non-face-to-face time, including preparing to see the patient, ordering tests and/or medications, counseling the patient and coordination of care as outlined above.   Ledell Peoples, MD Department of Hematology/Oncology [facility] Phone: [phone] Pager: [phone] Email: [email or web address]  04/13/2022 12:29 PM

## 2022-04-02 DIAGNOSIS — G588 Other specified mononeuropathies: Secondary | ICD-10-CM | POA: Diagnosis not present

## 2022-04-16 DIAGNOSIS — H5203 Hypermetropia, bilateral: Secondary | ICD-10-CM | POA: Diagnosis not present

## 2022-04-16 DIAGNOSIS — Z961 Presence of intraocular lens: Secondary | ICD-10-CM | POA: Diagnosis not present

## 2022-04-16 DIAGNOSIS — Z135 Encounter for screening for eye and ear disorders: Secondary | ICD-10-CM | POA: Diagnosis not present

## 2022-04-16 DIAGNOSIS — H04123 Dry eye syndrome of bilateral lacrimal glands: Secondary | ICD-10-CM | POA: Diagnosis not present

## 2022-04-16 DIAGNOSIS — H52223 Regular astigmatism, bilateral: Secondary | ICD-10-CM | POA: Diagnosis not present

## 2022-04-16 DIAGNOSIS — H524 Presbyopia: Secondary | ICD-10-CM | POA: Diagnosis not present

## 2022-04-17 ENCOUNTER — Telehealth: Payer: Self-pay | Admitting: Internal Medicine

## 2022-04-17 DIAGNOSIS — Z960 Presence of urogenital implants: Secondary | ICD-10-CM | POA: Diagnosis not present

## 2022-04-17 DIAGNOSIS — R102 Pelvic and perineal pain: Secondary | ICD-10-CM | POA: Diagnosis not present

## 2022-04-17 NOTE — Telephone Encounter (Signed)
Pt stated his bp is slightly elevated first reading this morning 181/88, 2nd reading 166/84, and 3rd reading 147/84. Currently asymptomatic.Patient stated he started taking Gabapentin and is concerned this is increasing his bp. Pt has scheduled appointment with APP on 2/28.

## 2022-04-17 NOTE — Telephone Encounter (Signed)
Pt c/o BP issue: STAT if pt c/o blurred vision, one-sided weakness or slurred speech  1. What are your last 5 BP readings? 109/68 121/55, 160/88, 161/81, today it is 181/88  2. Are you having any other symptoms (ex. Dizziness, headache, blurred vision, passed out)? A little weak yesterday  3. What is your BP issue? Not sure if pain medicine(Gabapentin) is not causing his pressure to run high- patient wanted an appointment- first available anywhere was 05-15-22 with Laurann Montana

## 2022-04-21 DIAGNOSIS — R102 Pelvic and perineal pain: Secondary | ICD-10-CM | POA: Diagnosis not present

## 2022-04-21 DIAGNOSIS — C61 Malignant neoplasm of prostate: Secondary | ICD-10-CM | POA: Diagnosis not present

## 2022-04-21 DIAGNOSIS — N393 Stress incontinence (female) (male): Secondary | ICD-10-CM | POA: Diagnosis not present

## 2022-04-22 NOTE — Progress Notes (Unsigned)
Office Visit    Patient Name: [name] Date of Encounter: 04/22/2022  Primary Care Provider:  Antony Contras, MD Primary Cardiologist:  Dorris Carnes, MD Primary Electrophysiologist: None  Chief Complaint    [name] is a 77 y.o. male with PMH of nonobstructive CAD, HTN, HLD, prostate CA s/p radical prostatectomy who presents today for complaint of hypertension.  Past Medical History    Past Medical History:  Diagnosis Date   Arthritis    hands    Cancer ([place])    prostate cancer    Hyperlipidemia    Hypertension    Prostate cancer ([place])    Recurrent upper respiratory infection (URI)    pt had a cold recent- week ago - better now    Tremor    Past Surgical History:  Procedure Laterality Date   CATARACT EXTRACTION Bilateral    LUMBAR LAMINECTOMY/DECOMPRESSION MICRODISCECTOMY Left 04/01/2018   Procedure: Left Lumbar four-lumbar five Microdiscectomy;  Surgeon: Erline Levine, MD;  Location: [facility];  Service: Neurosurgery;  Laterality: Left;   OTHER SURGICAL HISTORY  2005   right hand middle finger surgery    REPAIR OF CEREBROSPINAL FLUID LEAK N/A 05/18/2018   Procedure: Exploration of Lumbar four-five with repair of cerebrospinal fluid leak;  Surgeon: Erline Levine, MD;  Location: [facility];  Service: Neurosurgery;  Laterality: N/A;   ROBOT ASSISTED LAPAROSCOPIC RADICAL PROSTATECTOMY  01/13/2011   Procedure: ROBOTIC ASSISTED LAPAROSCOPIC RADICAL PROSTATECTOMY LEVEL 2;  Surgeon: Dutch Gray, MD;  Location: [facility];  Service: Urology;  Laterality: Bilateral;  Robotic Assisted Laparoscopic Prostatetectomy with Bilateral Lymphadenectomy  Level 2    Allergies  Allergies  Allergen Reactions   Imipramine Other (See Comments)   Lisinopril-Hydrochlorothiazide     Other reaction(s): cough?   Meloxicam     Other reaction(s): rash?... Able to tolerate Aleve without problems   Tape Other (See Comments) and Rash    Surgical tape=redness/rash at site    History of Present Illness     [name]  is a 77 year old male with the above mention past medical history who presents today for complaint of elevated blood pressure.  He was recently started on gabapentin and is concerned that this is increasing his blood pressure.  He was seen by his PCP and had his losartan increased to 50 mg.  He was last seen by Dr. Harrington Challenger on 02/03/2022 and during visit patient's blood pressure was controlled.  He was reporting severe perineal pain and was referred to [place] [facility] for evaluation.  He reported history of syncope in the past with medications.  Since last being seen in the office patient reports***.  Patient denies chest pain, palpitations, dyspnea, PND, orthopnea, nausea, vomiting, dizziness, syncope, edema, weight gain, or early satiety.     ***Notes: -Patient is currently on losartan 50 mg and Norvasc 2.5 mg  Home Medications    Current Outpatient Medications  Medication Sig Dispense Refill   amLODipine (NORVASC) 2.5 MG tablet Take 1 tablet (2.5 mg total) by mouth daily. 90 tablet 3   ascorbic acid (VITAMIN C) 1000 MG tablet Take 1,000 mg by mouth daily. Pt takes 5 times a week     LORazepam (ATIVAN) 0.5 MG tablet Take 0.5 tablets (0.25 mg total) by mouth daily. 15 tablet 4   losartan (COZAAR) 50 MG tablet Take 50 mg by mouth daily.     Multiple Vitamin (MULTIVITAMIN WITH MINERALS) TABS tablet Take 1 tablet by mouth 2 (two) times a  week.      NEURONTIN 300 MG capsule Take 300 mg by mouth 3 (three) times daily. 2 in the AM, 1 in the PM     primidone (MYSOLINE) 50 MG tablet 5 in the AM, 1 in the evening 540 tablet 2   Probiotic CHEW Chew by mouth. 1 chewable daily     rosuvastatin (CRESTOR) 10 MG tablet Take 1 tablet (10 mg total) by mouth daily. 90 tablet 3   zolpidem (AMBIEN CR) 12.5 MG CR tablet Take 6.25 mg by mouth at bedtime.     No current facility-administered medications for this visit.     Review of Systems  Please see the history of present illness.     (+)*** (+)***  All other systems reviewed and are otherwise negative except as noted above.  Physical Exam    Wt Readings from Last 3 Encounters:  03/27/22 175 lb 11.2 oz (79.7 kg)  02/25/22 180 lb 12.8 oz (82 kg)  02/03/22 176 lb 6.4 oz (80 kg)   [id number] were no vitals filed for this visit.,There is no height or weight on file to calculate BMI.  Constitutional:      Appearance: Healthy appearance. Not in distress.  Neck:     Vascular: JVD normal.  Pulmonary:     Effort: Pulmonary effort is normal.     Breath sounds: No wheezing. No rales. Diminished in the bases Cardiovascular:     Normal rate. Regular rhythm. Normal S1. Normal S2.      Murmurs: There is no murmur.  Edema:    Peripheral edema absent.  Abdominal:     Palpations: Abdomen is soft non tender. There is no hepatomegaly.  Skin:    General: Skin is warm and dry.  Neurological:     General: No focal deficit present.     Mental Status: Alert and oriented to person, place and time.     Cranial Nerves: Cranial nerves are intact.  EKG/LABS/Other Studies Reviewed    ECG personally reviewed by me today - ***  Risk Assessment/Calculations:   {Does this patient have ATRIAL FIBRILLATION?:[phone]}        Lab Results  Component Value Date   WBC 4.7 03/20/2022   HGB 12.4 (L) 03/20/2022   HCT 37.0 (L) 03/20/2022   MCV 91.6 03/20/2022   PLT 205 03/20/2022   Lab Results  Component Value Date   CREATININE 0.87 03/20/2022   BUN 22 03/20/2022   NA 140 03/20/2022   K 4.2 03/20/2022   CL 106 03/20/2022   CO2 30 03/20/2022   Lab Results  Component Value Date   ALT 10 03/20/2022   AST 16 03/20/2022   ALKPHOS 50 03/20/2022   BILITOT 0.4 03/20/2022   Lab Results  Component Value Date   CHOL 121 07/13/2019   HDL 41 07/13/2019   LDLCALC 60 07/13/2019   TRIG 110 07/13/2019   CHOLHDL 3.0 07/13/2019    No results found for: "HGBA1C"  Assessment & Plan    1.  Essential hypertension  2.   Hyperlipidemia  3.History of prostate cancer  4.***      Disposition: Follow-up with Dorris Carnes, MD or APP in *** months {Are you ordering a CV Procedure (e.g. stress test, cath, DCCV, TEE, etc)?   Press F2        :[id number]   Medication Adjustments/Labs and Tests Ordered: Current medicines are reviewed at length with the patient today.  Concerns regarding medicines are outlined above.   Signed, Barbarann Ehlers  Silvio Pate, NP 04/22/2022, 7:20 PM [facility]  Note:  This document was prepared using Dragon voice recognition software and may include unintentional dictation errors.

## 2022-04-23 ENCOUNTER — Ambulatory Visit: Payer: Medicare Other | Attending: Nurse Practitioner | Admitting: Nurse Practitioner

## 2022-04-23 ENCOUNTER — Encounter: Payer: Self-pay | Admitting: Nurse Practitioner

## 2022-04-23 VITALS — BP 118/58 | HR 80 | Ht 71.0 in | Wt 178.6 lb

## 2022-04-23 DIAGNOSIS — I10 Essential (primary) hypertension: Secondary | ICD-10-CM | POA: Diagnosis not present

## 2022-04-23 DIAGNOSIS — C61 Malignant neoplasm of prostate: Secondary | ICD-10-CM | POA: Insufficient documentation

## 2022-04-23 DIAGNOSIS — E785 Hyperlipidemia, unspecified: Secondary | ICD-10-CM | POA: Insufficient documentation

## 2022-04-23 MED ORDER — AMLODIPINE BESYLATE 2.5 MG PO TABS: 2.5000 mg | ORAL_TABLET | Freq: Every day | ORAL | 3 refills | Status: DC

## 2022-04-23 MED ORDER — LOSARTAN POTASSIUM 100 MG PO TABS: 100.0000 mg | ORAL_TABLET | Freq: Every day | ORAL | 1 refills | Status: DC

## 2022-04-23 NOTE — Patient Instructions (Addendum)
Medication Instructions:  INCREASE Losartan '100mg'$  Take 1 tablet once a day *If you need a refill on your cardiac medications before your next appointment, please call your pharmacy*   Lab Work: 2 weeks BMET  If you have labs (blood work) drawn today and your tests are completely normal, you will receive your results only by: [place] (if you have MyChart) OR A paper copy in the mail If you have any lab test that is abnormal or we need to change your treatment, we will call you to review the results.   Testing/Procedures: NONE ORDERED   Follow-Up: At [facility], you and your health needs are our priority.  As part of our continuing mission to provide you with exceptional heart care, we have created designated Provider Care Teams.  These Care Teams include your primary Cardiologist (physician) and Advanced Practice Providers (APPs -  Physician Assistants and Nurse Practitioners) who all work together to provide you with the care you need, when you need it.  We recommend signing up for the patient portal called "MyChart".  Sign up information is provided on this After Visit Summary.  MyChart is used to connect with patients for Virtual Visits (Telemedicine).  Patients are able to view lab/test results, encounter notes, upcoming appointments, etc.  Non-urgent messages can be sent to your provider as well.   To learn more about what you can do with MyChart, go to [email or web address].    Your next appointment:   1 month(s)  Provider:   Ambrose Pancoast, NP       Other Instructions CHECK YOUR BLOOD PRESSURE DAILY FOR 2 WEEKS THEN CONTACT THE OFFICE WITH YOUR READINGS.

## 2022-05-07 ENCOUNTER — Other Ambulatory Visit: Payer: Medicare Other

## 2022-05-08 ENCOUNTER — Ambulatory Visit: Payer: Medicare Other | Attending: Nurse Practitioner

## 2022-05-08 DIAGNOSIS — I10 Essential (primary) hypertension: Secondary | ICD-10-CM | POA: Diagnosis not present

## 2022-05-08 DIAGNOSIS — E785 Hyperlipidemia, unspecified: Secondary | ICD-10-CM | POA: Diagnosis not present

## 2022-05-08 LAB — BASIC METABOLIC PANEL
BUN/Creatinine Ratio: 21 (ref 10–24)
BUN: 19 mg/dL (ref 8–27)
CO2: 23 mmol/L (ref 20–29)
Calcium: 9.4 mg/dL (ref 8.6–10.2)
Chloride: 104 mmol/L (ref 96–106)
Creatinine, Ser: 0.9 mg/dL (ref 0.76–1.27)
Glucose: 87 mg/dL (ref 70–99)
Potassium: 4.3 mmol/L (ref 3.5–5.2)
Sodium: 141 mmol/L (ref 134–144)
eGFR: 89 mL/min/{1.73_m2} (ref >59)

## 2022-05-09 ENCOUNTER — Ambulatory Visit: Payer: Self-pay

## 2022-05-09 ENCOUNTER — Encounter: Payer: Self-pay | Admitting: Emergency Medicine

## 2022-05-09 ENCOUNTER — Ambulatory Visit
Admission: EM | Admit: 2022-05-09 | Discharge: 2022-05-09 | Disposition: A | Discharge status: Home / Self Care | Payer: Medicare Other | Attending: Family Medicine | Admitting: Family Medicine

## 2022-05-09 DIAGNOSIS — K5900 Constipation, unspecified: Secondary | ICD-10-CM | POA: Diagnosis not present

## 2022-05-09 NOTE — ED Triage Notes (Signed)
Pt reports last normal bowel movement was 1 week ago.  Did have one yesterday but reports had to strain significantly.  No blood in stool or with wiping.  Has took miralax two weeks ago and then was having "too many" bowel movements so he stopped it.  Does have intermittent leakage for quite a while.  Hx prostate surgery and has had artificial sphincter.

## 2022-05-09 NOTE — Telephone Encounter (Signed)
Trouble with bowel movements / stated it is painful when having and after / pt called PCE but thought he was calling UC/ please advise     Chief Complaint: Rectal pain, has had some constipation. PCP told him to go to UC. Symptoms: Pain Frequency: 6 months ago Pertinent Negatives: Patient denies blood in stool Disposition: [] ED /[x] Urgent Care (no appt availability in office) / [] Appointment(In office/virtual)/ []  [facility]/ [] Home Care/ [] Refused Recommended Disposition /[] [facility]/ []  Follow-up with PCP Additional Notes:   Reason for Disposition  [1] Sudden onset rectal pain AND [2] constipated (straining, rectal pressure or fullness) AND [3] NOT better after SITZ bath, suppository or enema  Answer Assessment - Initial Assessment Questions 1. SYMPTOM:  "What's the main symptom you're concerned about?" (e.g., pain, itching, swelling, rash)     Pain 2. ONSET: "When did the   start?"     6 months 3. RECTAL PAIN: "Do you have any pain around your rectum?" "How bad is the pain?"  (Scale 0-10; or mild, moderate, severe)   - NONE (0): no pain   - MILD (1-3): doesn't interfere with normal activities    - MODERATE (4-7): interferes with normal activities or awakens from sleep, limping    - SEVERE (8-10): excruciating pain, unable to have a bowel movement      3 4. RECTAL ITCHING: "Do you have any itching in this area?" "How bad is the itching?"  (Scale 0-10; or mild, moderate, severe)   - NONE: no itching   - MILD: doesn't interfere with normal activities    - MODERATE-SEVERE: interferes with normal activities or awakens from sleep     No 5. CONSTIPATION: "Do you have constipation?" If Yes, ask: "How bad is it?"     Yes 6. CAUSE: "What do you think is causing the anus symptoms?"     Unsure 7. OTHER SYMPTOMS: "Do you have any other symptoms?"  (e.g., abdomen pain, fever, rectal bleeding, vomiting)     No 8. PREGNANCY: "Is there any chance you are pregnant?"  "When was your last menstrual period?"     N/a  Protocols used: Rectal Symptoms-A-AH

## 2022-05-09 NOTE — Discharge Instructions (Signed)
Please try Dulcolax suppository for your constipation  Also fleets enema may be helpful.  If you worsen with increased pain, or begin vomiting or having fevers, or if you do not improve with the constipation, please present to the emergency room for further evaluation

## 2022-05-09 NOTE — ED Provider Notes (Signed)
[facility]    CSN: [id number] Arrival date & time: 05/09/22  1027      History   Chief Complaint Chief Complaint  Patient presents with   Constipation    Rectal pain - Entered by patient    HPI [name] is a 77 y.o. male.    Constipation  Here for rectal pain and constipation.  He does have chronic trouble with constipation and rectal issues.  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  He is also had some chronic groin and rectal pain since then.  He has had a nerve block done a couple of weeks ago that actually did finally help.   Then 4 days ago he had 3 episodes of loose stools and diarrhea, after being exposed to his grandson who had had a stomach virus prior to that.  Then he was not able to have a normal bowel movement until yesterday when he had a hard small bowel movement.  Today he is having increased rectal pain and pressure.  No fever and no vomiting.  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   Herniated lumbar disc without myelopathy 04/01/2018   Essential tremor 01/24/2016   Prostate cancer ([place]) 03/10/2013    Past Surgical History:  Procedure Laterality Date   CATARACT EXTRACTION Bilateral    LUMBAR LAMINECTOMY/DECOMPRESSION MICRODISCECTOMY Left 04/01/2018   Procedure: Left Lumbar four-lumbar five Microdiscectomy;  Surgeon: Erline Levine, MD;  Location: [place];  Service: Neurosurgery;  Laterality: Left;   OTHER SURGICAL HISTORY  2005   right hand middle finger surgery    REPAIR OF CEREBROSPINAL FLUID LEAK N/A 05/18/2018   Procedure: Exploration of Lumbar four-five with repair of cerebrospinal fluid  leak;  Surgeon: Erline Levine, MD;  Location: [place];  Service: Neurosurgery;  Laterality: N/A;   ROBOT ASSISTED LAPAROSCOPIC RADICAL PROSTATECTOMY  01/13/2011   Procedure: ROBOTIC ASSISTED LAPAROSCOPIC RADICAL PROSTATECTOMY LEVEL 2;  Surgeon: Dutch Gray, MD;  Location: [facility];  Service: Urology;  Laterality: Bilateral;  Robotic Assisted Laparoscopic Prostatetectomy with Bilateral Lymphadenectomy  Level 2       Home Medications    Prior to Admission medications   Medication Sig Start Date End Date Taking? Authorizing Provider  acetaminophen (TYLENOL) 325 MG tablet Take 325 mg by mouth every 6 (six) hours as needed for mild pain, moderate pain, fever or headache. 09/24/21   [provider]  amLODipine (NORVASC) 2.5 MG tablet Take 1 tablet (2.5 mg total) by mouth daily. 04/23/22 04/23/23  Marylu Lund., NP  ascorbic acid (VITAMIN C) 1000 MG tablet Take 1,000 mg by mouth daily. Pt takes 5 times a week    [provider]  gabapentin (NEURONTIN) 300 MG capsule Take 300 mg by mouth as directed. Takes 3 pills a day    [provider]  LORazepam (ATIVAN) 0.5 MG tablet Take 0.5 mg by mouth as directed. For Anxiety    [provider]  losartan (COZAAR) 100 MG tablet Take 1 tablet (100 mg total) by mouth  daily. 04/23/22   Marylu Lund., NP  Multiple Vitamin (MULTIVITAMIN WITH MINERALS) TABS tablet Take 1 tablet by mouth 2 (two) times a week.     [provider]  primidone (MYSOLINE) 50 MG tablet 5 in the AM, 1 in the evening 02/25/22   Tat, Eustace Quail, DO  Probiotic CHEW Chew by mouth. 2 chewable daily    [provider]  rosuvastatin (CRESTOR) 10 MG tablet Take 1 tablet (10 mg total) by mouth daily. 05/12/19   Fay Records, MD  zolpidem (AMBIEN CR) 12.5 MG CR tablet Take 6.25 mg by mouth at bedtime.    [provider]    Family History Family History  Problem Relation Age of Onset   Heart attack Father    Hypertension Father     Cancer Mother        cervical with mets to lung   Hypertension Sister    Healthy Child     Social History Social History   Tobacco Use   Smoking status: Never   Smokeless tobacco: Never  Vaping Use   Vaping Use: Never used  Substance Use Topics   Alcohol use: Not Currently    Comment: 1 drink per day or less   Drug use: No     Allergies   Imipramine, Lisinopril-hydrochlorothiazide, Meloxicam, and Tape   Review of Systems Review of Systems  Gastrointestinal:  Positive for constipation.     Physical Exam Triage Vital Signs ED Triage Vitals  Enc Vitals Group     BP 05/09/22 1234 (!) 144/74     Pulse Rate 05/09/22 1234 (!) 58     Resp 05/09/22 1234 16     Temp 05/09/22 1234 98 F (36.7 C)     Temp Source 05/09/22 1234 Oral     SpO2 05/09/22 1234 97 %     Weight 05/09/22 1237 176 lb (79.8 kg)     Height 05/09/22 1237 5\' 11"  (1.803 m)     Head Circumference --      Peak Flow --      Pain Score 05/09/22 1236 3     Pain Loc --      Pain Edu? --      Excl. in [place]? --    No data found.  Updated Vital Signs BP (!) 144/74   Pulse (!) 58   Temp 98 F (36.7 C) (Oral)   Resp 16   Ht 5\' 11"  (1.803 m)   Wt 79.8 kg   SpO2 97%   BMI 24.55 kg/m   Visual Acuity Right Eye Distance:   Left Eye Distance:   Bilateral Distance:    Right Eye Near:   Left Eye Near:    Bilateral Near:     Physical Exam Vitals reviewed.  Constitutional:      General: He is not in acute distress.    Appearance: He is not ill-appearing, toxic-appearing or diaphoretic.  HENT:     Mouth/Throat:     Mouth: Mucous membranes are moist.  Eyes:     Extraocular Movements: Extraocular movements intact.     Pupils: Pupils are equal, round, and reactive to light.  Cardiovascular:     Rate and Rhythm: Normal rate and regular rhythm.  Pulmonary:     Effort: Pulmonary effort is normal.     Breath sounds: Normal breath sounds.  Abdominal:     General: There is no distension.      Palpations: Abdomen is soft.  Tenderness: There is no abdominal tenderness. There is no guarding.  Genitourinary:    Comments: There is no external hemorrhoid or swelling around the rectum. Musculoskeletal:     Cervical back: Neck supple.  Lymphadenopathy:     Cervical: No cervical adenopathy.  Skin:    Coloration: Skin is not pale.  Neurological:     General: No focal deficit present.     Mental Status: He is alert.  Psychiatric:        Behavior: Behavior normal.      UC Treatments / Results  Labs (all labs ordered are listed, but only abnormal results are displayed) Labs Reviewed - No data to display  EKG   Radiology No results found.  Procedures Procedures (including critical care time)  Medications Ordered in UC Medications - No data to display  Initial Impression / Assessment and Plan / UC Course  I have reviewed the triage vital signs and the nursing notes.  Pertinent labs & imaging results that were available during my care of the patient were reviewed by me and considered in my medical decision making (see chart for details).        I have asked him to use Dulcolax suppository and possibly a fleets enema at home.  If he does not relieve the constipation or symptoms worsen anyway, he may present to the emergency room for further evaluation Final Clinical Impressions(s) / UC Diagnoses   Final diagnoses:  Constipation, unspecified constipation type     Discharge Instructions      Please try Dulcolax suppository for your constipation  Also fleets enema may be helpful.  If you worsen with increased pain, or begin vomiting or having fevers, or if you do not improve with the constipation, please present to the emergency room for further evaluation     ED Prescriptions   None    PDMP not reviewed this encounter.   Barrett Henle, MD 05/09/22 [phone]

## 2022-05-09 NOTE — ED Notes (Signed)
Chaperoned MD for rectal exam

## 2022-05-09 NOTE — Progress Notes (Deleted)
  Subjective:    [name] - 77 y.o. male MRN [id number]  Date of birth: 30-Apr-1945  HPI  GUAGE RIOPEL is to establish care.   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 He has never used smokeless tobacco. He reports that he does not currently use alcohol. He reports that he does not use drugs.   Family History  family history includes Cancer in his mother; Healthy in his child; Heart attack in his father; Hypertension in his father and sister.   Medications: reviewed and updated   Objective:   Physical Exam There were no vitals taken for this visit. Physical Exam      Assessment & Plan:         Patient was given clear instructions to go to Emergency Department or return to medical center if symptoms don't improve, worsen, or new problems develop.The patient verbalized understanding.  I discussed the assessment and treatment plan with the patient. The patient was provided an opportunity to ask questions and all were answered. The patient agreed with the plan and demonstrated an understanding of the instructions.   The patient was advised to call back or seek an in-person evaluation if the symptoms worsen or if the condition fails to improve as anticipated.    Durene Fruits, NP 05/09/2022, 12:19 PM Primary Care at [facility]

## 2022-05-12 ENCOUNTER — Ambulatory Visit: Payer: Medicare Other | Admitting: Family

## 2022-05-12 DIAGNOSIS — Z7689 Persons encountering health services in other specified circumstances: Secondary | ICD-10-CM

## 2022-05-15 ENCOUNTER — Ambulatory Visit (HOSPITAL_BASED_OUTPATIENT_CLINIC_OR_DEPARTMENT_OTHER): Payer: Medicare Other | Admitting: Family

## 2022-05-19 NOTE — Progress Notes (Unsigned)
Office Visit    Patient Name: [name] Date of Encounter: 05/19/2022  Primary Care Provider:  Antony Contras, MD Primary Cardiologist:  David Carnes, MD Primary Electrophysiologist: None  Chief Complaint    [name] is a 77 y.o. male with PMH of nonobstructive CAD, HTN, HLD, prostate CA s/p radical prostatectomy who presents today for follow up of hypertension.   Past Medical History    Past Medical History:  Diagnosis Date   Arthritis    hands    Cancer ([place])    prostate cancer    Hyperlipidemia    Hypertension    Prostate cancer ([place])    Recurrent upper respiratory infection (URI)    pt had a cold recent- week ago - better now    Tremor    Past Surgical History:  Procedure Laterality Date   CATARACT EXTRACTION Bilateral    LUMBAR LAMINECTOMY/DECOMPRESSION MICRODISCECTOMY Left 04/01/2018   Procedure: Left Lumbar four-lumbar five Microdiscectomy;  Surgeon: David Levine, MD;  Location: [place];  Service: Neurosurgery;  Laterality: Left;   OTHER SURGICAL HISTORY  2005   right hand middle finger surgery    REPAIR OF CEREBROSPINAL FLUID LEAK N/A 05/18/2018   Procedure: Exploration of Lumbar four-five with repair of cerebrospinal fluid leak;  Surgeon: David Levine, MD;  Location: [place];  Service: Neurosurgery;  Laterality: N/A;   ROBOT ASSISTED LAPAROSCOPIC RADICAL PROSTATECTOMY  01/13/2011   Procedure: ROBOTIC ASSISTED LAPAROSCOPIC RADICAL PROSTATECTOMY LEVEL 2;  Surgeon: David Gray, MD;  Location[facility];  Service: Urology;  Laterality: Bilateral;  Robotic Assisted Laparoscopic Prostatetectomy with Bilateral Lymphadenectomy  Level 2    Allergies  Allergies  Allergen Reactions   Imipramine Other (See Comments)   Lisinopril-Hydrochlorothiazide     Other reaction(s): cough?   Meloxicam     Other reaction(s): rash?... Able to tolerate Aleve without problems   Tape Other (See Comments) and Rash    Surgical tape=redness/rash at site    History of Present Illness     [name]  is a 77 year old male with the above mention past medical history who presents today for complaint of elevated blood pressure.  He was recently started on gabapentin and is concerned that this is increasing his blood pressure.  He was seen by his PCP and had his losartan increased to 50 mg.  He was last seen by Dr. Harrington [name] on 02/03/2022 and during visit patient's blood pressure was controlled.  He was reporting severe perineal pain and was referred to [name] neurosurgery for evaluation. He reported history of syncope in the past with medications.  He was seen on 04/23/2022 for complaint of hypertension.  Per his records his blood pressures have ranged from the 123456 to 0000000 systolically over 0000000 and 70s.  Losartan was increased to 100 mg daily  [name] presents today for 1 month follow-up of hypertension.  Since last being seen in the office patient reports that he has been feeling much better and blood pressures have been controlled at home.  He brought in readings that range from the 115 to 120s over 60s to 70s.  He has maintained compliance with his medications and denies any adverse reactions.  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  He is currently trying to wean off gabapentin but is having pain concerns and increased burning and tingling in his lower extremities. He is abstaining from excess salt in his diet.  Patient  denies chest pain, palpitations, dyspnea, PND, orthopnea, nausea, vomiting, dizziness, syncope, edema, weight gain, or early satiety.   Home Medications    Current Outpatient Medications  Medication Sig Dispense Refill   acetaminophen (TYLENOL) 325 MG tablet Take 325 mg by mouth every 6 (six) hours as needed for mild pain, moderate pain, fever or headache.     amLODipine (NORVASC) 2.5 MG tablet Take 1 tablet (2.5 mg total) by mouth daily. 90 tablet 3   ascorbic acid (VITAMIN C) 1000 MG tablet Take  1,000 mg by mouth daily. Pt takes 5 times a week     gabapentin (NEURONTIN) 300 MG capsule Take 300 mg by mouth as directed. Takes 3 pills a day     LORazepam (ATIVAN) 0.5 MG tablet Take 0.5 mg by mouth as directed. For Anxiety     losartan (COZAAR) 100 MG tablet Take 1 tablet (100 mg total) by mouth daily. 30 tablet 1   Multiple Vitamin (MULTIVITAMIN WITH MINERALS) TABS tablet Take 1 tablet by mouth 2 (two) times a week.      primidone (MYSOLINE) 50 MG tablet 5 in the AM, 1 in the evening 540 tablet 2   Probiotic CHEW Chew by mouth. 2 chewable daily     rosuvastatin (CRESTOR) 10 MG tablet Take 1 tablet (10 mg total) by mouth daily. 90 tablet 3   zolpidem (AMBIEN CR) 12.5 MG CR tablet Take 6.25 mg by mouth at bedtime.     No current facility-administered medications for this visit.     Review of Systems  Please see the history of present illness.    (+) Chronic perineal pain All other systems reviewed and are otherwise negative except as noted above.  Physical Exam    Wt Readings from Last 3 Encounters:  05/09/22 176 lb (79.8 kg)  04/23/22 178 lb 9.6 oz (81 kg)  03/27/22 175 lb 11.2 oz (79.7 kg)   [id number] were no vitals filed for this visit.,There is no height or weight on file to calculate BMI.  Constitutional:      Appearance: Healthy appearance. Not in distress.  Neck:     Vascular: JVD normal.  Pulmonary:     Effort: Pulmonary effort is normal.     Breath sounds: No wheezing. No rales. Diminished in the bases Cardiovascular:     Normal rate. Regular rhythm. Normal S1. Normal S2.      Murmurs: There is no murmur.  Edema:    Peripheral edema absent.  Abdominal:     Palpations: Abdomen is soft non tender. There is no hepatomegaly.  Skin:    General: Skin is warm and dry.  Neurological:     General: No focal deficit present.     Mental Status: Alert and oriented to person, place and time.     Cranial Nerves: Cranial nerves are intact.  EKG/LABS/ Recent Cardiac Studies     ECG personally reviewed by me today -none completed today  Lab Results  Component Value Date   WBC 4.7 03/20/2022   HGB 12.4 (L) 03/20/2022   HCT 37.0 (L) 03/20/2022   MCV 91.6 03/20/2022   PLT 205 03/20/2022   Lab Results  Component Value Date   CREATININE 0.90 05/08/2022   BUN 19 05/08/2022   NA 141 05/08/2022   K 4.3 05/08/2022   CL 104 05/08/2022   CO2 23 05/08/2022   Lab Results  Component Value Date   ALT 10 03/20/2022   AST 16 03/20/2022   ALKPHOS 50 03/20/2022  BILITOT 0.4 03/20/2022   Lab Results  Component Value Date   CHOL 121 07/13/2019   HDL 41 07/13/2019   LDLCALC 60 07/13/2019   TRIG 110 07/13/2019   CHOLHDL 3.0 07/13/2019    No results found for: "HGBA1C"  Cardiac Studies & Procedures                  Assessment & Plan    1.  Essential hypertension: -Today patient's blood pressure was well-controlled at 120/60 -He is tolerating his current medications and denies any adverse reactions. -Continue losartan 100 mg daily and Norvasc 2.5 mg daily   2.  Hyperlipidemia: -Patient's most recent LDL cholesterol was 67 on 03/07/2022 -Continue Crestor 10 mg daily   3.History of prostate cancer: -s/p prostatectomy and is currently followed by urology  Disposition: Follow-up with David Carnes, MD or APP in 9 months   Medication Adjustments/Labs and Tests Ordered: Current medicines are reviewed at length with the patient today.  Concerns regarding medicines are outlined above.   Signed, Mable Fill, Marissa Nestle, NP 05/19/2022, 7:02 PM [facility]  Note:  This document was prepared using Dragon voice recognition software and may include unintentional dictation errors.

## 2022-05-20 ENCOUNTER — Ambulatory Visit: Payer: Medicare Other | Attending: Nurse Practitioner | Admitting: Nurse Practitioner

## 2022-05-20 ENCOUNTER — Encounter: Payer: Self-pay | Admitting: Nurse Practitioner

## 2022-05-20 ENCOUNTER — Ambulatory Visit (INDEPENDENT_AMBULATORY_CARE_PROVIDER_SITE_OTHER): Payer: Medicare Other | Admitting: Family Medicine

## 2022-05-20 ENCOUNTER — Encounter: Payer: Self-pay | Admitting: Family Medicine

## 2022-05-20 VITALS — BP 110/67 | HR 63 | Ht 71.0 in | Wt 173.0 lb

## 2022-05-20 VITALS — BP 120/60 | HR 66 | Ht 71.0 in | Wt 176.6 lb

## 2022-05-20 DIAGNOSIS — E782 Mixed hyperlipidemia: Secondary | ICD-10-CM | POA: Diagnosis not present

## 2022-05-20 DIAGNOSIS — K5909 Other constipation: Secondary | ICD-10-CM | POA: Insufficient documentation

## 2022-05-20 DIAGNOSIS — F5101 Primary insomnia: Secondary | ICD-10-CM

## 2022-05-20 DIAGNOSIS — G2581 Restless legs syndrome: Secondary | ICD-10-CM | POA: Diagnosis not present

## 2022-05-20 DIAGNOSIS — N39498 Other specified urinary incontinence: Secondary | ICD-10-CM | POA: Diagnosis not present

## 2022-05-20 DIAGNOSIS — I10 Essential (primary) hypertension: Secondary | ICD-10-CM

## 2022-05-20 DIAGNOSIS — G4700 Insomnia, unspecified: Secondary | ICD-10-CM | POA: Insufficient documentation

## 2022-05-20 DIAGNOSIS — G250 Essential tremor: Secondary | ICD-10-CM | POA: Diagnosis not present

## 2022-05-20 DIAGNOSIS — C61 Malignant neoplasm of prostate: Secondary | ICD-10-CM | POA: Diagnosis not present

## 2022-05-20 DIAGNOSIS — K5900 Constipation, unspecified: Secondary | ICD-10-CM | POA: Insufficient documentation

## 2022-05-20 DIAGNOSIS — R32 Unspecified urinary incontinence: Secondary | ICD-10-CM | POA: Insufficient documentation

## 2022-05-20 MED ORDER — LOSARTAN POTASSIUM 100 MG PO TABS: 100.0000 mg | ORAL_TABLET | Freq: Every day | ORAL | 3 refills | Status: DC

## 2022-05-20 MED ORDER — MAGNESIUM OXIDE -MG SUPPLEMENT 200 MG PO TABS: 1.0000 | ORAL_TABLET | Freq: Two times a day (BID) | ORAL | 0 refills | Status: DC

## 2022-05-20 NOTE — Assessment & Plan Note (Signed)
Continue zolpidem. 

## 2022-05-20 NOTE — Progress Notes (Signed)
New Patient Office Visit  Subjective    Patient ID: [name], male    DOB: [birth year]  Age: 77 y.o. MRN: [id number]  CC:  Chief Complaint  Patient presents with   Establish Care   Constipation    Onset x months     HPI [name] presents to establish care He was  going to [employer] at [facility]   Doesn't drive as well now, and this location is closer, so he is switching physicians.  Not driving nearly as much in the past seven months due to pain from surgery.   In July the pt had Artificial sphincter surgery on his urethra d/t incontinence. Since then he has had significant groin pain.  Had surgery at [facility].   Also saw interventional radiology for nerve blocks to treat the pain.  Pt also sees a pain specialist, [name].  Has a sharp pain superficially on the scrotum for which he uses lidocaine cream which has helped.      Complains of constipation. Went to the urgent care at [facility] 1.5 weeks ago.  Has a bm about every other day but recently it has been more painful and required more straining.   No pain with bm.  No bleeding.  Feels like stool is more 'hard' lately. Bowel Problems predate his urologic surgery of last year.    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 Carles Collet.  Has had this issue for 15-20 years. Tremor is mostly in the hands.  Taks 4 tablets in am and 1 qhs.    Since he had prostate surgery in 2012 he had urinary leakage problems.  Had radiation treatments which resulted in worsening urinary incontinence, which led to him having the artificial sphincter surgery.   Takes zolpidem for insomnia.  Takes at least a 1/2 tab per night.   Pt complains of restlessness and burning in the feet at night when sleeping. Already takes gabapentin for other pain.    PMH: htn, hld, insomnia Still takes cholesterol and bp meds.   Had a colonsocopy 3 years.  [facility]  [facility]  On [facility].    PSH: lumbar spine ; prostate - 2012  FH: mi father and sister. Htn father and sister.   Tobacco use: never  Alcohol use: no Drug use: no Marital status: married: [name] Employment: retired  Screenings:  Colon Cancer: had a colonoscopy 3 years ago with [facility].     Outpatient Encounter Medications as of 05/20/2022  Medication Sig   acetaminophen (TYLENOL) 325 MG tablet Take 325 mg by mouth every 6 (six) hours as needed for mild pain, moderate pain, fever or headache.   amLODipine (NORVASC) 2.5 MG tablet Take 1 tablet (2.5 mg total) by mouth daily.   ascorbic acid (VITAMIN C) 1000 MG tablet Take 800 mg by mouth daily. Pt takes 5 times a week   gabapentin (NEURONTIN) 300 MG capsule Take 600 mg by mouth daily. Pt takes 2 tablets 600 mg once a day.   LORazepam (ATIVAN) 0.5 MG tablet Take 0.5 mg by mouth as directed. For Anxiety   Magnesium Oxide -Mg Supplement 200 MG TABS Take 1 tablet (200 mg total) by mouth in the morning and at bedtime.   Probiotic CHEW Chew by mouth. 2 chewable daily   rosuvastatin (CRESTOR) 10 MG tablet Take 1 tablet (10 mg total) by mouth daily.   zolpidem (AMBIEN CR)  12.5 MG CR tablet Take by mouth at bedtime. 1/2 to 1 tablet daily at bedtime   [DISCONTINUED] losartan (COZAAR) 100 MG tablet Take 1 tablet (100 mg total) by mouth daily.   [DISCONTINUED] primidone (MYSOLINE) 50 MG tablet 5 in the AM, 1 in the evening (Patient not taking: Reported on 05/20/2022)   [DISCONTINUED] simethicone (MYLICON) 80 MG chewable tablet Chew 180 mg by mouth every 6 (six) hours as needed for flatulence. (Patient not taking: Reported on 05/20/2022)   Multiple Vitamin (MULTIVITAMIN WITH MINERALS) TABS tablet Take 1 tablet by mouth 2 (two) times a week.   No facility-administered encounter medications on file as of 05/20/2022.    Past Medical History:  Diagnosis Date   Arthritis    hands    Cancer ([place])    prostate cancer    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   Hyperlipidemia     Hypertension    Prostate cancer ([place])    Recurrent upper respiratory infection (URI)    pt had a cold recent- week ago - better now    Tremor     Past Surgical History:  Procedure Laterality Date   CATARACT EXTRACTION Bilateral    LUMBAR LAMINECTOMY/DECOMPRESSION MICRODISCECTOMY Left 04/01/2018   Procedure: Left Lumbar four-lumbar five Microdiscectomy;  Surgeon: Erline Levine, MD;  Location: [facility];  Service: Neurosurgery;  Laterality: Left;   OTHER SURGICAL HISTORY  2005   right hand middle finger surgery    REPAIR OF CEREBROSPINAL FLUID LEAK N/A 05/18/2018   Procedure: Exploration of Lumbar four-five with repair of cerebrospinal fluid leak;  Surgeon: Erline Levine, MD;  Location: [facility];  Service: Neurosurgery;  Laterality: N/A;   ROBOT ASSISTED LAPAROSCOPIC RADICAL PROSTATECTOMY  01/13/2011   Procedure: ROBOTIC ASSISTED LAPAROSCOPIC RADICAL PROSTATECTOMY LEVEL 2;  Surgeon: Dutch Gray, MD;  Location: [facility];  Service: Urology;  Laterality: Bilateral;  Robotic Assisted Laparoscopic Prostatetectomy with Bilateral Lymphadenectomy  Level 2    Family History  Problem Relation Age of Onset   Heart attack Father    Hypertension Father    Cancer Mother        cervical with mets to lung   Hypertension Sister    Healthy Child     Social History   Socioeconomic History   Marital status: Married    Spouse name: Not on file   Number of children: 2   Years of education: Bachelors   Highest education level: Bachelor's degree (e.g., BA, AB, BS)  Occupational History   Occupation: Retired    Comment: Chief Financial Officer  Tobacco Use   Smoking status: Never   Smokeless tobacco: Never  Vaping Use   Vaping Use: Never used  Substance and Sexual Activity   Alcohol use: Not Currently    Comment: 1 drink per day or less   Drug use: No   Sexual activity: Never  Other Topics Concern   Not on file  Social History Narrative   Lives at home with his wife.   Right-handed.   No caffeine use.   Social  Determinants of Health   Financial Resource Strain: Not on file  Food Insecurity: Not on file  Transportation Needs: Not on file  Physical Activity: Not on file  Stress: Not on file  Social Connections: Not on file  Intimate Partner Violence: Not on file    ROS      Objective    BP 110/67   Pulse 63   Ht 5\' 11"  (1.803 m)   Wt 173 lb (78.5 kg)  SpO2 98% Comment: on RA  BMI 24.13 kg/m   Physical Exam Gen: alert, oriented. Appears stated age.  Heent: perrla, eomi Cv: rrr Pulm: lctab Gi: soft, nontneder.  Msk: strength equal b/l Neuro: no focal deficits.        Assessment & Plan:   Problem List Items Addressed This Visit       Nervous and Auditory   Essential tremor    Takes primidone 4tabs in am, 1 tab at night. Followed by neurologist, Dr. Carles Collet        Other   Constipation - Primary    Advised to use miralax or metamucil w/ goal of 1 soft bm/day.  Recent colonscopy in the past 3 years.  Will get records from [place].       Insomnia    Continue zolpidem.       Restless leg    Trial of magnesium supplementation.  Can consider ropinerole at next visit.       Urinary incontinence    Secondary to prostate cancer treatment.  Improved significantly after artificial urinary sphincter surgery, but has subsequently developed groin pain.        Return in about 3 weeks (around 06/10/2022) for constipation, rls.   Benay Pike, MD

## 2022-05-20 NOTE — Assessment & Plan Note (Signed)
Trial of magnesium supplementation.  Can consider ropinerole at next visit.

## 2022-05-20 NOTE — Patient Instructions (Signed)
It was nice to meet you today,   I would like you to come back in 3-4 weeks to talk about your restless legs and your constipation.    I have prescribed magnesium supplementation for you to take until then to see if this helps your restlessness.    I would like you to take miralax daily with the goal of having one soft bowel movement per day.  Increase the dose every 2 days until you achieve the desired result.  If this does not help you can switch miralax for over tthe counter metamucil, which is a fiber supplement.   I will get records from [facility] physicians.    Have a great day,   Dr. Jeannine Kitten

## 2022-05-20 NOTE — Assessment & Plan Note (Signed)
Takes primidone 4tabs in am, 1 tab at night. Followed by neurologist, Dr. Carles Collet

## 2022-05-20 NOTE — Assessment & Plan Note (Signed)
Advised to use miralax or metamucil w/ goal of 1 soft bm/day.  Recent colonscopy in the past 3 years.  Will get records from [place].

## 2022-05-20 NOTE — Assessment & Plan Note (Signed)
Secondary to prostate cancer treatment.  Improved significantly after artificial urinary sphincter surgery, but has subsequently developed groin pain.

## 2022-05-20 NOTE — Patient Instructions (Signed)
Medication Instructions:  Your physician recommends that you continue on your current medications as directed. Please refer to the Current Medication list given to you today. *If you need a refill on your cardiac medications before your next appointment, please call your pharmacy*   Lab Work: None Ordered   Testing/Procedures: None ordered   Follow-Up: At [facility], you and your health needs are our priority.  As part of our continuing mission to provide you with exceptional heart care, we have created designated Provider Care Teams.  These Care Teams include your primary Cardiologist (physician) and Advanced Practice Providers (APPs -  Physician Assistants and Nurse Practitioners) who all work together to provide you with the care you need, when you need it.  We recommend signing up for the patient portal called "MyChart".  Sign up information is provided on this After Visit Summary.  MyChart is used to connect with patients for Virtual Visits (Telemedicine).  Patients are able to view lab/test results, encounter notes, upcoming appointments, etc.  Non-urgent messages can be sent to your provider as well.   To learn more about what you can do with MyChart, go to [email or web address].    Your next appointment:   9 month(s)  Provider:   Dorris Carnes, MD     Other Instructions

## 2022-05-25 ENCOUNTER — Other Ambulatory Visit: Payer: Self-pay | Admitting: Neurology

## 2022-05-25 DIAGNOSIS — G250 Essential tremor: Secondary | ICD-10-CM

## 2022-06-10 ENCOUNTER — Encounter: Payer: Self-pay | Admitting: Family Medicine

## 2022-06-10 ENCOUNTER — Ambulatory Visit (INDEPENDENT_AMBULATORY_CARE_PROVIDER_SITE_OTHER): Payer: Medicare Other | Admitting: Family Medicine

## 2022-06-10 VITALS — BP 135/73 | HR 63 | Ht 71.0 in | Wt 177.0 lb

## 2022-06-10 DIAGNOSIS — K5909 Other constipation: Secondary | ICD-10-CM

## 2022-06-10 DIAGNOSIS — G629 Polyneuropathy, unspecified: Secondary | ICD-10-CM

## 2022-06-10 DIAGNOSIS — G2581 Restless legs syndrome: Secondary | ICD-10-CM

## 2022-06-10 NOTE — Progress Notes (Unsigned)
   Established Patient Office Visit  Subjective   Patient ID: [name], male    DOB: 1945-07-17  Age: 77 y.o. MRN: [id number]  Chief Complaint  Patient presents with   Constipation        restless leg syndrome     Constipation-patient has been having him proved issues with constipation but is now having issues with going too much or having leakage when he passes gas.  Patient is stating he has more issues with gas recently.  He will take a break from using his MiraLAX every 2 to 3 days.  He has not yet looked into Metamucil.  He would like to see a gastroenterologist regarding his bowel movement issues.  Patient thinks he had a colonoscopy 5 years ago  Patient says that the magnesium has been helping with his feelings of restless leg at night.  We discussed how magnesium can sometimes worsen diarrhea switching to only taking it once a day at night.  If it still controls his restless legs we can keep it at 1 time a day but if needed to he can go back to twice a day.  We discussed treatment such as propranolol if magnesium no longer becomes effective or tolerable.  Patient taking 600 mg, 600 mg, 300 mg of gabapentin for his neuropathy.   {History (Optional):23778}  ROS    Objective:     BP 135/73   Pulse 63   Ht 5\' 11"  (1.803 m)   Wt 177 lb (80.3 kg)   SpO2 96% Comment: on RA  BMI 24.69 kg/m  {Vitals History (Optional):23777}  Physical Exam General alert, oriented Pulmonary: No respiratory distress Psych: Pleasant affect.  No results found for any visits on 06/10/22.  {Labs (Optional):23779}  The ASCVD Risk score (Arnett DK, et al., 2019) failed to calculate for the following reasons:   The valid total cholesterol range is 130 to 320 mg/dL    Assessment & Plan:   Problem List Items Addressed This Visit       Other   Restless leg - Primary    Magnesium twice a day has been helping with his symptoms, although may be contributing to his bowel complaints of  increased frequency and mild fecal incontinence.  Wanted to evaluate patient further with standard guideline directed evaluation of RLS as recommended by the International RLS study group  with iron level testing, B12, and checking magnesium level given that he is on magnesium currently.  Incomprehensibly, Medicare does not consider these necessary in the workup of restless leg syndrome and therefore patient would have had to pay multiple $100 out-of-pocket for these tests.  Therefore we did not get them today.      Other Visit Diagnoses     Neuropathy       Relevant Orders   Iron, TIBC and Ferritin Panel   B12 and Folate Panel   Magnesium       Return in about 2 months (around 08/10/2022) for constipation, restless leg.    Sandre Kitty, MD

## 2022-06-10 NOTE — Assessment & Plan Note (Signed)
Magnesium twice a day has been helping with his symptoms, although may be contributing to his bowel complaints of increased frequency and mild fecal incontinence.  Wanted to evaluate patient further with standard guideline directed evaluation of RLS as recommended by the International RLS study group  with iron level testing, B12, and checking magnesium level given that he is on magnesium currently.  Incomprehensibly, Medicare does not consider these necessary in the workup of restless leg syndrome and therefore patient would have had to pay multiple $100 out-of-pocket for these tests.  Therefore we did not get them today.

## 2022-06-10 NOTE — Patient Instructions (Signed)
You can cut back your magnesium to night time only.  This may help with having to use the bathroom so often.    For gas you can try over the counter simethicone.    I will put in a referral to the gastroenterologist.    I will call you with the results of your lab tests when I get them.   Have a great day,   Dr. Constance Goltz

## 2022-06-11 NOTE — Assessment & Plan Note (Signed)
Constipation improved but now having episodes of incontinence when passing gas and increased frequency of BM.  Recommended reducing magnesium to one time a day, advised trying metamucil to bulk stools. Recommended simethicone for gas.  Pt expresses desire to have gastroenterologist evaluate him.  Will send in referral to gi

## 2022-06-17 ENCOUNTER — Telehealth: Payer: Self-pay

## 2022-06-17 NOTE — Telephone Encounter (Signed)
The referral was received, they'd just like to know what provider the referral is for that the patient will be seeing.

## 2022-06-17 NOTE — Telephone Encounter (Signed)
Patient called office concerning his referral for GASTROENTEROLOGY, patient states that he called to get status of referral and to make appointment, but was told that he needs to provider that the referral was sent for, please advise, thanks.

## 2022-06-18 DIAGNOSIS — G894 Chronic pain syndrome: Secondary | ICD-10-CM | POA: Diagnosis not present

## 2022-06-18 DIAGNOSIS — G588 Other specified mononeuropathies: Secondary | ICD-10-CM | POA: Diagnosis not present

## 2022-06-19 ENCOUNTER — Encounter: Payer: Self-pay | Admitting: Nurse Practitioner

## 2022-06-24 NOTE — Telephone Encounter (Signed)
Confirmed with pt that he has been in contact with GI.

## 2022-07-07 DIAGNOSIS — L570 Actinic keratosis: Secondary | ICD-10-CM | POA: Diagnosis not present

## 2022-07-07 DIAGNOSIS — D1801 Hemangioma of skin and subcutaneous tissue: Secondary | ICD-10-CM | POA: Diagnosis not present

## 2022-07-07 DIAGNOSIS — Z85820 Personal history of malignant melanoma of skin: Secondary | ICD-10-CM | POA: Diagnosis not present

## 2022-07-07 DIAGNOSIS — L821 Other seborrheic keratosis: Secondary | ICD-10-CM | POA: Diagnosis not present

## 2022-07-07 DIAGNOSIS — L853 Xerosis cutis: Secondary | ICD-10-CM | POA: Diagnosis not present

## 2022-07-07 DIAGNOSIS — Z85828 Personal history of other malignant neoplasm of skin: Secondary | ICD-10-CM | POA: Diagnosis not present

## 2022-07-09 ENCOUNTER — Other Ambulatory Visit: Payer: Self-pay | Admitting: Family Medicine

## 2022-07-10 ENCOUNTER — Telehealth: Payer: Self-pay | Admitting: Neurology

## 2022-07-10 DIAGNOSIS — R102 Pelvic and perineal pain: Secondary | ICD-10-CM | POA: Diagnosis not present

## 2022-07-10 DIAGNOSIS — N281 Cyst of kidney, acquired: Secondary | ICD-10-CM | POA: Diagnosis not present

## 2022-07-10 DIAGNOSIS — K5909 Other constipation: Secondary | ICD-10-CM | POA: Diagnosis not present

## 2022-07-10 DIAGNOSIS — C61 Malignant neoplasm of prostate: Secondary | ICD-10-CM | POA: Diagnosis not present

## 2022-07-10 DIAGNOSIS — N393 Stress incontinence (female) (male): Secondary | ICD-10-CM | POA: Diagnosis not present

## 2022-07-10 DIAGNOSIS — N5231 Erectile dysfunction following radical prostatectomy: Secondary | ICD-10-CM | POA: Diagnosis not present

## 2022-07-10 NOTE — Telephone Encounter (Signed)
Pt called in wanting to see if he can get some advice from Dr. Arbutus Leas. He has been having nerve pain around his rectum and scrotum. It's been going on for 6 months since he had an AUS implanted. He is hoping he could get a referral to get an MRN done? He is willing to travel if they are not done around here. He also is wondering if she knows if there is another doctor that specializes in treating pelvic neuropathy?

## 2022-07-11 DIAGNOSIS — R278 Other lack of coordination: Secondary | ICD-10-CM | POA: Diagnosis not present

## 2022-07-11 DIAGNOSIS — K599 Functional intestinal disorder, unspecified: Secondary | ICD-10-CM | POA: Diagnosis not present

## 2022-07-11 DIAGNOSIS — R198 Other specified symptoms and signs involving the digestive system and abdomen: Secondary | ICD-10-CM | POA: Diagnosis not present

## 2022-07-11 DIAGNOSIS — R102 Pelvic and perineal pain: Secondary | ICD-10-CM | POA: Diagnosis not present

## 2022-07-11 NOTE — Telephone Encounter (Signed)
Called patient back and gave Dr. Iona Beard recommendations. I also looked up a couple of places that may be able to help him and gave him those numbers

## 2022-07-15 NOTE — Progress Notes (Addendum)
07/17/2022 [name] [id number] 07-Jun-1945  Referring provider: Sandre Kitty, MD Primary GI doctor: Dr. Myrtie Neither  ASSESSMENT AND PLAN:   Normocytic anemia Will get iron, ferritin, B12 to evaluate Denies melena or hematochezia, negative FOBT on exam today Will get previous colonoscopy report from [facility] Pending these results will schedule endoscopic evaluation if needed but patient reports unremarkable colonoscopy the last time.  ADDENDUM: 06/27/2008 colonoscopy with Dr. Madilyn Fireman at [facility] endoscopy good bowel prep, small internal hemorrhoids otherwise unremarkable recall 10 years. 10/26/2018 repeat colonoscopy Dr. Emelia Salisbury good bowel prep 3 mm polyp in the rectum internal hemorrhoids medium sized Pathology showed random biopsies negative for microscopic colitis, polyp was hyperplastic no recall due to age.  constipation, fecal smearing, anal/perineal discomfort History of prostate cancer status post prostatectomy 2012 with worsening urinary and rectal issues since that time. July 2023 had artificial urinary sphincter placed and has had persistent perianal discomfort since that time with associated  Rectal exam with markedly decreased rectal tone with abundance of soft stool in the rectum Most likely this represents pelvic floor dysfunction/decreased rectal tone from nerve block Will get x-ray to evaluate stool burden, consider MiraLAX purge versus prep if significant Continue fiber, encourage patient to follow-up with pelvic floor physical therapy, get squatty potty Get previous colonoscopy report, can consider anal manometry but uncertain if this would be beneficial at this time Consider treating hemorrhoids for rectal pain if the above treatments do not help, no visible fissure.   Patient Care Team: Sandre Kitty, MD as PCP - General (Family Medicine) Pricilla Riffle, MD as PCP - Cardiology (Cardiology) Tat, Octaviano Batty, DO as Consulting Physician (Neurology)  HISTORY OF  PRESENT ILLNESS: 77 y.o. male with a past medical history of hypertension, hyperlipidemia, history of prostate cancer status post prostatectomy 12/2010 and others listed below presents for evaluation of fecal incontinence.   Possible colonoscopy in [facility] 2021- uncertain timing, will get records, patient states he was told he did not need another.   Patient has history of prostate cancer status post prostatectomy 2012. Follows with [facility] pelvic and perineal pain. [facility] only [facility]  Status post artificial urinary sphincter placement July 2023 Status post nerve blocks with pain specialist Dr. Cleopatra Cedar but states these did not help and it actually caused rectal numbing and lack of sensation, still does not feel normal.  Prior to the urinary sphincter repair he would have BM daily, incomplete BM with rare fecal incontinence, smear and hard to clean.  Since the surgery he would have BM every other day or every 2-3 days,  Has strong urge/pressure to have BM but just small volume gas. Will have small volume and then within 15 mins will have pressure/urge again.  He started on metamucil and this has helped regulate him and he has not had any further fecal incontinence.  States better than 6 weeks ago when made the referral.  No hematochezia.   03/06/2020 CT abdomen pelvis with contrast due to history of metastatic prostate cancer status post radiation restaging showed no findings to suggest metastatic disease in chest abdomen or pelvis.  Tiny 2 to 3 mm nodule stable considered benign.  Three-vessel CAD and aortic atherosclerosis.  Several tiny subcentimeter low-attenuation lesions in the liver too small to characterize stability likely small cyst or biliary hamartomas.  No ductal dilation.  Gallbladder decompressed 03/20/2022 patient with normocytic anemia, hemoglobin 12.4 05/08/2022 normal kidney, normal liver. 05/09/2022 ER  visit for constipation.  Patient status  post artificial urinary sphincter placement July 2023.  Has chronic groin and rectal pain that they follow with urology and sees pelvic floor physical therapy.  He  reports that he has never smoked. He has never used smokeless tobacco. He reports that he does not currently use alcohol. He reports that he does not use drugs.  RELEVANT LABS AND IMAGING: CBC    Component Value Date/Time   WBC 4.7 03/20/2022 1001   WBC 7.1 05/18/2018 0934   RBC 4.04 (L) 03/20/2022 1001   HGB 12.4 (L) 03/20/2022 1001   HGB 13.0 12/19/2021 1035   HGB 12.9 (L) 01/06/2017 0820   HCT 37.0 (L) 03/20/2022 1001   HCT 39.4 12/19/2021 1035   HCT 38.4 01/06/2017 0820   PLT 205 03/20/2022 1001   PLT 174 12/19/2021 1035   MCV 91.6 03/20/2022 1001   MCV 90 12/19/2021 1035   MCV 90.7 01/06/2017 0820   MCH 30.7 03/20/2022 1001   MCHC 33.5 03/20/2022 1001   RDW 13.0 03/20/2022 1001   RDW 13.0 12/19/2021 1035   RDW 13.3 01/06/2017 0820   LYMPHSABS 1.1 03/20/2022 1001   LYMPHSABS 0.8 (L) 01/06/2017 0820   MONOABS 0.5 03/20/2022 1001   MONOABS 0.4 01/06/2017 0820   EOSABS 0.2 03/20/2022 1001   EOSABS 0.2 01/06/2017 0820   BASOSABS 0.1 03/20/2022 1001   BASOSABS 0.1 01/06/2017 0820   Recent Labs    09/18/21 1123 12/19/21 1035 03/20/22 1001  HGB 12.8* 13.0 12.4*    CMP     Component Value Date/Time   NA 141 05/08/2022 1158   NA 141 01/06/2017 0820   K 4.3 05/08/2022 1158   K 4.2 01/06/2017 0820   CL 104 05/08/2022 1158   CO2 23 05/08/2022 1158   CO2 26 01/06/2017 0820   GLUCOSE 87 05/08/2022 1158   GLUCOSE 88 03/20/2022 1001   GLUCOSE 97 01/06/2017 0820   BUN 19 05/08/2022 1158   BUN 28.9 (H) 01/06/2017 0820   CREATININE 0.90 05/08/2022 1158   CREATININE 0.87 03/20/2022 1001   CREATININE 1.1 01/06/2017 0820   CALCIUM 9.4 05/08/2022 1158   CALCIUM 9.7 01/06/2017 0820   PROT 7.2 03/20/2022 1001   PROT 6.7 07/13/2019 1020   PROT 7.1 01/06/2017 0820   ALBUMIN 4.1 03/20/2022 1001   ALBUMIN 4.4  07/13/2019 1020   ALBUMIN 4.0 01/06/2017 0820   AST 16 03/20/2022 1001   AST 19 01/06/2017 0820   ALT 10 03/20/2022 1001   ALT 16 01/06/2017 0820   ALKPHOS 50 03/20/2022 1001   ALKPHOS 41 01/06/2017 0820   BILITOT 0.4 03/20/2022 1001   BILITOT 0.43 01/06/2017 0820   GFRNONAA >60 03/20/2022 1001   GFRAA >60 04/29/2019 0904      Latest Ref Rng & Units 03/20/2022   10:01 AM 09/18/2021   11:23 AM 03/13/2021   11:51 AM  Hepatic Function  Total Protein 6.5 - 8.1 g/dL 7.2  7.1  7.8   Albumin 3.5 - 5.0 g/dL 4.1  4.3  4.4   AST 15 - 41 U/L 16  17  17    ALT 0 - 44 U/L 10  12  13    Alk Phosphatase 38 - 126 U/L 50  41  66   Total Bilirubin 0.3 - 1.2 mg/dL 0.4  0.4  0.3       Current Medications:    Current Outpatient Medications (Cardiovascular):    amLODipine (NORVASC) 2.5 MG tablet, Take 1 tablet (  2.5 mg total) by mouth daily.   losartan (COZAAR) 100 MG tablet, Take 1 tablet (100 mg total) by mouth daily.   rosuvastatin (CRESTOR) 10 MG tablet, Take 1 tablet (10 mg total) by mouth daily.   Current Outpatient Medications (Analgesics):    acetaminophen (TYLENOL) 325 MG tablet, Take 325 mg by mouth every 6 (six) hours as needed for mild pain, moderate pain, fever or headache.   Current Outpatient Medications (Other):    ascorbic acid (VITAMIN C) 1000 MG tablet, Take 800 mg by mouth daily. Pt takes 5 times a week   gabapentin (NEURONTIN) 300 MG capsule, Take 600 mg by mouth 3 (three) times daily.   LORazepam (ATIVAN) 0.5 MG tablet, Take 0.5 mg by mouth as directed. For Anxiety   Magnesium Oxide -Mg Supplement 200 MG TABS, TAKE 1 TABLET BY MOUTH IN THE MORNING AND AT BEDTIME   Multiple Vitamin (MULTIVITAMIN WITH MINERALS) TABS tablet, Take 1 tablet by mouth 2 (two) times a week.   primidone (MYSOLINE) 50 MG tablet, Pt. Takes 5 tablets 100 mg in the AM, and 1 tablet 50 mg in the PM   psyllium (REGULOID) 0.52 g capsule, Take 0.52 g by mouth daily. 2 capsules by mouth twice daily    zolpidem (AMBIEN CR) 12.5 MG CR tablet, Take by mouth at bedtime. 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   Meloxicam     Other reaction(s): rash?... Able to tolerate Aleve without problems   Tape Other (See Comments) and Rash    Surgical tape=redness/rash at site     Surgical History:  He  has a past surgical history that includes Other surgical history (2005); Robot assisted laparoscopic radical prostatectomy (01/13/2011); Cataract extraction (Bilateral); Lumbar laminectomy/decompression microdiscectomy (Left, 04/01/2018); and Repair of cerebrospinal fluid leak (N/A, 05/18/2018). Family History:  His family history includes Cancer in his mother; Healthy in his child; Heart attack in his father; Hypertension in his father and sister.  REVIEW OF SYSTEMS  : All other systems reviewed and negative except where noted in the History of Present Illness.  PHYSICAL EXAM: BP (!) 142/68   Pulse 65   Ht 5\' 10"  (1.778 m)   Wt 176 lb (79.8 kg)   BMI 25.25 kg/m  General Appearance: Well nourished, in no apparent distress. Head:   Normocephalic and atraumatic. Eyes:  sclerae anicteric,conjunctive pink  Respiratory: Respiratory effort normal, BS equal bilaterally without rales, rhonchi, wheezing. Cardio: RRR with no MRGs. Peripheral pulses intact.  Abdomen: Soft,  Obese ,active bowel sounds. No tenderness . Without guarding and Without rebound. No masses. Rectal: External rectal exam without masses, hemorrhoids, obvious decreased rectal tone with slight  internal hemorrhoids appreciated, cavernous rectum with abundance of soft stool brown, Hemoccult negative Musculoskeletal: Full ROM, Normal gait. Without edema. Skin:  Dry and intact without significant lesions or rashes Neuro: Alert and  oriented x4;  No focal deficits. Psych:  Cooperative. Normal mood and affect.  Doree Albee, PA-C 11:38 AM

## 2022-07-17 ENCOUNTER — Other Ambulatory Visit (INDEPENDENT_AMBULATORY_CARE_PROVIDER_SITE_OTHER): Payer: Medicare Other

## 2022-07-17 ENCOUNTER — Encounter: Payer: Self-pay | Admitting: Physician Assistant

## 2022-07-17 ENCOUNTER — Ambulatory Visit (INDEPENDENT_AMBULATORY_CARE_PROVIDER_SITE_OTHER): Payer: Medicare Other | Admitting: Physician Assistant

## 2022-07-17 ENCOUNTER — Ambulatory Visit (INDEPENDENT_AMBULATORY_CARE_PROVIDER_SITE_OTHER)
Admission: RE | Admit: 2022-07-17 | Discharge: 2022-07-17 | Disposition: A | Discharge status: Home / Self Care | Payer: Medicare Other | Source: Ambulatory Visit | Attending: Physician Assistant | Admitting: Physician Assistant

## 2022-07-17 VITALS — BP 142/68 | HR 65 | Ht 70.0 in | Wt 176.0 lb

## 2022-07-17 DIAGNOSIS — K5904 Chronic idiopathic constipation: Secondary | ICD-10-CM | POA: Diagnosis not present

## 2022-07-17 DIAGNOSIS — R151 Fecal smearing: Secondary | ICD-10-CM | POA: Diagnosis not present

## 2022-07-17 DIAGNOSIS — Z9079 Acquired absence of other genital organ(s): Secondary | ICD-10-CM

## 2022-07-17 DIAGNOSIS — D649 Anemia, unspecified: Secondary | ICD-10-CM

## 2022-07-17 DIAGNOSIS — E538 Deficiency of other specified B group vitamins: Secondary | ICD-10-CM | POA: Diagnosis not present

## 2022-07-17 DIAGNOSIS — K5900 Constipation, unspecified: Secondary | ICD-10-CM | POA: Diagnosis not present

## 2022-07-17 LAB — CBC WITH DIFFERENTIAL/PLATELET
Basophils Absolute: 0.1 10*3/uL (ref 0.0–0.1)
Basophils Relative: 1.3 % (ref 0.0–3.0)
Eosinophils Absolute: 0.2 10*3/uL (ref 0.0–0.7)
Eosinophils Relative: 3.9 % (ref 0.0–5.0)
HCT: 39.5 % (ref 39.0–52.0)
Hemoglobin: 12.9 g/dL — ABNORMAL LOW (ref 13.0–17.0)
Lymphocytes Relative: 24.5 % (ref 12.0–46.0)
Lymphs Abs: 1.4 10*3/uL (ref 0.7–4.0)
MCHC: 32.7 g/dL (ref 30.0–36.0)
MCV: 88.6 fl (ref 78.0–100.0)
Monocytes Absolute: 0.7 10*3/uL (ref 0.1–1.0)
Monocytes Relative: 11.6 % (ref 3.0–12.0)
Neutro Abs: 3.3 10*3/uL (ref 1.4–7.7)
Neutrophils Relative %: 58.7 % (ref 43.0–77.0)
Platelets: 190 10*3/uL (ref 150.0–400.0)
RBC: 4.46 Mil/uL (ref 4.22–5.81)
RDW: 13.8 % (ref 11.5–15.5)
WBC: 5.7 10*3/uL (ref 4.0–10.5)

## 2022-07-17 LAB — IBC + FERRITIN
Ferritin: 121.8 ng/mL (ref 22.0–322.0)
Iron: 100 ug/dL (ref 42–165)
Saturation Ratios: 32.2 % (ref 20.0–50.0)
TIBC: 310.8 ug/dL (ref 250.0–450.0)
Transferrin: 222 mg/dL (ref 212.0–360.0)

## 2022-07-17 LAB — VITAMIN B12: Vitamin B-12: 254 pg/mL (ref 211–911)

## 2022-07-17 NOTE — Progress Notes (Signed)
____________________________________________________________  Attending physician addendum:  Thank you for sending this case to me. I have reviewed the entire note and agree with the plan.   Amrita Radu Danis, MD  ____________________________________________________________  

## 2022-07-17 NOTE — Patient Instructions (Addendum)
Your provider has requested that you have an abdominal x ray before leaving today. Please go to the basement floor to our Radiology department for the test.   Your provider has requested that you go to the basement level for lab work before leaving today. Press "B" on the elevator. The lab is located at the first door on the left as you exit the elevator.   Here some information about pelvic floor dysfunction. Continue fiber, try for as much as you can tolerate, can try to increase to 3 x a day.  Continue follow up  pelvic floor physical therapy.  Toileting tips to help with your constipation - Drink at least 64-80 ounces of water/liquid per day. - Establish a time to try to move your bowels every day.  For many people, this is after a cup of coffee or after a meal such as breakfast. - Sit all of the way back on the toilet keeping your back fairly straight and while sitting up, try to rest the tops of your forearms on your upper thighs.   - Raising your feet with a step stool/squatty potty can be helpful to improve the angle that allows your stool to pass through the rectum. - Relax the rectum feeling it bulge toward the toilet water.  If you feel your rectum raising toward your body, you are contracting rather than relaxing. - Breathe in and slowly exhale. "Belly breath" by expanding your belly towards your belly button. Keep belly expanded as you gently direct pressure down and back to the anus.  A low pitched GRRR sound can assist with increasing intra-abdominal pressure.  - Repeat 3-4 times. If unsuccessful, contract the pelvic floor to restore normal tone and get off the toilet.  Avoid excessive straining. - To reduce excessive wiping by teaching your anus to normally contract, place hands on outer aspect of knees and resist knee movement outward.  Hold 5-10 second then place hands just inside of knees and resist inward movement of knees.  Hold 5 seconds.  Repeat a few times each  way.  Pelvic Floor Dysfunction, Male     Pelvic floor dysfunction (PFD) is a condition that results when the group of muscles and connective tissues that support the organs in the pelvis (pelvic floor muscles) do not work well. These muscles and their connections form a sling that supports the colon and bladder. In men, these muscles also support the prostate gland. PFD causes pelvic floor muscles to be too weak, too tight, or both. In PFD, muscle movements are not coordinated. This may cause bowel or bladder problems. It may also cause pain. What are the causes? This condition may be caused by an injury to the pelvic area or by a weakening of pelvic muscles. In many cases, the exact cause is not known. What increases the risk? The following factors may make you more likely to develop PFD: Having chronic bladder tissue inflammation (interstitial cystitis). Being an older person. Being overweight. History of radiation treatment for cancer in the pelvic region. Previous pelvic surgery, such as removal of the prostate gland (prostatectomy). What are the signs or symptoms? Symptoms of this condition vary and may include: Bladder symptoms, such as: Trouble starting urination and emptying the bladder. Frequent urinary tract infections. Leaking urine when coughing, laughing, or exercising (stress incontinence). Having to pass urine urgently or frequently. Pain when passing urine. Bowel symptoms, such as: Constipation. Urgent or frequent bowel movements. Incomplete bowel movements. Painful bowel movements. Leaking stool or  gas. Unexplained genital or rectal pain. Genital or rectal muscle spasms. Low back pain. Sexual dysfunction, such as erectile dysfunction, premature ejaculation, or pain during or after sexual activity. How is this diagnosed? This condition is diagnosed based on: Your symptoms and medical history. A physical exam. During the exam, your health care provider may check  your pelvic muscles for tightness, spasm, pain, or weakness. This may include a rectal exam. In some cases, you may have diagnostic tests, such as: Electrical muscle function tests. Urine flow testing. X-ray tests of bowel function. Ultrasound of the pelvic organs. How is this treated? Treatment for this condition depends on your symptoms. Treatment options include: Physical therapy. This may include Kegel exercises to help relax or strengthen the pelvic floor muscles. Biofeedback. This type of therapy provides feedback on how tight your pelvic floor muscles are so that you can learn to control them. Massage therapy. A treatment that involves electrical stimulation of the pelvic floor muscles to help control pain (transcutaneous electrical nerve stimulation, or TENS). Sound wave therapy (ultrasound) to reduce muscle spasms. Medicines, such as: Muscle relaxants. Bladder control medicines. Surgery to reconstruct or support pelvic floor muscles may be an option if other treatments do not help. Follow these instructions at home: Activity Do your usual activities as told by your health care provider. Ask your health care provider if you should modify any activities. Do pelvic floor strengthening or relaxing exercises at home as told by your physical therapist. Lifestyle Maintain a healthy weight. Eat foods that are high in fiber, such as beans, whole grains, and fresh fruits and vegetables. Limit foods that are high in fat and processed sugars, such as fried or sweet foods. Manage stress with relaxation techniques such as yoga or meditation. General instructions If you have problems with leakage: Use absorbable pads or wear padded underwear. Wash your genital and anal area frequently with mild soap. Keep your genital and anal area as clean and dry as possible. Ask your health care provider if you should try a barrier cream to prevent skin irritation. Take warm baths to relieve pelvic  muscle tension or spasms. Take over-the-counter and prescription medicines only as told by your health care provider. Keep all follow-up visits. How is this prevented? The cause of PFD is not always known, but there are a few things you can do to reduce the risk of developing this condition, including: Staying at a healthy weight. Getting regular exercise. Managing stress. Contact a health care provider if: Your symptoms are not improving with home care. You have signs or symptoms of PFD that get worse. You develop new signs or symptoms. You have signs of a urinary tract infection, such as: Fever. Chills. Increased urinary frequency. A burning feeling when urinating. You have not had a bowel movement in 3 days (constipation). Summary Pelvic floor dysfunction results when the muscles and connective tissues in your pelvic floor do not work well. These muscles and their connections form a sling that supports your colon and bladder. In men, these muscles also support the prostate gland. PFD may be caused by an injury to the pelvic area or by a weakening of pelvic muscles. PFD causes pelvic floor muscles to be too weak, too tight, or a combination of both. Symptoms may vary from person to person. In most cases, PFD can be treated with physical therapies and medicines. Surgery may be an option if other treatments do not help. This information is not intended to replace advice given to you  by your health care provider. Make sure you discuss any questions you have with your health care provider. Document Revised: 06/20/2020 Document Reviewed: 06/20/2020 Elsevier Patient Education  2023 [employer]vier Inc. _______________________________________________________  If your blood pressure at your visit was 140/90 or greater, please contact your primary care physician to follow up on this.  _______________________________________________________  If you are age 57 or older, your body mass index should  be between 23-30. Your Body mass index is 25.25 kg/m. If this is out of the aforementioned range listed, please consider follow up with your Primary Care Provider.  If you are age 59 or younger, your body mass index should be between 19-25. Your Body mass index is 25.25 kg/m. If this is out of the aformentioned range listed, please consider follow up with your Primary Care Provider.   ________________________________________________________  The [facility] providers would like to encourage you to use [facility] to communicate with providers for non-urgent requests or questions.  Due to long hold times on the telephone, sending your provider a message by [facility] may be a faster and more efficient way to get a response.  Please allow 48 business hours for a response.  Please remember that this is for non-urgent requests.  _______________________________________________________ It was a pleasure to see you today!  Thank you for trusting me with your gastrointestinal care!

## 2022-07-25 ENCOUNTER — Telehealth: Payer: Self-pay | Admitting: Physician Assistant

## 2022-07-25 NOTE — Telephone Encounter (Signed)
Patient called back to discuss the lab results as well as the x-rays recently taken. Patient satisfied with information given, also instructed the patienet to ask questions involving the mixture given via the PA concerning the Miralax and Benefiber. Patient understood. Informed to call back with any further questions.

## 2022-07-25 NOTE — Telephone Encounter (Signed)
Inbound call from patient requesting to speak with a nurse in regards some lab results .Please advise

## 2022-08-06 ENCOUNTER — Ambulatory Visit: Payer: Medicare Other | Admitting: Physician Assistant

## 2022-08-22 ENCOUNTER — Ambulatory Visit: Payer: Medicare Other | Admitting: Nurse Practitioner

## 2022-08-29 DIAGNOSIS — R102 Pelvic and perineal pain: Secondary | ICD-10-CM | POA: Diagnosis not present

## 2022-08-29 DIAGNOSIS — K5909 Other constipation: Secondary | ICD-10-CM | POA: Diagnosis not present

## 2022-08-29 DIAGNOSIS — Z960 Presence of urogenital implants: Secondary | ICD-10-CM | POA: Diagnosis not present

## 2022-08-29 DIAGNOSIS — N393 Stress incontinence (female) (male): Secondary | ICD-10-CM | POA: Diagnosis not present

## 2022-08-29 DIAGNOSIS — N281 Cyst of kidney, acquired: Secondary | ICD-10-CM | POA: Diagnosis not present

## 2022-08-29 DIAGNOSIS — C61 Malignant neoplasm of prostate: Secondary | ICD-10-CM | POA: Diagnosis not present

## 2022-08-29 DIAGNOSIS — N5231 Erectile dysfunction following radical prostatectomy: Secondary | ICD-10-CM | POA: Diagnosis not present

## 2022-09-02 DIAGNOSIS — D649 Anemia, unspecified: Secondary | ICD-10-CM | POA: Diagnosis not present

## 2022-09-02 DIAGNOSIS — G250 Essential tremor: Secondary | ICD-10-CM | POA: Diagnosis not present

## 2022-09-02 DIAGNOSIS — F419 Anxiety disorder, unspecified: Secondary | ICD-10-CM | POA: Diagnosis not present

## 2022-09-02 DIAGNOSIS — R1319 Other dysphagia: Secondary | ICD-10-CM | POA: Diagnosis not present

## 2022-09-02 DIAGNOSIS — R32 Unspecified urinary incontinence: Secondary | ICD-10-CM | POA: Diagnosis not present

## 2022-09-02 DIAGNOSIS — Z8546 Personal history of malignant neoplasm of prostate: Secondary | ICD-10-CM | POA: Diagnosis not present

## 2022-09-02 DIAGNOSIS — E7800 Pure hypercholesterolemia, unspecified: Secondary | ICD-10-CM | POA: Diagnosis not present

## 2022-09-02 DIAGNOSIS — I10 Essential (primary) hypertension: Secondary | ICD-10-CM | POA: Diagnosis not present

## 2022-09-02 DIAGNOSIS — G4700 Insomnia, unspecified: Secondary | ICD-10-CM | POA: Diagnosis not present

## 2022-09-02 DIAGNOSIS — R102 Pelvic and perineal pain: Secondary | ICD-10-CM | POA: Diagnosis not present

## 2022-09-08 ENCOUNTER — Telehealth: Payer: Self-pay

## 2022-09-08 NOTE — Telephone Encounter (Signed)
Pt is requesting a referral for a Pain clinic dr. Rock Nephew said that he has saw 3 different providers to seek help for the pain in groin area.   Pt states that he is desperate.

## 2022-09-10 NOTE — Telephone Encounter (Signed)
I reached out to pt he confirmed that he completed the appt on 09/02/22 bc he already had it scheduled but he let the provider know that he had est care with Dr. Constance Goltz and it has been documented in his note from 09/02/22 As a result, he has decided to establish with a primary care provider closer to his home setting where they also have an urgent care. He is going to be establishing with Dr. Lenor Coffin for that purpose.  He expresses gratitude for the care I have provided him over the years.  He has had a few visits with Dr. Constance Goltz already and has a recent CBC lab work from 07/17/2022 with hemoglobin of 12.9.  Pt will continue his care here at [facility].

## 2022-09-18 DIAGNOSIS — N3946 Mixed incontinence: Secondary | ICD-10-CM | POA: Diagnosis not present

## 2022-09-18 DIAGNOSIS — N3942 Incontinence without sensory awareness: Secondary | ICD-10-CM | POA: Diagnosis not present

## 2022-09-18 NOTE — Telephone Encounter (Signed)
Pt was returning your calling.

## 2022-09-19 ENCOUNTER — Telehealth: Payer: Self-pay | Admitting: Family Medicine

## 2022-09-19 ENCOUNTER — Other Ambulatory Visit: Payer: Self-pay | Admitting: Family Medicine

## 2022-09-19 NOTE — Progress Notes (Signed)
   Spoke with Mr. Melka regarding his pain.  Patient is still having pain in the  area between his scrotum and rectum.  He has so far gone to a interventional radiologist who performed a pudendal nerve block on 2 separate occasions.  Patient thinks that the first time made symptoms worse, and second time may have helped a little bit.  He also has gone to a pain doctor at [facility], Dr. Lorrine Kin.  He was given gabapentin.  This helped somewhat but is starting to wear off.  Still has mostly pain when he is sitting.  We discussed alternative treatment options.  Interventional radiologist who performed his block is no longer practicing in this state.  Patient would like to go to somebody local.  I told him I would look into the appropriate referral for this.  May be best to send him to physical medicine rehabilitation for nerve block.  Could consider topical capsaicin.  Small study of capsaicin patch showed improvement in some patients.  Other alternative includes dry needling or acupuncture.  Acupuncture has been shown to be beneficial in chronic pelvic pain.  Use of a TENS unit has also been effective in some patients.  I am seeing the patient on August 5.  I will do a more thorough exam at that time prior to making any suggestions.  Medicare does cover acupuncture treatments.  For consideration of TENS unit, might be beneficial to refer patient to physical therapy that specializes in pelvic floor.

## 2022-09-22 NOTE — Telephone Encounter (Signed)
error 

## 2022-09-23 ENCOUNTER — Telehealth: Payer: Self-pay | Admitting: Hematology and Oncology

## 2022-09-24 ENCOUNTER — Other Ambulatory Visit: Payer: Self-pay

## 2022-09-24 ENCOUNTER — Inpatient Hospital Stay: Payer: Medicare Other | Attending: Hematology and Oncology

## 2022-09-24 DIAGNOSIS — C61 Malignant neoplasm of prostate: Secondary | ICD-10-CM | POA: Diagnosis not present

## 2022-09-24 DIAGNOSIS — Z79899 Other long term (current) drug therapy: Secondary | ICD-10-CM | POA: Diagnosis not present

## 2022-09-24 LAB — CBC WITH DIFFERENTIAL (CANCER CENTER ONLY)
Abs Immature Granulocytes: 0.01 10*3/uL (ref 0.00–0.07)
Basophils Absolute: 0.1 10*3/uL (ref 0.0–0.1)
Basophils Relative: 2 %
Eosinophils Absolute: 0.1 10*3/uL (ref 0.0–0.5)
Eosinophils Relative: 4 %
HCT: 36.8 % — ABNORMAL LOW (ref 39.0–52.0)
Hemoglobin: 12.4 g/dL — ABNORMAL LOW (ref 13.0–17.0)
Immature Granulocytes: 0 %
Lymphocytes Relative: 25 %
Lymphs Abs: 1 10*3/uL (ref 0.7–4.0)
MCH: 29.7 pg (ref 26.0–34.0)
MCHC: 33.7 g/dL (ref 30.0–36.0)
MCV: 88.2 fL (ref 80.0–100.0)
Monocytes Absolute: 0.3 10*3/uL (ref 0.1–1.0)
Monocytes Relative: 7 %
Neutro Abs: 2.5 10*3/uL (ref 1.7–7.7)
Neutrophils Relative %: 62 %
Platelet Count: 178 10*3/uL (ref 150–400)
RBC: 4.17 MIL/uL — ABNORMAL LOW (ref 4.22–5.81)
RDW: 13 % (ref 11.5–15.5)
WBC Count: 4 10*3/uL (ref 4.0–10.5)
nRBC: 0 % (ref 0.0–0.2)

## 2022-09-24 LAB — CMP (CANCER CENTER ONLY)
ALT: 12 U/L (ref 0–44)
AST: 17 U/L (ref 15–41)
Albumin: 4.1 g/dL (ref 3.5–5.0)
Alkaline Phosphatase: 54 U/L (ref 38–126)
Anion gap: 4 — ABNORMAL LOW (ref 5–15)
BUN: 20 mg/dL (ref 8–23)
CO2: 28 mmol/L (ref 22–32)
Calcium: 9.7 mg/dL (ref 8.9–10.3)
Chloride: 105 mmol/L (ref 98–111)
Creatinine: 0.85 mg/dL (ref 0.61–1.24)
GFR, Estimated: >60 mL/min (ref >60)
Glucose, Bld: 115 mg/dL — ABNORMAL HIGH (ref 70–99)
Potassium: 4 mmol/L (ref 3.5–5.1)
Sodium: 137 mmol/L (ref 135–145)
Total Bilirubin: 0.4 mg/dL (ref 0.3–1.2)
Total Protein: 6.8 g/dL (ref 6.5–8.1)

## 2022-09-25 ENCOUNTER — Other Ambulatory Visit: Payer: Medicare Other

## 2022-09-29 ENCOUNTER — Ambulatory Visit: Payer: Medicare Other | Admitting: Family Medicine

## 2022-09-29 ENCOUNTER — Encounter: Payer: Self-pay | Admitting: Family Medicine

## 2022-09-29 VITALS — BP 142/78 | HR 64 | Ht 70.0 in | Wt 176.8 lb

## 2022-09-29 DIAGNOSIS — I10 Essential (primary) hypertension: Secondary | ICD-10-CM

## 2022-09-29 DIAGNOSIS — L84 Corns and callosities: Secondary | ICD-10-CM

## 2022-09-29 DIAGNOSIS — G588 Other specified mononeuropathies: Secondary | ICD-10-CM | POA: Diagnosis not present

## 2022-09-29 NOTE — Assessment & Plan Note (Signed)
Patient has persistent pain in the perineal region status post treatment of prostate cancer as well as placement of prosthetic urethral sphincter device.  Has tried pudendal nerve blocks, topical treatments, oral pain medications in the form of gabapentin and over-the-counter medications.  Nothing seems to help.  Exacerbated when he sits.  Patient has a midline perineal lump or mass halfway between the anus and scrotum in which the pain appears to originate.  CT scan from January of this year does not note presence of anything in that location.  Patient comes to me today seeking alternative options for pain management.  Discussed dry needling or acupuncture, continued pudendal nerve blocks, topical capsaicin.  Discussed how referral to physical medicine and rehabilitation may be able to try multiple of these options if necessary.  Will pursue further imaging of the subcutaneous mass where his pain originates - X-ray pelvis, pending this result will order ultrasound - Referral to physical medicine rehab for possible nerve block, other treatment modalities. - Continue current treatments - If patient desires can increase his gabapentin from 600-900 3 times daily

## 2022-09-29 NOTE — Assessment & Plan Note (Signed)
>>  ASSESSMENT AND PLAN FOR NEURALGIA OF BOTH PUDENDAL NERVES WRITTEN ON 09/29/2022  2:48 PM BY Sandre Kitty, MD  Patient has persistent pain in the perineal region status post treatment of prostate cancer as well as placement of prosthetic urethral sphincter device.  Has tried pudendal nerve blocks, topical treatments, oral pain medications in the form of gabapentin and over-the-counter medications.  Nothing seems to help.  Exacerbated when he sits.  Patient has a midline perineal lump or mass halfway between the anus and scrotum in which the pain appears to originate.  CT scan from January of this year does not note presence of anything in that location.  Patient comes to me today seeking alternative options for pain management.  Discussed dry needling or acupuncture, continued pudendal nerve blocks, topical capsaicin.  Discussed how referral to physical medicine and rehabilitation may be able to try multiple of these options if necessary.  Will pursue further imaging of the subcutaneous mass where his pain originates - X-ray pelvis, pending this result will order ultrasound - Referral to physical medicine rehab for possible nerve block, other treatment modalities. - Continue current treatments - If patient desires can increase his gabapentin from 600-900 3 times daily

## 2022-09-29 NOTE — Patient Instructions (Signed)
It was nice to see you today,  We addressed the following topics today: -I will send in a referral to physical medicine and rehab - I will send in a referral to podiatry - If you want to talk to the prescriber about increasing gabapentin, I can send in the new prescription if they are fine with that increase. - Follow-up with me in 2 months.  Have a great day,  Frederic Jericho, MD

## 2022-09-29 NOTE — Assessment & Plan Note (Signed)
Painful Corn on the left heel. - Over-the-counter cushions - Ambulatory referral to podiatry

## 2022-09-29 NOTE — Progress Notes (Unsigned)
Established Patient Office Visit  Subjective   Patient ID: [name], male    DOB: 03/08/1945  Age: 77 y.o. MRN: [id number]  Chief Complaint  Patient presents with   Referral    HPI Pudendal nerve pain-patient has not tried any new treatment modalities since I spoke with him on the phone last.  He does have a ointment scheduled tomorrow with the provider who does his nerve block injections.  Also has an appointment in the future with the pain management provider at the neurosurgeons office.  Patient states that his gabapentin was working but feels like it is wearing off now.  He wants to talk with the prescriber of the medication before increasing the dose of from 600 3 times daily to 900 3 times daily.  It does not make him drowsy.  He takes the Ativan prescribed to him rarely.  Hypertension-patient states his blood pressure is labile.  Recently at church he felt dizzy and checked his blood pressure and it was 101/54.  Patient complains of pain in the left heel.  States wife noticed something on his foot and he would like to have looked at.  Has never had warts or corns before.  Patient okay with referral to podiatry to remove corn if needed.  Discussed using cushions until then.   The ASCVD Risk score (Arnett DK, et al., 2019) failed to calculate for the following reasons:   Cannot find a previous HDL lab   Cannot find a previous total cholesterol lab  Health Maintenance Due  Topic Date Due   Hepatitis C Screening  Never done   COVID-19 Vaccine (4 - 2023-24 season) 10/25/2021   Medicare Annual Wellness (AWV)  01/31/2022   INFLUENZA VACCINE  09/25/2022      Objective:     BP (!) 142/78   Pulse 64   Ht 5\' 10"  (1.778 m)   Wt 176 lb 12.8 oz (80.2 kg)   SpO2 97%   BMI 25.37 kg/m  {Vitals History (Optional):23777}  Physical Exam General: Alert, oriented GU: Urethral sphincter device palpable in the scrotum.  In the perineal area midline there is a subcutaneous bump hard  to touch.  Immobile.  Nonfluctuant. Skin: No rashes. Extremities: Left plantar surface near the heel there is a small hyperkeratosis.     No results found for any visits on 09/29/22.      Assessment & Plan:   Neuralgia of both pudendal nerves Assessment & Plan: Patient has persistent pain in the perineal region status post treatment of prostate cancer as well as placement of prosthetic urethral sphincter device.  Has tried pudendal nerve blocks, topical treatments, oral pain medications in the form of gabapentin and over-the-counter medications.  Nothing seems to help.  Exacerbated when he sits.  Patient has a midline perineal lump or mass halfway between the anus and scrotum in which the pain appears to originate.  CT scan from January of this year does not note presence of anything in that location.  Patient comes to me today seeking alternative options for pain management.  Discussed dry needling or acupuncture, continued pudendal nerve blocks, topical capsaicin.  Discussed how referral to physical medicine and rehabilitation may be able to try multiple of these options if necessary.  Will pursue further imaging of the subcutaneous mass where his pain originates - X-ray pelvis, pending this result will order ultrasound - Referral to physical medicine rehab for possible nerve block, other treatment modalities. - Continue current treatments - If  patient desires can increase his gabapentin from 600-900 3 times daily  Orders: -     Ambulatory referral to Physical Medicine Rehab -     DG Pelvis 1-2 Views; Future  Plantar callus Assessment & Plan: Painful Corn on the left heel. - Over-the-counter cushions - Ambulatory referral to podiatry  Orders: -     Ambulatory referral to Podiatry  Primary hypertension Assessment & Plan: Blood pressure is labile.  Elevated today, but has taken his blood pressure at home and has been hypotensive.  Blood pressure medications prescribed by his  cardiologist.  Will defer change in management at this time given his labile pressures.      Return in about 2 months (around 11/29/2022) for Groin pain.    Sandre Kitty, MD

## 2022-09-29 NOTE — Assessment & Plan Note (Signed)
Blood pressure is labile.  Elevated today, but has taken his blood pressure at home and has been hypotensive.  Blood pressure medications prescribed by his cardiologist.  Will defer change in management at this time given his labile pressures.

## 2022-09-30 DIAGNOSIS — R102 Pelvic and perineal pain: Secondary | ICD-10-CM | POA: Diagnosis not present

## 2022-10-03 DIAGNOSIS — R278 Other lack of coordination: Secondary | ICD-10-CM | POA: Diagnosis not present

## 2022-10-03 DIAGNOSIS — K599 Functional intestinal disorder, unspecified: Secondary | ICD-10-CM | POA: Diagnosis not present

## 2022-10-03 DIAGNOSIS — R102 Pelvic and perineal pain: Secondary | ICD-10-CM | POA: Diagnosis not present

## 2022-10-03 DIAGNOSIS — R198 Other specified symptoms and signs involving the digestive system and abdomen: Secondary | ICD-10-CM | POA: Diagnosis not present

## 2022-10-09 DIAGNOSIS — N393 Stress incontinence (female) (male): Secondary | ICD-10-CM | POA: Diagnosis not present

## 2022-10-09 DIAGNOSIS — R102 Pelvic and perineal pain: Secondary | ICD-10-CM | POA: Diagnosis not present

## 2022-10-13 ENCOUNTER — Encounter: Payer: Self-pay | Admitting: Podiatry

## 2022-10-13 ENCOUNTER — Ambulatory Visit (INDEPENDENT_AMBULATORY_CARE_PROVIDER_SITE_OTHER): Payer: Medicare Other | Admitting: Podiatry

## 2022-10-13 DIAGNOSIS — B070 Plantar wart: Secondary | ICD-10-CM

## 2022-10-14 ENCOUNTER — Telehealth: Payer: Self-pay

## 2022-10-14 ENCOUNTER — Other Ambulatory Visit: Payer: Self-pay | Admitting: Family Medicine

## 2022-10-14 DIAGNOSIS — G588 Other specified mononeuropathies: Secondary | ICD-10-CM

## 2022-10-14 NOTE — Telephone Encounter (Signed)
Pt called back and I gave him the information from doctor Constance Goltz about changing his referral to a different location.

## 2022-10-14 NOTE — Telephone Encounter (Signed)
Called pt LVM to call the office  

## 2022-10-15 DIAGNOSIS — G588 Other specified mononeuropathies: Secondary | ICD-10-CM | POA: Diagnosis not present

## 2022-10-15 DIAGNOSIS — G894 Chronic pain syndrome: Secondary | ICD-10-CM | POA: Diagnosis not present

## 2022-10-15 NOTE — Progress Notes (Signed)
Subjective:   Patient ID: [name], male   DOB: 77 y.o.   MRN: [id number]   HPI Patient presents with caregiver with a very painful lesion plantar aspect of the left heel that is been very sore and hard to walk on.  States has been present for several months they have tried over-the-counter medicine patient does not smoke tries to be active   Review of Systems  All other systems reviewed and are negative.       Objective:  Physical Exam Vitals and nursing note reviewed.  Constitutional:      Appearance: He is well-developed.  Pulmonary:     Effort: Pulmonary effort is normal.  Musculoskeletal:        General: Normal range of motion.  Skin:    General: Skin is warm.  Neurological:     Mental Status: He is alert.     Neurovascular status was found to be intact muscle strength is adequate range of motion within normal limits.  Patient is noted to have lesion plantar aspect left heel measuring approximately 8 x 8 mm that upon debridement shows pinpoint bleeding.  Patient is found to have good digital perfusion is well-oriented x 3 pain to lateral pressure noted     Assessment:  Probability for verruca plantaris plantar aspect left heel     Plan:  H&P reviewed discussed also foreign body I did sterile prep today I debrided out the lesion I exposed it applied chemical agent to create immune response with sterile dressing explained what to do if blistering were to occur sterile dressing reapplied reappoint to recheck

## 2022-10-17 DIAGNOSIS — R198 Other specified symptoms and signs involving the digestive system and abdomen: Secondary | ICD-10-CM | POA: Diagnosis not present

## 2022-10-17 DIAGNOSIS — R278 Other lack of coordination: Secondary | ICD-10-CM | POA: Diagnosis not present

## 2022-10-17 DIAGNOSIS — R102 Pelvic and perineal pain: Secondary | ICD-10-CM | POA: Diagnosis not present

## 2022-10-17 DIAGNOSIS — K599 Functional intestinal disorder, unspecified: Secondary | ICD-10-CM | POA: Diagnosis not present

## 2022-10-21 DIAGNOSIS — M19011 Primary osteoarthritis, right shoulder: Secondary | ICD-10-CM | POA: Diagnosis not present

## 2022-10-21 DIAGNOSIS — M25511 Pain in right shoulder: Secondary | ICD-10-CM | POA: Diagnosis not present

## 2022-10-22 NOTE — Progress Notes (Unsigned)
Assessment/Plan:    1.  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  In the past, the patient tried to increase the dose but had side effects.  Lately, he has been trying to go back up and seems to be tolerating it.  He states that he is going to try and increase back to 250 mg in the morning.  He will let me know if he needs a new refill of the medication.  -Patient knows he has very few options short of DBS surgery or focused ultrasound and he isn't interested in that.  Discussed focused ultrasound in detail today. 2.  Spasmodic dysphonia  -Discussed nature and pathophysiology.  Discussed role of Botox.  Wants to hold on ENT referral  -Discussed speech therapy for this and for slight hypophonia.  He would like to get the referral for that. 3.  Severe scrotal/perineal pain  -Developed after a surgery for an artificial urinary sphincter.  Patient is following with urology.  Had hypogastric nerve ablation done by interventional radiology but it made it worse and now following with Dr. Lorrine Kin and pelvic floor physical therapy  4..  B12 deficiency  -pt with evidence of B12 deficiency. He started a supplement in May after showing low b12 but he needs to increase the dose to  5..  Memory change  -Doubt neurodegenerative  -check tsh -Schedule neurocognitive testing  Subjective:   [name] was seen today in follow up for essential tremor.  My previous records were reviewed prior to todays visit.  Wife present today and supplements history.  Patient last seen in January.  He is currently on primidone, 50 mg, 4.5 tablets in the morning and 1 tablet at bedtime.  He tried to go higher than that in the past and just did not have success as he had side effects, but he was trying to creep back up on the dose last visit.  He has not been interested in DBS or focused ultrasound.  He asked about focused  ultrasound today and we discussed it in detail.  Unfortunately, he continues to have perennial pain post a surgery sometime ago for a urinary sphincter.  He is doing pelvic floor physical therapy.  He notes his speech has been soft x months and he wonders if some of it is due to lying down all the time due to perineal pain.  He feels that his speech has been worse for the last few days.  He wonders if he has dementia.  Wife notices that processing speed is very slow.  Wife notes family history of dementia.  Current prescribed movement disorder medications: Primidone, 50 mg, 4.5 tablets in the morning and 1 tablet at bed (currently doing 4.5 in the AM and 1 at bed) Ativan 0.5 mg, half tablet daily  Prior medications: Primidone, 250 mg (had side effects at this dose)  ALLERGIES:   Allergies  Allergen Reactions   Imipramine Other (See Comments)   Lisinopril-Hydrochlorothiazide     Other reaction(s): cough?   Meloxicam     Other reaction(s): rash?... Able to tolerate Aleve without problems   Tape Other (See Comments) and Rash    Surgical tape=redness/rash at site    CURRENT MEDICATIONS:  Outpatient Encounter Medications as of 10/23/2022  Medication Sig   acetaminophen (TYLENOL) 325 MG tablet Take 325 mg by mouth every 6 (six) hours as needed for mild pain,  moderate pain, fever or headache.   amLODipine (NORVASC) 2.5 MG tablet Take 1 tablet (2.5 mg total) by mouth daily.   ascorbic acid (VITAMIN C) 1000 MG tablet Take 800 mg by mouth daily. Pt takes 5 times a week   LORazepam (ATIVAN) 0.5 MG tablet Take 0.5 mg by mouth as directed. For Anxiety   losartan (COZAAR) 100 MG tablet Take 1 tablet (100 mg total) by mouth daily.   Magnesium Oxide -Mg Supplement 200 MG TABS TAKE 1 TABLET BY MOUTH IN THE MORNING AND AT BEDTIME   primidone (MYSOLINE) 50 MG tablet Pt. Takes 5 tablets 100 mg in the AM, and 1 tablet 50 mg in the PM   psyllium (REGULOID) 0.52 g capsule Take 0.52 g by mouth daily. 2 capsules  by mouth twice daily   rosuvastatin (CRESTOR) 10 MG tablet Take 1 tablet (10 mg total) by mouth daily.   zolpidem (AMBIEN CR) 12.5 MG CR tablet Take by mouth at bedtime. 1/2 to 1 tablet daily at bedtime   gabapentin (NEURONTIN) 300 MG capsule Take 600 mg by mouth 3 (three) times daily. (Patient not taking: Reported on 10/23/2022)   Multiple Vitamin (MULTIVITAMIN WITH MINERALS) TABS tablet Take 1 tablet by mouth 2 (two) times a week. (Patient not taking: Reported on 09/29/2022)   No facility-administered encounter medications on file as of 10/23/2022.     Objective:    PHYSICAL EXAMINATION:    VITALS:   Vitals:   10/23/22 1433  BP: 120/70  Pulse: 61  SpO2: 98%  Weight: 176 lb (79.8 kg)  Height: 5\' 11"  (1.803 m)   GEN:  The patient appears stated age and is in NAD. HEENT:  Normocephalic, atraumatic.  The mucous membranes are moist. The superficial temporal arteries are without ropiness or tenderness. CV:  RRR Lungs:  CTAB Neck/HEME:  There are no carotid bruits bilaterally.  Neurological examination:  Orientation: The patient is alert and oriented x3. Cranial nerves: There is good facial symmetry. The speech is fluent, mildly hypophonic with evidence of spasmodic dysphonia.. Soft palate rises symmetrically and there is no tongue deviation. Hearing is intact to conversational tone. Sensation: Sensation is intact to light touch throughout Motor: Strength is at least antigravity x4.  Movement examination: Tone: There is normal tone in the UE/LE Abnormal movements: There is just mild tremor of the outstretched hands and with intention.  He has trouble with Archimedes spirals, especially trying to get the hand to the paper.  The left is worse than the right.   I have reviewed and interpreted the following labs independently   Chemistry      Component Value Date/Time   NA 137 09/24/2022 1114   NA 141 05/08/2022 1158   NA 141 01/06/2017 0820   K 4.0 09/24/2022 1114   K 4.2  01/06/2017 0820   CL 105 09/24/2022 1114   CO2 28 09/24/2022 1114   CO2 26 01/06/2017 0820   BUN 20 09/24/2022 1114   BUN 19 05/08/2022 1158   BUN 28.9 (H) 01/06/2017 0820   CREATININE 0.85 09/24/2022 1114   CREATININE 1.1 01/06/2017 0820      Component Value Date/Time   CALCIUM 9.7 09/24/2022 1114   CALCIUM 9.7 01/06/2017 0820   ALKPHOS 54 09/24/2022 1114   ALKPHOS 41 01/06/2017 0820   AST 17 09/24/2022 1114   AST 19 01/06/2017 0820   ALT 12 09/24/2022 1114   ALT 16 01/06/2017 0820   BILITOT 0.4 09/24/2022 1114   BILITOT 0.43 01/06/2017  0820      Lab Results  Component Value Date   WBC 4.0 09/24/2022   HGB 12.4 (L) 09/24/2022   HCT 36.8 (L) 09/24/2022   MCV 88.2 09/24/2022   PLT 178 09/24/2022   No results found for: "TSH"   Chemistry      Component Value Date/Time   NA 137 09/24/2022 1114   NA 141 05/08/2022 1158   NA 141 01/06/2017 0820   K 4.0 09/24/2022 1114   K 4.2 01/06/2017 0820   CL 105 09/24/2022 1114   CO2 28 09/24/2022 1114   CO2 26 01/06/2017 0820   BUN 20 09/24/2022 1114   BUN 19 05/08/2022 1158   BUN 28.9 (H) 01/06/2017 0820   CREATININE 0.85 09/24/2022 1114   CREATININE 1.1 01/06/2017 0820      Component Value Date/Time   CALCIUM 9.7 09/24/2022 1114   CALCIUM 9.7 01/06/2017 0820   ALKPHOS 54 09/24/2022 1114   ALKPHOS 41 01/06/2017 0820   AST 17 09/24/2022 1114   AST 19 01/06/2017 0820   ALT 12 09/24/2022 1114   ALT 16 01/06/2017 0820   BILITOT 0.4 09/24/2022 1114   BILITOT 0.43 01/06/2017 0820     Lab Results  Component Value Date   VITAMINB12 254 07/17/2022   No results found for: "TSH"  Total time spent on today's visit was 40 minutes, including both face-to-face time and nonface-to-face time.  Time included that spent on review of records (prior notes available to me/labs/imaging if pertinent), discussing treatment and goals, answering patient's questions and coordinating care.     Cc:  Sandre Kitty, MD

## 2022-10-23 ENCOUNTER — Ambulatory Visit (INDEPENDENT_AMBULATORY_CARE_PROVIDER_SITE_OTHER): Payer: Medicare Other | Admitting: Neurology

## 2022-10-23 ENCOUNTER — Other Ambulatory Visit (INDEPENDENT_AMBULATORY_CARE_PROVIDER_SITE_OTHER): Payer: Medicare Other

## 2022-10-23 ENCOUNTER — Encounter: Payer: Self-pay | Admitting: Neurology

## 2022-10-23 VITALS — BP 120/70 | HR 61 | Ht 71.0 in | Wt 176.0 lb

## 2022-10-23 DIAGNOSIS — J383 Other diseases of vocal cords: Secondary | ICD-10-CM | POA: Diagnosis not present

## 2022-10-23 DIAGNOSIS — R498 Other voice and resonance disorders: Secondary | ICD-10-CM | POA: Diagnosis not present

## 2022-10-23 DIAGNOSIS — R413 Other amnesia: Secondary | ICD-10-CM

## 2022-10-23 DIAGNOSIS — R251 Tremor, unspecified: Secondary | ICD-10-CM | POA: Diagnosis not present

## 2022-10-23 NOTE — Patient Instructions (Signed)
You have been diagnosed with low B12.  We recommend that you take over the counter B12, 1000 micrograms daily.  You have been referred for a neurocognitive evaluation (i.e., evaluation of memory and thinking abilities). Please bring someone with you to this appointment if possible, as it is helpful for the neuropsychologist to hear from both you and another adult who knows you well. Please bring eyeglasses and hearing aids if you wear them and take any medications as you normally would. Please fully abstain from all alcohol, marijuana, or other substances prior to your appointment.   The evaluation will take approximately 2-3 hours and has two parts:   The first part is a clinical interview with the neuropsychologist, Dr. Milbert Coulter.  During the interview, the neuropsychologist will speak with you and the individual you brought to the appointment.    The second part of the evaluation is testing with the doctor's technician, aka psychometrician, Annabelle Harman or [employer]. During the testing, the technician will ask you to remember different types of material, solve problems, and answer some questionnaires. Your family member will not be present for this portion of the evaluation.   Please note: We have to reserve several hours of the neuropsychologist's time and the psychometrician's time for your evaluation appointment. As such, there is a No-Show fee of $100. If you are unable to attend any of your appointments, please contact our office as soon as possible to reschedule.

## 2022-10-24 ENCOUNTER — Other Ambulatory Visit: Payer: Self-pay

## 2022-10-24 ENCOUNTER — Telehealth: Payer: Self-pay | Admitting: Neurology

## 2022-10-24 DIAGNOSIS — G250 Essential tremor: Secondary | ICD-10-CM

## 2022-10-24 LAB — TSH: TSH: 3.05 u[IU]/mL (ref 0.35–5.50)

## 2022-10-24 MED ORDER — PRIMIDONE 50 MG PO TABS
ORAL_TABLET | ORAL | 0 refills | Status: DC
Start: 2022-10-24 — End: 2023-05-05

## 2022-10-24 NOTE — Telephone Encounter (Signed)
RX sent my chart message sent to patient to make him aware

## 2022-10-24 NOTE — Telephone Encounter (Signed)
Pt is calling in stating that he needs a refill on Rx primidone (MYSOLINE) 50 MG  Ph[facility] [address].  Pt stated that he does not have any at this present time.

## 2022-10-29 DIAGNOSIS — R102 Pelvic and perineal pain: Secondary | ICD-10-CM | POA: Diagnosis not present

## 2022-10-29 DIAGNOSIS — Z960 Presence of urogenital implants: Secondary | ICD-10-CM | POA: Diagnosis not present

## 2022-10-30 ENCOUNTER — Other Ambulatory Visit: Payer: Self-pay

## 2022-10-30 ENCOUNTER — Telehealth: Payer: Self-pay | Admitting: Neurology

## 2022-10-30 DIAGNOSIS — G250 Essential tremor: Secondary | ICD-10-CM

## 2022-10-30 MED ORDER — PRIMIDONE 250 MG PO TABS: 250.0000 mg | ORAL_TABLET | Freq: Every morning | ORAL | 1 refills | Status: DC

## 2022-10-30 NOTE — Telephone Encounter (Signed)
Yes I sent that mg in for the patient I misunderstood and just sent in his regular 50 mg taking 5 in the am and one in the pm. He said he has tons of 50 mg but wanted the 250 mg so he doesn't have to take so many.

## 2022-10-30 NOTE — Telephone Encounter (Signed)
Patient called from the pharmacy stating that the pharamcy only has the 50mg  , they dont have the 250mg  and that is what he is needing

## 2022-10-30 NOTE — Telephone Encounter (Signed)
Called patient and RX is now fixed with the 250 mg in the morning and 50 mg in the pm

## 2022-10-30 NOTE — Telephone Encounter (Signed)
Patient called nurse line stating he is at the  pharmacy for his Primidone 50mg  RX. Pharmacy doesn't the 50mg  only the 250mg 

## 2022-10-31 DIAGNOSIS — R198 Other specified symptoms and signs involving the digestive system and abdomen: Secondary | ICD-10-CM | POA: Diagnosis not present

## 2022-10-31 DIAGNOSIS — R278 Other lack of coordination: Secondary | ICD-10-CM | POA: Diagnosis not present

## 2022-10-31 DIAGNOSIS — K599 Functional intestinal disorder, unspecified: Secondary | ICD-10-CM | POA: Diagnosis not present

## 2022-10-31 DIAGNOSIS — R102 Pelvic and perineal pain: Secondary | ICD-10-CM | POA: Diagnosis not present

## 2022-11-05 ENCOUNTER — Encounter: Payer: Self-pay | Admitting: Family Medicine

## 2022-11-05 ENCOUNTER — Other Ambulatory Visit: Payer: Self-pay

## 2022-11-05 ENCOUNTER — Ambulatory Visit (INDEPENDENT_AMBULATORY_CARE_PROVIDER_SITE_OTHER): Payer: Medicare Other | Admitting: Family Medicine

## 2022-11-05 ENCOUNTER — Encounter: Payer: Self-pay | Admitting: Speech Pathology

## 2022-11-05 ENCOUNTER — Ambulatory Visit: Payer: Medicare Other | Attending: Neurology | Admitting: Speech Pathology

## 2022-11-05 VITALS — BP 108/64 | HR 63 | Ht 71.0 in | Wt 171.2 lb

## 2022-11-05 DIAGNOSIS — J383 Other diseases of vocal cords: Secondary | ICD-10-CM

## 2022-11-05 DIAGNOSIS — G588 Other specified mononeuropathies: Secondary | ICD-10-CM | POA: Diagnosis not present

## 2022-11-05 DIAGNOSIS — R498 Other voice and resonance disorders: Secondary | ICD-10-CM | POA: Diagnosis not present

## 2022-11-05 NOTE — Therapy (Signed)
OUTPATIENT SPEECH LANGUAGE PATHOLOGY VOICE EVALUATION   Patient Name: [name] MRN: [id number] DOB:10/01/1945, 77 y.o., male Today's Date: 11/05/2022  PCP: Sandre Kitty MD,  REFERRING PROVIDER: Vladimir Faster, DO  END OF SESSION:  End of Session - 11/05/22 1632     Visit Number 1    Number of Visits 17    Date for SLP Re-Evaluation 12/31/22    SLP Start Time 1315    SLP Stop Time  1400    SLP Time Calculation (min) 45 min    Activity Tolerance Patient tolerated treatment well             Past Medical History:  Diagnosis Date   Arthritis    hands    Cancer (HCC)    prostate cancer    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   Hyperlipidemia    Hypertension    Prostate cancer (HCC)    Recurrent upper respiratory infection (URI)    pt had a cold recent- week ago - better now    Tremor    Past Surgical History:  Procedure Laterality Date   CATARACT EXTRACTION Bilateral    LUMBAR LAMINECTOMY/DECOMPRESSION MICRODISCECTOMY Left 04/01/2018   Procedure: Left Lumbar four-lumbar five Microdiscectomy;  Surgeon: Maeola Harman, MD;  Location: [facility] OR;  Service: Neurosurgery;  Laterality: Left;   OTHER SURGICAL HISTORY  2005   right hand middle finger surgery    REPAIR OF CEREBROSPINAL FLUID LEAK N/A 05/18/2018   Procedure: Exploration of Lumbar four-five with repair of cerebrospinal fluid leak;  Surgeon: Maeola Harman, MD;  Location: [facility] OR;  Service: Neurosurgery;  Laterality: N/A;   ROBOT ASSISTED LAPAROSCOPIC RADICAL PROSTATECTOMY  01/13/2011   Procedure: ROBOTIC ASSISTED LAPAROSCOPIC RADICAL PROSTATECTOMY LEVEL 2;  Surgeon: Crecencio Mc, MD;  Location: [facility] ORS;  Service: Urology;  Laterality: Bilateral;  Robotic Assisted Laparoscopic Prostatetectomy with Bilateral Lymphadenectomy  Level 2   Patient Active Problem List   Diagnosis Date Noted   Neuralgia of both pudendal nerves 09/29/2022   Plantar callus 09/29/2022   Constipation 05/20/2022   Urinary incontinence 05/20/2022   Insomnia  05/20/2022   Restless leg 05/20/2022   Hypertension 02/26/2021   Herniated lumbar disc without myelopathy 04/01/2018   Essential tremor 01/24/2016   Prostate cancer (HCC) 03/10/2013    Onset date: 10/23/2022  REFERRING DIAG: R49.8 (ICD-10-CM) - 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 Arbutus Leas who is seeing him for essential tremor. She notes low voice and spasmodic dysphonia. They held off on ENT referral as his last appointment with her. [name] and his wife note decline in voice, volume and intelligibility over the past 5 months. [name] is referred for neuropsych testing due to memory and slow processing.   PAIN:  Are you having pain? Yes: NPRS scale: 7/10 Pain location: sacrum Pain description: ache Aggravating factors: sitting Relieving factors: cut out pillow  FALLS: Has patient fallen in last 6 months? No,  LIVING ENVIRONMENT: Lives with: lives with their spouse Lives in: House/apartment  PLOF:Level of assistance: Independent with ADLs, Needed assistance with IADLS Employment: Retired  PATIENT GOALS: "To be heard"  OBJECTIVE:    COGNITION: Overall cognitive status: Impaired Areas of impairment:  Memory: Impaired: Short term Functional  deficits:   SOCIAL HISTORY: Occupation: retired Counsellor intake: optimal Caffeine/alcohol intake: minimal Daily voice use: minimal  PERCEPTUAL VOICE ASSESSMENT: Voice quality: hoarse, breathy, strained, low vocal intensity, vocal fatigue, and aphonic Vocal abuse:  n/a Resonance: normal Respiratory function: thoracic breathing  OBJECTIVE VOICE ASSESSMENT: Maximum phonation time for sustained "ah":  6.42 Conversational loudness average: 62 dB Conversational loudness range: 56-67 dB  PATIENT REPORTED OUTCOME MEASURES (PROM): Communication Effectiveness Survey: 15/32. [name] rates a 2, or minimally effective communication with family at home, with strangers, over the phone, in a noisy environment, and in the car. He rates a 1 or not at all effective conversing across a room.  TODAY'S TREATMENT:                                                                                                                                         DATE:   11/05/22 (eval day): Trials of exuberant voice therapy achieved improved volume and voice quality. [name] achieved and maintained 83dB average on sustained Ah! With clear phonation. He achieved WNL volume and clear voice on pitch glides as well as phonatory resistance sentences. Mild vocal tremor noted however this did not distract from his speech. Trained in HEP for hypophonia. He sees PCP later today. I instructed them to get a referral to Dr. Irene Pap, laryngologist. He does endorse pill dysphagia where pill gets stuck and he "brings it back up" several times a week. Consider MBSS   PATIENT EDUCATION: Education details: HEP for voice, vocal hygiene Person educated: Patient and Spouse Education method: Explanation, Demonstration, Verbal cues, and Handouts Education comprehension: verbalized understanding, returned demonstration, verbal cues required, and needs further education  HOME EXERCISE PROGRAM: Phonation Resistance Training Exercises (PhoRTE), exuberant voice - will modify pending ENT consult  GOALS: Goals reviewed with patient? Yes  SHORT TERM GOALS: Target date: 12/03/22  Pt will complete HEP for voice with occasional min A over 1 week Baseline: Goal status: INITIAL  2.  Pt will average 88dB on sustained vowel for 10 seconds with clear phonation Baseline:  Goal status: INITIAL  3.  Pt will average 70dB 18/20 sentences with clear phonation  and occasional min A Baseline:  Goal status: INITIAL  4.  Pt will average 70dB with clear phonation over 8 minute conversation with occasional min A  Baseline:  Goal status: INITIAL  5.  Pt will follow swallow precautions and diet modifications pending MBSS with occasional min A Baseline:  Goal status: INITIAL   LONG TERM GOALS: Target date: 12/31/22  Pt will complete HEP for voice with mod I over 1 week Baseline:  Goal status: INITIAL  2.  Pt will average 70 dB with clear phonation over 15 minute conversation with rare min A Baseline:  Goal status: INITIAL  3.  Pt will be intelligible over 10 minute conversation in noisy environment with rare min A Baseline:  Goal status: INITIAL  4.  Pt will improve score on Communicative Effectiveness Survey by 3 points Baseline: 15 Goal status: INITIAL  5.  If MBSS indicated, pt will follow diet modifications and swallow precautions with rare min A Baseline:  Goal status: INITIAL   ASSESSMENT:  CLINICAL IMPRESSION: Patient is a 77 y.o. male who was seen today for dysphonia and vocal tremor. [name] presents with moderate hypophonia, hoarse, raspy voice, and intermittent aphonia. Mild vocal tremor is present. He is accompanied by his spouse, Diane who also endorses low volume and reduced intelligibility. [name] reports frequent requests to repeat himself.  He has limited phone use due to his volume/voice. [name] has had limited activity in the past year due to sacral pain, he is primarily reclined at home. They also endorse occasional choking/coughing during meals and [name] reports his pills "sometimes get stuck and I have to spit it  back up." This occurs a couple of times a week. [name] did get improved volume and improve vocal quality with exuberant voice and phonation resistance exercises, indicating a good candidate for ST. I recommend he see Dr. Irene Pap, laryngologist for imaging of larynx to r/o vocal pathology. Will consider MBSS and  cognitive compensations (neuropsych testing pending). I recommend skilled ST to maximize intelligibility for safety, independence and to reduce caregiver burden.  OBJECTIVE IMPAIRMENTS: include memory, voice disorder, and dysphagia. These impairments are limiting patient from managing appointments, household responsibilities, effectively communicating at home and in community, and safety when swallowing. Factors affecting potential to achieve goals and functional outcome are medical prognosis.. Patient will benefit from skilled SLP services to address above impairments and improve overall function.  REHAB POTENTIAL: Good  PLAN:  SLP FREQUENCY: 2x/week  SLP DURATION: 8 weeks  PLANNED INTERVENTIONS: Aspiration precaution training, Pharyngeal strengthening exercises, Diet toleration management , Environmental controls, Trials of upgraded texture/liquids, Cueing hierachy, Cognitive reorganization, Internal/external aids, Oral motor exercises, Functional tasks, SLP instruction and feedback, Compensatory strategies, and Patient/family education, MBSS    Jayro Mcmath, Radene Journey, CCC-SLP 11/05/2022, 4:33 PM

## 2022-11-05 NOTE — Patient Instructions (Signed)
   Twice a day  Loud Ah! For 10 seconds 10x  10 pitch glides with good volume  10 sentences in a high pitch like you are calling to a neighbor over a fence  10 sentences in a low authoritative pitch like you are the boss  Speak with intent - speak deliberately with good volume  You can work up the endurance and stamina for a strong voice  Please let your PCP know about any trouble swallowing  Dr. Ashok Croon - laryngologist specializes in voice, swallowing, upper airway [phone] - ask your PCP for a referral

## 2022-11-05 NOTE — Progress Notes (Unsigned)
   Acute Office Visit  Subjective:     Patient ID: [name], male    DOB: 09/07/1945, 77 y.o.   MRN: [id number]  No chief complaint on file.   HPI Patient is in today for sore throat  3-4 weeeks odd feeling in throat.  Spasmodic dysphia.  Referred to speech therpaist.  Another specialist.  Referrall.  Dr. Ashok Croon. Specialises in voice, swallowing, upper aiway.  [phone].   [facility] - gabapentin to pregabalin.    [facility] - shouldre   [facility]  ROS      Objective:    There were no vitals taken for this visit. {Vitals History (Optional):23777}  Physical Exam  No results found for any visits on 11/05/22.      Assessment & Plan:   There are no diagnoses linked to this encounter.   No follow-ups on file.  Sandre Kitty, MD

## 2022-11-05 NOTE — Patient Instructions (Signed)
It was nice to see you today,  We addressed the following topics today: -I will send in referral to Dr. Irene Pap. - After I see you again in October 7 we can talk about your other referral regarding your groin pain and also possible shockwave therapy if that is appropriate. - I will try to get some records from your other doctors that are not in our network including the orthopedist.  Have a great day,  Frederic Jericho, MD

## 2022-11-06 DIAGNOSIS — J383 Other diseases of vocal cords: Secondary | ICD-10-CM | POA: Insufficient documentation

## 2022-11-06 NOTE — Assessment & Plan Note (Signed)
Recently switched from gabapentin to pregabalin with improved pain.  Has appt with [place] pain mgmt next month.  Advised pt to speak with them prior to attempting shockwave therapy mentioned by his orthopedist.

## 2022-11-06 NOTE — Assessment & Plan Note (Signed)
Referral placed to dr. Irene Pap, ent specialist.  Pt has already had first appt with SLP.

## 2022-11-06 NOTE — Assessment & Plan Note (Signed)
>>  ASSESSMENT AND PLAN FOR NEURALGIA OF BOTH PUDENDAL NERVES WRITTEN ON 11/06/2022  6:06 PM BY Sandre Kitty, MD  Recently switched from gabapentin to pregabalin with improved pain.  Has appt with [place] pain mgmt next month.  Advised pt to speak with them prior to attempting shockwave therapy mentioned by his orthopedist.

## 2022-11-10 ENCOUNTER — Ambulatory Visit: Payer: Medicare Other | Admitting: Speech Pathology

## 2022-11-12 ENCOUNTER — Ambulatory Visit: Payer: Medicare Other

## 2022-11-12 DIAGNOSIS — R498 Other voice and resonance disorders: Secondary | ICD-10-CM | POA: Diagnosis not present

## 2022-11-12 DIAGNOSIS — J383 Other diseases of vocal cords: Secondary | ICD-10-CM | POA: Diagnosis not present

## 2022-11-12 NOTE — Patient Instructions (Addendum)
Feedback of loud "ahhh:" loud and strong WITHOUT strain, doesn't have to be as loud as possible   To add to your homework: Read 10-20 sentences in your normal pitch but strong voice   Instead of clearing your throat, take a sip of water and/or swallow your saliva hard

## 2022-11-12 NOTE — Therapy (Signed)
OUTPATIENT SPEECH LANGUAGE PATHOLOGY VOICE TREATMENT   Patient Name: [name] MRN: [id number] DOB:1945/03/26, 77 y.o., male Today's Date: 11/12/2022  PCP: Sandre Kitty MD,  REFERRING PROVIDER: Vladimir Faster, DO  END OF SESSION:  End of Session - 11/12/22 1233     Visit Number 2    Number of Visits 17    Date for SLP Re-Evaluation 12/31/22    Authorization Type Medicare    Progress Note Due on Visit 10    SLP Start Time 1233    SLP Stop Time  1315    SLP Time Calculation (min) 42 min    Activity Tolerance Patient tolerated treatment well              Past Medical History:  Diagnosis Date   Arthritis    hands    Cancer (HCC)    prostate cancer    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   Hyperlipidemia    Hypertension    Prostate cancer (HCC)    Recurrent upper respiratory infection (URI)    pt had a cold recent- week ago - better now    Tremor    Past Surgical History:  Procedure Laterality Date   CATARACT EXTRACTION Bilateral    LUMBAR LAMINECTOMY/DECOMPRESSION MICRODISCECTOMY Left 04/01/2018   Procedure: Left Lumbar four-lumbar five Microdiscectomy;  Surgeon: Maeola Harman, MD;  Location: [facility] OR;  Service: Neurosurgery;  Laterality: Left;   OTHER SURGICAL HISTORY  2005   right hand middle finger surgery    REPAIR OF CEREBROSPINAL FLUID LEAK N/A 05/18/2018   Procedure: Exploration of Lumbar four-five with repair of cerebrospinal fluid leak;  Surgeon: Maeola Harman, MD;  Location: [facility] OR;  Service: Neurosurgery;  Laterality: N/A;   ROBOT ASSISTED LAPAROSCOPIC RADICAL PROSTATECTOMY  01/13/2011   Procedure: ROBOTIC ASSISTED LAPAROSCOPIC RADICAL PROSTATECTOMY LEVEL 2;  Surgeon: Crecencio Mc, MD;  Location: [facility] ORS;  Service: Urology;  Laterality: Bilateral;  Robotic Assisted Laparoscopic Prostatetectomy with Bilateral Lymphadenectomy  Level 2   Patient Active Problem List   Diagnosis Date Noted   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   Neuralgia of both pudendal nerves 09/29/2022    Plantar callus 09/29/2022   Constipation 05/20/2022   Urinary incontinence 05/20/2022   Insomnia 05/20/2022   Restless leg 05/20/2022   Hypertension 02/26/2021   Herniated lumbar disc without myelopathy 04/01/2018   Essential tremor 01/24/2016   Prostate cancer (HCC) 03/10/2013    Onset date: 10/23/2022  REFERRING DIAG: R49.8 (ICD-10-CM) - 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 I don't have as much soreness"  Pt accompanied by: significant other Dianne  PERTINENT HISTORY: Trayon Kania is referred by Dr. Arbutus Leas who is seeing him for essential tremor. She notes low voice and spasmodic dysphonia. They held off on ENT referral as his last appointment with her. [name] and his wife note decline in voice, volume and intelligibility over the past 5 months. [name] is referred for neuropsych testing due to memory and slow processing.   PAIN:  Are you having pain? Yes: NPRS scale: 1-2/10 Pain location: throat Pain description: sore  FALLS: Has patient fallen in last 6 months? No,  LIVING ENVIRONMENT: Lives with: lives with their spouse Lives in: House/apartment  PLOF:Level of assistance: Independent with ADLs, Needed assistance with IADLS Employment: Retired  PATIENT GOALS: "To be heard"  OBJECTIVE:    COGNITION: Overall cognitive status:  Impaired Areas of impairment:  Memory: Impaired: Short term Functional deficits:   SOCIAL HISTORY: Occupation: retired Counsellor intake: optimal Caffeine/alcohol intake: minimal Daily voice use: minimal  PERCEPTUAL VOICE ASSESSMENT: Voice quality: hoarse, breathy, strained, low vocal intensity, vocal fatigue, and aphonic Vocal abuse:  n/a Resonance: normal Respiratory function: thoracic breathing  OBJECTIVE VOICE ASSESSMENT: Maximum phonation time for  sustained "ah": 6.42 Conversational loudness average: 62 dB Conversational loudness range: 56-67 dB  PATIENT REPORTED OUTCOME MEASURES (PROM): Communication Effectiveness Survey: 15/32. [name] rates a 2, or minimally effective communication with family at home, with strangers, over the phone, in a noisy environment, and in the car. He rates a 1 or not at all effective conversing across a room.  TODAY'S TREATMENT:                                                                                                                                          11/12/22: Reported mild reduction in throat soreness after daily completion of HEP (PhoRTE exercises). Demo'd exuberant voice exercises with occasional mod A to reduce vocal strain/decrease volume (initially >93 dB) during sustained phonation, maximize pitch range, and maintain effort levels. Noted mild vocal tremor during sustained phonation and difficulty gliding for pitch range. Performance mildly improved given SLP model and modifications. Provided throat clear alternatives given intermittent throat clearing exhibited. Scheduled to see ENT next week and will modify POC based on ENT observations and recommendations.   11/05/22 (eval day): Trials of exuberant voice therapy achieved improved volume and voice quality. [name] achieved and maintained 83dB average on sustained Ah! With clear phonation. He achieved WNL volume and clear voice on pitch glides as well as phonatory resistance sentences. Mild vocal tremor noted however this did not distract from his speech. Trained in HEP for hypophonia. He sees PCP later today. I instructed them to get a referral to Dr. Irene Pap, laryngologist. He does endorse pill dysphagia where pill gets stuck and he "brings it back up" several times a week. Consider MBSS   PATIENT EDUCATION: Education details: HEP for voice, vocal hygiene Person educated: Patient and Spouse Education method: Explanation, Demonstration, Verbal cues,  and Handouts Education comprehension: verbalized understanding, returned demonstration, verbal cues required, and needs further education  HOME EXERCISE PROGRAM: Phonation Resistance Training Exercises (PhoRTE), exuberant voice - will modify pending ENT consult  GOALS: Goals reviewed with patient? Yes  SHORT TERM GOALS: Target date: 12/03/22  Pt will complete HEP for voice with occasional min A over 1 week Baseline: Goal status: IN PROGRESS  2.  Pt will average 88dB on sustained vowel for 10 seconds with clear phonation Baseline:  Goal status: IN PROGRESS  3.  Pt will average 70dB 18/20 sentences with clear phonation and occasional min A Baseline:  Goal status: IN PROGRESS  4.  Pt will average 70dB with clear phonation over 8 minute conversation with occasional min A  Baseline:  Goal status: IN PROGRESS  5.  Pt will follow swallow precautions and diet modifications pending MBSS with occasional min A Baseline:  Goal status: IN PROGRESS   LONG TERM GOALS: Target date: 12/31/22  Pt will complete HEP for voice with mod I over 1 week Baseline:  Goal status: IN PROGRESS  2.  Pt will average 70 dB with clear phonation over 15 minute conversation with rare min A Baseline:  Goal status: IN PROGRESS  3.  Pt will be intelligible over 10 minute conversation in noisy environment with rare min A Baseline:  Goal status: IN PROGRESS  4.  Pt will improve score on Communicative Effectiveness Survey by 3 points Baseline: 15 Goal status: IN PROGRESS  5.  If MBSS indicated, pt will follow diet modifications and swallow precautions with rare min A Baseline:  Goal status: IN PROGRESS   ASSESSMENT:  CLINICAL IMPRESSION: Patient is a 77 y.o. male who was seen today for dysphonia and vocal tremor. [name] presents with moderate hypophonia, hoarse, raspy voice, and intermittent aphonia. Mild vocal tremor is present. He is accompanied by his spouse, Diane who also endorses low volume and  reduced intelligibility. [name] reports frequent requests to repeat himself.  He has limited phone use due to his volume/voice. [name] has had limited activity in the past year due to sacral pain, he is primarily reclined at home. They also endorse occasional choking/coughing during meals and [name] reports his pills "sometimes get stuck and I have to spit it  back up." This occurs a couple of times a week. [name] did get improved volume and improve vocal quality with exuberant voice and phonation resistance exercises, indicating a good candidate for ST. I recommend he see Dr. Irene Pap, laryngologist for imaging of larynx to r/o vocal pathology. Will consider MBSS and cognitive compensations (neuropsych testing pending). I recommend skilled ST to maximize intelligibility for safety, independence and to reduce caregiver burden.  OBJECTIVE IMPAIRMENTS: include memory, voice disorder, and dysphagia. These impairments are limiting patient from managing appointments, household responsibilities, effectively communicating at home and in community, and safety when swallowing. Factors affecting potential to achieve goals and functional outcome are medical prognosis.. Patient will benefit from skilled SLP services to address above impairments and improve overall function.  REHAB POTENTIAL: Good  PLAN:  SLP FREQUENCY: 2x/week  SLP DURATION: 8 weeks  PLANNED INTERVENTIONS: Aspiration precaution training, Pharyngeal strengthening exercises, Diet toleration management , Environmental controls, Trials of upgraded texture/liquids, Cueing hierachy, Cognitive reorganization, Internal/external aids, Oral motor exercises, Functional tasks, SLP instruction and feedback, Compensatory strategies, and Patient/family education, MBSS    Gracy Racer, CCC-SLP 11/12/2022, 2:59 PM

## 2022-11-17 ENCOUNTER — Encounter: Payer: Medicare Other | Admitting: Speech Pathology

## 2022-11-20 ENCOUNTER — Ambulatory Visit (INDEPENDENT_AMBULATORY_CARE_PROVIDER_SITE_OTHER): Payer: Medicare Other | Admitting: Otolaryngology

## 2022-11-20 ENCOUNTER — Encounter (INDEPENDENT_AMBULATORY_CARE_PROVIDER_SITE_OTHER): Payer: Self-pay | Admitting: Otolaryngology

## 2022-11-20 VITALS — BP 157/80 | HR 61 | Ht 71.0 in | Wt 171.0 lb

## 2022-11-20 DIAGNOSIS — R490 Dysphonia: Secondary | ICD-10-CM | POA: Diagnosis not present

## 2022-11-20 DIAGNOSIS — J383 Other diseases of vocal cords: Secondary | ICD-10-CM | POA: Diagnosis not present

## 2022-11-20 DIAGNOSIS — J385 Laryngeal spasm: Secondary | ICD-10-CM

## 2022-11-20 DIAGNOSIS — R251 Tremor, unspecified: Secondary | ICD-10-CM

## 2022-11-20 DIAGNOSIS — R1310 Dysphagia, unspecified: Secondary | ICD-10-CM | POA: Diagnosis not present

## 2022-11-20 DIAGNOSIS — K219 Gastro-esophageal reflux disease without esophagitis: Secondary | ICD-10-CM

## 2022-11-20 NOTE — Progress Notes (Signed)
ENT CONSULT:  Reason for Consult: Longstanding dysphonia/concern for SD and dysphagia   HPI: [name] is an 77 y.o. 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   He reports that his voice has been somewhat shaky for a long time but over the course of the last year he felt that he was difficult to project and breaks in his voice and shakiness in his voice worsened.  He was referred to have voice therapy with SLP by his neurologist.  Currently working with therapist and making progress.  Not sure if his voice improves with alcohol, singing.  He reports approximately 1 year of pill dysphagia which prompted him to have an esophagram in 2023 .Esophagram with esophageal dysmotility and trace penetration.  He reports a hard time with large vitamin pills and at 1 point, one of his capsules/tabs got stuck in the back of his throat, and he could not pass it.  It took 3 hrs to cough it back up. Denies choking on foods, able to tolerate regular diet.  Denies choking on liquids but has to drink it very slow.  He takes Gabapentin for chronic pain in pelvic area following surgical procedure and followed by urology.  Prior prostatectomy with radiation therapy and Lupron treatment.  Records reviewed Therapy notes by SLP [name] 11/05/22 [name] is referred by [name] who is seeing him for essential tremor. She notes low voice and spasmodic dysphonia. They held off on ENT referral as his last appointment with her. [name] and his wife note decline in voice, volume and intelligibility over the past 5 months. [name] is referred for neuropsych testing due to memory and slow processing.   11/05/22 (eval day): Trials of exuberant voice therapy achieved improved volume and voice quality. [name] achieved and maintained 83dB average on sustained Ah! With clear phonation. He achieved  WNL volume and clear voice on pitch glides as well as phonatory resistance sentences. Mild vocal tremor noted however this did not distract from his speech. Trained in HEP for hypophonia. He sees PCP later today. I instructed them to get a referral to Dr. Irene [name], laryngologist. He does endorse pill dysphagia where pill gets stuck and he "brings it back up" several times a week. Consider MBSS   Office note by [name] neurology 10/23/2022 1.  Essential Tremor, L>>R             -Patient would like to continue primidone, 50 mg, 4.5 tablets in the morning and 1 tablet at bedtime.  In the past, the patient tried to increase the dose but had side effects.  Lately, he has been trying to go back up and seems to be tolerating it.  He states that he is going to try and increase back to 250 mg in the morning.  He will let me know if he needs a new refill of the medication.             -Patient knows he has very few options short of DBS surgery or focused ultrasound and he isn't interested in that.  Discussed focused ultrasound in detail today. 2.  Spasmodic dysphonia             -Discussed nature and pathophysiology.  Discussed role of Botox.  Wants to hold on ENT referral             -Discussed speech therapy  for this and for slight hypophonia.  He would like to get the referral for that. 3.  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  Patient is following with urology.  Had hypogastric nerve ablation done by interventional radiology but it made it worse and now following with Dr. Lorrine [name] and pelvic floor physical therapy   4..  B12 deficiency             -pt with evidence of B12 deficiency. He started a supplement in May after showing low b12 but he needs to increase the dose to   5..  Memory change  -Doubt neurodegenerative  -check tsh -Schedule neurocognitive testing    Past Medical History:  Diagnosis Date   Arthritis    hands     Cancer (HCC)    prostate cancer    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   Hyperlipidemia    Hypertension    Prostate cancer (HCC)    Recurrent upper respiratory infection (URI)    pt had a cold recent- week ago - better now    Tremor     Past Surgical History:  Procedure Laterality Date   CATARACT EXTRACTION Bilateral    LUMBAR LAMINECTOMY/DECOMPRESSION MICRODISCECTOMY Left 04/01/2018   Procedure: Left Lumbar four-lumbar five Microdiscectomy;  Surgeon: David Harman, MD;  Location: [facility] OR;  Service: Neurosurgery;  Laterality: Left;   OTHER SURGICAL HISTORY  2005   right hand middle finger surgery    REPAIR OF CEREBROSPINAL FLUID LEAK N/A 05/18/2018   Procedure: Exploration of Lumbar four-five with repair of cerebrospinal fluid leak;  Surgeon: David Harman, MD;  Location: [facility] OR;  Service: Neurosurgery;  Laterality: N/A;   ROBOT ASSISTED LAPAROSCOPIC RADICAL PROSTATECTOMY  01/13/2011   Procedure: ROBOTIC ASSISTED LAPAROSCOPIC RADICAL PROSTATECTOMY LEVEL 2;  Surgeon: David Mc, MD;  Location: [facility] ORS;  Service: Urology;  Laterality: Bilateral;  Robotic Assisted Laparoscopic Prostatetectomy with Bilateral Lymphadenectomy  Level 2    Family History  Problem Relation Age of Onset   Heart attack Father    Hypertension Father    Cancer Mother        cervical with mets to lung   Hypertension Sister    Healthy Child     Social History:  reports that he has never smoked. He has never used smokeless tobacco. He reports that he does not currently use alcohol. He reports that he does not use drugs.  Allergies:  Allergies  Allergen Reactions   Imipramine Other (See Comments)   Lisinopril-Hydrochlorothiazide     Other reaction(s): cough?   Meloxicam     Other reaction(s): rash?... Able to tolerate Aleve without problems   Tape Other (See Comments) and Rash    Surgical tape=redness/rash at site    Medications: I have reviewed the patient's current medications.  The PMH, PSH, Medications, Allergies,  and SH were reviewed and updated.  ROS: Constitutional: Negative for fever, weight loss and weight gain. Cardiovascular: Negative for chest pain and dyspnea on exertion. Respiratory: Is not experiencing shortness of breath at rest. Gastrointestinal: Negative for nausea and vomiting. Neurological: Negative for headaches. Psychiatric: The patient is not nervous/anxious  Blood pressure (!) 157/80, pulse 61, height 5\' 11"  (1.803 m), weight 171 lb (77.6 kg), SpO2 98%.  PHYSICAL EXAM:  Exam: General: Well-developed, well-nourished Communication and Voice: Shaky voice with frequent spasms Respiratory Respiratory effort: Equal inspiration and expiration without stridor Cardiovascular Peripheral Vascular: Warm  extremities with equal color/perfusion Eyes: No nystagmus with equal extraocular motion bilaterally Neuro/Psych/Balance: Patient oriented to person, place, and time; Appropriate mood and affect; Gait is intact with no imbalance; Cranial nerves I-XII are intact Head and Face Inspection: Normocephalic and atraumatic without mass or lesion Palpation: Facial skeleton intact without bony stepoffs Salivary Glands: No mass or tenderness Facial Strength: Facial motility symmetric and full bilaterally ENT Pinna: External ear intact and fully developed External canal: Canal is patent with intact skin Tympanic Membrane: Clear and mobile External Nose: No scar or anatomic deformity Internal Nose: Septum is deviated to the left with significant narrowing of the nasal passage. No polyp, or purulence. Mucosal edema and erythema present.  Bilateral inferior turbinate hypertrophy.  Lips, Teeth, and gums: Mucosa and teeth intact and viable TMJ: No pain to palpation with full mobility Oral cavity/oropharynx: No erythema or exudate, no lesions present Nasopharynx: No mass or lesion with intact mucosa Hypopharynx: Intact mucosa without pooling of secretions Larynx Glottic: Full true vocal cord  mobility without lesion or mass with evidence of vocal fold atrophy Supraglottic: Normal appearing epiglottis and AE folds Interarytenoid Space: Moderate pachydermia edema Subglottic Space: Patent without lesion or edema Neck Neck and Trachea: Midline trachea without mass or lesion Thyroid: No mass or nodularity Lymphatics: No lymphadenopathy  Procedure: Summary of Video-Laryngeal-Stroboscopy: Bilateral vocal folds with significant vocal fold atrophy but normal mobility, no masses or lesions, evidence of predominantly vertical trauma with phonation, incomplete glottic closure, mucosal wave is present and intact appears symmetric with normal amplitude.  Moderate postcricoid edema and pachydermia  Preoperative diagnosis: hoarseness  Postoperative diagnosis:   same + laryngeal tremor + GERD LPR  Procedure: Flexible fiberoptic laryngoscopy with stroboscopy (95638)  Surgeon: Ashok Croon, MD  Anesthesia: Topical lidocaine and Afrin  Complications: None  Condition is stable throughout exam  Indications and consent:   The patient presents to the clinic with hoarseness. All the risks, benefits, and potential complications were reviewed with the patient preoperatively and informed verbal consent was obtained.  Procedure: The patient was seated upright in the exam chair.   Topical lidocaine and Afrin were applied to the nasal cavity. After adequate anesthesia had occurred, the flexible telescope was passed into the nasal cavity. The nasopharynx was patent without mass or lesion. The scope was passed behind the soft palate and directed toward the base of tongue. The base of tongue was visualized and was symmetric with no apparent masses or abnormal appearing tissue. There were no signs of a mass or pooling of secretions in the piriform sinuses. The supraglottic structures were normal.  The true vocal cords are mobile. The medial edges were bowed. Closure was incomplete with spindle-shaped gap.  Periodicity present. The mucosal wave and amplitude were intact and symmetric. There is moderate interarytenoid pachydermia and post cricoid edema. The mucosa appears without lesions.   The laryngoscope was then slowly withdrawn and the patient tolerated the procedure well. There were no complications or blood loss.     Studies Reviewed:esophagram 07/04/21 IMPRESSION: Mild esophageal dysmotility, likely presbyesophagus.   No other explanation for patient's symptoms.   Trace penetration identified on nondedicated hypopharyngeal evaluation. Correlate with patient's symptoms and possibly speech pathology evaluation.  Assessment/Plan: Encounter Diagnoses  Name Primary?   Laryngeal spasm Yes   Dysphonia of essential tremor    Vocal fold atrophy    Glottic insufficiency    Pill dysphagia    77 year old male with essential tremor on primidone followed by neurology and longstanding progressive symptoms of  dysphonia shaky voice poor projection and dysphagia primarily limited to pills.  Currently working with speech and appears to making some progress based on documentation reviewed.  Videostrobe today Bilateral vocal folds with significant vocal fold atrophy but normal mobility, no masses or lesions, evidence of predominantly vertical trauma with phonation, incomplete glottic closure, mucosal wave is present and intact appears symmetric with normal amplitude.  Moderate postcricoid edema and pachydermia  Laryngeal tremor and long-standing dysphonia worse ~ 12 mo -based on his exam I suspect laryngeal tremor affecting multiple muscle groups and exacerbated by age-related vocal fold atrophy -We discussed benefits of vocal fold augmentation and Botox -I advised to have vocal fold augmentation of prominent due to his diagnosis and recommended a trial of Botox if needed following vocal fold augmentation -Detailed description of both procedures provided to the patient -He will consider risks and  benefits of both procedures and will let us know at his follow-up if he would like to proceed - continue voice therapy 2.  Dysphagia -esophagram in 2023 with evidence of mild dysmotility and trace penetration, no recent modified barium swallow studies - MBS to rule out oropharyngeal dysphagia and aspiration 3. Essential tremor  -Follow-up with neurology as scheduled  RTC 4 to 6 weeks after MBS and to discuss laryngeal procedures  Thank you for allowing me to participate in the care of this patient. Please do not hesitate to contact me with any questions or concerns.   Ashok Croon, MD Otolaryngology [facility] Phone: [phone] Fax: [phone]    11/20/2022, 6:49 PM

## 2022-11-20 NOTE — Patient Instructions (Addendum)
- research laryngeal tremor online and procedures such as injection of a filler (hyaluronic acid) into the vocal folds and laryngeal botox  - we will send you procedure description via MyChart - continue with voice therapy and schedule swallow study - return in 4-6 weeks to discuss the interventions above    HYALURONIC ACID (RESTYLANE) INJECTION TO THE VOCAL CORD What is Hyaluronic Acid? Hyaluronic Acid is a synthetic substance. It contains a substance that is found in our subcutaneous tissue, or the soft tissue just beneath our skin. It has also been used in plastic surgery to fill in wrinkles or plump lips.    Hyaluronic Acid and the Vocal Cords Vocal cord problems are usually due to a lack of movement, bowing (loss of muscle) or weakness of the vocal cords. This lack of motion makes speech difficult. A lack of vocal cord motion can also cause:  Low, raspy or rough voice  Constant hoarseness  Breathing difficulty  Inability to speak above a whisper  Coughing or choking when swallowing Hyaluronic Acid increases the size or mass of the vocal cord where muscle loss has occurred. It acts like a space filler. Hyaluronic Acid is injected next to the vocal cords and into the muscles supporting the vocal cords. The body slowly reabsorbs it over time, and its effects usually last about 4-6 months, although this range varies for each patient.  Hyaluronic Acid Treatment You will be awake during the treatment and able to talk and breathe normally. Numbing medicines are used before the procedure to decrease the discomfort. The treatment will take about 5-10 minutes.    You should not eat a big meal before the injection.  In the office: You will sit up in a chair for the injection. A fiberoptic camera called a nasoendoscope is placed in a nostril and will go down to the base of your throat. This will allow the doctor to see your vocal cords. An injection of Hyaluronic Acid can be given in several  different ways, including via your mouth, directly into the surface of the vocal cord, or through the skin near your "Adam's apple" on your throat. If it is given through the skin, the lidocaine is injected into the skin to numb it before the injection.  If it is given via the mouth, the lining of the throat is numbed with liquid lidocaine first. We often offer a dose of valium to be taken before the procedure if you have someone who can drive you home afterward. Call the office if you would like this prescribed for you, then pick it up at your pharmacy and take it 1 hour before the procedure. Many patients choose not to take the valium, and they are able to drive themselves to and from the appointment.  In the operating room: You will be asleep under general anesthesia, with a breathing tube in. The procedure takes approximately 10 minutes and is done in a hospital or surgery center.  Risks include (but are not limited to) swelling, infection or allergic reaction, pain, extravasation into a more superficial layer of the vocal cord (making the voice more strained), inflammation, or failure to improve the voice. Rarely, additional procedures are needed to address these complications.  The voice is usually strained for a few days, then settles in by 4-7 days. Some patients may sound very good after the injection but find that their voice fades quickly, much earlier than the usual 4-6 months. These patients may benefit from a second injection  right away, and they should call the office.  If you have any trouble breathing or severe pain with swallowing or talking, you should call the office right away. It is not unusual to have a sore throat for a few days afterward, but if this soreness or pain is severe, you should call the office. You should always call the office for any questions or issues.  After a Hyaluronic Acid Treatment  You can talk right after your treatment.  If you had the procedure done in the  office, you should not eat or drink for 1 hour, or until the sensation in your throat completely returns to normal  You may have some discomfort or pain that radiates from your throat to your ear.  You will follow up with your doctor as instructed.  The Hyaluronic Acid procedure results can last from 3-6 months, but this is different for every patient.    Botulinum Toxin (Botox) for Voice Therapy Botox is used for the treatment of abnormal muscle spasms. It has been used in voice disorders for more than 20 years. Botox works by making the muscles weak or partially paralyzing them.   Some common voice disorders treated with botox include: Spasmodic dysphonia (SD) is an involuntary contraction of the muscle that happens while speaking. This causes a strained sound to the voice or the voice to "cut out". In mild cases it will cause an unstable voice with a change in pitch. Essential tremor causes shakiness to the voice that can also affect the head and hands. Shaking in the hands and head can be treated with a pill but it usually doesn't work well for the voice.  How is Botox Given? Botox is given by an injection into the muscle. The amount of medicine used for this treatment is very small. After a Botox injection, there is little change for the first 24-72 hours. The medicine usually starts to work in 3 to 7 days. It works best in 2 to 4 weeks after the injection. It will last up to 3-4 months. Since the medication wears off over time, the injections need to be repeated every few months depending on your response to the medication and your treatment plan.   All patients are different and the treatment will be planned to treat your problem. Some patients do better when both vocal cords are injected at the same time. Other patients have better results when only one vocal cord is injected. The amount and frequency of medicine is different for all patients. The treatments are adjusted to help you get the  best voice possible. You will come to the clinic for the treatment. You can eat before and after the injection. No blood work is required before the injection. Botox acts in the area it is injected only. It should not interact with any medicine.  Possible Side Effects of the Treatment Generally, this is a very safe treatment. The possible side effects include: A breathy voice at first, which usually evens out after the first couple of weeks. In other words, your voice may get worse before it gets better. The voice may become weak and "breathy". This usually lasts 1 to 2 weeks but it can last up to 1 month. Difficulty swallowing occurs sometimes, also usually for only the first couple days or weeks. Most of the time this is very mild and does not require you to change the food you eat. It may be most noticeable with thin liquids.   Mild "cold-like"  symptoms (this is rare but reported).    Botox does not work for everyone. Some patients will continue to have voice problems after the treatment. Or sometimes the voice quality doesn't improve but speaking feels easier and less difficult or strained.  What can I expect during the treatment? The injection is not very painful and can be compared to having your blood drawn, except that we use a numbing medication in addition. The injection is done using electromyography (EMG) guidance. The EMG machine measures muscle activity and will help make sure that the medication is placed in the area of the muscle with the most activity or spasm.   Most patients are sitting for the injection but some may prefer to lie down. The neck is cleaned with alcohol. An injection (Lidocaine) is given to numb the area. This may cause a light burning feeling at the injection site. You will be asked to say a vowel (E) in order to make the muscle contract. The Botox is attached to the EMG needle. The needle is directed into the muscle just below the voice box. The needle does not have  to be inserted very far (less than 1 inch depending on the thickness of the neck). Most injections take just a few minutes, often less than 60 seconds per side. You should try not to talk or swallow during the injection. This can move the needle out of place. The injection of the Botox may cause pressure or burning in the throat that may be felt in the ear. This is because the voice box and the ear share a nerve.  What can I expect after the treatment? You should not notice much difference the day of the injection or even the next 2-3 days after. Starting a few days after the injection you should be careful how you eat, and be particularly careful when swallowing thin liquids like water or coffee. You may start to notice a breathier voice, sometimes even worse than before the injection, about 3 days after the injection. This usually lasts for 1-2 weeks. Take small bites and sips. Make sure you do not have a problem with swallowing or choking, and if you do, be sure not to talk while eating and focus fully on swallowing, especially with thin liquids like water or coffee.   Chew your food very well. If you do have any changes in swallowing they usually last about 1-2 weeks.   If you have severe choking or swallowing problems, call your doctor.  Important Things to Tell Your Doctor: If you are receiving botox for the first time, it's very important to keep good notes on the initial effects, both good and bad.  This will help your doctor determine the correct dose for you. You will be given a sheet (usually bright orange) on which you will circle a "percentage of normal function" each day for both your voice and swallow, up to 14 days after the injection.  From that point, you will circle a percentage of normal function for your voice and swallow just once a week. It's very important to bring this sheet with you to your next appointment. Consider taking a picture of it with your phone from time to time so you  will have at least some of the information in case you forget to take it to the appointment. Your dose of Botox will be adjusted to get the best voice with the fewest side effects.   After the injection, you doctor will use  the sheet to find out how long after the treatment you had: Swallowing problems Weak or breathy sounding voice A better voice

## 2022-11-21 DIAGNOSIS — G894 Chronic pain syndrome: Secondary | ICD-10-CM | POA: Insufficient documentation

## 2022-11-21 DIAGNOSIS — R937 Abnormal findings on diagnostic imaging of other parts of musculoskeletal system: Secondary | ICD-10-CM | POA: Insufficient documentation

## 2022-11-21 DIAGNOSIS — G588 Other specified mononeuropathies: Secondary | ICD-10-CM | POA: Insufficient documentation

## 2022-11-21 DIAGNOSIS — Z789 Other specified health status: Secondary | ICD-10-CM | POA: Insufficient documentation

## 2022-11-21 DIAGNOSIS — M899 Disorder of bone, unspecified: Secondary | ICD-10-CM | POA: Insufficient documentation

## 2022-11-21 DIAGNOSIS — Z79899 Other long term (current) drug therapy: Secondary | ICD-10-CM | POA: Insufficient documentation

## 2022-11-21 NOTE — Patient Instructions (Signed)

## 2022-11-21 NOTE — Progress Notes (Unsigned)
Patient: [name]  Service Category: E/M  Provider: Oswaldo Done, MD  DOB: 1945-10-16  DOS: 11/26/2022  Referring Provider: Sandre Kitty, MD  MRN: [id number]  Setting: Ambulatory outpatient  PCP: Sandre Kitty, MD  Type: New Patient  Specialty: Interventional Pain Management    Location: Office  Delivery: Face-to-face     Primary Reason(s) for Visit: Encounter for initial evaluation of one or more chronic problems (new to examiner) potentially causing chronic pain, and posing a threat to normal musculoskeletal function. (Level of risk: High) CC: No chief complaint on file.  HPI  [name] is a 77 y.o. year old, male patient, who comes for the first time to our practice referred by Sandre Kitty, MD for our initial evaluation of his chronic pain. He has Prostate CA (HCC); Essential tremor; Herniated lumbar disc without myelopathy; Hypertension; Chronic constipation; Urinary incontinence; Insomnia; Restless leg; Neuralgia of both pudendal nerves; Plantar callus; Spasmodic dysphonia; Acquired cyst of kidney; Chronic pain syndrome; Degenerative lumbar spinal stenosis; Erythema of skin; Fracture of rib; Hip pain; Lumbar radiculopathy; Male urinary stress incontinence; Pelvic and perineal pain; Pudendal neuralgia; Rib pain; Status post implantation of artificial urinary sphincter; Erectile dysfunction 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; Postoperative cerebrospinal fluid leak; Displacement of lumbar intervertebral disc without myelopathy; Abnormal MRI, lumbar spine; Pharmacologic therapy; Disorder of skeletal system; and Problems influencing health status on their problem list. Today he comes in for evaluation of his No chief complaint on file.  Pain Assessment: Location:     Radiating:   Onset:   Duration:   Quality:   Severity:  /10 (subjective, self-reported pain score)  Effect on ADL:   Timing:   Modifying factors:   BP:    HR:    Onset and Duration: {Hx; Onset and  Duration:210120511} Cause of pain: {Hx; Cause:210120521} Severity: {Pain Severity:210120502} Timing: {Symptoms; Timing:210120501} Aggravating Factors: {Causes; Aggravating pain factors:210120507} Alleviating Factors: {Causes; Alleviating Factors:210120500} Associated Problems: {Hx; Associated problems:210120515} Quality of Pain: {Hx; Symptom quality or Descriptor:210120531} Previous Examinations or Tests: {Hx; Previous examinations or test:210120529} Previous Treatments: {Hx; Previous Treatment:210120503}  [name] is being evaluated for possible interventional pain management therapies for the treatment of his chronic pain.   ***  [name] has been informed that this initial visit was an evaluation only.  On the follow up appointment I will go over the results, including ordered tests and available interventional therapies. At that time he will have the opportunity to decide whether to proceed with offered therapies or not. In the event that [name] prefers avoiding interventional options, this will conclude our involvement in the case.  Medication management recommendations may be provided upon request.  Patient informed that diagnostic tests may be ordered to assist in identifying underlying causes, narrow the list of differential diagnoses and aid in determining candidacy for (or contraindications to) planned therapeutic interventions.  Historic Controlled Substance Pharmacotherapy Review  PMP and historical list of controlled substances: Pregabalin 100 Mg Capsule ; Gabapentin 300 Mg Capsule ;Zolpidem Tart Er 12.5 Mg Tab ;Lorazepam 0.5 Mg Table  Most recently prescribed opioid analgesics:   *** MME/day: *** mg/day  Historical Monitoring: The patient  reports no history of drug use. List of prior UDS Testing: No results found for: "MDMA", "COCAINSCRNUR", "PCPSCRNUR", "PCPQUANT", "CANNABQUANT", "THCU", "ETH", "CBDTHCR", "D8THCCBX", "D9THCCBX" Historical Background Evaluation: [place]  PMP: PDMP reviewed during this encounter. Review of the past 1-months conducted.             PMP NARX Score Report:  Narcotic: 190 Sedative: 482 Stimulant: 000 [place] Department of public safety, offender search: Engineer, mining Information) Non-contributory Risk Assessment Profile: Aberrant behavior: None observed or detected today Risk factors for fatal opioid overdose: None identified today PMP NARX Overdose Risk Score: 090 Fatal overdose hazard ratio (HR): Calculation deferred Non-fatal overdose hazard ratio (HR): Calculation deferred Risk of opioid abuse or dependence: 0.7-3.0% with doses <= 36 MME/day and 6.1-26% with doses >= 120 MME/day. Substance use disorder (SUD) risk level: See below Personal History of Substance Abuse (SUD-Substance use disorder):  Alcohol:    Illegal Drugs:    Rx Drugs:    ORT Risk Level calculation:    ORT Scoring interpretation table:  Score <3 = Low Risk for SUD  Score between 4-7 = Moderate Risk for SUD  Score >8 = High Risk for Opioid Abuse   PHQ-2 Depression Scale:  Total score:    PHQ-2 Scoring interpretation table: (Score and probability of major depressive disorder)  Score 0 = No depression  Score 1 = 15.4% Probability  Score 2 = 21.1% Probability  Score 3 = 38.4% Probability  Score 4 = 45.5% Probability  Score 5 = 56.4% Probability  Score 6 = 78.6% Probability   PHQ-9 Depression Scale:  Total score:    PHQ-9 Scoring interpretation table:  Score 0-4 = No depression  Score 5-9 = Mild depression  Score 10-14 = Moderate depression  Score 15-19 = Moderately severe depression  Score 20-27 = Severe depression (2.4 times higher risk of SUD and 2.89 times higher risk of overuse)   Pharmacologic Plan: As per protocol, I have not taken over any controlled substance management, pending the results of ordered tests and/or consults.            Initial impression: Pending review of available data and ordered tests.  Meds   Current Outpatient  Medications:    acetaminophen (TYLENOL) 325 MG tablet, Take 325 mg by mouth every 6 (six) hours as needed for mild pain, moderate pain, fever or headache., Disp: , Rfl:    amLODipine (NORVASC) 2.5 MG tablet, Take 1 tablet (2.5 mg total) by mouth daily., Disp: 90 tablet, Rfl: 3   ascorbic acid (VITAMIN C) 1000 MG tablet, Take 800 mg by mouth daily. Pt takes 5 times a week, Disp: , Rfl:    gabapentin (NEURONTIN) 300 MG capsule, Take 600 mg by mouth 3 (three) times daily. (Patient not taking: Reported on 11/20/2022), Disp: , Rfl:    LORazepam (ATIVAN) 0.5 MG tablet, Take 0.5 mg by mouth as directed. For Anxiety, Disp: , Rfl:    losartan (COZAAR) 100 MG tablet, Take 1 tablet (100 mg total) by mouth daily., Disp: 90 tablet, Rfl: 3   Magnesium Oxide -Mg Supplement 200 MG TABS, TAKE 1 TABLET BY MOUTH IN THE MORNING AND AT BEDTIME, Disp: 60 tablet, Rfl: 0   Multiple Vitamin (MULTIVITAMIN WITH MINERALS) TABS tablet, Take 1 tablet by mouth 2 (two) times a week. (Patient not taking: Reported on 11/20/2022), Disp: , Rfl:    primidone (MYSOLINE) 250 MG tablet, Take 1 tablet (250 mg total) by mouth in the morning., Disp: 90 tablet, Rfl: 1   primidone (MYSOLINE) 50 MG tablet, Pt. Takes 5 tablets 100 mg in the AM, and 1 tablet 50 mg in the PM, Disp: 540 tablet, Rfl: 0   psyllium (REGULOID) 0.52 g capsule, Take 0.52 g by mouth daily. 2 capsules by mouth twice daily, Disp: , Rfl:    rosuvastatin (CRESTOR) 10 MG tablet, Take 1  tablet (10 mg total) by mouth daily., Disp: 90 tablet, Rfl: 3   zolpidem (AMBIEN CR) 12.5 MG CR tablet, Take by mouth at bedtime. 1/2 to 1 tablet daily at bedtime, Disp: , Rfl:   Imaging Review  Lumbosacral Imaging: Lumbar MR wo contrast: Results for orders placed during the hospital encounter of 02/16/18 MR LUMBAR SPINE WO CONTRAST  Narrative CLINICAL DATA:  Lumbar radiculopathy. Low back and LEFT leg pain. Prostate cancer, status post radiotherapy.  EXAM: MRI LUMBAR SPINE WITHOUT  CONTRAST  TECHNIQUE: Multiplanar, multisequence MR imaging of the lumbar spine was performed. No intravenous contrast was administered.  COMPARISON:  None.  FINDINGS: Segmentation:  Standard.  Alignment:  Anatomic  Vertebrae: Post treatment change, fatty replacement of the marrow, mid L4, L5, and S1 in this patient status post salvage radiotherapy for prostate cancer. No concerning features for lumbosacral metastatic disease.  Conus medullaris and cauda equina: Conus extends to the L1 level. Conus and cauda equina appear normal.  Paraspinal and other soft tissues: No pelvic mass or visible adenopathy. No hydronephrosis.  Disc levels:  L1-L2:  Unremarkable.  L2-L3: Central protrusion. Facet arthropathy. No definite impingement.  L3-L4: Central and leftward protrusion. Posterior element hypertrophy. LEFT subarticular zone narrowing could affect the L4 nerve root. No compressive foraminal narrowing although a leftward foraminal protrusion is observed.  L4-L5: Focal leftward extrusion. Posterior element hypertrophy is superimposed. Mild stenosis. Severe LEFT L5 neural impingement.  L5-S1: Annular bulge. Posterior element hypertrophy. No compressive subarticular zone or foraminal zone narrowing.  IMPRESSION: The dominant LEFT-sided finding is at L4-5, where a leftward disc extrusion results in significant subarticular zone narrowing and compression of the LEFT L5 nerve root. Posterior element hypertrophy is superimposed, contributing to mild stenosis.  Similar less severe central and leftward protrusion at L3-4. LEFT L4 neural impingement. Foraminal disc material on the LEFT does not clearly affect the L3 nerve root.  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   Electronically Signed By: Elsie Stain M.D. On: 02/16/2018 08:53  Lumbar DG 2-3 views: Results for orders placed during the hospital  encounter of 04/01/18 DG Lumbar Spine 2-3 Views  Narrative CLINICAL DATA:  Localization films.  EXAM: LUMBAR SPINE - 2-3 VIEW  COMPARISON:  MRI of the lumbosacral spine 02/16/2018  FINDINGS: Lateral radiographs of the lumbosacral spine demonstrate localization instrument and tissue retractor posterior to the L5 spinous process.  IMPRESSION: Surgical instruments posterior to L5 spinous process.   Electronically Signed By: Ted Mcalpine M.D. On: 04/01/2018 13:08  Hip Imaging: Hip-R MR wo contrast: Results for orders placed during the hospital encounter of 06/15/20 MR HIP RIGHT WO CONTRAST  Narrative CLINICAL DATA:  Right hip pain for 6 days  EXAM: MR OF THE RIGHT HIP WITHOUT CONTRAST  TECHNIQUE: Multiplanar, multisequence MR imaging was performed. No intravenous contrast was administered.  COMPARISON:  None.  FINDINGS: Bones:  No hip fracture, dislocation or avascular necrosis. No periosteal reaction or bone destruction. No aggressive osseous lesion.  Normal sacrum and sacroiliac joints. No SI joint widening or erosive changes.  Marrow edema in the right pubic body concerning for stress reaction without a fracture.  Articular cartilage and labrum  Articular cartilage: Partial-thickness cartilage loss of the right femoral head and acetabulum with marginal osteophytes. Mild partial-thickness cartilage loss of the left femoral head and acetabulum.  Labrum: Severe right labral degeneration with a superior labral tear. Severe left superior labral degeneration.  Joint or bursal effusion  Joint effusion:  No hip joint effusion.  No SI joint effusion.  Bursae: No bursa formation.  Muscles and tendons  Flexors: Normal.  Extensors: Normal.  Abductors: Normal.  Adductors: Normal.  Gluteals: Normal.  Hamstrings: Normal.  Other findings  No pelvic free fluid. No fluid collection or hematoma. No inguinal lymphadenopathy. No inguinal  hernia.  IMPRESSION: 1. Moderate osteoarthritis of the right hip. 2. Mild osteoarthritis of the left hip. 3. Severe right labral degeneration with a superior labral tear. Severe left superior labral degeneration. 4. Marrow edema in the right pubic body concerning for stress reaction without a fracture.   Electronically Signed By: Elige Ko On: 06/16/2020 13:17  Hip-L DG 2-3 views: Results for orders placed during the hospital encounter of 01/07/16 DG HIP UNILAT WITH PELVIS 2-3 VIEWS LEFT  Narrative CLINICAL DATA:  Left posterior hip pain for 6 weeks.  EXAM: DG HIP (WITH OR WITHOUT PELVIS) 2-3V LEFT  COMPARISON:  None.  FINDINGS: There is no evidence of fracture or dislocation. Hip joint space width is preserved without significant arthropathic change identified. A 6 mm well corticated ossicle projects adjacent to the superolateral margin of the acetabulum. No suspicious osseous lesion or soft tissue abnormality is seen.  IMPRESSION: No acute osseous abnormality or significant arthropathic change.   Electronically Signed By: Sebastian Ache M.D. On: 01/07/2016 14:22  Complexity Note: Imaging results reviewed.                         ROS  Cardiovascular: {Hx; Cardiovascular History:210120525} Pulmonary or Respiratory: {Hx; Pumonary and/or Respiratory History:210120523} Neurological: {Hx; Neurological:210120504} Psychological-Psychiatric: {Hx; Psychological-Psychiatric History:210120512} Gastrointestinal: {Hx; Gastrointestinal:210120527} Genitourinary: {Hx; Genitourinary:210120506} Hematological: {Hx; Hematological:210120510} Endocrine: {Hx; Endocrine history:210120509} Rheumatologic: {Hx; Rheumatological:210120530} Musculoskeletal: {Hx; Musculoskeletal:210120528} Work History: {Hx; Work history:210120514}  Allergies  Mr. Eisel is allergic to imipramine, lisinopril-hydrochlorothiazide, meloxicam, and tape.  Laboratory Chemistry Profile   Renal Lab Results   Component Value Date   BUN 20 09/24/2022   CREATININE 0.85 09/24/2022   BCR 21 05/08/2022   GFRAA >60 04/29/2019   GFRNONAA >60 09/24/2022     Electrolytes Lab Results  Component Value Date   NA 137 09/24/2022   K 4.0 09/24/2022   CL 105 09/24/2022   CALCIUM 9.7 09/24/2022     Hepatic Lab Results  Component Value Date   AST 17 09/24/2022   ALT 12 09/24/2022   ALBUMIN 4.1 09/24/2022   ALKPHOS 54 09/24/2022     ID Lab Results  Component Value Date   STAPHAUREUS NEGATIVE 03/24/2018   MRSAPCR NEGATIVE 03/24/2018     Bone Lab Results  Component Value Date   TESTOSTERONE 83 (L) 06/29/2018     Endocrine Lab Results  Component Value Date   GLUCOSE 115 (H) 09/24/2022   TSH 3.05 10/23/2022   TESTOSTERONE 83 (L) 06/29/2018     Neuropathy Lab Results  Component Value Date   VITAMINB12 254 07/17/2022     CNS No results found for: "COLORCSF", "APPEARCSF", "RBCCOUNTCSF", "WBCCSF", "POLYSCSF", "LYMPHSCSF", "EOSCSF", "PROTEINCSF", "GLUCCSF", "JCVIRUS", "CSFOLI", "IGGCSF", "LABACHR", "ACETBL"   Inflammation (CRP: Acute  ESR: Chronic) No results found for: "CRP", "ESRSEDRATE", "LATICACIDVEN"   Rheumatology No results found for: "RF", "ANA", "LABURIC", "URICUR", "LYMEIGGIGMAB", "LYMEABIGMQN", "HLAB27"   Coagulation Lab Results  Component Value Date   PLT 178 09/24/2022     Cardiovascular Lab Results  Component Value Date   HGB 12.4 (L) 09/24/2022   HCT 36.8 (L) 09/24/2022     Screening Lab Results  Component Value Date   STAPHAUREUS NEGATIVE 03/24/2018   MRSAPCR NEGATIVE 03/24/2018     Cancer No results found for: "CEA", "CA125", "LABCA2"   Allergens No results found for: "ALMOND", "APPLE", "ASPARAGUS", "AVOCADO", "BANANA", "BARLEY", "BASIL", "BAYLEAF", "GREENBEAN", "LIMABEAN", "WHITEBEAN", "BEEFIGE", "REDBEET", "BLUEBERRY", "BROCCOLI", "CABBAGE", "MELON", "CARROT", "CASEIN", "CASHEWNUT", "CAULIFLOWER", "CELERY"     Note: Lab results reviewed.  PFSH   Drug: Mr. Lawhun  reports no history of drug use. Alcohol:  reports that he does not currently use alcohol. Tobacco:  reports that he has never smoked. He has never used smokeless tobacco. Medical:  has a past medical history of Arthritis, Cancer (HCC), CSF leak (05/18/2018), Hyperlipidemia, Hypertension, Prostate cancer (HCC), Recurrent upper respiratory infection (URI), and Tremor. Family: family history includes Cancer in his mother; Healthy in his child; Heart attack in his father; Hypertension in his father and sister.  Past Surgical History:  Procedure Laterality Date   CATARACT EXTRACTION Bilateral    LUMBAR LAMINECTOMY/DECOMPRESSION MICRODISCECTOMY Left 04/01/2018   Procedure: Left Lumbar four-lumbar five Microdiscectomy;  Surgeon: Maeola Harman, MD;  Location: [facility] OR;  Service: Neurosurgery;  Laterality: Left;   OTHER SURGICAL HISTORY  2005   right hand middle finger surgery    REPAIR OF CEREBROSPINAL FLUID LEAK N/A 05/18/2018   Procedure: Exploration of Lumbar four-five with repair of cerebrospinal fluid leak;  Surgeon: Maeola Harman, MD;  Location[facility] OR;  Service: Neurosurgery;  Laterality: N/A;   ROBOT ASSISTED LAPAROSCOPIC RADICAL PROSTATECTOMY  01/13/2011   Procedure: ROBOTIC ASSISTED LAPAROSCOPIC RADICAL PROSTATECTOMY LEVEL 2;  Surgeon: Crecencio Mc, MD;  Location: [facility] ORS;  Service: Urology;  Laterality: Bilateral;  Robotic Assisted Laparoscopic Prostatetectomy with Bilateral Lymphadenectomy  Level 2   Active Ambulatory Problems    Diagnosis Date Noted   Prostate CA (HCC) 03/10/2013   Essential tremor 01/24/2016   Herniated lumbar disc without myelopathy 04/01/2018   Hypertension 02/26/2021   Chronic constipation 05/20/2022   Urinary incontinence 05/20/2022   Insomnia 05/20/2022   Restless leg 05/20/2022   Neuralgia of both pudendal nerves 09/29/2022   Plantar callus 09/29/2022   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   Acquired cyst of kidney 07/03/2021   Chronic pain syndrome  11/21/2022   Degenerative lumbar spinal stenosis 05/01/2017   Erythema of skin 07/24/2020   Fracture of rib 07/24/2020   Hip pain 05/01/2017   Lumbar radiculopathy 03/03/2018   Male urinary stress incontinence 07/03/2021   Pelvic and perineal pain 01/31/2022   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   Rib pain 07/24/2020   Status post implantation of artificial urinary sphincter 11/11/2021   Erectile dysfunction 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 07/03/2021   Postoperative cerebrospinal fluid leak 05/17/2018   Displacement of lumbar intervertebral disc without myelopathy 03/03/2018   Abnormal MRI, lumbar spine 11/21/2022   Pharmacologic therapy 11/21/2022   Disorder of skeletal system 11/21/2022   Problems influencing health status 11/21/2022   Resolved Ambulatory Problems    Diagnosis Date Noted   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   Past Medical History:  Diagnosis Date   Arthritis    Cancer (HCC)    Hyperlipidemia    Prostate cancer (HCC)    Recurrent upper respiratory infection (URI)    Tremor    Constitutional Exam  General appearance: Well nourished, well developed, and well hydrated. In no apparent acute distress There were no vitals filed for this visit. BMI Assessment: Estimated body mass index is 23.85 kg/m as calculated from the following:   Height as of 11/20/22: 5\' 11"  (1.803 m).   Weight as of  11/20/22: 171 lb (77.6 kg).  BMI interpretation table: BMI level Category Range association with higher incidence of chronic pain  <18 kg/m2 Underweight   18.5-24.9 kg/m2 Ideal body weight   25-29.9 kg/m2 Overweight Increased incidence by 20%  30-34.9 kg/m2 Obese (Class I) Increased incidence by 68%  35-39.9 kg/m2 Severe obesity (Class II) Increased incidence by 136%  >40 kg/m2 Extreme obesity (Class III) Increased incidence by 254%   Patient's current BMI Ideal Body weight  There is no height or weight on file to calculate BMI. Ideal body weight: 75.3 kg (166 lb 0.1 oz) Adjusted ideal body  weight: 76.2 kg (168 lb 0.1 oz)   BMI Readings from Last 4 Encounters:  11/20/22 23.85 kg/m  11/05/22 23.88 kg/m  10/23/22 24.55 kg/m  09/29/22 25.37 kg/m   Wt Readings from Last 4 Encounters:  11/20/22 171 lb (77.6 kg)  11/05/22 171 lb 3.2 oz (77.7 kg)  10/23/22 176 lb (79.8 kg)  09/29/22 176 lb 12.8 oz (80.2 kg)    Psych/Mental status: Alert, oriented x 3 (person, place, & time)       Eyes: PERLA Respiratory: No evidence of acute respiratory distress  Assessment  Primary Diagnosis & Pertinent Problem List: The primary encounter diagnosis was Chronic pain syndrome. Diagnoses of Pharmacologic therapy, Disorder of skeletal system, and Problems influencing health status were also pertinent to this visit.  Visit Diagnosis (New problems to examiner): 1. Chronic pain syndrome   2. Pharmacologic therapy   3. Disorder of skeletal system   4. Problems influencing health status    Plan of Care (Initial workup plan)  Note: Mr. Rinaldo was reminded that as per protocol, today's visit has been an evaluation only. We have not taken over the patient's controlled substance management.  Problem-specific plan: No problem-specific Assessment & Plan notes found for this encounter.  Lab Orders  No laboratory test(s) ordered today   Imaging Orders  No imaging studies ordered today   Referral Orders  No referral(s) requested today   Procedure Orders    No procedure(s) ordered today   Pharmacotherapy (current): Medications ordered:  No orders of the defined types were placed in this encounter.  Medications administered during this visit: Hester L. Baumgarten had no medications administered during this visit.   Analgesic Pharmacotherapy:  Opioid Analgesics: For patients currently taking or requesting to take opioid analgesics, in accordance with [facility] Guidelines, we will assess their risks and indications for the use of these substances. After completing our  evaluation, we may offer recommendations, but we no longer take patients for medication management. The prescribing physician will ultimately decide, based on his/her training and level of comfort whether to adopt any of the recommendations, including whether or not to prescribe such medicines.  Membrane stabilizer: To be determined at a later time  Muscle relaxant: To be determined at a later time  NSAID: To be determined at a later time  Other analgesic(s): To be determined at a later time   Interventional management options: Mr. Shambach was informed that there is no guarantee that he would be a candidate for interventional therapies. The decision will be based on the results of diagnostic studies, as well as Mr. Marez's risk profile.  Procedure(s) under consideration:  Pending results of ordered studies      Interventional Therapies  Risk Factors  Considerations  Medical Comorbidities:     Planned  Pending:      Under consideration:   Pending   Completed:  None at this time   Therapeutic  Palliative (PRN) options:   None established   Completed by other providers:   None reported       Provider-requested follow-up: No follow-ups on file.  Future Appointments  Date Time Provider Department Center  11/24/2022 10:15 AM Gracy Racer, CCC-SLP [facility]  11/26/2022 11:00 AM Delano Metz, MD [facility] None  11/26/2022  2:00 PM Lovvorn, Sherrell Puller [facility]  12/01/2022 11:10 AM Sandre Kitty, MD [facility] None  12/01/2022  1:15 PM Lovvorn, Gerome Apley, CCC-SLP [facility]  12/02/2022  1:00 PM Tat, Octaviano Batty, DO [facility] None  12/03/2022 12:30 PM Lovvorn, Gerome Apley, CCC-SLP [facility]  12/08/2022 12:30 PM Lovvorn, Gerome Apley, CCC-SLP [facility]  12/10/2022  2:00 PM Lovvorn, Gerome Apley, CCC-SLP [facility]  12/15/2022  2:00 PM Lovvorn, Gerome Apley, CCC-SLP [facility]  12/17/2022  2:00 PM Lovvorn, Gerome Apley, CCC-SLP [facility]  12/22/2022   2:00 PM Lovvorn, Lauris Poag [facility]  01/08/2023  2:45 PM Ashok Croon, MD [facility] None  03/10/2023 11:00 AM [facility]-ANNUAL WELLNESS VISIT [facility] None  03/26/2023 10:00 AM [facility] LAB [facility] None  04/03/2023  3:00 PM Briant Cedar, PA-C [facility] None  04/28/2023  2:30 PM Tat, Octaviano Batty, DO [facility] None  06/05/2023  8:30 AM Rosann Auerbach, PhD [facility] None  06/05/2023  9:30 AM [facility]- NEUROPSYCH TECH [facility] None  06/11/2023 10:00 AM Rosann Auerbach, PhD [facility] None    Duration of encounter: *** minutes.  Total time on encounter, as per AMA guidelines included both the face-to-face and non-face-to-face time personally spent by the physician and/or other qualified health care professional(s) on the day of the encounter (includes time in activities that require the physician or other qualified health care professional and does not include time in activities normally performed by clinical staff). Physician's time may include the following activities when performed: Preparing to see the patient (e.g., pre-charting review of records, searching for previously ordered imaging, lab work, and nerve conduction tests) Review of prior analgesic pharmacotherapies. Reviewing PMP Interpreting ordered tests (e.g., lab work, imaging, nerve conduction tests) Performing post-procedure evaluations, including interpretation of diagnostic procedures Obtaining and/or reviewing separately obtained history Performing a medically appropriate examination and/or evaluation Counseling and educating the patient/family/caregiver Ordering medications, tests, or procedures Referring and communicating with other health care professionals (when not separately reported) Documenting clinical information in the electronic or other health record Independently interpreting results (not separately reported) and communicating results to the patient/ family/caregiver Care coordination (not separately  reported)  Note by: Oswaldo Done, MD (TTS technology used. I apologize for any typographical errors that were not detected and corrected.) Date: 11/26/2022; Time: 12:35 PM

## 2022-11-24 ENCOUNTER — Ambulatory Visit: Payer: Medicare Other

## 2022-11-24 ENCOUNTER — Other Ambulatory Visit (HOSPITAL_COMMUNITY): Payer: Self-pay | Admitting: *Deleted

## 2022-11-24 DIAGNOSIS — R498 Other voice and resonance disorders: Secondary | ICD-10-CM

## 2022-11-24 DIAGNOSIS — R059 Cough, unspecified: Secondary | ICD-10-CM

## 2022-11-24 DIAGNOSIS — R1310 Dysphagia, unspecified: Secondary | ICD-10-CM

## 2022-11-24 DIAGNOSIS — J383 Other diseases of vocal cords: Secondary | ICD-10-CM | POA: Diagnosis not present

## 2022-11-24 NOTE — Therapy (Signed)
OUTPATIENT SPEECH LANGUAGE PATHOLOGY VOICE TREATMENT   Patient Name: [name] MRN: [id number] DOB:04-23-1945, 77 y.o., male Today's Date: 11/24/2022  PCP: Sandre Kitty MD,  REFERRING PROVIDER: Vladimir Faster, DO  END OF SESSION:  End of Session - 11/24/22 1146     Visit Number 3    Number of Visits 17    Date for SLP Re-Evaluation 12/31/22    Authorization Type Medicare    Progress Note Due on Visit 10    SLP Start Time 1016    SLP Stop Time  1100    SLP Time Calculation (min) 44 min    Activity Tolerance Patient tolerated treatment well              Past Medical History:  Diagnosis Date   Arthritis    hands    Cancer (HCC)    prostate cancer    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   Hyperlipidemia    Hypertension    Prostate cancer (HCC)    Recurrent upper respiratory infection (URI)    pt had a cold recent- week ago - better now    Tremor    Past Surgical History:  Procedure Laterality Date   CATARACT EXTRACTION Bilateral    LUMBAR LAMINECTOMY/DECOMPRESSION MICRODISCECTOMY Left 04/01/2018   Procedure: Left Lumbar four-lumbar five Microdiscectomy;  Surgeon: Maeola Harman, MD;  Location: [facility] OR;  Service: Neurosurgery;  Laterality: Left;   OTHER SURGICAL HISTORY  2005   right hand middle finger surgery    REPAIR OF CEREBROSPINAL FLUID LEAK N/A 05/18/2018   Procedure: Exploration of Lumbar four-five with repair of cerebrospinal fluid leak;  Surgeon: Maeola Harman, MD;  Location: [facility] OR;  Service: Neurosurgery;  Laterality: N/A;   ROBOT ASSISTED LAPAROSCOPIC RADICAL PROSTATECTOMY  01/13/2011   Procedure: ROBOTIC ASSISTED LAPAROSCOPIC RADICAL PROSTATECTOMY LEVEL 2;  Surgeon: Crecencio Mc, MD;  Location: [facility] ORS;  Service: Urology;  Laterality: Bilateral;  Robotic Assisted Laparoscopic Prostatetectomy with Bilateral Lymphadenectomy  Level 2   Patient Active Problem List   Diagnosis Date Noted   Chronic pain syndrome 11/21/2022   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   Abnormal MRI,  lumbar spine 11/21/2022   Pharmacologic therapy 11/21/2022   Disorder of skeletal system 11/21/2022   Problems influencing health status 11/21/2022   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   Neuralgia of both pudendal nerves 09/29/2022   Plantar callus 09/29/2022   Chronic constipation 05/20/2022   Urinary incontinence 05/20/2022   Insomnia 05/20/2022   Restless leg 05/20/2022   Pelvic and perineal pain 01/31/2022   Status post implantation of artificial urinary sphincter 11/11/2021   Acquired cyst of kidney 07/03/2021   Male urinary stress incontinence 07/03/2021   Erectile dysfunction 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 07/03/2021   Hypertension 02/26/2021   Erythema of skin 07/24/2020   Fracture of rib 07/24/2020   Rib pain 07/24/2020   Postoperative cerebrospinal fluid leak 05/17/2018   Herniated lumbar disc without myelopathy 04/01/2018   Lumbar radiculopathy 03/03/2018   Displacement of lumbar intervertebral disc without myelopathy 03/03/2018   Degenerative lumbar spinal stenosis 05/01/2017   Hip pain 05/01/2017   Essential tremor 01/24/2016   Prostate CA (HCC) 03/10/2013    Onset date: 10/23/2022  REFERRING DIAG: R49.8 (ICD-10-CM) - Hypophonia J38.3 (ICD-10-CM) - Spasmodic dysphonia  THERAPY DIAG: Other voice and resonance disorders  Rationale for Evaluation and Treatment: Rehabilitation  SUBJECTIVE:   SUBJECTIVE STATEMENT: Reports considering ENT recommendations to address vocal fold atrophy/tremor, although he is nervous due to past procedures resulting in complications.  Pt accompanied  by: self   PERTINENT HISTORY: Segundo Nevel is referred by Dr. Arbutus Leas who is seeing him for essential tremor. She notes low voice and spasmodic dysphonia. They held off on ENT referral as his last appointment with her. [name] and his wife note decline in voice, volume and intelligibility over the past 5 months. [name] is referred for neuropsych testing due to memory and slow processing.   PAIN:  Are you  having pain? Yes: NPRS scale: 1-2/10 Pain location: throat Pain description: sore  FALLS: Has patient fallen in last 6 months? No,  LIVING ENVIRONMENT: Lives with: lives with their spouse Lives in: House/apartment  PLOF:Level of assistance: Independent with ADLs, Needed assistance with IADLS Employment: Retired  PATIENT GOALS: "To be heard"  OBJECTIVE:    COGNITION: Overall cognitive status: Impaired Areas of impairment:  Memory: Impaired: Short term Functional deficits:   SOCIAL HISTORY: Occupation: retired Counsellor intake: optimal Caffeine/alcohol intake: minimal Daily voice use: minimal  PERCEPTUAL VOICE ASSESSMENT: Voice quality: hoarse, breathy, strained, low vocal intensity, vocal fatigue, and aphonic Vocal abuse:  n/a Resonance: normal Respiratory function: thoracic breathing  OBJECTIVE VOICE ASSESSMENT: Maximum phonation time for sustained "ah": 6.42 Conversational loudness average: 62 dB Conversational loudness range: 56-67 dB  PATIENT REPORTED OUTCOME MEASURES (PROM): Communication Effectiveness Survey: 15/32. [name] rates a 2, or minimally effective communication with family at home, with strangers, over the phone, in a noisy environment, and in the car. He rates a 1 or not at all effective conversing across a room.  TODAY'S TREATMENT:                                                                                                                                          11/24/22: SLP further educated pt on recent ENT findings by answering remaining questions about ST prognosis and purpose of vocal fold augmentation. Endorsed completion of HEP 1-2x/day. Demonstrated exuberant voice exercises with rare min A to increase effort/volume slightly. Exhibited difficulty with pitch glides as pt expressed confusion about how to alter pitch without changing volume. SLP explained and modeled concept of pitch vs loudness and used video feedback to increase understanding. Pt  demonstrated improved understanding by repeating a pitch modeled by SLP with maintenance of targeted volume. Produced exuberant voice in sentences at various pitches and while answering structured questions given rare min A.   11/12/22: Reported mild reduction in throat soreness after daily completion of HEP (PhoRTE exercises). Demo'd exuberant voice exercises with occasional mod A to reduce vocal strain/decrease volume (initially >93 dB) during sustained phonation, maximize pitch range, and maintain effort levels. Noted mild vocal tremor during sustained phonation and difficulty gliding for pitch range. Performance mildly improved given SLP model and modifications. Provided throat clear alternatives given intermittent throat clearing exhibited. Scheduled to see ENT next week and will modify POC based on ENT observations and recommendations.   11/05/22 (eval day): Trials of exuberant  voice therapy achieved improved volume and voice quality. [name] achieved and maintained 83dB average on sustained Ah! With clear phonation. He achieved WNL volume and clear voice on pitch glides as well as phonatory resistance sentences. Mild vocal tremor noted however this did not distract from his speech. Trained in HEP for hypophonia. He sees PCP later today. I instructed them to get a referral to Dr. Irene Pap, laryngologist. He does endorse pill dysphagia where pill gets stuck and he "brings it back up" several times a week. Consider MBSS   PATIENT EDUCATION: Education details: HEP for voice, vocal hygiene Person educated: Patient and Spouse Education method: Explanation, Demonstration, Verbal cues, and Handouts Education comprehension: verbalized understanding, returned demonstration, verbal cues required, and needs further education  HOME EXERCISE PROGRAM: Phonation Resistance Training Exercises (PhoRTE), exuberant voice - will modify pending ENT consult  GOALS: Goals reviewed with patient? Yes  SHORT TERM GOALS:  Target date: 12/03/22  Pt will complete HEP for voice with occasional min A over 1 week Baseline: Goal status: IN PROGRESS  2.  Pt will average 88dB on sustained vowel for 10 seconds with clear phonation Baseline:  Goal status: IN PROGRESS  3.  Pt will average 70dB 18/20 sentences with clear phonation and occasional min A Baseline:  Goal status: IN PROGRESS  4.  Pt will average 70dB with clear phonation over 8 minute conversation with occasional min A  Baseline:  Goal status: IN PROGRESS  5.  Pt will follow swallow precautions and diet modifications pending MBSS with occasional min A Baseline:  Goal status: IN PROGRESS   LONG TERM GOALS: Target date: 12/31/22  Pt will complete HEP for voice with mod I over 1 week Baseline:  Goal status: IN PROGRESS  2.  Pt will average 70 dB with clear phonation over 15 minute conversation with rare min A Baseline:  Goal status: IN PROGRESS  3.  Pt will be intelligible over 10 minute conversation in noisy environment with rare min A Baseline:  Goal status: IN PROGRESS  4.  Pt will improve score on Communicative Effectiveness Survey by 3 points Baseline: 15 Goal status: IN PROGRESS  5.  If MBSS indicated, pt will follow diet modifications and swallow precautions with rare min A Baseline:  Goal status: IN PROGRESS   ASSESSMENT:  CLINICAL IMPRESSION: Patient is a 77 y.o. male who was seen today for dysphonia and vocal tremor. [name] presents with moderate hypophonia, hoarse, raspy voice, and intermittent aphonia. Mild vocal tremor is present. He is accompanied by his spouse, Diane who also endorses low volume and reduced intelligibility. [name] reports frequent requests to repeat himself.  He has limited phone use due to his volume/voice. [name] has had limited activity in the past year due to sacral pain, he is primarily reclined at home. They also endorse occasional choking/coughing during meals and [name] reports his pills "sometimes get stuck  and I have to spit it  back up." This occurs a couple of times a week. [name] did get improved volume and improve vocal quality with exuberant voice and phonation resistance exercises, indicating a good candidate for ST. 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 Will consider MBSS and cognitive compensations (neuropsych testing pending). I recommend skilled ST to maximize intelligibility for safety, independence and to reduce caregiver burden.  OBJECTIVE IMPAIRMENTS: include memory, voice disorder, and dysphagia. These impairments are limiting patient from managing appointments, household responsibilities, effectively communicating at home and in community, and safety when swallowing. Factors affecting  potential to achieve goals and functional outcome are medical prognosis.. Patient will benefit from skilled SLP services to address above impairments and improve overall function.  REHAB POTENTIAL: Good  PLAN:  SLP FREQUENCY: 2x/week  SLP DURATION: 8 weeks  PLANNED INTERVENTIONS: Aspiration precaution training, Pharyngeal strengthening exercises, Diet toleration management , Environmental controls, Trials of upgraded texture/liquids, Cueing hierachy, Cognitive reorganization, Internal/external aids, Oral motor exercises, Functional tasks, SLP instruction and feedback, Compensatory strategies, and Patient/family education, MBSS    Duriel Deery, Student-SLP 11/24/2022, 11:48 AM

## 2022-11-24 NOTE — Patient Instructions (Addendum)
Homework: Loud Ah! For 10 seconds 10x 2. 10 pitch glides with good volume 3. 10 sentences in a high pitch like you are calling to a neighbor over a fence 4. 10 sentences in a low authoritative pitch like you are the boss 5. 10 sentences in your normal but energized voice   Pitch Glide Tips:  You are changing your pitch (low, mid, high); not changing your volume (keep it consistent) Watch this video if you need a recap Youtube: Vocal folds- Pitch Physics Demo  Voice Hierarchy:  Relax (face, neck, shoulders) Breathe (deep breath in) Project your voice

## 2022-11-26 ENCOUNTER — Ambulatory Visit: Payer: Medicare Other | Attending: Neurology | Admitting: Speech Pathology

## 2022-11-26 ENCOUNTER — Encounter: Payer: Self-pay | Admitting: Pain Medicine

## 2022-11-26 ENCOUNTER — Ambulatory Visit: Payer: Medicare Other | Attending: Pain Medicine | Admitting: Pain Medicine

## 2022-11-26 ENCOUNTER — Encounter: Payer: Self-pay | Admitting: Speech Pathology

## 2022-11-26 VITALS — BP 141/59 | HR 54 | Temp 97.2°F | Ht 71.0 in | Wt 171.0 lb

## 2022-11-26 DIAGNOSIS — R1310 Dysphagia, unspecified: Secondary | ICD-10-CM | POA: Diagnosis not present

## 2022-11-26 DIAGNOSIS — T839XXS Unspecified complication of genitourinary prosthetic device, implant and graft, sequela: Secondary | ICD-10-CM | POA: Insufficient documentation

## 2022-11-26 DIAGNOSIS — G894 Chronic pain syndrome: Secondary | ICD-10-CM | POA: Diagnosis not present

## 2022-11-26 DIAGNOSIS — R102 Pelvic and perineal pain: Secondary | ICD-10-CM | POA: Diagnosis not present

## 2022-11-26 DIAGNOSIS — Z9689 Presence of other specified functional implants: Secondary | ICD-10-CM | POA: Diagnosis not present

## 2022-11-26 DIAGNOSIS — Z9079 Acquired absence of other genital organ(s): Secondary | ICD-10-CM | POA: Diagnosis not present

## 2022-11-26 DIAGNOSIS — G8929 Other chronic pain: Secondary | ICD-10-CM | POA: Insufficient documentation

## 2022-11-26 DIAGNOSIS — Z79899 Other long term (current) drug therapy: Secondary | ICD-10-CM | POA: Diagnosis not present

## 2022-11-26 DIAGNOSIS — M899 Disorder of bone, unspecified: Secondary | ICD-10-CM | POA: Diagnosis not present

## 2022-11-26 DIAGNOSIS — Z789 Other specified health status: Secondary | ICD-10-CM | POA: Diagnosis not present

## 2022-11-26 DIAGNOSIS — Z8546 Personal history of malignant neoplasm of prostate: Secondary | ICD-10-CM | POA: Diagnosis not present

## 2022-11-26 DIAGNOSIS — R498 Other voice and resonance disorders: Secondary | ICD-10-CM | POA: Insufficient documentation

## 2022-11-26 DIAGNOSIS — G8928 Other chronic postprocedural pain: Secondary | ICD-10-CM | POA: Insufficient documentation

## 2022-11-26 NOTE — Progress Notes (Signed)
Safety precautions to be maintained throughout the outpatient stay will include: orient to surroundings, keep bed in low position, maintain call bell within reach at all times, provide assistance with transfer out of bed and ambulation.  

## 2022-11-26 NOTE — Therapy (Signed)
OUTPATIENT SPEECH LANGUAGE PATHOLOGY VOICE TREATMENT   Patient Name: [name] MRN: [id number] DOB:Jan 31, 1946, 77 y.o., male Today's Date: 11/26/2022  PCP: Sandre Kitty MD,  REFERRING PROVIDER: Vladimir Faster, DO  END OF SESSION:  End of Session - 11/26/22 1406     Visit Number 4    Number of Visits 17    Date for SLP Re-Evaluation 12/31/22    Authorization Type Medicare    Progress Note Due on Visit 10    SLP Start Time 1400    SLP Stop Time  1445    SLP Time Calculation (min) 45 min    Activity Tolerance Patient tolerated treatment well              Past Medical History:  Diagnosis Date   Arthritis    hands    Cancer (HCC)    prostate cancer    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   Hyperlipidemia    Hypertension    Prostate cancer (HCC)    Recurrent upper respiratory infection (URI)    pt had a cold recent- week ago - better now    Tremor    Past Surgical History:  Procedure Laterality Date   CATARACT EXTRACTION Bilateral    LUMBAR LAMINECTOMY/DECOMPRESSION MICRODISCECTOMY Left 04/01/2018   Procedure: Left Lumbar four-lumbar five Microdiscectomy;  Surgeon: Maeola Harman, MD;  Location: [facility] OR;  Service: Neurosurgery;  Laterality: Left;   OTHER SURGICAL HISTORY  2005   right hand middle finger surgery    REPAIR OF CEREBROSPINAL FLUID LEAK N/A 05/18/2018   Procedure: Exploration of Lumbar four-five with repair of cerebrospinal fluid leak;  Surgeon: Maeola Harman, MD;  Location: [facility] OR;  Service: Neurosurgery;  Laterality: N/A;   ROBOT ASSISTED LAPAROSCOPIC RADICAL PROSTATECTOMY  01/13/2011   Procedure: ROBOTIC ASSISTED LAPAROSCOPIC RADICAL PROSTATECTOMY LEVEL 2;  Surgeon: Crecencio Mc, MD;  Location: [facility] ORS;  Service: Urology;  Laterality: Bilateral;  Robotic Assisted Laparoscopic Prostatetectomy with Bilateral Lymphadenectomy  Level 2   Patient Active Problem List   Diagnosis Date Noted   Personal history of prostate cancer 11/26/2022   Chronic perineal pain in male (1ry  area of Pain) 11/26/2022   Chronic postoperative pain 11/26/2022   History of prostatectomy 11/26/2022   History of implantation of artificial sphincter 11/26/2022   Complication of urinary sphincter implant, sequela 11/26/2022   Chronic pain syndrome 11/21/2022   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   Abnormal MRI, lumbar spine 11/21/2022   Pharmacologic therapy 11/21/2022   Disorder of skeletal system 11/21/2022   Problems influencing health status 11/21/2022   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   Neuralgia of both pudendal nerves 09/29/2022   Plantar callus 09/29/2022   Chronic constipation 05/20/2022   Urinary incontinence 05/20/2022   Insomnia 05/20/2022   Restless leg 05/20/2022   Pelvic and perineal pain 01/31/2022   Status post implantation of artificial urinary sphincter 11/11/2021   Acquired cyst of kidney 07/03/2021   Male urinary stress incontinence 07/03/2021   Erectile dysfunction 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 07/03/2021   Hypertension 02/26/2021   Erythema of skin 07/24/2020   Fracture of rib 07/24/2020   Rib pain 07/24/2020   Postoperative cerebrospinal fluid leak 05/17/2018   Herniated lumbar disc without myelopathy 04/01/2018   Lumbar radiculopathy 03/03/2018   Displacement of lumbar intervertebral disc without myelopathy 03/03/2018   Degenerative lumbar spinal stenosis 05/01/2017   Hip pain 05/01/2017   Essential tremor 01/24/2016   Prostate CA (HCC) 03/10/2013    Onset date: 10/23/2022  REFERRING DIAG: R49.8 (ICD-10-CM) -  Hypophonia J38.3 (ICD-10-CM) - Spasmodic dysphonia  THERAPY DIAG: Other voice and resonance disorders  Rationale for Evaluation and Treatment: Rehabilitation  SUBJECTIVE:   SUBJECTIVE STATEMENT: Reports considering ENT recommendations to address vocal fold atrophy/tremor, although he is nervous due to past procedures resulting in complications.  Pt accompanied by: self   PERTINENT HISTORY: [name] is referred by Dr. Arbutus Leas who is seeing him  for essential tremor. She notes low voice and spasmodic dysphonia. They held off on ENT referral as his last appointment with her. [name] and his wife note decline in voice, volume and intelligibility over the past 5 months. [name] is referred for neuropsych testing due to memory and slow processing.   PAIN:  Are you having pain? Yes: NPRS scale: 1-2/10 Pain location: throat Pain description: sore  FALLS: Has patient fallen in last 6 months? No,  LIVING ENVIRONMENT: Lives with: lives with their spouse Lives in: House/apartment  PLOF:Level of assistance: Independent with ADLs, Needed assistance with IADLS Employment: Retired  PATIENT GOALS: "To be heard"  OBJECTIVE:    COGNITION: Overall cognitive status: Impaired Areas of impairment:  Memory: Impaired: Short term Functional deficits:   SOCIAL HISTORY: Occupation: retired Counsellor intake: optimal Caffeine/alcohol intake: minimal Daily voice use: minimal  PERCEPTUAL VOICE ASSESSMENT: Voice quality: hoarse, breathy, strained, low vocal intensity, vocal fatigue, and aphonic Vocal abuse:  n/a Resonance: normal Respiratory function: thoracic breathing  OBJECTIVE VOICE ASSESSMENT: Maximum phonation time for sustained "ah": 6.42 Conversational loudness average: 62 dB Conversational loudness range: 56-67 dB  PATIENT REPORTED OUTCOME MEASURES (PROM): Communication Effectiveness Survey: 15/32. [name] rates a 2, or minimally effective communication with family at home, with strangers, over the phone, in a noisy environment, and in the car. He rates a 1 or not at all effective conversing across a room.  TODAY'S TREATMENT:                                                                                                                                          11/26/22: Pt enters 67dB - low volume.  Loud Ah to recalibrate volume and for HEP - he required verbal cues to achieve 88dB without shoulder and neck tension. He demonstrated PhoRTE HEP  with occasional min A for pitch glide down only - other exercises with rare min A. He maintained volume without strain with rare min verbal cues. I increased PhoRTE sentences from 3-5 words to 5-7 words and reading for intent/volume sentences from 5-7 to 10+ words. In structured task generating 5-7 words in category using sentences, [name] averaged 72dB. In conversation re: his cousins/family reunion he averaged 70-72dB with occasional min A. Has family reunion this weekend - instructed him to practice speaking with intent as this as the event will be noisy  11/24/22: SLP further educated pt on recent ENT findings by answering remaining questions about ST prognosis and purpose of vocal fold augmentation. Endorsed completion  of HEP 1-2x/day. Demonstrated exuberant voice exercises with rare min A to increase effort/volume slightly. Exhibited difficulty with pitch glides as pt expressed confusion about how to alter pitch without changing volume. SLP explained and modeled concept of pitch vs loudness and used video feedback to increase understanding. Pt demonstrated improved understanding by repeating a pitch modeled by SLP with maintenance of targeted volume. Produced exuberant voice in sentences at various pitches and while answering structured questions given rare min A.   11/12/22: Reported mild reduction in throat soreness after daily completion of HEP (PhoRTE exercises). Demo'd exuberant voice exercises with occasional mod A to reduce vocal strain/decrease volume (initially >93 dB) during sustained phonation, maximize pitch range, and maintain effort levels. Noted mild vocal tremor during sustained phonation and difficulty gliding for pitch range. Performance mildly improved given SLP model and modifications. Provided throat clear alternatives given intermittent throat clearing exhibited. Scheduled to see ENT next week and will modify POC based on ENT observations and recommendations.   11/05/22 (eval day):  Trials of exuberant voice therapy achieved improved volume and voice quality. [name] achieved and maintained 83dB average on sustained Ah! With clear phonation. He achieved WNL volume and clear voice on pitch glides as well as phonatory resistance sentences. Mild vocal tremor noted however this did not distract from his speech. Trained in HEP for hypophonia. He sees PCP later today. I instructed them to get a referral to Dr. Irene Pap, laryngologist. He does endorse pill dysphagia where pill gets stuck and he "brings it back up" several times a week. Consider MBSS   PATIENT EDUCATION: Education details: HEP for voice, vocal hygiene Person educated: Patient and Spouse Education method: Explanation, Demonstration, Verbal cues, and Handouts Education comprehension: verbalized understanding, returned demonstration, verbal cues required, and needs further education  HOME EXERCISE PROGRAM: Phonation Resistance Training Exercises (PhoRTE), exuberant voice - will modify pending ENT consult  GOALS: Goals reviewed with patient? Yes  SHORT TERM GOALS: Target date: 12/03/22  Pt will complete HEP for voice with occasional min A over 1 week Baseline: Goal status: IN PROGRESS  2.  Pt will average 88dB on sustained vowel for 10 seconds with clear phonation Baseline:  Goal status: IN PROGRESS  3.  Pt will average 70dB 18/20 sentences with clear phonation and occasional min A Baseline:  Goal status: IN PROGRESS  4.  Pt will average 70dB with clear phonation over 8 minute conversation with occasional min A  Baseline:  Goal status: IN PROGRESS  5.  Pt will follow swallow precautions and diet modifications pending MBSS with occasional min A Baseline:  Goal status: IN PROGRESS   LONG TERM GOALS: Target date: 12/31/22  Pt will complete HEP for voice with mod I over 1 week Baseline:  Goal status: IN PROGRESS  2.  Pt will average 70 dB with clear phonation over 15 minute conversation with rare min  A Baseline:  Goal status: IN PROGRESS  3.  Pt will be intelligible over 10 minute conversation in noisy environment with rare min A Baseline:  Goal status: IN PROGRESS  4.  Pt will improve score on Communicative Effectiveness Survey by 3 points Baseline: 15 Goal status: IN PROGRESS  5.  If MBSS indicated, pt will follow diet modifications and swallow precautions with rare min A Baseline:  Goal status: IN PROGRESS   ASSESSMENT:  CLINICAL IMPRESSION: Patient is a 77 y.o. male who was seen today for dysphonia and vocal tremor. [name] presents with moderate hypophonia, hoarse, raspy voice, and intermittent aphonia.  Mild vocal tremor is present. He is accompanied by his spouse, Diane who also endorses low volume and reduced intelligibility. [name] reports frequent requests to repeat himself.  He has limited phone use due to his volume/voice. [name] has had limited activity in the past year due to sacral pain, he is primarily reclined at home. They also endorse occasional choking/coughing during meals and [name] reports his pills "sometimes get stuck and I have to spit it  back up." This occurs a couple of times a week. [name] did get improved volume and improve vocal quality with exuberant voice and phonation resistance exercises, indicating a good candidate for ST. 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 Will consider MBSS and cognitive compensations (neuropsych testing pending). I recommend skilled ST to maximize intelligibility for safety, independence and to reduce caregiver burden.  OBJECTIVE IMPAIRMENTS: include memory, voice disorder, and dysphagia. These impairments are limiting patient from managing appointments, household responsibilities, effectively communicating at home and in community, and safety when swallowing. Factors affecting potential to achieve goals and functional outcome are medical prognosis.. Patient will benefit from skilled SLP services to  address above impairments and improve overall function.  REHAB POTENTIAL: Good  PLAN:  SLP FREQUENCY: 2x/week  SLP DURATION: 8 weeks  PLANNED INTERVENTIONS: Aspiration precaution training, Pharyngeal strengthening exercises, Diet toleration management , Environmental controls, Trials of upgraded texture/liquids, Cueing hierachy, Cognitive reorganization, Internal/external aids, Oral motor exercises, Functional tasks, SLP instruction and feedback, Compensatory strategies, and Patient/family education, MBSS    Tinea Nobile, Radene Journey, CCC-SLP 11/26/2022, 3:12 PM

## 2022-11-26 NOTE — Patient Instructions (Addendum)
  Remember to get as low pitch as you can on glide down  Now, do high pitch 10 and low pitch ten sentences on the 5-7 word sentences twice a day  Read to 10+ sentences with intent and volume - 10 sentences twice a day  You can advance yourself to paragraphs aloud as well with what ever you have to read at home  I cancelled 10/16 appointment - I am running away to [facility] for the lean in, people frowning their brow to focus on your speech or not responding correctly to what you say  Do your exercises on the way to the reunion

## 2022-12-01 ENCOUNTER — Ambulatory Visit: Payer: Medicare Other | Admitting: Speech Pathology

## 2022-12-01 ENCOUNTER — Encounter: Payer: Self-pay | Admitting: Family Medicine

## 2022-12-01 ENCOUNTER — Ambulatory Visit (INDEPENDENT_AMBULATORY_CARE_PROVIDER_SITE_OTHER): Payer: Medicare Other | Admitting: Family Medicine

## 2022-12-01 ENCOUNTER — Encounter: Payer: Self-pay | Admitting: Speech Pathology

## 2022-12-01 VITALS — BP 112/70 | HR 60 | Ht 71.0 in | Wt 173.0 lb

## 2022-12-01 DIAGNOSIS — R1310 Dysphagia, unspecified: Secondary | ICD-10-CM | POA: Diagnosis not present

## 2022-12-01 DIAGNOSIS — J383 Other diseases of vocal cords: Secondary | ICD-10-CM | POA: Diagnosis not present

## 2022-12-01 DIAGNOSIS — H00012 Hordeolum externum right lower eyelid: Secondary | ICD-10-CM | POA: Insufficient documentation

## 2022-12-01 DIAGNOSIS — F419 Anxiety disorder, unspecified: Secondary | ICD-10-CM | POA: Diagnosis not present

## 2022-12-01 DIAGNOSIS — I10 Essential (primary) hypertension: Secondary | ICD-10-CM

## 2022-12-01 DIAGNOSIS — R498 Other voice and resonance disorders: Secondary | ICD-10-CM | POA: Diagnosis not present

## 2022-12-01 DIAGNOSIS — G588 Other specified mononeuropathies: Secondary | ICD-10-CM | POA: Diagnosis not present

## 2022-12-01 MED ORDER — LORAZEPAM 0.5 MG PO TABS: 0.5000 mg | ORAL_TABLET | ORAL | 0 refills | Status: DC

## 2022-12-01 NOTE — Assessment & Plan Note (Signed)
Patient complains of right lower eyelid swelling pain that comes and goes.  On exam today currently normal.  Recommended continued conservative management with warm compresses.  Discussed signs and symptoms of infection and reasons to seek immediate medical attention.  Advised patient we can refer to an ophthalmologist if symptoms worsen or continue to recur frequently despite conservative treatment.

## 2022-12-01 NOTE — Assessment & Plan Note (Signed)
Patient has seen the interventional pain specialist.  They did not have any new treatments to offer him.  Discussed with the patient that I have run out of different options for alternative treatments.  Recommended he talk to the orthopedic specialist who suggested shockwave therapy.  He has also had a physical therapist suggest dry needling.  We discussed the pros and cons of both of those.  Patient likely to try shockwave therapy prior to trying dry needling.  Advised patient I can send in a referral if necessary, no appointment necessary he can just call.  We discussed capsaicin including over-the-counter and prescription patches.  Discussed how Qutenza would need to be applied by someone who is trained in using that.  Was hoping that one of the specialist I referred him to may have had experience using Qutenza..  If patient opts to try over-the-counter capsaicin cream recommended using only a pea-sized amount in the small area to test given the proximity to his scrotum and rectum.  He is currently taking Lyrica 3 times daily and using lidocaine cream.

## 2022-12-01 NOTE — Progress Notes (Signed)
Established Patient Office Visit  Subjective   Patient ID: [name], male    DOB: 02/24/1946  Age: 77 y.o. MRN: [id number]  Chief Complaint  Patient presents with   Medical Management of Chronic Issues    HPI  Pudendal pain-patient saw the interventional pain specialist.  Did not have any new treatment to offer the patient.  We discussed following up with the orthopedist who recommended shockwave therapy.  We also discussed over-the-counter treatments such as capsaicin cream and how to safely use it.  Discussed dry needling which the physical therapist had offered him in the past.  Patient currently taking pregabalin 3 times daily.  Has a follow-up appointment with Dr. Lorrine Kin tomorrow to discuss his medications.  Also has telehealth appointment with the urologist tomorrow.  Hypertension-patient is taking 2.5 mg amlodipine and 100 mg of losartan.  He has episodes of low blood pressure readings in the 90s over 50s.  Also has occasional lightheadedness with these.  I advised him to check his blood pressure at least once a day at home and record the time and date with any symptoms and talk to his cardiologist who prescribes these medications regarding any dosing adjustments that would be appropriate.  Parkinson's-patient is taking 250 mg/mL in the morning, 50 mg in the evening.  States that he feels initially "more shaky" for a few hours after taking the primidone.  Anxiety-patient taking Ativan as needed, usually takes it once every 1 to 2 weeks.  Typically takes it when he expects to be in a stressful situation.  He or if he is extra stressed about any particular reason.  Does not make him drowsy.  Takes it during the day.  Vocal cord dysfunction-patient has seen the ear nose and throat specialist.  They are going to have a swallowing study evaluation as well as possible intervention including Botox or hyaluronic acid injection.  Patient is contemplating these.  He is focusing on his speech  and consciously increasing his volume as he was taught in speech therapy.  Stye-patient feels like the right lower eyelid occasionally becomes painful and swollen.  He typically will put a hot compress on couple times a day for a few days and it will improve.  Will then continue using it for a few days longer.  His issue is that it continues to recur.  He also has tried over-the-counter ointments but this made the pain worse.  Has never noticed any discharge or evidence of infection.  We discussed the treatment being conservative with warm compresses versus a possible procedure performed by a ophthalmologist or other specialist.  He would like to continue conservative management at this time.   The ASCVD Risk score (Arnett DK, et al., 2019) failed to calculate for the following reasons:   Cannot find a previous HDL lab   Cannot find a previous total cholesterol lab  Health Maintenance Due  Topic Date Due   Hepatitis C Screening  Never done   Medicare Annual Wellness (AWV)  01/31/2022   COVID-19 Vaccine (4 - 2023-24 season) 10/26/2022      Objective:     BP 112/70   Pulse 60   Ht 5\' 11"  (1.803 m)   Wt 173 lb (78.5 kg)   SpO2 99%   BMI 24.13 kg/m    Physical Exam General: Alert, oriented HEENT: No evidence of stye on the right lower lid.  Normal conjunctiva.  No signs of infection. Pulmonary: No respiratory distress Neuro: Speech with decreased  volume and voice tremor.  When he speaks in a louder volume these symptoms improve.    No results found for any visits on 12/01/22.      Assessment & Plan:   Spasmodic dysphonia Assessment & Plan: Patient has seen Dr. Irene Pap.  Scheduled for a MBS on and recommended Botox versus hyaluronic acid injection.  Patient weighing his decision regarding treatment options  Continues to work with speech therapy regarding this.   Pudendal neuralgia Assessment & Plan: Patient has seen the interventional pain specialist.  They did not  have any new treatments to offer him.  Discussed with the patient that I have run out of different options for alternative treatments.  Recommended he talk to the orthopedic specialist who suggested shockwave therapy.  He has also had a physical therapist suggest dry needling.  We discussed the pros and cons of both of those.  Patient likely to try shockwave therapy prior to trying dry needling.  Advised patient I can send in a referral if necessary, no appointment necessary he can just call.  We discussed capsaicin including over-the-counter and prescription patches.  Discussed how Qutenza would need to be applied by someone who is trained in using that.  Was hoping that one of the specialist I referred him to may have had experience using Qutenza..  If patient opts to try over-the-counter capsaicin cream recommended using only a pea-sized amount in the small area to test given the proximity to his scrotum and rectum.  He is currently taking Lyrica 3 times daily and using lidocaine cream.   Primary hypertension Assessment & Plan: Patient's blood pressure appears to be labile with elevated readings in the office and lower readings at home, some in the 90s systolic including some symptomatic of lightheadedness.  Advised him to write down his blood pressure readings daily with date and time and symptoms and talk to his cardiologist that prescribes his blood pressure medications regarding possibly decreasing losartan or stopping amlodipine.   Hordeolum externum of right lower eyelid Assessment & Plan: Patient complains of right lower eyelid swelling pain that comes and goes.  On exam today currently normal.  Recommended continued conservative management with warm compresses.  Discussed signs and symptoms of infection and reasons to seek immediate medical attention.  Advised patient we can refer to an ophthalmologist if symptoms worsen or continue to recur frequently despite conservative  treatment.   Anxiety Assessment & Plan: Patient takes Ativan 0.5 mg as needed, generally once every 1 or 2 weeks.  Tolerates it well.  Sending in refill today for 15 tablets   Other orders -     LORazepam; Take 1 tablet (0.5 mg total) by mouth as directed. For Anxiety  Dispense: 15 tablet; Refill: 0     Return in about 2 months (around 01/31/2023) for htn, .    Sandre Kitty, MD

## 2022-12-01 NOTE — Assessment & Plan Note (Signed)
Patient takes Ativan 0.5 mg as needed, generally once every 1 or 2 weeks.  Tolerates it well.  Sending in refill today for 15 tablets

## 2022-12-01 NOTE — Therapy (Signed)
OUTPATIENT SPEECH LANGUAGE PATHOLOGY VOICE TREATMENT   Patient Name: [name] MRN: [id number] DOB:1945-09-04, 77 y.o., male Today's Date: 12/01/2022  PCP: Sandre Kitty MD,  REFERRING PROVIDER: Vladimir Faster, DO  END OF SESSION:  End of Session - 12/01/22 1322     Visit Number 5    Number of Visits 17    Date for SLP Re-Evaluation 12/31/22    Authorization Type Medicare    SLP Start Time 1315    SLP Stop Time  1400    SLP Time Calculation (min) 45 min    Activity Tolerance Patient tolerated treatment well              Past Medical History:  Diagnosis Date   Arthritis    hands    Cancer (HCC)    prostate cancer    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   Hyperlipidemia    Hypertension    Prostate cancer (HCC)    Recurrent upper respiratory infection (URI)    pt had a cold recent- week ago - better now    Tremor    Past Surgical History:  Procedure Laterality Date   CATARACT EXTRACTION Bilateral    LUMBAR LAMINECTOMY/DECOMPRESSION MICRODISCECTOMY Left 04/01/2018   Procedure: Left Lumbar four-lumbar five Microdiscectomy;  Surgeon: Maeola Harman, MD;  Location: [facility] OR;  Service: Neurosurgery;  Laterality: Left;   OTHER SURGICAL HISTORY  2005   right hand middle finger surgery    REPAIR OF CEREBROSPINAL FLUID LEAK N/A 05/18/2018   Procedure: Exploration of Lumbar four-five with repair of cerebrospinal fluid leak;  Surgeon: Maeola Harman, MD;  Location: [facility] OR;  Service: Neurosurgery;  Laterality: N/A;   ROBOT ASSISTED LAPAROSCOPIC RADICAL PROSTATECTOMY  01/13/2011   Procedure: ROBOTIC ASSISTED LAPAROSCOPIC RADICAL PROSTATECTOMY LEVEL 2;  Surgeon: Crecencio Mc, MD;  Location: [facility] ORS;  Service: Urology;  Laterality: Bilateral;  Robotic Assisted Laparoscopic Prostatetectomy with Bilateral Lymphadenectomy  Level 2   Patient Active Problem List   Diagnosis Date Noted   Hordeolum externum of right lower eyelid 12/01/2022   Anxiety 12/01/2022   Personal history of prostate cancer  11/26/2022   Chronic perineal pain in male (1ry area of Pain) 11/26/2022   Chronic postoperative pain 11/26/2022   History of prostatectomy 11/26/2022   History of implantation of artificial sphincter 11/26/2022   Complication of urinary sphincter implant, sequela 11/26/2022   Chronic pain syndrome 11/21/2022   Abnormal MRI, lumbar spine 11/21/2022   Pharmacologic therapy 11/21/2022   Disorder of skeletal system 11/21/2022   Problems influencing health status 11/21/2022   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   Plantar callus 09/29/2022   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   Chronic constipation 05/20/2022   Urinary incontinence 05/20/2022   Insomnia 05/20/2022   Restless leg 05/20/2022   Pelvic and perineal pain 01/31/2022   Status post implantation of artificial urinary sphincter 11/11/2021   Acquired cyst of kidney 07/03/2021   Male urinary stress incontinence 07/03/2021   Erectile dysfunction 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 07/03/2021   Hypertension 02/26/2021   Erythema of skin 07/24/2020   Fracture of rib 07/24/2020   Postoperative cerebrospinal fluid leak 05/17/2018   Herniated lumbar disc without myelopathy 04/01/2018   Lumbar radiculopathy 03/03/2018   Displacement of lumbar intervertebral disc without myelopathy 03/03/2018   Degenerative lumbar spinal stenosis 05/01/2017   Hip pain 05/01/2017   Essential tremor 01/24/2016   Prostate CA (HCC) 03/10/2013    Onset date: 10/23/2022  REFERRING DIAG: R49.8 (ICD-10-CM) - Hypophonia J38.3 (ICD-10-CM) - Spasmodic dysphonia  THERAPY DIAG:  Other voice and resonance disorders  Rationale for Evaluation and Treatment: Rehabilitation  SUBJECTIVE:   SUBJECTIVE STATEMENT: Reports considering ENT recommendations to address vocal fold atrophy/tremor, although he is nervous due to past procedures resulting in complications.  Pt accompanied by: self   PERTINENT HISTORY: [name] is referred by Dr. Arbutus Leas who is seeing him for essential tremor.  She notes low voice and spasmodic dysphonia. They held off on ENT referral as his last appointment with her. [name] and his wife note decline in voice, volume and intelligibility over the past 5 months. [name] is referred for neuropsych testing due to memory and slow processing.   PAIN:  Are you having pain? Yes: NPRS scale: 1-2/10 Pain location: throat Pain description: sore  FALLS: Has patient fallen in last 6 months? No,  LIVING ENVIRONMENT: Lives with: lives with their spouse Lives in: House/apartment  PLOF:Level of assistance: Independent with ADLs, Needed assistance with IADLS Employment: Retired  PATIENT GOALS: "To be heard"  OBJECTIVE:    COGNITION: Overall cognitive status: Impaired Areas of impairment:  Memory: Impaired: Short term Functional deficits:   SOCIAL HISTORY: Occupation: retired Counsellor intake: optimal Caffeine/alcohol intake: minimal Daily voice use: minimal  PERCEPTUAL VOICE ASSESSMENT: Voice quality: hoarse, breathy, strained, low vocal intensity, vocal fatigue, and aphonic Vocal abuse:  n/a Resonance: normal Respiratory function: thoracic breathing  OBJECTIVE VOICE ASSESSMENT: Maximum phonation time for sustained "ah": 6.42 Conversational loudness average: 62 dB Conversational loudness range: 56-67 dB  PATIENT REPORTED OUTCOME MEASURES (PROM): Communication Effectiveness Survey: 15/32. [name] rates a 2, or minimally effective communication with family at home, with strangers, over the phone, in a noisy environment, and in the car. He rates a 1 or not at all effective conversing across a room.  TODAY'S TREATMENT:                                                                                                                                          12/01/22: [name] reports success communicating at his family reunion this weekend. He endorsed that he was mindful about speaking with intent and volume. He completed HEP with visual cues to hit 85-90dB - he  benefited from seeing the sound level meter to achieve target dB. Pitch glides up with rare min A, down with usual min -mod verbal cues. Oral reading 10+ word sentences with average of 74dB mod I. In structured task generating 3 sentence descriptions with average of 70dB 21/21 sentences. In simple conversation re: family reunion, [name] averaged 72dB with rare min A over 7 minutes. Vocal tremor reduced when he speaks with intetn  11/26/22: Pt enters 67dB - low volume.  Loud Ah to recalibrate volume and for HEP - he required verbal cues to achieve 88dB without shoulder and neck tension. He demonstrated PhoRTE HEP with occasional min A for pitch glide down only - other exercises with rare min A. He maintained volume  without strain with rare min verbal cues. I increased PhoRTE sentences from 3-5 words to 5-7 words and reading for intent/volume sentences from 5-7 to 10+ words. In structured task generating 5-7 words in category using sentences, [name] averaged 72dB. In conversation re: his cousins/family reunion he averaged 70-72dB with occasional min A. Has family reunion this weekend - instructed him to practice speaking with intent as this as the event will be noisy  11/24/22: SLP further educated pt on recent ENT findings by answering remaining questions about ST prognosis and purpose of vocal fold augmentation. Endorsed completion of HEP 1-2x/day. Demonstrated exuberant voice exercises with rare min A to increase effort/volume slightly. Exhibited difficulty with pitch glides as pt expressed confusion about how to alter pitch without changing volume. SLP explained and modeled concept of pitch vs loudness and used video feedback to increase understanding. Pt demonstrated improved understanding by repeating a pitch modeled by SLP with maintenance of targeted volume. Produced exuberant voice in sentences at various pitches and while answering structured questions given rare min A.   11/12/22: Reported mild reduction in  throat soreness after daily completion of HEP (PhoRTE exercises). Demo'd exuberant voice exercises with occasional mod A to reduce vocal strain/decrease volume (initially >93 dB) during sustained phonation, maximize pitch range, and maintain effort levels. Noted mild vocal tremor during sustained phonation and difficulty gliding for pitch range. Performance mildly improved given SLP model and modifications. Provided throat clear alternatives given intermittent throat clearing exhibited. Scheduled to see ENT next week and will modify POC based on ENT observations and recommendations.   11/05/22 (eval day): Trials of exuberant voice therapy achieved improved volume and voice quality. [name] achieved and maintained 83dB average on sustained Ah! With clear phonation. He achieved WNL volume and clear voice on pitch glides as well as phonatory resistance sentences. Mild vocal tremor noted however this did not distract from his speech. Trained in HEP for hypophonia. He sees PCP later today. I instructed them to get a referral to Dr. Irene Pap, laryngologist. He does endorse pill dysphagia where pill gets stuck and he "brings it back up" several times a week. Consider MBSS   PATIENT EDUCATION: Education details: HEP for voice, vocal hygiene Person educated: Patient and Spouse Education method: Explanation, Demonstration, Verbal cues, and Handouts Education comprehension: verbalized understanding, returned demonstration, verbal cues required, and needs further education  HOME EXERCISE PROGRAM: Phonation Resistance Training Exercises (PhoRTE), exuberant voice - will modify pending ENT consult  GOALS: Goals reviewed with patient? Yes  SHORT TERM GOALS: Target date: 12/03/22  Pt will complete HEP for voice with occasional min A over 1 week Baseline: Goal status: IN PROGRESS  2.  Pt will average 88dB on sustained vowel for 10 seconds with clear phonation Baseline:  Goal status: IN PROGRESS  3.  Pt will  average 70dB 18/20 sentences with clear phonation and occasional min A Baseline:  Goal status: IMET  4.  Pt will average 70dB with clear phonation over 8 minute conversation with occasional min A  Baseline:  Goal status: IN PROGRESS  5.  Pt will follow swallow precautions and diet modifications pending MBSS with occasional min A Baseline:  Goal status: IN PROGRESS   LONG TERM GOALS: Target date: 12/31/22  Pt will complete HEP for voice with mod I over 1 week Baseline:  Goal status: IN PROGRESS  2.  Pt will average 70 dB with clear phonation over 15 minute conversation with rare min A Baseline:  Goal status: IN PROGRESS  3.  Pt will be intelligible over 10 minute conversation in noisy environment with rare min A Baseline:  Goal status: IN PROGRESS  4.  Pt will improve score on Communicative Effectiveness Survey by 3 points Baseline: 15 Goal status: IN PROGRESS  5.  If MBSS indicated, pt will follow diet modifications and swallow precautions with rare min A Baseline:  Goal status: IN PROGRESS   ASSESSMENT:  CLINICAL IMPRESSION: Patient is a 77 y.o. male who was seen today for dysphonia and vocal tremor. [name] presents with moderate hypophonia, hoarse, raspy voice, and intermittent aphonia. Mild vocal tremor is present. He is accompanied by his spouse, [name] who also endorses low volume and reduced intelligibility. [name] reports frequent requests to repeat himself.  He has limited phone use due to his volume/voice. [name] has had limited activity in the past year due to sacral pain, he is primarily reclined at home. They also endorse occasional choking/coughing during meals and [name] reports his pills "sometimes get stuck and I have to spit it  back up." This occurs a couple of times a week. [name] did get improved volume and improve vocal quality with exuberant voice and phonation resistance exercises, indicating a good candidate for ST. ENT visit on 11/20/22 revealed laryngeal tremor and  significant vocal atrophy. Will consider MBSS and cognitive compensations (neuropsych testing pending). I recommend skilled ST to maximize intelligibility for safety, independence and to reduce caregiver burden.  OBJECTIVE IMPAIRMENTS: include memory, voice disorder, and dysphagia. These impairments are limiting patient from managing appointments, household responsibilities, effectively communicating at home and in community, and safety when swallowing. Factors affecting potential to achieve goals and functional outcome are medical prognosis.. Patient will benefit from skilled SLP services to address above impairments and improve overall function.  REHAB POTENTIAL: Good  PLAN:  SLP FREQUENCY: 2x/week  SLP DURATION: 8 weeks  PLANNED INTERVENTIONS: Aspiration precaution training, Pharyngeal strengthening exercises, Diet toleration management , Environmental controls, Trials of upgraded texture/liquids, Cueing hierachy, Cognitive reorganization, Internal/external aids, Oral motor exercises, Functional tasks, SLP instruction and feedback, Compensatory strategies, and Patient/family education, MBSS    Yvonna Brun, Radene Journey, CCC-SLP 12/01/2022, 2:00 PM

## 2022-12-01 NOTE — Patient Instructions (Signed)
It was nice to see you today,  We addressed the following topics today: -I have sent in a refill for the Ativan. - I would recommend that you talk to the orthopedist you spoke to regarding the dry needling and shockwave therapy.  If you need a referral for me for any of those I would be happy to send a referral. - For the stye on your right eye I would recommend continued hot compresses.  If you feel like this is not helping or you would like a specialist to look at I can send in a referral to an ophthalmologist. - I would recommend that you make a log of all the times you check your blood pressure, check it at least once a day.  Write down the date and time and any symptoms you are having.  Then use this information when talking to the cardiologist.  You should not be having low blood pressures that make you feel symptomatic or dizzy.  Patient  Have a great day,  Frederic Jericho, MD

## 2022-12-01 NOTE — Assessment & Plan Note (Signed)
Patient's blood pressure appears to be labile with elevated readings in the office and lower readings at home, some in the 90s systolic including some symptomatic of lightheadedness.  Advised him to write down his blood pressure readings daily with date and time and symptoms and talk to his cardiologist that prescribes his blood pressure medications regarding possibly decreasing losartan or stopping amlodipine.

## 2022-12-01 NOTE — Patient Instructions (Addendum)
   Try to get as high and low as you can in your glides - focus on this  [place] job speaking with intent outside   Keep a mental note of when you are asked to repeat yourself  Make sure you get to swallow the barium pill on the swallow test

## 2022-12-01 NOTE — Assessment & Plan Note (Signed)
Patient has seen Dr. Irene Pap.  Scheduled for a MBS on and recommended Botox versus hyaluronic acid injection.  Patient weighing his decision regarding treatment options  Continues to work with speech therapy regarding this.

## 2022-12-02 ENCOUNTER — Ambulatory Visit: Payer: Medicare Other | Admitting: Neurology

## 2022-12-03 ENCOUNTER — Encounter: Payer: Self-pay | Admitting: Speech Pathology

## 2022-12-03 ENCOUNTER — Ambulatory Visit: Payer: Medicare Other | Admitting: Speech Pathology

## 2022-12-03 DIAGNOSIS — N393 Stress incontinence (female) (male): Secondary | ICD-10-CM | POA: Diagnosis not present

## 2022-12-03 DIAGNOSIS — N281 Cyst of kidney, acquired: Secondary | ICD-10-CM | POA: Diagnosis not present

## 2022-12-03 DIAGNOSIS — R498 Other voice and resonance disorders: Secondary | ICD-10-CM | POA: Diagnosis not present

## 2022-12-03 DIAGNOSIS — C61 Malignant neoplasm of prostate: Secondary | ICD-10-CM | POA: Diagnosis not present

## 2022-12-03 DIAGNOSIS — R1310 Dysphagia, unspecified: Secondary | ICD-10-CM | POA: Diagnosis not present

## 2022-12-03 DIAGNOSIS — G588 Other specified mononeuropathies: Secondary | ICD-10-CM | POA: Diagnosis not present

## 2022-12-03 DIAGNOSIS — N5231 Erectile dysfunction following radical prostatectomy: Secondary | ICD-10-CM | POA: Diagnosis not present

## 2022-12-03 DIAGNOSIS — R102 Pelvic and perineal pain: Secondary | ICD-10-CM | POA: Diagnosis not present

## 2022-12-03 NOTE — Therapy (Signed)
OUTPATIENT SPEECH LANGUAGE PATHOLOGY VOICE TREATMENT   Patient Name: [name] MRN: [id number] DOB:1946-01-05, 77 y.o., male Today's Date: 12/03/2022  PCP: Sandre Kitty MD,  REFERRING PROVIDER: Vladimir Faster, DO  END OF SESSION:  End of Session - 12/03/22 1238     Visit Number 6    Number of Visits 17    Date for SLP Re-Evaluation 12/31/22    Authorization Type Medicare    SLP Start Time 1230    SLP Stop Time  1305    SLP Time Calculation (min) 35 min    Activity Tolerance Patient limited by pain              Past Medical History:  Diagnosis Date   Arthritis    hands    Cancer (HCC)    prostate cancer    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   Hyperlipidemia    Hypertension    Prostate cancer (HCC)    Recurrent upper respiratory infection (URI)    pt had a cold recent- week ago - better now    Tremor    Past Surgical History:  Procedure Laterality Date   CATARACT EXTRACTION Bilateral    LUMBAR LAMINECTOMY/DECOMPRESSION MICRODISCECTOMY Left 04/01/2018   Procedure: Left Lumbar four-lumbar five Microdiscectomy;  Surgeon: Maeola Harman, MD;  Location: [facility] OR;  Service: Neurosurgery;  Laterality: Left;   OTHER SURGICAL HISTORY  2005   right hand middle finger surgery    REPAIR OF CEREBROSPINAL FLUID LEAK N/A 05/18/2018   Procedure: Exploration of Lumbar four-five with repair of cerebrospinal fluid leak;  Surgeon: Maeola Harman, MD;  Location: [facility] OR;  Service: Neurosurgery;  Laterality: N/A;   ROBOT ASSISTED LAPAROSCOPIC RADICAL PROSTATECTOMY  01/13/2011   Procedure: ROBOTIC ASSISTED LAPAROSCOPIC RADICAL PROSTATECTOMY LEVEL 2;  Surgeon: Crecencio Mc, MD;  Location: [facility] ORS;  Service: Urology;  Laterality: Bilateral;  Robotic Assisted Laparoscopic Prostatetectomy with Bilateral Lymphadenectomy  Level 2   Patient Active Problem List   Diagnosis Date Noted   Hordeolum externum of right lower eyelid 12/01/2022   Anxiety 12/01/2022   Personal history of prostate cancer 11/26/2022    Chronic perineal pain in male (1ry area of Pain) 11/26/2022   Chronic postoperative pain 11/26/2022   History of prostatectomy 11/26/2022   History of implantation of artificial sphincter 11/26/2022   Complication of urinary sphincter implant, sequela 11/26/2022   Chronic pain syndrome 11/21/2022   Abnormal MRI, lumbar spine 11/21/2022   Pharmacologic therapy 11/21/2022   Disorder of skeletal system 11/21/2022   Problems influencing health status 11/21/2022   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   Plantar callus 09/29/2022   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   Chronic constipation 05/20/2022   Urinary incontinence 05/20/2022   Insomnia 05/20/2022   Restless leg 05/20/2022   Pelvic and perineal pain 01/31/2022   Status post implantation of artificial urinary sphincter 11/11/2021   Acquired cyst of kidney 07/03/2021   Male urinary stress incontinence 07/03/2021   Erectile dysfunction 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 07/03/2021   Hypertension 02/26/2021   Erythema of skin 07/24/2020   Fracture of rib 07/24/2020   Postoperative cerebrospinal fluid leak 05/17/2018   Herniated lumbar disc without myelopathy 04/01/2018   Lumbar radiculopathy 03/03/2018   Displacement of lumbar intervertebral disc without myelopathy 03/03/2018   Degenerative lumbar spinal stenosis 05/01/2017   Hip pain 05/01/2017   Essential tremor 01/24/2016   Prostate CA (HCC) 03/10/2013    Onset date: 10/23/2022  REFERRING DIAG: R49.8 (ICD-10-CM) - Hypophonia J38.3 (ICD-10-CM) - Spasmodic dysphonia  THERAPY DIAG:  Other voice and resonance disorders  Rationale for Evaluation and Treatment: Rehabilitation  SUBJECTIVE:   SUBJECTIVE STATEMENT: "I had a doctor's appointment this morning so I took my pain pill early and now I hurt".  Pt accompanied by: self   PERTINENT HISTORY: [name] is referred by Dr. Arbutus Leas who is seeing him for essential tremor. She notes low voice and spasmodic dysphonia. They held off on ENT referral as  his last appointment with her. [name] and his wife note decline in voice, volume and intelligibility over the past 5 months. [name] is referred for neuropsych testing due to memory and slow processing.   PAIN:  Are you having pain? Yes: NPRS scale: 7/10 Pain location: sacrum Pain description: sore  FALLS: Has patient fallen in last 6 months? No,  LIVING ENVIRONMENT: Lives with: lives with their spouse Lives in: House/apartment  PLOF:Level of assistance: Independent with ADLs, Needed assistance with IADLS Employment: Retired  PATIENT GOALS: "To be heard"  OBJECTIVE:    COGNITION: Overall cognitive status: Impaired Areas of impairment:  Memory: Impaired: Short term Functional deficits:   SOCIAL HISTORY: Occupation: retired Counsellor intake: optimal Caffeine/alcohol intake: minimal Daily voice use: minimal  PERCEPTUAL VOICE ASSESSMENT: Voice quality: hoarse, breathy, strained, low vocal intensity, vocal fatigue, and aphonic Vocal abuse:  n/a Resonance: normal Respiratory function: thoracic breathing  OBJECTIVE VOICE ASSESSMENT: Maximum phonation time for sustained "ah": 6.42 Conversational loudness average: 62 dB Conversational loudness range: 56-67 dB  PATIENT REPORTED OUTCOME MEASURES (PROM): Communication Effectiveness Survey: 15/32. [name] rates a 2, or minimally effective communication with family at home, with strangers, over the phone, in a noisy environment, and in the car. He rates a 1 or not at all effective conversing across a room.  TODAY'S TREATMENT:                                                                                                                                          12/03/22: [name] is completing HEP consistently twice a day - Loud Ah to recalibrate volume to average 85dB rare min A. Structured task reading 4 words and explaining which doesn't go and why average 74dB with rare min A. [name] maintained audible conversation walking out side over 15 minute  conversation with supervision cues. His pain improved with walking. Upon return inside, he maintained clear voice and average of 74dB during 5 minute conversation with supervision cues.   10/7/24[name] reports success communicating at his family reunion this weekend. He endorsed that he was mindful about speaking with intent and volume. He completed HEP with visual cues to hit 85-90dB - he benefited from seeing the sound level meter to achieve target dB. Pitch glides up with rare min A, down with usual min -mod verbal cues. Oral reading 10+ word sentences with average of 74dB mod I. In structured task generating 3 sentence descriptions with average of 70dB 21/21 sentences. In  simple conversation re: family reunion, [name] averaged 72dB with rare min A over 7 minutes. Vocal tremor reduced when he speaks with intetn  11/26/22: Pt enters 67dB - low volume.  Loud Ah to recalibrate volume and for HEP - he required verbal cues to achieve 88dB without shoulder and neck tension. He demonstrated PhoRTE HEP with occasional min A for pitch glide down only - other exercises with rare min A. He maintained volume without strain with rare min verbal cues. I increased PhoRTE sentences from 3-5 words to 5-7 words and reading for intent/volume sentences from 5-7 to 10+ words. In structured task generating 5-7 words in category using sentences, [name] averaged 72dB. In conversation re: his cousins/family reunion he averaged 70-72dB with occasional min A. Has family reunion this weekend - instructed him to practice speaking with intent as this as the event will be noisy  11/24/22: SLP further educated pt on recent ENT findings by answering remaining questions about ST prognosis and purpose of vocal fold augmentation. Endorsed completion of HEP 1-2x/day. Demonstrated exuberant voice exercises with rare min A to increase effort/volume slightly. Exhibited difficulty with pitch glides as pt expressed confusion about how to alter pitch  without changing volume. SLP explained and modeled concept of pitch vs loudness and used video feedback to increase understanding. Pt demonstrated improved understanding by repeating a pitch modeled by SLP with maintenance of targeted volume. Produced exuberant voice in sentences at various pitches and while answering structured questions given rare min A.   11/12/22: Reported mild reduction in throat soreness after daily completion of HEP (PhoRTE exercises). Demo'd exuberant voice exercises with occasional mod A to reduce vocal strain/decrease volume (initially >93 dB) during sustained phonation, maximize pitch range, and maintain effort levels. Noted mild vocal tremor during sustained phonation and difficulty gliding for pitch range. Performance mildly improved given SLP model and modifications. Provided throat clear alternatives given intermittent throat clearing exhibited. Scheduled to see ENT next week and will modify POC based on ENT observations and recommendations.   11/05/22 (eval day): Trials of exuberant voice therapy achieved improved volume and voice quality. [name] achieved and maintained 83dB average on sustained Ah! With clear phonation. He achieved WNL volume and clear voice on pitch glides as well as phonatory resistance sentences. Mild vocal tremor noted however this did not distract from his speech. Trained in HEP for hypophonia. He sees PCP later today. I instructed them to get a referral to Dr. Irene Pap, laryngologist. He does endorse pill dysphagia where pill gets stuck and he "brings it back up" several times a week. Consider MBSS   PATIENT EDUCATION: Education details: HEP for voice, vocal hygiene Person educated: Patient and Spouse Education method: Explanation, Demonstration, Verbal cues, and Handouts Education comprehension: verbalized understanding, returned demonstration, verbal cues required, and needs further education  HOME EXERCISE PROGRAM: Phonation Resistance Training  Exercises (PhoRTE), exuberant voice - will modify pending ENT consult  GOALS: Goals reviewed with patient? Yes  SHORT TERM GOALS: Target date: 12/03/22  Pt will complete HEP for voice with occasional min A over 1 week Baseline: Goal status: IN PROGRESS  2.  Pt will average 88dB on sustained vowel for 10 seconds with clear phonation Baseline:  Goal status: IN PROGRESS  3.  Pt will average 70dB 18/20 sentences with clear phonation and occasional min A Baseline:  Goal status: IMET  4.  Pt will average 70dB with clear phonation over 8 minute conversation with occasional min A  Baseline:  Goal status: MET  5.  Pt will follow swallow precautions and diet modifications pending MBSS with occasional min A Baseline:  Goal status: IN PROGRESS   LONG TERM GOALS: Target date: 12/31/22  Pt will complete HEP for voice with mod I over 1 week Baseline:  Goal status: IN PROGRESS  2.  Pt will average 70 dB with clear phonation over 15 minute conversation with rare min A Baseline:  Goal status: IN PROGRESS  3.  Pt will be intelligible over 10 minute conversation in noisy environment with rare min A Baseline:  Goal status: IN PROGRESS  4.  Pt will improve score on Communicative Effectiveness Survey by 3 points Baseline: 15 Goal status: IN PROGRESS  5.  If MBSS indicated, pt will follow diet modifications and swallow precautions with rare min A Baseline:  Goal status: IN PROGRESS   ASSESSMENT:  CLINICAL IMPRESSION: Patient is a 77 y.o. male who was seen today for dysphonia and vocal tremor. [name] presents with moderate hypophonia, hoarse, raspy voice, and intermittent aphonia. Mild vocal tremor is present. He is accompanied by his spouse, Diane who also endorses low volume and reduced intelligibility. [name] reports frequent requests to repeat himself.  He has limited phone use due to his volume/voice. [name] has had limited activity in the past year due to sacral pain, he is primarily  reclined at home. They also endorse occasional choking/coughing during meals and [name] reports his pills "sometimes get stuck and I have to spit it  back up." This occurs a couple of times a week. [name] did get improved volume and improve vocal quality with exuberant voice and phonation resistance exercises, indicating a good candidate for ST. 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 Will consider MBSS and cognitive compensations (neuropsych testing pending). I recommend skilled ST to maximize intelligibility for safety, independence and to reduce caregiver burden.  OBJECTIVE IMPAIRMENTS: include memory, voice disorder, and dysphagia. These impairments are limiting patient from managing appointments, household responsibilities, effectively communicating at home and in community, and safety when swallowing. Factors affecting potential to achieve goals and functional outcome are medical prognosis.. Patient will benefit from skilled SLP services to address above impairments and improve overall function.  REHAB POTENTIAL: Good  PLAN:  SLP FREQUENCY: 2x/week  SLP DURATION: 8 weeks  PLANNED INTERVENTIONS: Aspiration precaution training, Pharyngeal strengthening exercises, Diet toleration management , Environmental controls, Trials of upgraded texture/liquids, Cueing hierachy, Cognitive reorganization, Internal/external aids, Oral motor exercises, Functional tasks, SLP instruction and feedback, Compensatory strategies, and Patient/family education, MBSS    Nathaneal Sommers, Radene Journey, CCC-SLP 12/03/2022, 1:14 PM

## 2022-12-05 ENCOUNTER — Ambulatory Visit (HOSPITAL_COMMUNITY)
Admission: RE | Admit: 2022-12-05 | Discharge: 2022-12-05 | Disposition: A | Discharge status: Home / Self Care | Payer: Medicare Other | Source: Ambulatory Visit | Attending: Otolaryngology | Admitting: Otolaryngology

## 2022-12-05 DIAGNOSIS — R1312 Dysphagia, oropharyngeal phase: Secondary | ICD-10-CM | POA: Diagnosis not present

## 2022-12-05 DIAGNOSIS — R1310 Dysphagia, unspecified: Secondary | ICD-10-CM

## 2022-12-05 DIAGNOSIS — R1314 Dysphagia, pharyngoesophageal phase: Secondary | ICD-10-CM | POA: Insufficient documentation

## 2022-12-05 DIAGNOSIS — R490 Dysphonia: Secondary | ICD-10-CM | POA: Diagnosis not present

## 2022-12-05 DIAGNOSIS — R059 Cough, unspecified: Secondary | ICD-10-CM

## 2022-12-05 DIAGNOSIS — Z23 Encounter for immunization: Secondary | ICD-10-CM | POA: Diagnosis not present

## 2022-12-05 NOTE — Evaluation (Signed)
Modified Barium Swallow Study  Patient Details  Name: [name] MRN: [id number] Date of Birth: 1945/09/01  Today's Date: 12/05/2022  Modified Barium Swallow completed.  Full report located under Chart Review in the Imaging Section.  History of Present Illness 77 yo referred for MBS by ENT. He is being followed by SLP and ENT for dysphonia and vocal tremor but also describes pill dysphagia. He says his larger pills get stuck and he will sometimes cough them up later. Esophagram in 2023 revealed esophageal dysmotility and trace penetration. 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  Moderate postcricoid edema and pachydermia." PMH includes: spasmodic dysphonia, essential tremor, prostate ca, recurrent URI   Clinical Impression Pt presents with a mild oropharyngeal dysphagia with no penetration or aspiration observed. He has somewhat repetitive lingual movements during A/P transfer with trace lingual residue noted. He has suspected cervical osteophytes that may contribute to reduced UES opening and incomplete epiglottic inversion, although there is also minimally reduced base of tongue retraction. He has a mild collection of residue in his valleculae and at his pyriform sinuses but the barium tablet clears his pharynx with ease. Recommend continuing with regular solids and thin liquids as tolerated with ongoing f/u per primary SLP.  Factors that may increase risk of adverse event in presence of aspiration Rubye Oaks & Clearance Coots 2021): Respiratory or GI disease  Swallow Evaluation Recommendations Recommendations: PO diet PO Diet Recommendation: Regular;Thin liquids (Level 0) Liquid Administration via: Cup;Straw Medication Administration: Whole meds with  liquid (can try in purees or followed by purees PRN) Supervision: Patient able to self-feed Swallowing strategies  : Slow rate;Small bites/sips Postural changes: Position pt fully upright for meals;Stay upright 30-60 min after meals Oral care recommendations: Oral care BID (2x/day)      Mahala Menghini., M.A. CCC-SLP Acute Rehabilitation Services Office [phone]  Secure chat preferred  12/05/2022,1:11 PM

## 2022-12-08 ENCOUNTER — Ambulatory Visit: Payer: Medicare Other | Admitting: Speech Pathology

## 2022-12-08 ENCOUNTER — Encounter: Payer: Self-pay | Admitting: Speech Pathology

## 2022-12-08 DIAGNOSIS — R498 Other voice and resonance disorders: Secondary | ICD-10-CM

## 2022-12-08 DIAGNOSIS — R1310 Dysphagia, unspecified: Secondary | ICD-10-CM

## 2022-12-08 NOTE — Therapy (Unsigned)
OUTPATIENT SPEECH LANGUAGE PATHOLOGY VOICE TREATMENT   Patient Name: [name] MRN: [id number] DOB:Aug 05, 1945, 77 y.o., male Today's Date: 12/08/2022  PCP: David Kitty MD,  REFERRING PROVIDER: Vladimir Faster, DO  END OF SESSION:  End of Session - 12/08/22 1227     Visit Number 7    Number of Visits 17    Date for SLP Re-Evaluation 12/31/22    Authorization Type Medicare    Progress Note Due on Visit 10    SLP Start Time 1235    SLP Stop Time  1315    SLP Time Calculation (min) 40 min    Activity Tolerance Patient tolerated treatment well              Past Medical History:  Diagnosis Date   Arthritis    hands    Cancer (HCC)    prostate cancer    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   Hyperlipidemia    Hypertension    Prostate cancer (HCC)    Recurrent upper respiratory infection (URI)    pt had a cold recent- week ago - better now    Tremor    Past Surgical History:  Procedure Laterality Date   CATARACT EXTRACTION Bilateral    LUMBAR LAMINECTOMY/DECOMPRESSION MICRODISCECTOMY Left 04/01/2018   Procedure: Left Lumbar four-lumbar five Microdiscectomy;  Surgeon: David Harman, MD;  Location: [name];  Service: Neurosurgery;  Laterality: Left;   OTHER SURGICAL HISTORY  2005   right hand middle finger surgery    REPAIR OF CEREBROSPINAL FLUID LEAK N/A 05/18/2018   Procedure: Exploration of Lumbar four-five with repair of cerebrospinal fluid leak;  Surgeon: David Harman, MD;  Location: [facility] OR;  Service: Neurosurgery;  Laterality: N/A;   ROBOT ASSISTED LAPAROSCOPIC RADICAL PROSTATECTOMY  01/13/2011   Procedure: ROBOTIC ASSISTED LAPAROSCOPIC RADICAL PROSTATECTOMY LEVEL 2;  Surgeon: David Mc, MD;  Location: [facility];  Service: Urology;  Laterality: Bilateral;  Robotic Assisted Laparoscopic Prostatetectomy with Bilateral Lymphadenectomy  Level 2   Patient Active Problem List   Diagnosis Date Noted   Hordeolum externum of right lower eyelid 12/01/2022   Anxiety 12/01/2022   Personal  history of prostate cancer 11/26/2022   Chronic perineal pain in male (1ry area of Pain) 11/26/2022   Chronic postoperative pain 11/26/2022   History of prostatectomy 11/26/2022   History of implantation of artificial sphincter 11/26/2022   Complication of urinary sphincter implant, sequela 11/26/2022   Chronic pain syndrome 11/21/2022   Abnormal MRI, lumbar spine 11/21/2022   Pharmacologic therapy 11/21/2022   Disorder of skeletal system 11/21/2022   Problems influencing [name] status 11/21/2022   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   Plantar callus 09/29/2022   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   Chronic constipation 05/20/2022   Urinary incontinence 05/20/2022   Insomnia 05/20/2022   Restless leg 05/20/2022   Pelvic and perineal pain 01/31/2022   Status post implantation of artificial urinary sphincter 11/11/2021   Acquired cyst of kidney 07/03/2021   Male urinary stress incontinence 07/03/2021   Erectile dysfunction 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 07/03/2021   Hypertension 02/26/2021   Erythema of skin 07/24/2020   Fracture of rib 07/24/2020   Postoperative cerebrospinal fluid leak 05/17/2018   Herniated lumbar disc without myelopathy 04/01/2018   Lumbar radiculopathy 03/03/2018   Displacement of lumbar intervertebral disc without myelopathy 03/03/2018   Degenerative lumbar spinal stenosis 05/01/2017   Hip pain 05/01/2017   Essential tremor 01/24/2016   Prostate CA (HCC) 03/10/2013    Onset date: 10/23/2022  REFERRING DIAG: R49.8 (ICD-10-CM) -  Hypophonia J38.3 (ICD-10-CM) - Spasmodic dysphonia  THERAPY DIAG: Pill dysphagia  Other voice and resonance disorders  Rationale for Evaluation and Treatment: Rehabilitation  SUBJECTIVE:   SUBJECTIVE STATEMENT: Pt reports using intent on the phone with his eye doctor today.  Pt accompanied by: self   PERTINENT HISTORY: [name] is referred by Dr. Arbutus [name] who is seeing him for essential tremor. She notes low voice and spasmodic dysphonia.  They held off on ENT referral as his last appointment with her. [name] and his wife note decline in voice, volume and intelligibility over the past 5 months. [name] is referred for neuropsych testing due to memory and slow processing.   PAIN:  Are you having pain? No  FALLS: Has patient fallen in last 6 months? No,  LIVING ENVIRONMENT: Lives with: lives with their spouse Lives in: House/apartment  PLOF:Level of assistance: Independent with ADLs, Needed assistance with IADLS Employment: Retired  PATIENT GOALS: "To be heard"  OBJECTIVE:    COGNITION: Overall cognitive status: Impaired Areas of impairment:  Memory: Impaired: Short term Functional deficits:   SOCIAL HISTORY: Occupation: retired Counsellor intake: optimal Caffeine/alcohol intake: minimal Daily voice use: minimal  PERCEPTUAL VOICE ASSESSMENT: Voice quality: hoarse, breathy, strained, low vocal intensity, vocal fatigue, and aphonic Vocal abuse:  n/a Resonance: normal Respiratory function: thoracic breathing  OBJECTIVE VOICE ASSESSMENT: Maximum phonation time for sustained "ah": 6.42 Conversational loudness average: 62 dB Conversational loudness range: 56-67 dB  PATIENT REPORTED OUTCOME MEASURES (PROM): Communication Effectiveness Survey: 15/32. [name] rates a 2, or minimally effective communication with family at home, with strangers, over the phone, in a noisy environment, and in the car. He rates a 1 or not at all effective conversing across a room.  TODAY'S TREATMENT:                                                                                                                                          12/08/22: Endorses completing HEP twice a day most days. Loud Ah and pitch glide up averaged 89 dB with no cues. Required occasional mod fading to rare min A to optimize pitch glide down. Averaged ~75 dB when reading sentences in high, normal, and low pitch. Maintained 72 dB in structured conversation given rare min A  to increase volume. Reports feeling confident completing PhoRTE HEP independently. He is using intentional voice in the community, although his wife occasionally cues him to increase volume. SLP educated pt on using effortful swallow to optimize swallow function. Re-administered Communicative Effectiveness Survey with pt scoring 23.5/32. He scored a 2, or minimally effective, for conversing in noisy environment; but otherwise rated other communication scenarios as a 3 or 3.5, pretty effective. Goals met, d/c ST. Pt agreeable to d/c.  12/03/22: [name] is completing HEP consistently twice a day - Loud Ah to recalibrate volume to average 85dB rare min A. Structured task reading 4 words and explaining which  doesn't go and why average 74dB with rare min A. [name] maintained audible conversation walking out side over 15 minute conversation with supervision cues. His pain improved with walking. Upon return inside, he maintained clear voice and average of 74dB during 5 minute conversation with supervision cues.   10/7/24Raiford [name] reports success communicating at his family reunion this weekend. He endorsed that he was mindful about speaking with intent and volume. He completed HEP with visual cues to hit 85-90dB - he benefited from seeing the sound level meter to achieve target dB. Pitch glides up with rare min A, down with usual min -mod verbal cues. Oral reading 10+ word sentences with average of 74dB mod I. In structured task generating 3 sentence descriptions with average of 70dB 21/21 sentences. In simple conversation re: family reunion, [name] averaged 72dB with rare min A over 7 minutes. Vocal tremor reduced when he speaks with intetn  11/26/22: Pt enters 67dB - low volume.  Loud Ah to recalibrate volume and for HEP - he required verbal cues to achieve 88dB without shoulder and neck tension. He demonstrated PhoRTE HEP with occasional min A for pitch glide down only - other exercises with rare min A. He maintained volume  without strain with rare min verbal cues. I increased PhoRTE sentences from 3-5 words to 5-7 words and reading for intent/volume sentences from 5-7 to 10+ words. In structured task generating 5-7 words in category using sentences, [name] averaged 72dB. In conversation re: his cousins/family reunion he averaged 70-72dB with occasional min A. Has family reunion this weekend - instructed him to practice speaking with intent as this as the event will be noisy  11/24/22: SLP further educated pt on recent ENT findings by answering remaining questions about ST prognosis and purpose of vocal fold augmentation. Endorsed completion of HEP 1-2x/day. Demonstrated exuberant voice exercises with rare min A to increase effort/volume slightly. Exhibited difficulty with pitch glides as pt expressed confusion about how to alter pitch without changing volume. SLP explained and modeled concept of pitch vs loudness and used video feedback to increase understanding. Pt demonstrated improved understanding by repeating a pitch modeled by SLP with maintenance of targeted volume. Produced exuberant voice in sentences at various pitches and while answering structured questions given rare min A.   11/12/22: Reported mild reduction in throat soreness after daily completion of HEP (PhoRTE exercises). Demo'd exuberant voice exercises with occasional mod A to reduce vocal strain/decrease volume (initially >93 dB) during sustained phonation, maximize pitch range, and maintain effort levels. Noted mild vocal tremor during sustained phonation and difficulty gliding for pitch range. Performance mildly improved given SLP model and modifications. Provided throat clear alternatives given intermittent throat clearing exhibited. Scheduled to see ENT next week and will modify POC based on ENT observations and recommendations.   11/05/22 (eval day): Trials of exuberant voice therapy achieved improved volume and voice quality. [name] achieved and maintained  83dB average on sustained Ah! With clear phonation. He achieved WNL volume and clear voice on pitch glides as well as phonatory resistance sentences. Mild vocal tremor noted however this did not distract from his speech. Trained in HEP for hypophonia. He sees PCP later today. I instructed them to get a referral to Dr. Irene Pap, laryngologist. He does endorse pill dysphagia where pill gets stuck and he "brings it back up" several times a week. Consider MBSS   PATIENT EDUCATION: Education details: HEP for voice, vocal hygiene Person educated: Patient and Spouse Education method: Explanation, Demonstration, Verbal cues, and  Handouts Education comprehension: verbalized understanding, returned demonstration, verbal cues required, and needs further education  HOME EXERCISE PROGRAM: Phonation Resistance Training Exercises (PhoRTE), exuberant voice - will modify pending ENT consult  SPEECH THERAPY DISCHARGE SUMMARY  Visits from Start of Care: 7  Current functional level related to goals / functional outcomes: See goals   Remaining deficits: Mild dysphonia and vocal tremor   Education / Equipment: HEP and compensations for dysphonia   Patient agrees to discharge. Patient goals were met. Patient is being discharged due to meeting the stated rehab goals.Marland Kitchen   GOALS: Goals reviewed with patient? Yes  SHORT TERM GOALS: Target date: 12/03/22  Pt will complete HEP for voice with occasional min A over 1 week Baseline: Goal status: MET  2.  Pt will average 88dB on sustained vowel for 10 seconds with clear phonation Baseline:  Goal status: MET  3.  Pt will average 70dB 18/20 sentences with clear phonation and occasional min A Baseline:  Goal status: MET  4.  Pt will average 70dB with clear phonation over 8 minute conversation with occasional min A  Baseline:  Goal status: MET  5.  Pt will follow swallow precautions and diet modifications pending MBSS with occasional min A Baseline:   Goal status: DEFERRED, MBSS normal   LONG TERM GOALS: Target date: 12/31/22  Pt will complete HEP for voice with mod I over 1 week Baseline:  Goal status: MET  2.  Pt will average 70 dB with clear phonation over 15 minute conversation with rare min A Baseline:  Goal status: MET  3.  Pt will be intelligible over 10 minute conversation in noisy environment with rare min A Baseline:  Goal status: MET  4.  Pt will improve score on Communicative Effectiveness Survey by 3 points Baseline: 15 Goal status: MET, improved to 23.5  5.  If MBSS indicated, pt will follow diet modifications and swallow precautions with rare min A Baseline:  Goal status: DEFERRED, MBSS normal   ASSESSMENT:  CLINICAL IMPRESSION: Patient is a 77 y.o. male who was seen today for dysphonia and vocal tremor. [name] presents with moderate hypophonia, hoarse, raspy voice, and intermittent aphonia. Mild vocal tremor is present. 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 Vocal quality and intelligibility increases significantly when pt uses exuberant voice. Endorses daily completion of PhoRTE HEP. Reports using exuberant voice outside of therapy with occasional cues from his wife to increase volume. Goals met, d/c ST. Pt agreeable to d/c.   OBJECTIVE IMPAIRMENTS: include memory, voice disorder, and dysphagia. These impairments are limiting patient from managing appointments, household responsibilities, effectively communicating at home and in community, and safety when swallowing. Factors affecting potential to achieve goals and functional outcome are medical prognosis.. Patient will benefit from skilled SLP services to address above impairments and improve overall function.  REHAB POTENTIAL: Good  PLAN:  SLP FREQUENCY: 2x/week  SLP DURATION: 8 weeks  PLANNED INTERVENTIONS: Aspiration precaution training, Pharyngeal strengthening exercises, Diet toleration management ,  Environmental controls, Trials of upgraded texture/liquids, Cueing hierachy, Cognitive reorganization, Internal/external aids, Oral motor exercises, Functional tasks, SLP instruction and feedback, Compensatory strategies, and Patient/family education, MBSS    Lamont Glasscock, Student-SLP 12/08/2022, 2:54 PM

## 2022-12-08 NOTE — Patient Instructions (Addendum)
   Keep up the voice exercises twice as day (as able) for 6 weeks, then 1 x a day 5/7 days for 4 more weeks  [place] job speaking with intent!! Keep it up - get in the mindset of speaking with intent before you make a phone call, get together with friends or family,   You will have to be mindful to keep up the    When you are eating, be mindful and swallow with intent - you have to think about your swallowing the same way you speak   Limit distractions with meals - focus on swallowing with effort  Think Deep when you are going down in pitch - great job today

## 2022-12-10 ENCOUNTER — Encounter: Payer: Medicare Other | Admitting: Speech Pathology

## 2022-12-11 ENCOUNTER — Telehealth (INDEPENDENT_AMBULATORY_CARE_PROVIDER_SITE_OTHER): Payer: Self-pay | Admitting: Otolaryngology

## 2022-12-11 NOTE — Telephone Encounter (Signed)
Cancelling appt for 11/14 due to wife having procedure.  Decided not to go with either of the treatments with the shots.  Swallow study came back ok.  If needed please call patient back at [phone].

## 2022-12-12 NOTE — Telephone Encounter (Signed)
Ok thanks, you can let him know that he can call and schedule follow up visit at his convenience to go over his study results.

## 2022-12-15 ENCOUNTER — Encounter: Payer: Medicare Other | Admitting: Speech Pathology

## 2022-12-17 ENCOUNTER — Encounter: Payer: Medicare Other | Admitting: Speech Pathology

## 2022-12-22 ENCOUNTER — Encounter: Payer: Medicare Other | Admitting: Speech Pathology

## 2023-01-08 ENCOUNTER — Ambulatory Visit (INDEPENDENT_AMBULATORY_CARE_PROVIDER_SITE_OTHER): Payer: Medicare Other | Admitting: Otolaryngology

## 2023-01-08 DIAGNOSIS — H0019 Chalazion unspecified eye, unspecified eyelid: Secondary | ICD-10-CM | POA: Diagnosis not present

## 2023-01-19 DIAGNOSIS — L812 Freckles: Secondary | ICD-10-CM | POA: Diagnosis not present

## 2023-01-19 DIAGNOSIS — L570 Actinic keratosis: Secondary | ICD-10-CM | POA: Diagnosis not present

## 2023-01-19 DIAGNOSIS — L821 Other seborrheic keratosis: Secondary | ICD-10-CM | POA: Diagnosis not present

## 2023-01-19 DIAGNOSIS — Z85820 Personal history of malignant melanoma of skin: Secondary | ICD-10-CM | POA: Diagnosis not present

## 2023-01-19 DIAGNOSIS — D1801 Hemangioma of skin and subcutaneous tissue: Secondary | ICD-10-CM | POA: Diagnosis not present

## 2023-01-19 DIAGNOSIS — Z85828 Personal history of other malignant neoplasm of skin: Secondary | ICD-10-CM | POA: Diagnosis not present

## 2023-02-02 ENCOUNTER — Ambulatory Visit (INDEPENDENT_AMBULATORY_CARE_PROVIDER_SITE_OTHER): Payer: Medicare Other | Admitting: Family Medicine

## 2023-02-02 ENCOUNTER — Encounter: Payer: Self-pay | Admitting: Family Medicine

## 2023-02-02 VITALS — BP 108/66 | HR 65 | Ht 71.0 in | Wt 179.4 lb

## 2023-02-02 DIAGNOSIS — H00012 Hordeolum externum right lower eyelid: Secondary | ICD-10-CM

## 2023-02-02 DIAGNOSIS — R102 Pelvic and perineal pain: Secondary | ICD-10-CM

## 2023-02-02 DIAGNOSIS — J383 Other diseases of vocal cords: Secondary | ICD-10-CM

## 2023-02-02 DIAGNOSIS — G588 Other specified mononeuropathies: Secondary | ICD-10-CM

## 2023-02-02 NOTE — Patient Instructions (Signed)
it was nice to see you today,  We addressed the following topics today: -I will send in a referral to the ophthalmologist regarding your eye. - I will send in a referral to a sports medicine clinic that does shockwave therapy. - If you have not heard from either of these places in the next week let us know and we will give you the names of the locations we sent referral to - You can follow-up with me in 3 to 4 months after you have done your annual wellness visit and your lab work.  Have a great day,  Frederic Jericho, MD

## 2023-02-02 NOTE — Progress Notes (Unsigned)
   Established Patient Office Visit  Subjective   Patient ID: ASH KOSIOR, male    DOB: [birth year]  Age: 77 y.o. MRN: [id number]  No chief complaint on file.   HPI  Groin pain-shockwave therapy?  Voice-not going through with procedures?  Hypertension-did he change any of his medications?  Is he taking his blood pressure readings at home?  Amlodipine and losartan. Spoke to them.  Put it off.  No lows since that time.    Left eye stye - right eye.  Stye.  Optometrist.  Using compress.  Referral to ophtho.     The ASCVD Risk score (Arnett DK, et al., 2019) failed to calculate for the following reasons:   Cannot find a previous HDL lab   Cannot find a previous total cholesterol lab  Health Maintenance Due  Topic Date Due  . Hepatitis C Screening  Never done  . Medicare Annual Wellness (AWV)  01/31/2022  . COVID-19 Vaccine (4 - 2023-24 season) 10/26/2022      Objective:     There were no vitals taken for this visit. {Vitals History (Optional):23777}  Physical Exam   No results found for any visits on 02/02/23.      Assessment & Plan:   There are no diagnoses linked to this encounter.   No follow-ups on file.    Sandre Kitty, MD

## 2023-02-03 ENCOUNTER — Ambulatory Visit (INDEPENDENT_AMBULATORY_CARE_PROVIDER_SITE_OTHER): Payer: Medicare Other | Admitting: Family Medicine

## 2023-02-03 ENCOUNTER — Encounter: Payer: Self-pay | Admitting: Family Medicine

## 2023-02-03 VITALS — BP 138/82 | Ht 71.0 in | Wt 178.0 lb

## 2023-02-03 DIAGNOSIS — R102 Pelvic and perineal pain: Secondary | ICD-10-CM | POA: Diagnosis not present

## 2023-02-03 DIAGNOSIS — G8929 Other chronic pain: Secondary | ICD-10-CM | POA: Diagnosis not present

## 2023-02-03 NOTE — Assessment & Plan Note (Signed)
Chronic perineal pain since AUS urologic procedure 09/23/2021, has failed multiple medications, as well as multiple nerve blocks and pelvic floor therapy.  Surgeon uncertain if this is related to the procedure, as he has never seen this side effect as an adverse event related to the AUS procedure  Plan: -Reviewed visit notes with urologist Dr.Terlecki from 12/03/2022 with findings as summarized as noted in the HPI -Reviewed MRI pelvis results as noted in Care Everywhere and as noted above -Reviewed procedure notes from 04/21/2022 and 03/18/2022 and 01/22/2022 for nerve block procedures, with summary of findings as noted in the HPI -Reviewed recent visit notes with PCP from 02/02/2023.  PCP wondering if shockwave therapy may be potential treatment.  Based on patient's presentation and other failed therapies, as well as having a prosthetic implant in place, he is not a candidate for shockwave therapy.  Shockwave therapy is contraindicated around a prosthetic implant as the shockwaves could potentially damage the implant and surrounding tissue. -At this time I recommend patient keep appointment as scheduled with urology specialist 03/02/2023 Dr. Gala Lewandowsky -Should continue to follow-up with regular urologist Dr. Gillian Shields as scheduled -Follow-up with PCP as scheduled -Follow-up with [place] as needed, but as discussed, unfortunately we do not have any treatment options available for him at this time

## 2023-02-03 NOTE — Assessment & Plan Note (Signed)
Patient has exhausted all treatment options for his perineal pain associated with his artificial urinary sphincter.  He heard about shockwave therapy from someone at his orthopedist office a few months ago.  I discussed it with him further.  Advised him that I believe they have this device at the sports medicine residency clinic.  Advised him we could refer him to their to see if he is appropriate for shockwave therapy.  Patient agreeable to this.

## 2023-02-03 NOTE — Assessment & Plan Note (Signed)
Patient doing exercises for vocal strengthening but is holding off on any of the procedures for treatment of this until his wife finishes having her surgical procedures so that he can help her.

## 2023-02-03 NOTE — Assessment & Plan Note (Signed)
Has failed conservative management.  Symptoms ongoing for at least 2 months.  Put a referral in for ophthalmology

## 2023-02-03 NOTE — Progress Notes (Signed)
DATE OF VISIT: 02/03/2023        [name] DOB: [birth year] MRN: [id number]  CC:  Pelvic pain  History- [name] is a 77 y.o.  male for evaluation and treatment of pelvic pain PMH significant for RALP (robotic assisted laparoscopic prostatectomy) with [facility]  S/p RALP Nov 2012. 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 Put on ADT (androgen deprivation therapy) 2013. Salvage XRT not until 2017. PSA now undetectable. He developed stress incontinence and underwent AUS (artificial urinary sphincter) placement on September 23, 2021. Following activation, he was quite pleased that he was dry but he has had issues with significant perineal pain. ED since RALP.   Last visit with urology 12/03/22 - Dr Gillian Shields, MD - notes from that visit reviewed today in CareEverywhere and summarized above  Cysto has been normal with no evidence of erosion. MRI done in August shows no fluid collections or inflammatory change. He sought out nerve blocks and denervations with interventional radiology and meds with pain services, all without relief.  - CT-guided percutaneous bilateral pudendal nerve block with bupivacaine &  CT-guided percutaneous phenol ablation of the inferior presacral hypogastric plexus  on 04/21/22 - CT-guided percutaneous blockade of the superior hypogastric plexus for management of pelvic pain refractory to prior pudendal nerve blockade  on 03/18/22 - CT-guided bilateral pudendal nerve block with phenol on 01/22/22  No improvement with: - Gabapentin 1800mg /day Lyrica 150mg  tid has been somewhat helpful, but still having pain  No improvement with sitting on donut pillow/cushion Most comfortable laying down Ongoing pain in the perianal area, some radiation to the base of the penis No improvement with extensive pelvic floor therapy  Has been offered AUS removal, but uncertain if this would help his symptoms - despite the pain, has had  improved incontinence due to AUS and is pleased with that outcome  Surgeon notes he has never seen chronic pain as result of AUS procedure - offered to refer him to more experienced surgeon (Dr Gala Lewandowsky) -- scheduled for 03/02/23 to see Dr Gala Lewandowsky  Referred by PCP - Dr Lenor Coffin for consideration of shockwave therapy  Past Medical History Past Medical History:  Diagnosis Date   Arthritis    hands    Cancer [facility])    prostate cancer    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   Hyperlipidemia    Hypertension    Prostate cancer (HCC)    Recurrent upper respiratory infection (URI)    pt had a cold recent- week ago - better now    Tremor     Past Surgical History Past Surgical History:  Procedure Laterality Date   CATARACT EXTRACTION Bilateral    LUMBAR LAMINECTOMY/DECOMPRESSION MICRODISCECTOMY Left 04/01/2018   Procedure: Left Lumbar four-lumbar five Microdiscectomy;  Surgeon: Maeola Harman, MD;  Location: [facility] OR;  Service: Neurosurgery;  Laterality: Left;   OTHER SURGICAL HISTORY  2005   right hand middle finger surgery    REPAIR OF CEREBROSPINAL FLUID LEAK N/A 05/18/2018   Procedure: Exploration of Lumbar four-five with repair of cerebrospinal fluid leak;  Surgeon: Maeola Harman, MD;  Location: [facility] OR;  Service: Neurosurgery;  Laterality: N/A;   ROBOT ASSISTED LAPAROSCOPIC RADICAL PROSTATECTOMY  01/13/2011   Procedure: ROBOTIC ASSISTED LAPAROSCOPIC RADICAL PROSTATECTOMY LEVEL 2;  Surgeon: Crecencio Mc, MD;  Location: [facility] ORS;  Service: Urology;  Laterality: Bilateral;  Robotic Assisted Laparoscopic Prostatetectomy with Bilateral Lymphadenectomy  Level 2    Medications Current Outpatient Medications  Medication Sig Dispense Refill  pregabalin (LYRICA) 150 MG capsule Take 150 mg by mouth daily.     acetaminophen (TYLENOL) 325 MG tablet Take 325 mg by mouth every 6 (six) hours as needed for mild pain, moderate pain, fever or headache.     amLODipine (NORVASC) 2.5 MG tablet Take 1 tablet (2.5 mg total) by  mouth daily. 90 tablet 3   ascorbic acid (VITAMIN C) 1000 MG tablet Take 800 mg by mouth daily. Pt takes 5 times a week     Cholecalciferol (VITAMIN D-3) 25 MCG (1000 UT) CAPS Take by mouth.     LORazepam (ATIVAN) 0.5 MG tablet Take 1 tablet (0.5 mg total) by mouth as directed. For Anxiety 15 tablet 0   losartan (COZAAR) 100 MG tablet Take 1 tablet (100 mg total) by mouth daily. 90 tablet 3   magnesium (MAGTAB) 84 MG ( ) TBCR SR tablet Take 84 mg by mouth.     Magnesium Oxide -Mg Supplement 200 MG TABS TAKE 1 TABLET BY MOUTH IN THE MORNING AND AT BEDTIME 60 tablet 0   primidone (MYSOLINE) 250 MG tablet Take 1 tablet (250 mg total) by mouth in the morning. 90 tablet 1   primidone (MYSOLINE) 50 MG tablet Pt. Takes 5 tablets 100 mg in the AM, and 1 tablet 50 mg in the PM 540 tablet 0   psyllium (REGULOID) 0.52 g capsule Take 0.52 g by mouth daily. 2 capsules by mouth twice daily     rosuvastatin (CRESTOR) 10 MG tablet Take 1 tablet (10 mg total) by mouth daily. 90 tablet 3   vitamin B-12 (CYANOCOBALAMIN) 500 MCG tablet Take 500 mcg by mouth daily.     zolpidem (AMBIEN CR) 12.5 MG CR tablet Take by mouth at bedtime. 1/2 to 1 tablet daily at bedtime     No current facility-administered medications for this visit.    Allergies is allergic to imipramine, lisinopril-hydrochlorothiazide, meloxicam, and tape.  Family History - reviewed per EMR and intake form  Social History   reports that he does not currently use alcohol.  reports that he has never smoked. He has never used smokeless tobacco.  reports no history of drug use.   EXAM: Vitals: BP 138/82   Ht 5\' 11"  (1.803 m)   Wt 178 lb (80.7 kg)   BMI 24.83 kg/m  General: AOx3, NAD, pleasant MSK: Bilateral hip with slightly decreased range of motion without pain.  He has no tenderness to palpation over the anterior or lateral hips.  Good hip strength bilaterally.  Able to perform a bridge position without any difficulty.  Good hamstring  flexibility bilaterally. Normal gait GU: Normal male external genitalia.  Static implant palpable along the right scrotum.  Slight tenderness along the distal aspect of the prosthesis.  He has no palpable inguinal pain.  No palpable hernias with Valsalva.  He does have tenderness to deep palpation over the perineum.  NEURO: sensation intact to light touch in the groin and perineum   IMAGING: MRI PELVIS W/O CONTRAST 10/29/22 showing: INDICATION: h/o of persistent pelvic pain, prosthetic urethral spinchter., Pelvic and perineal pain \ R10.2 Pelvic and perineal pain  COMPARISON: CT abdomen pelvis 01/01/2022   TECHNIQUE: Multi-planar, multi-sequence MR images of the pelvis were obtained without the use of intravenous contrast.   ADDITIONAL HISTORY: None.   FINDINGS:   . Peritoneum/Mesenteries/Extraperitoneum: No significant ascites or lymphadenopathy.  .  Gastrointestinal tract: No evidence of obstruction.   .  Ureters: Within normal limits.  .  Bladder: Within normal limits.  Marland Kitchen  Reproductive System: Similar appearance of the artificial urethral sphincter, including unremarkable appearance of the control pump in the right hemiscrotum and reservoir in the right hemipelvis. Similar orientation of the urethral cuff, which again is rotated to the left. Mild ureteral dilation is noted proximal to the cuff. No evidence of surrounding soft tissue inflammatory change or free fluid. Prostatectomy.   .  Musculoskeletal: Fat containing left inguinal hernia.   IMPRESSION:   No substantial interval change in appearance of the artificial urethral sphincter with similar leftward rotation of the urethral cuff. No evidence of complication.    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  Surgeon uncertain if this is related to the  procedure, as he has never seen this side effect as an adverse event related to the AUS procedure  Plan: -Reviewed visit notes with urologist Dr.Terlecki from 12/03/2022 with findings as summarized as noted in the HPI -Reviewed MRI pelvis results as noted in Care Everywhere and as noted above -Reviewed procedure notes from 04/21/2022 and 03/18/2022 and 01/22/2022 for nerve block procedures, with summary of findings as noted in the HPI -Reviewed recent visit notes with PCP from 02/02/2023.  PCP wondering if shockwave therapy may be potential treatment.  Based on patient's presentation and other failed therapies, as well as having a prosthetic implant in place, he is not a candidate for shockwave therapy.  Shockwave therapy is contraindicated around a prosthetic implant as the shockwaves could potentially damage the implant and surrounding tissue. -At this time I recommend patient keep appointment as scheduled with urology specialist 03/02/2023 Dr. Gala Lewandowsky -Should continue to follow-up with regular urologist Dr. Gillian Shields as scheduled -Follow-up with PCP as scheduled -Follow-up with [place] as needed, but as discussed, unfortunately we do not have any treatment options available for him at this time  Patient expressed understanding & agreement with above.  45 mins spent on encounter today including face-to-face time, preparation to see the patient, as well as: - review of imaging (x-ray, MRI, etc.) and reviewing with patient/family - reviewing separately obtained history (review of external notes) - as documented above - discussion of treatment options as noted above - completing documentation.      Encounter Diagnosis  Name Primary?   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   No orders of the defined types were placed in this encounter.

## 2023-02-13 DIAGNOSIS — H0012 Chalazion right lower eyelid: Secondary | ICD-10-CM | POA: Diagnosis not present

## 2023-03-02 DIAGNOSIS — R102 Pelvic and perineal pain: Secondary | ICD-10-CM | POA: Diagnosis not present

## 2023-03-02 DIAGNOSIS — N3941 Urge incontinence: Secondary | ICD-10-CM | POA: Diagnosis not present

## 2023-03-05 DIAGNOSIS — G588 Other specified mononeuropathies: Secondary | ICD-10-CM | POA: Diagnosis not present

## 2023-03-10 ENCOUNTER — Ambulatory Visit: Payer: Medicare Other

## 2023-03-10 DIAGNOSIS — Z0000 Encounter for general adult medical examination without abnormal findings: Secondary | ICD-10-CM

## 2023-03-10 NOTE — Patient Instructions (Signed)
 [name] , Thank you for taking time to come for your Medicare Wellness Visit. I appreciate your ongoing commitment to your health goals. Please review the following plan we discussed and let me know if I can assist you in the future.   Referrals/Orders/Follow-Ups/Clinician Recommendations: none  This is a list of the screening recommended for you and due dates:  Health Maintenance  Topic Date Due   Hepatitis C Screening  Never done   COVID-19 Vaccine (5 - 2024-25 season) 01/30/2023   Medicare Annual Wellness Visit  03/09/2024   DTaP/Tdap/Td vaccine (4 - Td or Tdap) 12/12/2029   Pneumonia Vaccine  Completed   Flu Shot  Completed   Zoster (Shingles) Vaccine  Completed   HPV Vaccine  Aged Out   Colon Cancer Screening  Discontinued    Advanced directives: (Copy Requested) Please bring a copy of your health care power of attorney and living will to the office to be added to your chart at your convenience.  Next Medicare Annual Wellness Visit scheduled for next year: Yes  insert Preventive Care attachment Insert FALL PREVENTION attachment if needed

## 2023-03-10 NOTE — Progress Notes (Signed)
 Subjective:   [name] is a 78 y.o. male who presents for Medicare Annual/Subsequent preventive examination.  Visit Complete: Virtual I connected with  [name] on 03/10/23 by a audio enabled telemedicine application and verified that I am speaking with the correct person using two identifiers.  Interactive audio and video telecommunications were attempted between this provider and patient, however failed, due to patient having technical difficulties OR patient did not have access to video capability.  We continued and completed visit with audio only.  Patient Location: Home  Provider Location: Office/Clinic  I discussed the limitations of evaluation and management by telemedicine. The patient expressed understanding and agreed to proceed.  Vital Signs: Because this visit was a virtual/telehealth visit, some criteria may be missing or patient reported. Any vitals not documented were not able to be obtained and vitals that have been documented are patient reported.    Cardiac Risk Factors include: advanced age (>17men, >49 women);hypertension;male gender     Objective:    Today's Vitals   03/10/23 1046  PainSc: 3    There is no height or weight on file to calculate BMI.     03/10/2023   11:00 AM 11/05/2022    1:19 PM 10/23/2022    2:33 PM 08/06/2021    9:14 AM 01/03/2021   11:14 AM 11/04/2020   10:05 AM 07/10/2020   12:35 PM  Advanced Directives  Does Patient Have a Medical Advance Directive? Yes Yes Yes Yes Yes Yes Yes  Type of Estate Agent of [place];Living will Living will Living will Healthcare Power of [place];Living will Healthcare Power of [place];Living will;Out of facility DNR (pink MOST or yellow form) Healthcare Power of [place] of [place];Living will;Out of facility DNR (pink MOST or yellow form)  Does patient want to make changes to medical advance directive?  No - Patient declined       Copy of Healthcare Power  of Attorney in Chart? No - copy requested          Current Medications (verified) Outpatient Encounter Medications as of 03/10/2023  Medication Sig   acetaminophen  (TYLENOL ) 325 MG tablet Take 325 mg by mouth every 6 (six) hours as needed for mild pain, moderate pain, fever or headache.   amLODipine  (NORVASC ) 2.5 MG tablet Take 1 tablet (2.5 mg total) by mouth daily.   ascorbic acid  (VITAMIN C ) 1000 MG tablet Take 800 mg by mouth daily. Pt takes 5 times a week   b complex vitamins capsule Take 1 capsule by mouth daily.   Cholecalciferol  (VITAMIN D -3) 25 MCG (1000 UT) CAPS Take by mouth.   LORazepam  (ATIVAN ) 0.5 MG tablet Take 1 tablet (0.5 mg total) by mouth as directed. For Anxiety   losartan  (COZAAR ) 100 MG tablet Take 1 tablet (100 mg total) by mouth daily.   magnesium  (MAGTAB) 84 MG ( ) TBCR SR tablet Take 84 mg by mouth.   Magnesium  Oxide -Mg Supplement 200 MG TABS TAKE 1 TABLET BY MOUTH IN THE MORNING AND AT BEDTIME   pregabalin (LYRICA) 150 MG capsule Take 150 mg by mouth in the morning, at noon, and at bedtime.   primidone  (MYSOLINE ) 250 MG tablet Take 1 tablet (250 mg total) by mouth in the morning.   primidone  (MYSOLINE ) 50 MG tablet Pt. Takes 5 tablets 100 mg in the AM, and 1 tablet 50 mg in the PM   psyllium (REGULOID) 0.52 g capsule Take 0.52 g by mouth daily. 2 capsules by mouth  twice daily   rosuvastatin  (CRESTOR ) 10 MG tablet Take 1 tablet (10 mg total) by mouth daily.   zolpidem  (AMBIEN  CR) 12.5 MG CR tablet Take by mouth at bedtime. 1/2 to 1 tablet daily at bedtime   vitamin B-12 (CYANOCOBALAMIN ) 500 MCG tablet Take 500 mcg by mouth daily. (Patient not taking: Reported on 03/10/2023)   No facility-administered encounter medications on file as of 03/10/2023.    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   Hyperlipidemia    Hypertension    Prostate cancer (HCC)    Recurrent upper respiratory infection (URI)    pt had a cold recent- week ago - better now    Tremor    Past Surgical History:  Procedure Laterality Date   CATARACT EXTRACTION Bilateral    LUMBAR LAMINECTOMY/DECOMPRESSION MICRODISCECTOMY Left 04/01/2018   Procedure: Left Lumbar four-lumbar five Microdiscectomy;  Surgeon: Unice Pac, MD;  Location: [facility] OR;  Service: Neurosurgery;  Laterality: Left;   OTHER SURGICAL HISTORY  2005   right hand middle finger surgery    REPAIR OF CEREBROSPINAL FLUID LEAK N/A 05/18/2018   Procedure: Exploration of Lumbar four-five with repair of cerebrospinal fluid leak;  Surgeon: Unice Pac, MD;  Location: [facility] OR;  Service: Neurosurgery;  Laterality: N/A;   ROBOT ASSISTED LAPAROSCOPIC RADICAL PROSTATECTOMY  01/13/2011   Procedure: ROBOTIC ASSISTED LAPAROSCOPIC RADICAL PROSTATECTOMY LEVEL 2;  Surgeon: Noretta Ferrara, MD;  Location: [facility] ORS;  Service: Urology;  Laterality: Bilateral;  Robotic Assisted Laparoscopic Prostatetectomy with Bilateral Lymphadenectomy  Level 2   Family History  Problem Relation Age of Onset   Heart attack Father    Hypertension Father    Cancer Mother        cervical with mets to lung   Hypertension Sister    Healthy Child    Social History   Socioeconomic History   Marital status: Married    Spouse name: Not on file   Number of children: 2   Years of education: Bachelors   Highest education level: Bachelor's degree (e.g., BA, AB, BS)  Occupational History   Occupation: Retired    Comment: art gallery manager  Tobacco Use   Smoking status: Never   Smokeless tobacco: Never  Vaping Use   Vaping status: Never Used  Substance and Sexual Activity   Alcohol use: Not Currently    Comment: 1 drink per day or less   Drug use: No   Sexual activity: Never  Other Topics Concern   Not on file  Social History Narrative   Lives at home with his wife.   Right-handed.    No caffeine use.   Social Drivers of Corporate Investment Banker Strain: Low Risk  (03/10/2023)   Overall Financial Resource Strain (CARDIA)    Difficulty of Paying Living Expenses: Not hard at all  Food Insecurity: No Food Insecurity (03/10/2023)   Hunger Vital Sign    Worried About Running Out of Food in the Last Year: Never true    Ran Out of Food in the Last Year: Never true  Transportation Needs: No Transportation Needs (03/10/2023)   PRAPARE - Administrator, Civil Service (Medical): No    Lack of Transportation (Non-Medical): No  Physical Activity: Insufficiently Active (03/10/2023)   Exercise Vital Sign    Days of Exercise per  Week: 7 days    Minutes of Exercise per Session: 20 min  Stress: No Stress Concern Present (03/10/2023)   [employer] of Occupational Health - Occupational Stress Questionnaire    Feeling of Stress : Only a little  Social Connections: Moderately Integrated (03/10/2023)   Social Connection and Isolation Panel [NHANES]    Frequency of Communication with Friends and Family: Three times a week    Frequency of Social Gatherings with Friends and Family: Twice a week    Attends Religious Services: More than 4 times per year    Active Member of [employer] or Organizations: No    Attends Engineer, Structural: Never    Marital Status: Married    Tobacco Counseling Counseling given: Not Answered   Clinical Intake:  Pre-visit preparation completed: Yes  Pain : 0-10 Pain Score: 3  (3 standing and 8 sitting) Pain Type: Chronic pain Pain Location: Scrotum Pain Descriptors / Indicators: Discomfort Pain Onset: More than a month ago Pain Frequency: Constant     Nutritional Risks: None Diabetes: No  How often do you need to have someone help you when you read instructions, pamphlets, or other written materials from your doctor or pharmacy?: 1 - Never  Interpreter Needed?: No  Information entered by :: NAllen LPN   Activities of  Daily Living    03/10/2023   10:49 AM  In your present state of health, do you have any difficulty performing the following activities:  Hearing? 1  Comment has hearing aids  Vision? 0  Difficulty concentrating or making decisions? 1  Comment some memory at times  Walking or climbing stairs? 0  Dressing or bathing? 0  Doing errands, shopping? 0  Preparing Food and eating ? N  Using the Toilet? N  In the past six months, have you accidently leaked urine? N  Do you have problems with loss of bowel control? Y  Managing your Medications? N  Managing your Finances? N  Housekeeping or managing your Housekeeping? N    Patient Care Team: Chandra Toribio POUR, MD as PCP - General (Family Medicine) Okey Vina GAILS, MD as PCP - Cardiology (Cardiology) Tat, Asberry RAMAN, DO as Consulting Physician (Neurology)  Indicate any recent Medical Services you may have received from other than [facility] providers in the past year (date may be approximate).     Assessment:   This is a routine wellness examination for Asaad.  Hearing/Vision screen Hearing Screening - Comments:: Has hearing aids that are maintained Vision Screening - Comments:: Regular eye exams, thinking about changing   Goals Addressed             This Visit's Progress    Patient Stated       03/10/2023, wants to lose 5 pounds and increase exercise, build up walking       Depression Screen    03/10/2023   11:03 AM 02/02/2023   11:09 AM 12/01/2022   11:08 AM 11/05/2022    4:14 PM 09/29/2022    2:40 PM 05/20/2022    9:41 AM 07/07/2019   11:15 AM  PHQ 2/9 Scores  PHQ - 2 Score 0 0 0 0 0 0 1  PHQ- 9 Score 1 2 3 2        Fall Risk    03/10/2023   11:01 AM 11/26/2022   11:04 AM 10/23/2022    2:32 PM 05/20/2022    9:38 AM 02/25/2022    1:04 PM  Fall Risk   Falls in the  past year? 0 0 0 0 0  Number falls in past yr: 0  0 0 0  Injury with Fall? 0  0  0  Risk for fall due to : Medication side effect   No Fall Risks   Follow up Falls  prevention discussed;Falls evaluation completed  Falls evaluation completed Falls evaluation completed     MEDICARE RISK AT HOME: Medicare Risk at Home Any stairs in or around the home?: Yes If so, are there any without handrails?: No Home free of loose throw rugs in walkways, pet beds, electrical cords, etc?: Yes Adequate lighting in your home to reduce risk of falls?: Yes Life alert?: No Use of a cane, walker or w/c?: No Grab bars in the bathroom?: No Shower chair or bench in shower?: Yes Elevated toilet seat or a handicapped toilet?: Yes  TIMED UP AND GO:  Was the test performed?  No    Cognitive Function:        03/10/2023   11:05 AM  6CIT Screen  What Year? 0 points  What month? 0 points  What time? 0 points  Count back from 20 0 points  Months in reverse 0 points  Repeat phrase 0 points  Total Score 0 points    Immunizations Immunization History  Administered Date(s) Administered   Fluad Quad(high Dose 65+) 12/05/2022   Influenza Split 12/05/2009, 12/13/2010   Influenza, High Dose Seasonal PF 10/14/2012, 10/26/2014, 12/25/2015, 10/24/2016, 12/15/2017, 11/16/2018   Influenza-Unspecified 10/20/2013, 11/25/2019   Moderna Sars-Covid-2 Vaccination 03/25/2019, 04/22/2019, 12/19/2019   PNEUMOCOCCAL CONJUGATE-20 03/07/2022   Pfizer(Comirnaty)Fall Seasonal Vaccine 12 years and older 12/05/2022   Pneumococcal Conjugate-13 10/20/2013   Pneumococcal Polysaccharide-23 10/14/2012   Tdap 04/13/2012, 12/13/2019   Tetanus 02/25/2019   Zoster Recombinant(Shingrix) 01/11/2018, 04/28/2018   Zoster, Live 11/15/2012, 01/11/2018, 04/28/2018   Zoster, Unspecified 02/24/2018    TDAP status: Up to date  Flu Vaccine status: Up to date  Pneumococcal vaccine status: Up to date  Covid-19 vaccine status: Completed vaccines  Qualifies for Shingles Vaccine? Yes   Zostavax completed Yes   Shingrix Completed?: Yes  Screening Tests Health Maintenance  Topic Date Due   Hepatitis  C Screening  Never done   COVID-19 Vaccine (5 - 2024-25 season) 01/30/2023   Medicare Annual Wellness (AWV)  03/09/2024   DTaP/Tdap/Td (4 - Td or Tdap) 12/12/2029   Pneumonia Vaccine 39+ Years old  Completed   INFLUENZA VACCINE  Completed   Zoster Vaccines- Shingrix  Completed   HPV VACCINES  Aged Out   Colonoscopy  Discontinued    Health Maintenance  Health Maintenance Due  Topic Date Due   Hepatitis C Screening  Never done   COVID-19 Vaccine (5 - 2024-25 season) 01/30/2023    Colorectal cancer screening: No longer required.   Lung Cancer Screening: (Low Dose CT Chest recommended if Age 7-80 years, 20 pack-year currently smoking OR have quit w/in 15years.) does not qualify.   Lung Cancer Screening Referral: no  Additional Screening:  Hepatitis C Screening: does qualify;   Vision Screening: Recommended annual ophthalmology exams for early detection of glaucoma and other disorders of the eye. Is the patient up to date with their annual eye exam?  Yes  Who is the provider or what is the name of the office in which the patient attends annual eye exams? Looking for new eye doctor If pt is not established with a provider, would they like to be referred to a provider to establish care? No .  Dental Screening: Recommended annual dental exams for proper oral hygiene  Diabetic Foot Exam: n/a  Community Resource Referral / Chronic Care Management: CRR required this visit?  No   CCM required this visit?  No     Plan:     I have personally reviewed and noted the following in the patient's chart:   Medical and social history Use of alcohol, tobacco or illicit drugs  Current medications and supplements including opioid prescriptions. Patient is not currently taking opioid prescriptions. Functional ability and status Nutritional status Physical activity Advanced directives List of other physicians Hospitalizations, surgeries, and ER visits in previous 12  months Vitals Screenings to include cognitive, depression, and falls Referrals and appointments  In addition, I have reviewed and discussed with patient certain preventive protocols, quality metrics, and best practice recommendations. A written personalized care plan for preventive services as well as general preventive health recommendations were provided to patient.     Ardella FORBES Dawn, LPN   [id number]   After Visit Summary: (MyChart) Due to this being a telephonic visit, the after visit summary with patients personalized plan was offered to patient via MyChart   Nurse Notes: none

## 2023-03-19 ENCOUNTER — Telehealth: Payer: Self-pay | Admitting: *Deleted

## 2023-03-19 NOTE — Telephone Encounter (Signed)
Received vm message from patient this am inquiring about having a CT scan prior to his next appt with Dr. Leonides Schanz or Georga Kaufmann, PA on 04/03/23 Discussed this with Karena Addison and she states that she will discuss this with Dr. Leonides Schanz with he returns to the office next week, but in the meantime we will check pt's labs and then she and Dr. Leonides Schanz will determine if he needs another scan or not. Pt is agreeable to this plan. He did say that he has a rarer type of prostate cancer that can spread but not necessarily increase his PSA. But he is fine with discussing this with at his phone visit with Georga Kaufmann, PA on 04/03/23

## 2023-03-20 DIAGNOSIS — H0012 Chalazion right lower eyelid: Secondary | ICD-10-CM | POA: Diagnosis not present

## 2023-03-26 ENCOUNTER — Other Ambulatory Visit: Payer: Self-pay | Admitting: Physician Assistant

## 2023-03-26 ENCOUNTER — Inpatient Hospital Stay: Payer: Medicare Other | Attending: Hematology and Oncology

## 2023-03-26 DIAGNOSIS — Z79899 Other long term (current) drug therapy: Secondary | ICD-10-CM | POA: Diagnosis not present

## 2023-03-26 DIAGNOSIS — C61 Malignant neoplasm of prostate: Secondary | ICD-10-CM | POA: Insufficient documentation

## 2023-03-26 LAB — CBC WITH DIFFERENTIAL (CANCER CENTER ONLY)
Abs Immature Granulocytes: 0.01 10*3/uL (ref 0.00–0.07)
Basophils Absolute: 0.1 10*3/uL (ref 0.0–0.1)
Basophils Relative: 1 %
Eosinophils Absolute: 0.2 10*3/uL (ref 0.0–0.5)
Eosinophils Relative: 3 %
HCT: 37 % — ABNORMAL LOW (ref 39.0–52.0)
Hemoglobin: 12.4 g/dL — ABNORMAL LOW (ref 13.0–17.0)
Immature Granulocytes: 0 %
Lymphocytes Relative: 23 %
Lymphs Abs: 1.2 10*3/uL (ref 0.7–4.0)
MCH: 29.7 pg (ref 26.0–34.0)
MCHC: 33.5 g/dL (ref 30.0–36.0)
MCV: 88.7 fL (ref 80.0–100.0)
Monocytes Absolute: 0.5 10*3/uL (ref 0.1–1.0)
Monocytes Relative: 10 %
Neutro Abs: 3.4 10*3/uL (ref 1.7–7.7)
Neutrophils Relative %: 63 %
Platelet Count: 155 10*3/uL (ref 150–400)
RBC: 4.17 MIL/uL — ABNORMAL LOW (ref 4.22–5.81)
RDW: 13.1 % (ref 11.5–15.5)
WBC Count: 5.4 10*3/uL (ref 4.0–10.5)
nRBC: 0 % (ref 0.0–0.2)

## 2023-03-26 LAB — CMP (CANCER CENTER ONLY)
ALT: 13 U/L (ref 0–44)
AST: 22 U/L (ref 15–41)
Albumin: 4.2 g/dL (ref 3.5–5.0)
Alkaline Phosphatase: 50 U/L (ref 38–126)
Anion gap: 4 — ABNORMAL LOW (ref 5–15)
BUN: 23 mg/dL (ref 8–23)
CO2: 29 mmol/L (ref 22–32)
Calcium: 9.5 mg/dL (ref 8.9–10.3)
Chloride: 107 mmol/L (ref 98–111)
Creatinine: 0.89 mg/dL (ref 0.61–1.24)
GFR, Estimated: >60 mL/min (ref >60)
Glucose, Bld: 86 mg/dL (ref 70–99)
Potassium: 4.1 mmol/L (ref 3.5–5.1)
Sodium: 140 mmol/L (ref 135–145)
Total Bilirubin: 0.4 mg/dL (ref 0.0–1.2)
Total Protein: 6.7 g/dL (ref 6.5–8.1)

## 2023-03-27 LAB — PROSTATE-SPECIFIC AG, SERUM (LABCORP): Prostate Specific Ag, Serum: <0.1 ng/mL (ref 0.0–4.0)

## 2023-03-27 LAB — TESTOSTERONE: Testosterone: 74 ng/dL — ABNORMAL LOW (ref 264–916)

## 2023-04-02 ENCOUNTER — Telehealth: Payer: Medicare Other | Admitting: Hematology and Oncology

## 2023-04-03 ENCOUNTER — Inpatient Hospital Stay: Payer: Medicare Other | Attending: Hematology and Oncology | Admitting: Physician Assistant

## 2023-04-03 DIAGNOSIS — C61 Malignant neoplasm of prostate: Secondary | ICD-10-CM

## 2023-04-03 NOTE — Progress Notes (Signed)
 [facility] [phone]   [phone]  PROGRESS NOTE  I connected with [name]  on 04/03/23 by telephone visit and verified that I am speaking with the correct person using two identifiers.   I discussed the limitations, risks, security and privacy concerns of performing an evaluation and management service by telemedicine and the availability of in-person appointments. I also discussed with the patient that there may be a patient responsible charge related to this service. The patient expressed understanding and agreed to proceed.  Patient's location: Home Provider's location: Office   Patient Care Team: Chandra Toribio POUR, MD as PCP - General (Family Medicine) Okey Vina GAILS, MD as PCP - Cardiology (Cardiology) Tat, Asberry RAMAN, DO as Consulting Physician (Neurology)  Hematological/Oncological History # Prostate Cancer T2N0 Gleason 4+3=7 12/2010: underwent a radical prostatectomy and lymph node dissection. 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 He had one out of 12 lymph nodes involved with cancer predominantly in the right pelvic wall.  04/2011: started on Lupron  due to PSA rise from 0.65 to 0.89 04/2015: PSA went up to 0.3. Underwent salvage radiation therapy. Intermittent ADT started.  09/26/2021: last visit with Dr. Amadeo.  04/07/2022: establish care with Dr. Federico   Interval History:  [name] 78 y.o. male with medical history significant for early stage prostate cancer status post radical prior septectomy and subsequent salvage radiation therapy who presents for a follow up visit. The patient's last visit was on 03/27/2022 with Dr. Federico. In the interim since the last visit he has had no major changes in his health.  On exam today [name] reports that he feels overall stable.  He does have chronic fatigue that is unchanged. He has a good appetite.He denies nausea,  vomiting or abdominal pain. He did have some issues with constipation that has improved with supportive medications. She denies easy bruising or signs of bleeding. He does have some urinary incontinence. He denies fevers, chills, sweats, shortness of breath, chest pain or cough. He has no other complaints. Rest of the ROS is below.    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   Hyperlipidemia    Hypertension    Prostate cancer (HCC)    Recurrent upper respiratory infection (URI)    pt had a cold recent- week ago - better now    Tremor     SURGICAL HISTORY: Past Surgical History:  Procedure Laterality Date   CATARACT EXTRACTION Bilateral    LUMBAR LAMINECTOMY/DECOMPRESSION MICRODISCECTOMY Left 04/01/2018   Procedure: Left Lumbar four-lumbar five Microdiscectomy;  Surgeon: Unice Pac, MD;  Location: [facility] OR;  Service: Neurosurgery;  Laterality: Left;   OTHER SURGICAL HISTORY  2005   right hand middle finger surgery    REPAIR OF CEREBROSPINAL FLUID LEAK N/A 05/18/2018   Procedure: Exploration of Lumbar four-five with repair of cerebrospinal fluid leak;  Surgeon: Unice Pac, MD;  Location: [facility] OR;  Service: Neurosurgery;  Laterality: N/A;   ROBOT ASSISTED LAPAROSCOPIC RADICAL PROSTATECTOMY  01/13/2011   Procedure: ROBOTIC ASSISTED LAPAROSCOPIC RADICAL PROSTATECTOMY LEVEL 2;  Surgeon: Noretta Ferrara, MD;  Location: [facility] ORS;  Service: Urology;  Laterality: Bilateral;  Robotic Assisted Laparoscopic Prostatetectomy with Bilateral Lymphadenectomy  Level 2    SOCIAL HISTORY: Social History   Socioeconomic History   Marital status:  Married    Spouse name: Not on file   Number of children: 2   Years of education: Bachelors   Highest education level: Bachelor's degree (e.g., BA, AB, BS)  Occupational History   Occupation: Retired    Comment: art gallery manager  Tobacco Use   Smoking status: Never    Smokeless tobacco: Never  Vaping Use   Vaping status: Never Used  Substance and Sexual Activity   Alcohol use: Not Currently    Comment: 1 drink per day or less   Drug use: No   Sexual activity: Never  Other Topics Concern   Not on file  Social History Narrative   Lives at home with his wife.   Right-handed.   No caffeine use.   Social Drivers of Corporate Investment Banker Strain: Low Risk  (03/10/2023)   Overall Financial Resource Strain (CARDIA)    Difficulty of Paying Living Expenses: Not hard at all  Food Insecurity: No Food Insecurity (03/10/2023)   Hunger Vital Sign    Worried About Running Out of Food in the Last Year: Never true    Ran Out of Food in the Last Year: Never true  Transportation Needs: No Transportation Needs (03/10/2023)   PRAPARE - Administrator, Civil Service (Medical): No    Lack of Transportation (Non-Medical): No  Physical Activity: Insufficiently Active (03/10/2023)   Exercise Vital Sign    Days of Exercise per Week: 7 days    Minutes of Exercise per Session: 20 min  Stress: No Stress Concern Present (03/10/2023)   [employer] of Occupational Health - Occupational Stress Questionnaire    Feeling of Stress : Only a little  Social Connections: Moderately Integrated (03/10/2023)   Social Connection and Isolation Panel [NHANES]    Frequency of Communication with Friends and Family: Three times a week    Frequency of Social Gatherings with Friends and Family: Twice a week    Attends Religious Services: More than 4 times per year    Active Member of [employer] or Organizations: No    Attends Banker Meetings: Never    Marital Status: Married  Catering Manager Violence: Not At Risk (03/10/2023)   Humiliation, Afraid, Rape, and Kick questionnaire    Fear of Current or Ex-Partner: No    Emotionally Abused: No    Physically Abused: No    Sexually Abused: No    FAMILY HISTORY: Family History  Problem Relation Age of Onset    Heart attack Father    Hypertension Father    Cancer Mother        cervical with mets to lung   Hypertension Sister    Healthy Child     ALLERGIES:  is allergic to imipramine, lisinopril -hydrochlorothiazide , meloxicam, and tape.  MEDICATIONS:  Current Outpatient Medications  Medication Sig Dispense Refill   acetaminophen  (TYLENOL ) 325 MG tablet Take 325 mg by mouth every 6 (six) hours as needed for mild pain, moderate pain, fever or headache.     amLODipine  (NORVASC ) 2.5 MG tablet Take 1 tablet (2.5 mg total) by mouth daily. 90 tablet 3   ascorbic acid  (VITAMIN C ) 1000 MG tablet Take 800 mg by mouth daily. Pt takes 5 times a week     b complex vitamins capsule Take 1 capsule by mouth daily.     Cholecalciferol  (VITAMIN D -3) 25 MCG (1000 UT) CAPS Take by mouth.     LORazepam  (ATIVAN ) 0.5 MG tablet Take 1 tablet (0.5 mg total) by mouth as  directed. For Anxiety 15 tablet 0   losartan  (COZAAR ) 100 MG tablet Take 1 tablet (100 mg total) by mouth daily. 90 tablet 3   magnesium  (MAGTAB) 84 MG ( ) TBCR SR tablet Take 84 mg by mouth.     Magnesium  Oxide -Mg Supplement 200 MG TABS TAKE 1 TABLET BY MOUTH IN THE MORNING AND AT BEDTIME 60 tablet 0   pregabalin (LYRICA) 150 MG capsule Take 150 mg by mouth in the morning, at noon, and at bedtime.     primidone  (MYSOLINE ) 250 MG tablet Take 1 tablet (250 mg total) by mouth in the morning. 90 tablet 1   primidone  (MYSOLINE ) 50 MG tablet Pt. Takes 5 tablets 100 mg in the AM, and 1 tablet 50 mg in the PM 540 tablet 0   psyllium (REGULOID) 0.52 g capsule Take 0.52 g by mouth daily. 2 capsules by mouth twice daily     rosuvastatin  (CRESTOR ) 10 MG tablet Take 1 tablet (10 mg total) by mouth daily. 90 tablet 3   vitamin B-12 (CYANOCOBALAMIN ) 500 MCG tablet Take 500 mcg by mouth daily. (Patient not taking: Reported on 03/10/2023)     zolpidem  (AMBIEN  CR) 12.5 MG CR tablet Take by mouth at bedtime. 1/2 to 1 tablet daily at bedtime     No current  facility-administered medications for this visit.    REVIEW OF SYSTEMS:   Constitutional: ( - ) fevers, ( - )  chills , ( - ) night sweats Eyes: ( - ) blurriness of vision, ( - ) double vision, ( - ) watery eyes Ears, nose, mouth, throat, and face: ( - ) mucositis, ( - ) sore throat Respiratory: ( - ) cough, ( - ) dyspnea, ( - ) wheezes Cardiovascular: ( - ) palpitation, ( - ) chest discomfort, ( - ) lower extremity swelling Gastrointestinal:  ( - ) nausea, ( - ) heartburn, ( - ) change in bowel habits Skin: ( - ) abnormal skin rashes Lymphatics: ( - ) new lymphadenopathy, ( - ) easy bruising Neurological: ( - ) numbness, ( - ) tingling, ( - ) new weaknesses Behavioral/Psych: ( - ) mood change, ( - ) new changes  All other systems were reviewed with the patient and are negative.  PHYSICAL EXAMINATION: Not performed due to virtual visit.   LABORATORY DATA:  I have reviewed the data as listed    Latest Ref Rng & Units 03/26/2023   10:23 AM 09/24/2022   11:14 AM 07/17/2022   11:01 AM  CBC  WBC 4.0 - 10.5 K/uL 5.4  4.0  5.7   Hemoglobin 13.0 - 17.0 g/dL 87.5  87.5  87.0   Hematocrit 39.0 - 52.0 % 37.0  36.8  39.5   Platelets 150 - 400 K/uL 155  178  190.0        Latest Ref Rng & Units 03/26/2023   10:23 AM 09/24/2022   11:14 AM 05/08/2022   11:58 AM  CMP  Glucose 70 - 99 mg/dL 86  884  87   BUN 8 - 23 mg/dL 23  20  19    Creatinine 0.61 - 1.24 mg/dL 9.10  9.14  9.09   Sodium 135 - 145 mmol/L 140  137  141   Potassium 3.5 - 5.1 mmol/L 4.1  4.0  4.3   Chloride 98 - 111 mmol/L 107  105  104   CO2 22 - 32 mmol/L 29  28  23    Calcium  8.9 - 10.3 mg/dL 9.5  9.7  9.4   Total Protein 6.5 - 8.1 g/dL 6.7  6.8    Total Bilirubin 0.0 - 1.2 mg/dL 0.4  0.4    Alkaline Phos 38 - 126 U/L 50  54    AST 15 - 41 U/L 22  17    ALT 0 - 44 U/L 13  12      RADIOGRAPHIC STUDIES: No results found.  ASSESSMENT & PLAN MARKEL KURTENBACH is a 78 y.o.  male with medical history significant for early  stage prostate cancer status post radical prior septectomy and subsequent salvage radiation therapy who presents for a follow up visit.   # Prostate Cancer T2N0 Gleason 4+3=7 --Labs from 03/26/2023 reviewed. WBC 5.4, Hgb 12.4, Plt 155, Creatinine and LFTs normal. PSA <0.1.  --No clinical signs of recurrence.  -- Most recent CT scan on 03/21/2022 showed no evidence of residual or recurrent disease. Patient prefers to continue with annual CT imaging due to low PSA level at initial diagnosis and with history of neuroendocrine features seen in pathology.  --Labs in 6 months and labs/follow up in 12 months.  Orders Placed This Encounter  Procedures   CT CHEST ABDOMEN PELVIS W CONTRAST    Standing Status:   Future    Expected Date:   04/17/2023    Expiration Date:   04/02/2024    If indicated for the ordered procedure, I authorize the administration of contrast media per Radiology protocol:   Yes    Does the patient have a contrast media/X-ray dye allergy?:   No    Preferred imaging location?:   [facility]    If indicated for the ordered procedure, I authorize the administration of oral contrast media per Radiology protocol:   Yes    All questions were answered. The patient knows to call the clinic with any problems, questions or concerns.  A total of more than 30 minutes were spent on this encounter with non-face-to-face time, including preparing to see the patient, ordering tests and/or medications, counseling the patient and coordination of care as outlined above.   Johnston Police PA-C Dept of Hematology and Oncology [facility] Phone: [phone]   04/03/2023 4:58 PM

## 2023-04-17 ENCOUNTER — Ambulatory Visit (HOSPITAL_COMMUNITY)
Admission: RE | Admit: 2023-04-17 | Discharge: 2023-04-17 | Disposition: A | Discharge status: Home / Self Care | Payer: Medicare Other | Source: Ambulatory Visit | Attending: Physician Assistant | Admitting: Physician Assistant

## 2023-04-17 DIAGNOSIS — C61 Malignant neoplasm of prostate: Secondary | ICD-10-CM | POA: Insufficient documentation

## 2023-04-17 DIAGNOSIS — I700 Atherosclerosis of aorta: Secondary | ICD-10-CM | POA: Diagnosis not present

## 2023-04-17 DIAGNOSIS — N281 Cyst of kidney, acquired: Secondary | ICD-10-CM | POA: Diagnosis not present

## 2023-04-17 MED ORDER — IOHEXOL 300 MG/ML  SOLN
100.0000 mL | Freq: Once | INTRAMUSCULAR | Status: AC | PRN
  Administered 2023-04-17: 100 mL via INTRAVENOUS

## 2023-04-20 ENCOUNTER — Telehealth: Payer: Self-pay

## 2023-04-20 NOTE — Telephone Encounter (Signed)
-----   Message from Briant Cedar sent at 04/20/2023 10:10 AM EST ----- Please notify patient that CT scan shows no evidence of recurrence or metastatic disease. ----- Message ----- From: Interface, Rad Results In Sent: 04/17/2023   2:49 PM EST To: Briant Cedar, PA-C

## 2023-04-20 NOTE — Telephone Encounter (Signed)
 Pt advised with verbal understanding

## 2023-04-22 DIAGNOSIS — H43393 Other vitreous opacities, bilateral: Secondary | ICD-10-CM | POA: Diagnosis not present

## 2023-04-27 NOTE — Progress Notes (Unsigned)
 Assessment/Plan:    1.  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  I told him he had room to increase the bedtime dose.  He actually told me he thought he was going to back down on the medication in the morning to see if it made any difference.  He is currently on primidone, 250 mg in the morning and 50 mg at bed.  He will let me know how he does, but I told him to make sure that he does not stop this medication cold [place].  -Patient knows he has very few options short of DBS surgery or focused ultrasound and he has traditionally not been interested in that.  He does asked me some details about that today and we discussed that.  After some discussion, he is going to think about it, but does not want a referral right now.  -Patient asks what kind of medication options he still has, but really they are quite limited.  He is bradycardic, so beta-blockers are really not an option.  He is already on Lyrica.  Anticholinergics are probably not an option because of risk of falls, confusion, hallucinations in this age group.   2.  Spasmodic dysphonia  -Discussed nature and pathophysiology.  Discussed role of Botox.  Wants to hold on ENT referral  -He has seen and done speech therapy for hypophonia and for spasmodic dysphonia.  -Has seen ENT with Dr. Irene Pap, who noted vocal atrophy and also recommended Botox. 3.  Severe scrotal/perineal pain  -Developed after a surgery for an artificial urinary sphincter.  Patient is following with urology.  Had hypogastric nerve ablation done by interventional radiology but it made it worse and now following with Dr. Lorrine Kin and pelvic floor physical therapy.  Is now on Lyrica, and the patient does not particularly love the medication.  He asks if there are alternative homeopathic remedies, but I told him this would really be out of my field of expertise.  4..  B12 deficiency  -pt with evidence of B12 deficiency. He started a supplement in  May after showing low b12 but he needs to increase the dose to  5..  Memory change  -Strongly doubt neurodegenerative  -Has neurocognitive testing upcoming in a few weeks  Subjective:   [name] was seen today in follow up for essential tremor.  My previous records were reviewed prior to todays visit.  Wife present today and supplements history.  Patient last seen in January.  He is currently on primidone, 250 mg in the morning and 50 mg at bedtime.  This is just a slight increase.  We did refer him for speech therapy after our last visit due to hypophonia.  He subsequently also saw ENT, who noted bilateral vocal fold atrophy.  She also talk to the patient about recommending a trial of Botox, which I have talked to him about in the past as well.  He decided to hold off on that.  Tremor is still getting worse.  Tremors are interfering with writing, eating but not drinking.  Its better in the AM but deteriorates in the day.    Current prescribed movement disorder medications: Primidone, 250 mg in the morning and 50 mg at bedtime Ativan 0.5 mg, half tablet daily  Prior medications: Primidone, 250 mg   ALLERGIES:   Allergies  Allergen Reactions   Imipramine Other (See Comments)   Lisinopril-Hydrochlorothiazide     Other reaction(s): cough?   Meloxicam  Other reaction(s): rash?... Able to tolerate Aleve without problems   Tape Other (See Comments) and Rash    Surgical tape=redness/rash at site    CURRENT MEDICATIONS:  Outpatient Encounter Medications as of 04/28/2023  Medication Sig   acetaminophen (TYLENOL) 325 MG tablet Take 325 mg by mouth every 6 (six) hours as needed for mild pain, moderate pain, fever or headache.   amLODipine (NORVASC) 2.5 MG tablet Take 1 tablet (2.5 mg total) by mouth daily.   ascorbic acid (VITAMIN C) 1000 MG tablet Take 800 mg by mouth daily. Pt takes 5 times a week   b complex vitamins capsule Take 1 capsule by mouth daily.   LORazepam (ATIVAN)  0.5 MG tablet Take 1 tablet (0.5 mg total) by mouth as directed. For Anxiety   losartan (COZAAR) 100 MG tablet Take 1 tablet (100 mg total) by mouth daily.   magnesium (MAGTAB) 84 MG ( ) TBCR SR tablet Take 84 mg by mouth.   pregabalin (LYRICA) 150 MG capsule Take 150 mg by mouth in the morning, at noon, and at bedtime.   primidone (MYSOLINE) 250 MG tablet Take 1 tablet (250 mg total) by mouth in the morning.   primidone (MYSOLINE) 50 MG tablet Pt. Takes 5 tablets 100 mg in the AM, and 1 tablet 50 mg in the PM   psyllium (REGULOID) 0.52 g capsule Take 0.52 g by mouth daily. 2 capsules by mouth twice daily   rosuvastatin (CRESTOR) 10 MG tablet Take 1 tablet (10 mg total) by mouth daily.   vitamin B-12 (CYANOCOBALAMIN) 500 MCG tablet Take 500 mcg by mouth daily.   zolpidem (AMBIEN CR) 12.5 MG CR tablet Take by mouth at bedtime. 1/2 to 1 tablet daily at bedtime   Cholecalciferol (VITAMIN D-3) 25 MCG (1000 UT) CAPS Take by mouth. (Patient not taking: Reported on 04/28/2023)   [DISCONTINUED] Magnesium Oxide -Mg Supplement 200 MG TABS TAKE 1 TABLET BY MOUTH IN THE MORNING AND AT BEDTIME   No facility-administered encounter medications on file as of 04/28/2023.     Objective:    PHYSICAL EXAMINATION:    VITALS:   Vitals:   04/28/23 1354  BP: 124/62  Pulse: (!) 55  SpO2: 97%  Weight: 182 lb 9.6 oz (82.8 kg)  Height: 5\' 10"  (1.778 m)    GEN:  The patient appears stated age and is in NAD. HEENT:  Normocephalic, atraumatic.  The mucous membranes are moist. The superficial temporal arteries are without ropiness or tenderness. CV:  brady.  regular Lungs:  CTAB Neck/HEME:  There are no carotid bruits bilaterally.  Neurological examination:  Orientation: The patient is alert and oriented x3.  He asked detailed and appropriate questions. Cranial nerves: There is good facial symmetry. The speech is fluent, but not particularly hypophonic with evidence of spasmodic dysphonia.. Soft palate rises  symmetrically and there is no tongue deviation. Hearing is intact to conversational tone. Sensation: Sensation is intact to light touch throughout Motor: Strength is at least antigravity x4.  Movement examination: Tone: There is normal tone in the UE/LE Abnormal movements: There is just mild tremor of the outstretched hands and with intention.  He has trouble with Archimedes spirals, especially trying to get the hand to the paper.  The left is worse than the right.  This has been true the last many visits.      I have reviewed and interpreted the following labs independently   Chemistry      Component Value Date/Time   NA 140  03/26/2023 1023   NA 141 05/08/2022 1158   NA 141 01/06/2017 0820   K 4.1 03/26/2023 1023   K 4.2 01/06/2017 0820   CL 107 03/26/2023 1023   CO2 29 03/26/2023 1023   CO2 26 01/06/2017 0820   BUN 23 03/26/2023 1023   BUN 19 05/08/2022 1158   BUN 28.9 (H) 01/06/2017 0820   CREATININE 0.89 03/26/2023 1023   CREATININE 1.1 01/06/2017 0820      Component Value Date/Time   CALCIUM 9.5 03/26/2023 1023   CALCIUM 9.7 01/06/2017 0820   ALKPHOS 50 03/26/2023 1023   ALKPHOS 41 01/06/2017 0820   AST 22 03/26/2023 1023   AST 19 01/06/2017 0820   ALT 13 03/26/2023 1023   ALT 16 01/06/2017 0820   BILITOT 0.4 03/26/2023 1023   BILITOT 0.43 01/06/2017 0820      Lab Results  Component Value Date   WBC 5.4 03/26/2023   HGB 12.4 (L) 03/26/2023   HCT 37.0 (L) 03/26/2023   MCV 88.7 03/26/2023   PLT 155 03/26/2023   Lab Results  Component Value Date   TSH 3.05 10/23/2022     Chemistry      Component Value Date/Time   NA 140 03/26/2023 1023   NA 141 05/08/2022 1158   NA 141 01/06/2017 0820   K 4.1 03/26/2023 1023   K 4.2 01/06/2017 0820   CL 107 03/26/2023 1023   CO2 29 03/26/2023 1023   CO2 26 01/06/2017 0820   BUN 23 03/26/2023 1023   BUN 19 05/08/2022 1158   BUN 28.9 (H) 01/06/2017 0820   CREATININE 0.89 03/26/2023 1023   CREATININE 1.1  01/06/2017 0820      Component Value Date/Time   CALCIUM 9.5 03/26/2023 1023   CALCIUM 9.7 01/06/2017 0820   ALKPHOS 50 03/26/2023 1023   ALKPHOS 41 01/06/2017 0820   AST 22 03/26/2023 1023   AST 19 01/06/2017 0820   ALT 13 03/26/2023 1023   ALT 16 01/06/2017 0820   BILITOT 0.4 03/26/2023 1023   BILITOT 0.43 01/06/2017 0820     Lab Results  Component Value Date   VITAMINB12 254 07/17/2022   Lab Results  Component Value Date   TSH 3.05 10/23/2022    Total time spent on today's visit was 42 minutes, including both face-to-face time and nonface-to-face time.  Time included that spent on review of records (prior notes available to me/labs/imaging if pertinent), discussing treatment and goals, answering patient's questions and coordinating care.     Cc:  Sandre Kitty, MD

## 2023-04-28 ENCOUNTER — Encounter: Payer: Self-pay | Admitting: Neurology

## 2023-04-28 ENCOUNTER — Ambulatory Visit (INDEPENDENT_AMBULATORY_CARE_PROVIDER_SITE_OTHER): Payer: Medicare Other | Admitting: Neurology

## 2023-04-28 VITALS — BP 124/62 | HR 55 | Ht 70.0 in | Wt 182.6 lb

## 2023-04-28 DIAGNOSIS — R413 Other amnesia: Secondary | ICD-10-CM | POA: Diagnosis not present

## 2023-04-28 DIAGNOSIS — G250 Essential tremor: Secondary | ICD-10-CM

## 2023-04-28 NOTE — Patient Instructions (Signed)
 Let me know what happens if you decrease your medication.  Let me know if you need a referral to [facility]  The physicians and staff at [facility] are committed to providing excellent care. You may receive a survey requesting feedback about your experience at our office. We strive to receive "very good" responses to the survey questions. If you feel that your experience would prevent you from giving the office a "very good " response, please contact our office to try to remedy the situation. We may be reached at [phone]. Thank you for taking the time out of your busy day to complete the survey.

## 2023-05-03 NOTE — Progress Notes (Unsigned)
   Established Patient Office Visit  Subjective   Patient ID: [name], male    DOB: 1945/12/06  Age: 78 y.o. MRN: [id number]  No chief complaint on file.   HPI  ESSent tremor -   Vocal dysphonia - [facility] - recommends botox.    Urinary pain - dr. Lorrine Kin. Nothing available at [facility].     The ASCVD Risk score (Arnett DK, et al., 2019) failed to calculate for the following reasons:   Cannot find a previous HDL lab   Cannot find a previous total cholesterol lab  Health Maintenance Due  Topic Date Due   Hepatitis C Screening  Never done   COVID-19 Vaccine (5 - 2024-25 season) 01/30/2023      Objective:     There were no vitals taken for this visit. {Vitals History (Optional):23777}  Physical Exam   No results found for any visits on 05/04/23.      Assessment & Plan:   There are no diagnoses linked to this encounter.   No follow-ups on file.    Sandre Kitty, MD

## 2023-05-04 ENCOUNTER — Encounter: Payer: Self-pay | Admitting: Family Medicine

## 2023-05-04 ENCOUNTER — Ambulatory Visit (INDEPENDENT_AMBULATORY_CARE_PROVIDER_SITE_OTHER): Payer: Medicare Other | Admitting: Family Medicine

## 2023-05-04 VITALS — BP 121/69 | HR 60 | Ht 70.0 in | Wt 182.4 lb

## 2023-05-04 DIAGNOSIS — Z131 Encounter for screening for diabetes mellitus: Secondary | ICD-10-CM

## 2023-05-04 DIAGNOSIS — D649 Anemia, unspecified: Secondary | ICD-10-CM

## 2023-05-04 DIAGNOSIS — I10 Essential (primary) hypertension: Secondary | ICD-10-CM

## 2023-05-04 DIAGNOSIS — E785 Hyperlipidemia, unspecified: Secondary | ICD-10-CM | POA: Diagnosis not present

## 2023-05-04 DIAGNOSIS — G588 Other specified mononeuropathies: Secondary | ICD-10-CM | POA: Diagnosis not present

## 2023-05-04 DIAGNOSIS — G250 Essential tremor: Secondary | ICD-10-CM | POA: Diagnosis not present

## 2023-05-04 DIAGNOSIS — M79645 Pain in left finger(s): Secondary | ICD-10-CM

## 2023-05-04 MED ORDER — LORAZEPAM 0.5 MG PO TABS: 0.5000 mg | ORAL_TABLET | ORAL | 0 refills | Status: DC

## 2023-05-04 MED ORDER — BISACODYL 10 MG RE SUPP: 10.0000 mg | RECTAL | 2 refills | Status: DC | PRN

## 2023-05-04 NOTE — Patient Instructions (Signed)
 It was nice to see you today,  We addressed the following topics today: -I am going to order some blood tests for you I believe the results when I get them - No changes were made to your medication - The psyllium is just the Metamucil for your constipation.  I would not go more than 3 days without trying to treat the constipation with suppository or other medication - I would like you to talk to your orthopedist about the pain in your hand.  I think it is caused by something called Dupuytren's contracture which would require an injection or other procedure to treat.  Have a great day,  Frederic Jericho, MD

## 2023-05-04 NOTE — Assessment & Plan Note (Signed)
 Patient's symptoms seems most consistent with trigger finger but exam was largely normal.  Advised him to follow-up with his orthopedist at [facility] regarding further investigation and treatment of this.

## 2023-05-04 NOTE — Assessment & Plan Note (Signed)
 No changes to his primidone dosing.  Follows with neurology.

## 2023-05-04 NOTE — Assessment & Plan Note (Signed)
 I cannot think of any other treatment modalities we have not tried or at least investigated.  Pain is manageable with combination of lidocaine cream for his "superficial" pain and Lyrica for the "deeper" pain.

## 2023-05-05 ENCOUNTER — Encounter: Payer: Self-pay | Admitting: Family Medicine

## 2023-05-05 ENCOUNTER — Other Ambulatory Visit: Payer: Self-pay | Admitting: Neurology

## 2023-05-05 DIAGNOSIS — M65332 Trigger finger, left middle finger: Secondary | ICD-10-CM | POA: Diagnosis not present

## 2023-05-05 DIAGNOSIS — G250 Essential tremor: Secondary | ICD-10-CM

## 2023-05-05 DIAGNOSIS — M25512 Pain in left shoulder: Secondary | ICD-10-CM | POA: Diagnosis not present

## 2023-05-05 LAB — IRON,TIBC AND FERRITIN PANEL
Ferritin: 189 ng/mL (ref 30–400)
Iron Saturation: 38 % (ref 15–55)
Iron: 107 ug/dL (ref 38–169)
Total Iron Binding Capacity: 278 ug/dL (ref 250–450)
UIBC: 171 ug/dL (ref 111–343)

## 2023-05-05 LAB — LIPID PANEL
Chol/HDL Ratio: 3.4 ratio (ref 0.0–5.0)
Cholesterol, Total: 133 mg/dL (ref 100–199)
HDL: 39 mg/dL — ABNORMAL LOW (ref >39)
LDL Chol Calc (NIH): 66 mg/dL (ref 0–99)
Triglycerides: 167 mg/dL — ABNORMAL HIGH (ref 0–149)
VLDL Cholesterol Cal: 28 mg/dL (ref 5–40)

## 2023-05-05 LAB — HEMOGLOBIN A1C
Est. average glucose Bld gHb Est-mCnc: 114 mg/dL
Hgb A1c MFr Bld: 5.6 % (ref 4.8–5.6)

## 2023-05-11 DIAGNOSIS — M65332 Trigger finger, left middle finger: Secondary | ICD-10-CM | POA: Diagnosis not present

## 2023-05-12 ENCOUNTER — Other Ambulatory Visit: Payer: Self-pay | Admitting: Nurse Practitioner

## 2023-05-19 DIAGNOSIS — M65331 Trigger finger, right middle finger: Secondary | ICD-10-CM | POA: Diagnosis not present

## 2023-05-25 ENCOUNTER — Institutional Professional Consult (permissible substitution): Payer: Medicare Other | Admitting: Psychology

## 2023-05-25 ENCOUNTER — Ambulatory Visit: Payer: Self-pay

## 2023-06-01 ENCOUNTER — Encounter: Payer: Medicare Other | Admitting: Psychology

## 2023-06-05 ENCOUNTER — Institutional Professional Consult (permissible substitution): Payer: Medicare Other | Admitting: Psychology

## 2023-06-05 ENCOUNTER — Ambulatory Visit: Payer: Self-pay

## 2023-06-11 ENCOUNTER — Encounter: Payer: Medicare Other | Admitting: Psychology

## 2023-06-11 ENCOUNTER — Other Ambulatory Visit: Payer: Self-pay | Admitting: Family Medicine

## 2023-06-11 NOTE — Telephone Encounter (Signed)
 Copied from CRM [id number]. Topic: Clinical - Medication Refill >> Jun 11, 2023  9:42 AM Carlatta H wrote: Most Recent Primary Care Visit:  Provider: Laneta Pintos  Department: [facility]  Visit Type: OFFICE VISIT  Date: 05/04/2023  Medication: zolpidem (AMBIEN CR) 12.5 MG CR tablet [[id number]]  Has the patient contacted their pharmacy? Yes (Agent: If no, request that the patient contact the pharmacy for the refill. If patient does not wish to contact the pharmacy document the reason why and proceed with request.) (Agent: If yes, when and what did the pharmacy advise?)Advised to contact Dr Arabella Beach  Is this the correct pharmacy for this prescription? Yes If no, delete pharmacy and type the correct one.  This is the patient's preferred pharmacy:  [facility] [id number] [place], [place] - [address] [place] [address] Phone: [phone] Fax: [phone]   Has the prescription been filled recently? No  Is the patient out of the medication? Yes  Has the patient been seen for an appointment in the last year OR does the patient have an upcoming appointment? Yes  Can we respond through MyChart? No  Agent: Please be advised that Rx refills may take up to 3 business days. We ask that you follow-up with your pharmacy.

## 2023-06-15 MED ORDER — ZOLPIDEM TARTRATE ER 12.5 MG PO TBCR: 12.5000 mg | EXTENDED_RELEASE_TABLET | Freq: Every evening | ORAL | 0 refills | Status: DC | PRN

## 2023-06-15 NOTE — Telephone Encounter (Signed)
 Attached message to open encounter in patient's chart.  Copied from CRM [id number]. Topic: Clinical - Medication Refill >> Jun 15, 2023 10:49 AM Juluis Ok wrote: Patient requesting status of medication, advised med refills can take up to 3 business days. Patient requesting a 90 day supply.

## 2023-06-28 NOTE — Progress Notes (Unsigned)
 Cardiology Office Note    Patient Name: [name] Date of Encounter: 06/28/2023  Primary Care Provider:  Laneta Pintos, MD Primary Cardiologist:  David Berger, MD Primary Electrophysiologist: None   Past Medical History    Past Medical History:  Diagnosis Date   Arthritis    hands    Cancer [facility])    prostate cancer    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   Hyperlipidemia    Hypertension    Prostate cancer (HCC)    Recurrent upper respiratory infection (URI)    pt had a cold recent- week ago - better now    Tremor     History of Present Illness  [name] is a 78 y.o. male with PMH of nonobstructive CAD, aortic atherosclerosis, HTN, HLD, prostate CA s/p radical prostatectomy who presents today for annual follow-up.  [name] was last seen in our office on 05/14/2022 for follow-up of hypertension.  During his visit he reported feeling much better and blood pressures were stable in the 115-120 range.  He reported compliance with his medications and denied any adverse reactions.  He was in the process of weaning off of gabapentin  and was continued on current medication regimens with no changes at that time.  [name] presents today with his wife for annual follow-up. He has been experiencing episodes of hypotension, with readings as low as 90/53 mmHg, particularly around noon after taking his morning medications. These episodes have been occurring since March and April, with diastolic readings often in the 50s. He takes amlodipine  and losartan  between 8 and 9 AM, followed by pregabalin or primidone , spacing them out by about an hour. During these episodes, he feels listless. He drinks five to six glasses of water  daily, primarily consuming water  and decaf coffee. He has been on pregabalin for several months, with the dose increased to 150 mg three times a day before January. Previously, he was on gabapentin , which caused hypertension, leading to a switch to pregabalin. He also takes lorazepam  once  or twice a month for anxiety, being cautious about its use when he needs to be alert. He experienced a significant episode of blurred vision and feeling faint while shopping, which resolved after drinking a [facility]. No pain or symptoms suggestive of a stroke were present during this episode. No restless legs, weakness in arms or legs, and significant visual changes apart from the blurred vision episode. No pain during the hypotensive episodes. He underwent a CT scan in February, which showed aortic sclerosis and a small aortic aneurysm measuring 4.1 cm, unchanged from the previous year. He is on Crestor  for cholesterol management. His triglycerides were slightly elevated at 160 mg/dL, attributed to dietary factors.  Please he has a history of an artificial sphincter installation nearly two years ago, which has caused persistent pain. He follows a heart-healthy diet, influenced by his wife's diabetes. Patient denies chest pain, palpitations, dyspnea, PND, orthopnea, nausea, vomiting, dizziness, syncope, edema, weight gain, or early satiety.  Discussed the use of AI scribe software for clinical note transcription with the patient, who gave verbal consent to proceed.  History of Present Illness    Review of Systems  Please see the history of present illness.    All other systems reviewed and are otherwise negative except as noted above.  Physical Exam    Wt Readings from Last 3 Encounters:  05/04/23 182 lb 6.4 oz (82.7 kg)  04/28/23 182 lb 9.6 oz (82.8 kg)  02/03/23 178 lb (80.7  kg)   ZO:XWRUE were no vitals filed for this visit.,There is no height or weight on file to calculate BMI. GEN: Well nourished, well developed in no acute distress Neck: No JVD; No carotid bruits Pulmonary: Clear to auscultation without rales, wheezing or rhonchi  Cardiovascular: Normal rate. Regular rhythm. Normal S1. Normal S2.   Murmurs: There is no murmur.  ABDOMEN: Soft, non-tender,  non-distended EXTREMITIES:  No edema; No deformity   EKG/LABS/ Recent Cardiac Studies   ECG personally reviewed by me today -sinus bradycardia with first-degree AVB and left axis deviation with rate of 56 bpm and no significant changes compared to previous EKG.  Risk Assessment/Calculations:          Lab Results  Component Value Date   WBC 5.4 03/26/2023   HGB 12.4 (L) 03/26/2023   HCT 37.0 (L) 03/26/2023   MCV 88.7 03/26/2023   PLT 155 03/26/2023   Lab Results  Component Value Date   CREATININE 0.89 03/26/2023   BUN 23 03/26/2023   NA 140 03/26/2023   K 4.1 03/26/2023   CL 107 03/26/2023   CO2 29 03/26/2023   Lab Results  Component Value Date   CHOL 133 05/04/2023   HDL 39 (L) 05/04/2023   LDLCALC 66 05/04/2023   TRIG 167 (H) 05/04/2023   CHOLHDL 3.4 05/04/2023    Lab Results  Component Value Date   HGBA1C 5.6 05/04/2023   Assessment & Plan    1.  Coronary calcifications: - Patient reports no chest pain or angina since previous follow-up. - Continue current GDMT with Crestor  10 mg  2.  Hypotension: -Blood pressure readings low, diastolic in 50s. Amlodipine  and pregabalin may contribute to hypotension. Orthostatic hypotension not confirmed. Blood pressure control crucial for aortic aneurysm prevention. Discontinued amlodipine . - Discontinue amlodipine . - Monitor blood pressure regularly. - Contact office if blood pressure trends upwards. - Consider reducing losartan  to 50 mg if low diastolic persists. Orthostatic VS for the past 24 hrs (Last 3 readings):  BP- Lying Pulse- Lying BP- Sitting Pulse- Sitting BP- Standing at 0 minutes Pulse- Standing at 0 minutes BP- Standing at 3 minutes Pulse- Standing at 3 minutes  06/29/23 1628 150/73 53 132/76 51 116/67 59 122/69 59     3.  Hyperlipidemia: - Patient's last LDL cholesterol was 66 - Continue current treatment plan with Crestor  10 mg daily  4.  History of prostate CA: -s/p prostatectomy and is currently  followed by urology   5.  Aortic atherosclerosis: -Aneurysm stable at 4.1 cm. Blood pressure control essential to prevent progression. - Continue monitoring with annual CT scans.  Disposition: Follow-up with David Berger, MD or APP in 6 months    Signed, Francene Ing, Retha Cast, NP 06/28/2023, 12:28 PM [facility]

## 2023-06-29 ENCOUNTER — Ambulatory Visit

## 2023-06-29 ENCOUNTER — Ambulatory Visit: Attending: Nurse Practitioner | Admitting: Nurse Practitioner

## 2023-06-29 ENCOUNTER — Encounter: Payer: Self-pay | Admitting: Nurse Practitioner

## 2023-06-29 VITALS — BP 126/64 | HR 56 | Ht 70.0 in | Wt 179.4 lb

## 2023-06-29 DIAGNOSIS — C61 Malignant neoplasm of prostate: Secondary | ICD-10-CM

## 2023-06-29 DIAGNOSIS — I2510 Atherosclerotic heart disease of native coronary artery without angina pectoris: Secondary | ICD-10-CM | POA: Insufficient documentation

## 2023-06-29 DIAGNOSIS — R55 Syncope and collapse: Secondary | ICD-10-CM | POA: Insufficient documentation

## 2023-06-29 DIAGNOSIS — I700 Atherosclerosis of aorta: Secondary | ICD-10-CM

## 2023-06-29 DIAGNOSIS — I10 Essential (primary) hypertension: Secondary | ICD-10-CM

## 2023-06-29 DIAGNOSIS — E782 Mixed hyperlipidemia: Secondary | ICD-10-CM | POA: Insufficient documentation

## 2023-06-29 NOTE — Progress Notes (Unsigned)
 Enrolled for [employer] to mail a ZIO XT long term holter monitor to the patients address on file.   Serial # [id number] mailed to patient, applied in office.  Dr. Avanell Bob to read.

## 2023-06-29 NOTE — Patient Instructions (Addendum)
 Medication Instructions:  STOP Norvasc  (Amlodipine )  *If you need a refill on your cardiac medications before your next appointment, please call your pharmacy*  Lab Work: None ordered If you have labs (blood work) drawn today and your tests are completely normal, you will receive your results only by: MyChart Message (if you have MyChart) OR A paper copy in the mail If you have any lab test that is abnormal or we need to change your treatment, we will call you to review the results.  Testing/Procedures: [name]- Long Term Monitor Instructions  Your physician has requested you wear a ZIO patch monitor for 14 days.  This is a single patch monitor. [employer] supplies one patch monitor per enrollment. Additional stickers are not available. Please do not apply patch if you will be having a Nuclear Stress Test,  Echocardiogram, Cardiac CT, MRI, or Chest Xray during the period you would be wearing the  monitor. The patch cannot be worn during these tests. You cannot remove and re-apply the  ZIO XT patch monitor.  Your ZIO patch monitor will be mailed 3 day USPS to your address on file. It may take 3-5 days  to receive your monitor after you have been enrolled.  Once you have received your monitor, please review the enclosed instructions. Your monitor  has already been registered assigning a specific monitor serial # to you.  Billing and Patient Assistance Program Information  We have supplied [employer] with any of your insurance information on file for billing purposes. [employer] offers a sliding scale Patient Assistance Program for patients that do not have  insurance, or whose insurance does not completely cover the cost of the ZIO monitor.  You must apply for the Patient Assistance Program to qualify for this discounted rate.  To apply, please call [employer] at [phone], select option 4, select option 2, ask to apply for  Patient Assistance Program. [name] will ask your household income, and  how many people  are in your household. They will quote your out-of-pocket cost based on that information.  [employer] will also be able to set up a 50-month, interest-free payment plan if needed.  Applying the monitor   Shave hair from upper left chest.  Hold abrader disc by orange tab. Rub abrader in 40 strokes over the upper left chest as  indicated in your monitor instructions.  Clean area with 4 enclosed alcohol pads. Let dry.  Apply patch as indicated in monitor instructions. Patch will be placed under collarbone on left  side of chest with arrow pointing upward.  Rub patch adhesive wings for 2 minutes. Remove white label marked "1". Remove the white  label marked "2". Rub patch adhesive wings for 2 additional minutes.  While looking in a mirror, press and release button in center of patch. A small green light will  flash 3-4 times. This will be your only indicator that the monitor has been turned on.  Do not shower for the first 24 hours. You may shower after the first 24 hours.  Press the button if you feel a symptom. You will hear a small click. Record Date, Time and  Symptom in the Patient Logbook.  When you are ready to remove the patch, follow instructions on the last 2 pages of Patient  Logbook. Stick patch monitor onto the last page of Patient Logbook.  Place Patient Logbook in the blue and white box. Use locking tab on box and tape box closed  securely. The blue and white box  has prepaid postage on it. Please place it in the mailbox as  soon as possible. Your physician should have your test results approximately 7 days after the  monitor has been mailed back to [facility].  Call [facility] Customer Care at [phone] if you have questions regarding  your ZIO XT patch monitor. Call them immediately if you see an orange light blinking on your  monitor.  If your monitor falls off in less than 4 days, contact our Monitor department at [phone].  If your monitor  becomes loose or falls off after 4 days call [employer] at [phone] for  suggestions on securing your monitor   Follow-Up: At [facility], you and your health needs are our priority.  As part of our continuing mission to provide you with exceptional heart care, our providers are all part of one team.  This team includes your primary Cardiologist (physician) and Advanced Practice Providers or APPs (Physician Assistants and Nurse Practitioners) who all work together to provide you with the care you need, when you need it.  Your next appointment:   6 month(s)  Provider:   Ola Berger, MD    We recommend signing up for the patient portal called "MyChart".  Sign up information is provided on this After Visit Summary.  MyChart is used to connect with patients for Virtual Visits (Telemedicine).  Patients are able to view lab/test results, encounter notes, upcoming appointments, etc.  Non-urgent messages can be sent to your provider as well.   To learn more about what you can do with MyChart, go to [email or web address].   Other Instructions

## 2023-07-03 ENCOUNTER — Ambulatory Visit: Attending: Cardiology

## 2023-07-07 ENCOUNTER — Other Ambulatory Visit: Payer: Self-pay | Admitting: Family Medicine

## 2023-07-07 ENCOUNTER — Telehealth: Payer: Self-pay | Admitting: *Deleted

## 2023-07-07 NOTE — Telephone Encounter (Signed)
     Pre-operative Risk Assessment    Patient Name: [name]  DOB: 07-01-1945 MRN: [id number]   Date of last office visit: 06-29-23  Date of next office visit: 12-2023    Request for Surgical Clearance    Procedure:  [facility] FINGER A1  PULLEY RELEASE   Date of Surgery:  Clearance 07/29/23                               Surgeon:  DR Auburn Blaze  Surgeon's Group or Practice Name:   [facility] Phone number: [phone] Fax number:  [phone]  ATTN SHERRY WILLS   Type of Clearance Requested:   - Medical    Type of Anesthesia:  MAC   Additional requests/questions:  N/A   [name]   07/07/2023, 9:26 AM

## 2023-07-08 ENCOUNTER — Other Ambulatory Visit: Payer: Self-pay | Admitting: Family Medicine

## 2023-07-08 NOTE — Telephone Encounter (Signed)
 FYI - pt scheduled for 07/28/23. He is out of town from 5/20-5/30 so that was the soonest we could get him in.

## 2023-07-08 NOTE — Telephone Encounter (Signed)
   Name: [name]  DOB: 1946/01/04  MRN: [id number]  Primary Cardiologist: Ola Berger, MD  Chart reviewed as part of pre-operative protocol coverage. Because of [name]'s past medical history and time since last visit, he will require a follow-up in-office visit in order to better assess preoperative cardiovascular risk. Per David Connor, NP, who last saw the pt  in office, he will require office visit for clearance.  Pre-op covering staff: - Please schedule appointment and call patient to inform them. If patient already had an upcoming appointment within acceptable timeframe, please add "pre-op clearance" to the appointment notes so provider is aware. - Please contact requesting surgeon's office via preferred method (i.e, phone, fax) to inform them of need for appointment prior to surgery.  Jude Norton, NP  07/08/2023, 2:49 PM

## 2023-07-08 NOTE — Telephone Encounter (Unsigned)
 Copied from CRM [id number]. Topic: Clinical - Medication Refill >> Jul 08, 2023  2:51 PM Oddis Bench wrote: Medication: zolpidem  (AMBIEN  CR) 12.5 MG CR tablet  Has the patient contacted their pharmacy? No Sent the patient to office  This is the patient's preferred pharmacy:  [facility] [id number] [place], [place] - [address] [place] [address] Phone: [phone] Fax: [phone]  Is this the correct pharmacy for this prescription? Yes If no, delete pharmacy and type the correct one.   Has the prescription been filled recently? Yes  Is the patient out of the medication? No  Has the patient been seen for an appointment in the last year OR does the patient have an upcoming appointment? Yes  Can we respond through MyChart? No  Agent: Please be advised that Rx refills may take up to 3 business days. We ask that you follow-up with your pharmacy.

## 2023-07-08 NOTE — Telephone Encounter (Signed)
 Last Fill: 06/15/23 30 tabs/0 RF  Last OV: 05/04/23 Next OV: 09/03/23  Routing to provider for review/authorization.

## 2023-07-08 NOTE — Telephone Encounter (Signed)
 Started leaving a message for pt when phone disconnected. Pt will need in office appt for preop clearance.

## 2023-07-08 NOTE — Telephone Encounter (Signed)
 Called back and was able to leave message to call back to schedule in office preop appt.

## 2023-07-09 NOTE — Telephone Encounter (Signed)
 Patient has been scheduled for 07/28/23 for preop clearance patient's surgery is scheduled next day 07/29/23 will forward this message to requesting office to make them aware patient is scheduled for his ov the day before surgery due to patient's being out of town 5/20-5/30

## 2023-07-13 ENCOUNTER — Telehealth: Payer: Self-pay

## 2023-07-13 NOTE — Telephone Encounter (Signed)
 Copied from CRM [id number]. Topic: Clinical - Medication Question >> Jul 13, 2023  9:50 AM Everette C wrote: Reason for CRM: The patient has called for an update on their prescription for 90 day prescription of zolpidem  (AMBIEN  CR) 12.5 MG CR tablet   The patient will be traveling tomorrow 07/14/23 and would like to pick up their medication today, please contact the patient further when available

## 2023-07-13 NOTE — Telephone Encounter (Signed)
 Contacted patient and the prescription was sent in on 07/08/23. Informed patient to check with pharmacy to see if it's been filled. Patient stated the med hasn't been filled - he thinks it's too early. We will call I back if we need to contact the pharmacy.

## 2023-07-14 NOTE — Telephone Encounter (Unsigned)
 Copied from CRM [id number]. Topic: Clinical - Prescription Issue >> Jul 14, 2023  9:25 AM Lysbeth Sauger wrote: Reason for CRM: He wants Dr.Olson to send him up a prescription for TRIAMCINOLONE  0.5% CREAM

## 2023-07-15 NOTE — Telephone Encounter (Signed)
 Copied from CRM [id number]. Topic: Clinical - Medication Question >> Jul 15, 2023 10:33 AM Hobson Luna F wrote: Reason for CRM: Patient is calling in checking on the status of his medication request.

## 2023-07-17 MED ORDER — TRIAMCINOLONE ACETONIDE 0.5 % EX CREA: 1.0000 | TOPICAL_CREAM | Freq: Two times a day (BID) | CUTANEOUS | 2 refills | Status: DC

## 2023-07-17 NOTE — Telephone Encounter (Signed)
Pt informed

## 2023-07-17 NOTE — Addendum Note (Signed)
 Addended by: Laneta Pintos on: 07/17/2023 07:02 AM   Modules accepted: Orders

## 2023-07-17 NOTE — Progress Notes (Signed)
  Cardiology Office Note:  .   Date:  07/28/2023  ID:  Seve, Monette September 18, 1945, MRN [id number] PCP: Laneta Pintos, MD  [facility] Providers Cardiologist:  Ola Berger, MD    History of Present Illness: .   [name] is a 78 y.o. male  with PMH of nonobstructive CAD, aortic atherosclerosis/aneurysm, HTN, HLD, prostate CA s/p radical prostatectomy.  Patient was last seen 06/2023 with orthostatic hypotension and amlodipine  stopped.   Patient needs cardiac clearance for surgery tomorrow. Dizziness has improved but he's still had some dizziness when standing on occasion and gets off balance. Denies palpitations. Drinking water  all day 16 ounce 4 times a day. No bleeding problems.  ROS:    Studies Reviewed: Aaron Aas         Prior CV Studies:   CT 03/2023 IMPRESSION: 1. Stable examination status post prostatectomy without evidence of local recurrence or metastatic disease within the chest, abdomen or pelvis. 2. Ascending thoracic aortic aneurysm measuring 4.1 cm, unchanged from prior. Suggest continued attention on follow-up oncologic imaging. 3.  Aortic Atherosclerosis (ICD10-I70.0).    Risk Assessment/Calculations:             Physical Exam:   VS:  BP 105/60 (BP Location: Left Arm, Cuff Size: Normal)    Wt Readings from Last 3 Encounters:  06/29/23 179 lb 6.4 oz (81.4 kg)  05/04/23 182 lb 6.4 oz (82.7 kg)  04/28/23 182 lb 9.6 oz (82.8 kg)    GEN: Well nourished, well developed in no acute distress NECK: No JVD; No carotid bruits CARDIAC:  RRR, no murmurs, rubs, gallops RESPIRATORY:  Clear to auscultation without rales, wheezing or rhonchi  ABDOMEN: Soft, non-tender, non-distended EXTREMITIES:  No edema; No deformity   ASSESSMENT AND PLAN: .    Preop clearance for pulley release of right middle finger Dr. Auburn Blaze 07/29/23  Patient is still very orthostatic in the office today. Check CBC and CMET today. Stop losartan , come back tomorrow for repeat  orthostatics. Surgery likely will need to be delayed. Zio monitor not back but no palpitations. Will make surgical clearance decision tomorrow.  Addendum: Patient returned today for orthostatic BP checks. BP was up but still dropped with change in position.  results were reviewed with Dr Albert Huff, who would like patient to continue losartan  but decrease to 25mg  daily and instructed patient to follow up with neurologist to adjust primidone  dosage as that could be contributing to orthostatic hypotension, patient confirms he has appointment this afternoon with neuro. Patient can proceed with above surgery tomorrow. Consider giving extra fluids.  According to the Revised Cardiac Risk Index (RCRI), his Perioperative Risk of Major Cardiac Event is (%): 0.9  His Functional Capacity in METs is: 6.61 according to the Duke Activity Status Index (DASI).   CAD nonobstructive on CT scans  HTN-see below  Orthostatic hypotension symptoms-amlodipine  stoppled 06/2023-very orthostatic in office today. Stop losartan , hydrate, compression hose. Come back tomorrow for repeat orthostatics.  Aortic aneurysm 4.1 cm CT 03/2023        Dispo: f/u  Signed, Theotis Flake, PA-C

## 2023-07-28 ENCOUNTER — Ambulatory Visit: Attending: Physician Assistant | Admitting: Physician Assistant

## 2023-07-28 VITALS — BP 105/60

## 2023-07-28 DIAGNOSIS — I10 Essential (primary) hypertension: Secondary | ICD-10-CM

## 2023-07-28 DIAGNOSIS — I2510 Atherosclerotic heart disease of native coronary artery without angina pectoris: Secondary | ICD-10-CM | POA: Diagnosis not present

## 2023-07-28 DIAGNOSIS — Z01818 Encounter for other preprocedural examination: Secondary | ICD-10-CM

## 2023-07-28 DIAGNOSIS — I7121 Aneurysm of the ascending aorta, without rupture: Secondary | ICD-10-CM

## 2023-07-28 DIAGNOSIS — I951 Orthostatic hypotension: Secondary | ICD-10-CM

## 2023-07-28 NOTE — Patient Instructions (Addendum)
 Medication Instructions:  Your physician recommends that you continue on your current medications as directed. Please refer to the Current Medication list given to you today.  *If you need a refill on your cardiac medications before your next appointment, please call your pharmacy*  Lab Work: TODAY: THYROID  WITH TSH, CMET, CBC  If you have labs (blood work) drawn today and your tests are completely normal, you will receive your results only by: MyChart Message (if you have MyChart) OR A paper copy in the mail If you have any lab test that is abnormal or we need to change your treatment, we will call you to review the results.  Testing/Procedures: NONE  Follow-Up: At [facility], you and your health needs are our priority.  As part of our continuing mission to provide you with exceptional heart care, our providers are all part of one team.  This team includes your primary Cardiologist (physician) and Advanced Practice Providers or APPs (Physician Assistants and Nurse Practitioners) who all work together to provide you with the care you need, when you need it.  Your next appointment:   1 month(s)  Provider:   Ola Berger, MD or Theotis Flake, PA-C  YOUR PROVIDER RECOMMENDS THAT YOU HAVE A NURSE VISIT TOMORROW 07/29/23 FOR RECHECK ORTHOSTATICS   We recommend signing up for the patient portal called "MyChart".  Sign up information is provided on this After Visit Summary.  MyChart is used to connect with patients for Virtual Visits (Telemedicine).  Patients are able to view lab/test results, encounter notes, upcoming appointments, etc.  Non-urgent messages can be sent to your provider as well.   To learn more about what you can do with MyChart, go to [email or web address].

## 2023-07-29 ENCOUNTER — Ambulatory Visit: Payer: Self-pay | Admitting: Physician Assistant

## 2023-07-29 ENCOUNTER — Ambulatory Visit: Payer: Self-pay | Admitting: Psychology

## 2023-07-29 ENCOUNTER — Ambulatory Visit: Attending: Cardiology

## 2023-07-29 ENCOUNTER — Ambulatory Visit (HOSPITAL_BASED_OUTPATIENT_CLINIC_OR_DEPARTMENT_OTHER): Admit: 2023-07-29 | Admitting: Orthopedic Surgery

## 2023-07-29 ENCOUNTER — Encounter (HOSPITAL_BASED_OUTPATIENT_CLINIC_OR_DEPARTMENT_OTHER): Payer: Self-pay

## 2023-07-29 ENCOUNTER — Ambulatory Visit (INDEPENDENT_AMBULATORY_CARE_PROVIDER_SITE_OTHER): Admitting: Psychology

## 2023-07-29 ENCOUNTER — Encounter: Payer: Self-pay | Admitting: Psychology

## 2023-07-29 ENCOUNTER — Telehealth: Payer: Self-pay | Admitting: Cardiology

## 2023-07-29 VITALS — Resp 18 | Ht 71.0 in | Wt 178.2 lb

## 2023-07-29 DIAGNOSIS — I951 Orthostatic hypotension: Secondary | ICD-10-CM

## 2023-07-29 DIAGNOSIS — R4189 Other symptoms and signs involving cognitive functions and awareness: Secondary | ICD-10-CM

## 2023-07-29 DIAGNOSIS — F419 Anxiety disorder, unspecified: Secondary | ICD-10-CM

## 2023-07-29 DIAGNOSIS — I2510 Atherosclerotic heart disease of native coronary artery without angina pectoris: Secondary | ICD-10-CM | POA: Diagnosis not present

## 2023-07-29 DIAGNOSIS — I10 Essential (primary) hypertension: Secondary | ICD-10-CM | POA: Diagnosis not present

## 2023-07-29 LAB — COMPREHENSIVE METABOLIC PANEL WITH GFR
ALT: 14 IU/L (ref 0–44)
AST: 23 IU/L (ref 0–40)
Albumin: 4.4 g/dL (ref 3.8–4.8)
Alkaline Phosphatase: 67 IU/L (ref 44–121)
BUN/Creatinine Ratio: 22 (ref 10–24)
BUN: 21 mg/dL (ref 8–27)
CO2: 23 mmol/L (ref 20–29)
Calcium: 9.7 mg/dL (ref 8.6–10.2)
Chloride: 103 mmol/L (ref 96–106)
Creatinine, Ser: 0.97 mg/dL (ref 0.76–1.27)
Globulin, Total: 2.5 g/dL (ref 1.5–4.5)
Glucose: 100 mg/dL — ABNORMAL HIGH (ref 70–99)
Potassium: 4.7 mmol/L (ref 3.5–5.2)
Sodium: 139 mmol/L (ref 134–144)
Total Protein: 6.9 g/dL (ref 6.0–8.5)
eGFR: 80 mL/min/{1.73_m2} (ref >59)

## 2023-07-29 LAB — CBC
Hematocrit: 38.1 % (ref 37.5–51.0)
Hemoglobin: 12.2 g/dL — ABNORMAL LOW (ref 13.0–17.7)
MCH: 29.5 pg (ref 26.6–33.0)
MCHC: 32 g/dL (ref 31.5–35.7)
MCV: 92 fL (ref 79–97)
Platelets: 128 10*3/uL — ABNORMAL LOW (ref 150–450)
RBC: 4.14 x10E6/uL (ref 4.14–5.80)
RDW: 13.6 % (ref 11.6–15.4)
WBC: 5 10*3/uL (ref 3.4–10.8)

## 2023-07-29 LAB — THYROID PANEL WITH TSH
Free Thyroxine Index: 1.7 (ref 1.2–4.9)
T3 Uptake Ratio: 27 % (ref 24–39)
T4, Total: 6.2 ug/dL (ref 4.5–12.0)
TSH: 3.74 u[IU]/mL (ref 0.450–4.500)

## 2023-07-29 SURGERY — RELEASE, A1 PULLEY, FOR TRIGGER FINGER
Anesthesia: LOCAL | Laterality: Right

## 2023-07-29 MED ORDER — LOSARTAN POTASSIUM 25 MG PO TABS: 25.0000 mg | ORAL_TABLET | Freq: Every day | ORAL | 3 refills | Status: DC

## 2023-07-29 NOTE — Telephone Encounter (Signed)
 RX sent to requested Pharmacy

## 2023-07-29 NOTE — Progress Notes (Signed)
   Nurse Visit   Date of Encounter: 07/29/2023 ID: [name], DOB 1945-10-28, MRN [id number]  PCP:  Laneta Pintos, MD   [facility] Providers Cardiologist:  Ola Berger, MD      Visit Details   VS:  BP 114/70 (BP Location: Right Arm, Patient Position: Standing, Cuff Size: Normal)   Pulse 60   Resp 18   Ht 5' 11 (1.803 m)   Wt 178 lb 3.2 oz (80.8 kg)   SpO2 94%   BMI 24.85 kg/m  , BMI Body mass index is 24.85 kg/m.  Wt Readings from Last 3 Encounters:  07/29/23 178 lb 3.2 oz (80.8 kg)  06/29/23 179 lb 6.4 oz (81.4 kg)  05/04/23 182 lb 6.4 oz (82.7 kg)     Reason for visit: Orthostatic BP Performed today: Vitals, Provider consulted:Dr Albert Huff and Reesa Cannon (per her request), and Education Changes (medications, testing, etc.) : none today Length of Visit: 40 minutes    Medications Adjustments/Labs and Tests Ordered: No orders of the defined types were placed in this encounter.  No orders of the defined types were placed in this encounter.  Patient presents today for orthostatic blood pressure Lying: 151/78  Sitting: 128/76 Standing: 114/70 Complete vitals in chart for review. Reviewed results with Dr Albert Huff, would like patient to continue losartan  but decrease to 25mg  daily and instructed patient to follow up with neurologist to adjust primidone  dosage as that could be contributing to orthostatic hypotension, patient confirms he has appointment this afternoon with neuro. Contacted Reesa Cannon with results per her request, will update notes for pre-op. Trigger finger release scheduled with [facility] for tomorrow, contacted their office to confirm since Epic shows today and that its cancelled, their office confirmed he is in fact still scheduled for tomorrow morning. Patient asymptomatic with positions changes today.     Signed, Dareen Ebbing, RN  07/29/2023 11:30 AM

## 2023-07-29 NOTE — Progress Notes (Unsigned)
 NEUROPSYCHOLOGICAL EVALUATION [place]  [place] Department of Neurology  Date of Evaluation: 07/29/2023  REASON FOR REFERRAL   [name] is a 78 year old, right-handed, White male with 16 years of formal education. He was referred for neuropsychological evaluation by his neurologist, Ivette Marks Tat, D.O., to assess current neurocognitive functioning, document potential cognitive deficits, and assist with treatment planning. This is his first neuropsychological evaluation.  SUMMARY OF RESULTS   Premorbid cognitive abilities are estimated to be in the average range based on word reading and sociodemographic factors. Consistent this estimate, most measures were within age-related expectations, with few exceptions.   Specifically, he scored low on a visual scanning task that included a motor component; however, given observed tremulousness, his slowed performance was likely influenced by motor factors. On a more challenging version of this task that required alternating attention, his performance was within normal limits. He also demonstrated low semantic fluency, though other language abilities--including phonemic fluency and confrontation naming--were intact.  Performance across remaining cognitive domains--including attention/working memory, processing speed, executive functioning, visuospatial abilities, and learning/memory--was within age-related expectations. On self-report questionnaires, he did not endorse clinically-elevated levels of depression or anxiety.  DIAGNOSTIC IMPRESSION   Results of the current evaluation indicated isolated weaknesses on tasks of visual scanning and semantic fluency, with otherwise generally normal test performance. Functional independence is maintained. At this time, there is not enough impairment to warrant a diagnosis of mild cognitive impairment. Etiology of subjective cognitive concerns is likely multifactorial, including normal  aging, anxiety, and hearing loss. Additionally, he has multiple chronic vascular risk factors which may be contributory, though the extent of possible vascular pathology remains uncertain in the absence of neuroimaging. To the degree that modifiable factors can be ameliorated, he may find that his subjective cognitive concerns improve. Results further serve as a baseline for future comparison, should it ever become necessary to re-evaluate the patient. Recommendations are listed below.   ICD-10 Codes: R41.89 Cognitive changes; F41.9 Anxiety  RECOMMENDATIONS   While the patient is not currently considering surgical treatment options for his tremor, today's evaluation suggests that he would be an appropriate candidate should he choose to pursue one of these options in the near future.  In consultation with your doctor, schedule cognitive reevaluation on an as-needed basis to assess for cognitive decline and update treatment recommendations. Reevaluation should occur during a period of medical and affective stability.  Monitor anxiety, especially given that emotional distress can exacerbate cognitive difficulties. If you find that symptoms begin to impact her daily life, consider speaking with a mental health professional or consulting with your primary care provider about medication options. You may also wish to consider stress reduction techniques such as mindfulness, deep breathing exercises  Continue to prioritize physical health through diet, exercise, and sleep: Regular physical activity supports cardiovascular health, improves mood, and helps preserve mobility and independence. Aim for at least 150 minutes of moderate aerobic exercise per week (e.g., brisk walking, swimming, gardening). A brain-healthy diet such as the Mediterranean or MIND diet is rich in fruits, vegetables, whole grains, healthy fats, and lean proteins, and has been associated with reduced risk of cognitive decline. Additionally,  getting adequate, quality sleep and managing chronic conditions with the help of healthcare providers are essential components of healthy aging.  Continue to stay socially and mentally engaged. Maintaining strong social connections and regularly stimulating your brain can help protect against cognitive decline. This includes staying connected with friends and family, volunteering, or participating in community groups.  Mentally engaging activities--such as reading, doing puzzles, playing strategy games, or learning a new language or musical instrument--promote brain plasticity. If you are interested in activities to support cognitive engagement, this site offers a variety of apps and games organized by difficulty level:  https://www.[facility].org/get-to-know-barrow/centers-programs/neurorehabilitation-center/neuro-rehab-apps-and-games/  Consider implementing compensatory strategies to maximize independence and maintain daily functioning. Examples include:   -Adhere to routine. Compensatory strategies work best when they are used consistently. Use a planner, calendar, or white board that has the schedule and important events for the day clearly listed to reference and cross off when tasks are complete.   -Ask for written information, especially if it is new or unfamiliar (e.g., information provided at a doctor's appointment).   -Create an organized environment. Keep items that can be easily misplaced in a sensible location and get into the habit of always returning the items to those places.   -Pay attention and reduce distractions. Make a point of focusing attention on information you want to remember. One-on-one interaction is more likely to facilitate attention and minimize distraction. Make eye contact and repeat the information out loud after you hear it. Reduce interruptions or distractions especially when attempting to learn new information.   -Create associations. When learning something new,  think about and understand the information. Explain it in your own words or try to associate it with something you already know. Take notes to help remember important details.  -Evaluate goals and plan accordingly. When confronted by many different tasks, begin by making a list that prioritizes each task and estimates the time it will take to complete. Break down complicated tasks into smaller, more manageable steps.   -Focus on one task at a time and complete each task before starting another. Avoid multitasking.  DISPOSITION   Patient will follow up with the referring provider, Dr. Winferd Hatter. No follow-up neuropsychological testing was scheduled at this time. Please feel free to refer the patient for repeated evaluation if he shows a significant change in neurocognitive status. He will be provided verbal feedback in approximately one week regarding the findings and impression during this visit.  The remainder of the report includes the details of the patient's background and a table of results from the current evaluation, which support the summary and recommendations described above.  BACKGROUND   History of Presenting Illness: The following information was obtained from a review of medical records and an interview with the patient. Patient is under the care of Dr. Winferd Hatter in neurology, primarily for the management of essential tremor. Tremor was first noticed sometime between 2005-2010, initially affecting his left hand for the first nine years. It has since progressed to involve both hands, though it remains more severe on the left side. At his most recent neurology visit, it was noted that remaining treatment options are limited, short of deep brain stimulation or focused ultrasound. Although he has not shown strong interest in these interventions to date, he has recently begun asking more questions about them. 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 Botox has been  recommended. It was also noted that he is experiencing memory changes.  Cognitive Functioning: During today's appointment, the patient reported mild cognitive changes over the past five years, which he believes are largely consistent with normal aging. For example, he occasionally forgets details of conversations and experiences some word-finding difficulties, noting that he does not express himself as clearly or articulately as he once did. He also described being somewhat more distractible-- often leaving a  task unfinished and focusing on a new task --as well as being slightly less efficient in planning and organizing. However, he does not consider these changes to be serious. It was primarily his wife who encouraged him to have these concerns evaluated.  Physical Functioning: Patient reported improved sleep with zolpidem . He does not take it every night, but on some occasions, he takes half a tablet at bedtime and may take the other half during the night if he wakes up. Appetite is stable. No changes to sense of taste or smell were reported. He has hearing loss and wears bilateral hearing aids. Vision is stable. He reported occasional balance difficulties and episodes of dizziness, which he believes may be related to blood pressure. He denied any recent falls. Tremor is described above. He experiences chronic perineal and pelvic pain; today, her reports his pain level as 3-4 out of 10 (i.e., 0 = no pain, 10 = most pain).  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 He denied suicidal ideation. He keeps busy at home, often doing yard work, Clinical cytogeneticist bills, responding to emails, working on [employer], and watching television.  Neuroimaging: None.  Other Relevant Medical History: Remarkable for hypertension, hyperlipidemia, nonobstructive coronary artery disease, aortic  atherosclerosis/aneurysm, restless leg syndrome, spasmodic dysphonia, lumbar radiculopathy, and prostate cancer s/p radical prostatectomy. No history of stroke, CNS infection, head injury, or seizure was reported.  Current Medications: Per record, acetaminophen , B-complex vitamins, bisacodyl , lorazepam , losartan , magnesium , pregabalin, primidone , psyllium, rosuvastatin , triamcinolone  cream, vitamin B12, vitamin D3, and zolpidem .  Functional Status: Patient independently performs all basic and instrumental activities of daily living. He has limited his driving due to physical pain when sitting for extended periods. He co-manages the finances with his wife and denies any errors. He independently manages his medications using a pillbox and reports no difficulties. He also did not report any problems using household appliances.  Family Neurological History: Remarkable for dementia in two maternal aunts.  Psychiatric History: Remarkable for anxiety, currently managed with medication. History of depression, counseling, suicidal ideation, hallucinations, and psychiatric hospitalizations was not reported.  Substance Use History: Patient reported infrequent alcohol consumption. Current use of nicotine, marijuana, and illicit substances was denied.  Social and Developmental History: Patient was born in [place], [place]. History of perinatal complications and developmental delays was not reported. Patient is married with two children. He lives with his wife in their private residence.  Educational and Occupational History: No history of childhood learning disability, special education services, or grade retention was reported. 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 He earned a bachelor's degree with a double major in Geophysicist/field seismologist and agricultural education. 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  BEHAVIORAL OBSERVATIONS   Patient arrived on time and was unaccompanied. He ambulated independently and without gait disturbance. He was alert and fully oriented. He was appropriately groomed and dressed for the setting. Bilateral tremor was observed with action. Vision and hearing (with hearing aids) were adequate for testing purposes. Speech was of normal rate and prosody, though volume was low. No conversational word-finding difficulties, paraphasic errors, or dysarthria were observed. Comprehension was conversationally intact. Thought processes were linear, logical, and coherent. Thought content was organized and devoid of delusions. Insight appeared appropriate. Affect was  anxious and congruent with mood. He was cooperative and gave adequate effort during testing, including on standalone and embedded measures of performance validity. Results are thought to accurately reflect his cognitive functioning at this time.  NEUROPSYCHOLOGICAL TESTING RESULTS   Tests Administered: Animal Naming Test; Controlled Oral Word Association Test (COWAT): FAS; Delis-Kaplan Executive Function System (D-KEFS) - Subtest(s): Color-Word Interference Test; Geriatric Anxiety Scale-10 Item (GAS-10); Geriatric Depression Scale Short Form (GDS-SF); Hooper Visual Organization Test (VOT); Hopkins Verbal Learning Test Revised (HVLT-R) - From 1; Judgment of Line Orientation (JLO) - Form H; Neuropsychological Assessment Battery (NAB) Form 1 - Subtest(s): Naming, Shape Learning; Standalone performance validity test (PVT); Test of Premorbid Functioning (TOPF); Trail Making Test (TMT); Wechsler Adult Intelligence Scale Fifth Edition (WAIS-5) - Subtest(s): Similarities, Matrix Reasoning, Digit Sequencing, Running Digits; and Wechsler Memory Scale Fourth Edition (WMS-IV) - Subtest(s): Logical Memory (LM).  Test results are provided in the table below. Whenever  possible, the patient's scores were compared against age-, sex-, and education-corrected normative samples. Interpretive descriptions are based on the AACN consensus conference statement on uniform labeling (Guilmette et al., 2020).  PREMORBID FUNCTIONING RAW  RANGE  TOPF 44 StdS=102 Average  ATTENTION & WORKING MEMORY RAW  RANGE  WAIS-5 Digit Sequencing -- ss=9 Average  WAIS-5 Running Digits -- ss=11 Average  PROCESSING SPEED RAW  RANGE  Trails A 60''0e T=36 Below Average  WAIS-5 Naming Speed Quantity -- ss=10 Average  DKEFS CWIT Color Naming 37''4e ss=9 Average  DKEFS CWIT Word Reading 24''0e ss=11 Average  EXECUTIVE FUNCTION RAW  RANGE  Trails B 108''1e T=44 Average  WAIS-5 Matrix Reasoning -- ss=8 Average  WAIS-5 Similarities -- ss=14 High Average  COWAT Letter Fluency 12+9+12 T=42 Low Average  DKEFS CWIT Inhibition 78''1e ss=9 Average  DKEFS CWIT Inhibition/Switching 70''3e ss=11 Average  LANGUAGE RAW  RANGE  COWAT Letter Fluency 12+9+12 T=42 Low Average  Animal Naming Test 10 T=27 Exceptionally Low  NAB Naming Test 31/31 T=59 WNL  VISUOSPATIAL RAW  RANGE  JLO C/S=24/30 40%ile Average  HVOT 26/30 T=50 Average  VERBAL LEARNING & MEMORY RAW  RANGE  HVLT Learning Trials (3+8+10)/36 T=46 Average  HVLT Delayed Recall 10/12 T=56 Average  HVLT Recognition Hits 12 -- --  HVLT Recognition False Positives 0 -- --  HVLT Discrimination Index 12 T=60 High Average  WMS-IV LM-I  (4+7+9)/53 ss=7 Low Average  WMS-IV LM-II  (7+9)/39 ss=10 Average  WMS-IV LM Recognition  (7+10)/23 26-50%ile Average  VISUAL LEARNING & MEMORY RAW  RANGE  NAB Shape Learning Immediate Recognition (4+4+6)/27 T=48 Average  NAB Shape Learning Delayed Recognition 6/9 T=54 Average  NAB Shape Learning Delayed RDI 9 T=64 Above Average  QUESTIONNAIRES RAW  RANGE  GDS-SF 2 -- Minimal  GAS-10 5 -- Minimal  *Note: ss = scaled score; StdS = standard score; T = t-score; C/S = corrected raw score; WNL = within normal  limits; BNL= below normal limits; D/C = discontinued. Scores from skewed distributions are typically interpreted as WNL (>=16th %ile) or BNL (<16th %ile).   INFORMED CONSENT   Patient was provided with a verbal description of the nature and purpose of the neuropsychological evaluation. Also reviewed were the foreseeable risks and/or discomforts and benefits of the procedure, limits of confidentiality, and mandatory reporting requirements of this provider. Patient was given the opportunity to have their questions answered. Oral consent to participate was provided by the patient.   This report was prepared as part of a clinical evaluation and is not intended for forensic use.  SERVICE   This evaluation was conducted by Janice Meeter, Psy.D. In addition to time spent directly with the patient, total professional time (120 minutes) includes record review, integration of relevant medical history, test selection, interpretation of findings, and report preparation. A technician, Arnulfo Larch, B.S., provided testing and scoring assistance for 115 minutes.  Psychiatric Diagnostic Evaluation Services (Professional): 78295 x 1 Neuropsychological Testing Evaluation Services (Professional): 62130 x 1 Neuropsychological Testing Evaluation Services (Professional): 86578 x 1 Neuropsychological Test Administration and Scoring Radiographer, therapeutic): [phone] x 1 Neuropsychological Test Administration and Scoring (Technician): [phone] x 3  This report was generated using voice recognition software. While this document has been carefully reviewed, transcription errors may be present. I apologize in advance for any inconvenience. Please contact me if further clarification is needed.            Janice Meeter, Psy.D.             Neuropsychologist

## 2023-07-29 NOTE — Telephone Encounter (Signed)
*  STAT* If patient is at the pharmacy, call can be transferred to refill team.   1. Which medications need to be refilled? (please list name of each medication and dose if known) losartan  (COZAAR ) 25 MG tablet   2. Which pharmacy/location (including street and city if local pharmacy) is medication to be sent to?  [id number] [place], [place] - [address]      3. Do they need a 30 day or 90 day supply? 90 day    Pt is out of medication and this was sent to the wrong pharmacy today. He'd like it sent to the pharmacy listed above instead of where it went. He was using that pharmacy when he was out of town temporarily. Please advise

## 2023-07-29 NOTE — Progress Notes (Signed)
   Psychometrician Note   Cognitive testing was administered to [employer] by [name], B.S. (psychometrist) under the supervision of Dr. Janice Meeter, Psy.D., licensed psychologist on 07/29/2023. [name] did not appear overtly distressed by the testing session per behavioral observation or responses across self-report questionnaires. Rest breaks were offered.    The battery of tests administered was selected by Dr. Janice Meeter, Psy.D. with consideration to [name] current level of functioning, the nature of his symptoms, emotional and behavioral responses during interview, level of literacy, observed level of motivation/effort, and the nature of the referral question. This battery was communicated to the psychometrist. Communication between Dr. Janice Meeter, Psy.D. and the psychometrist was ongoing throughout the evaluation and Dr. Janice Meeter, Psy.D. was immediately accessible at all times. Dr. Janice Meeter, Psy.D. provided supervision to the psychometrist on the date of this service to the extent necessary to assure the quality of all services provided.    [name] will return within approximately 1-2 weeks for an interactive feedback session with Dr. Donavon [name] at which time his test performances, clinical impressions, and treatment recommendations will be reviewed in detail. [name] understands he can contact our office should he require our assistance before this time.  A total of 115 minutes of billable time were spent face-to-face with [name] by the psychometrist. This includes both test administration and scoring time. Billing for these services is reflected in the clinical report generated by Dr. Janice Meeter, Psy.D.  This note reflects time spent with the psychometrician and does not include test scores or any clinical interpretations made by Dr. Donavon [name]. The full report will follow in a separate note.

## 2023-07-30 ENCOUNTER — Ambulatory Visit: Payer: Self-pay | Admitting: Nurse Practitioner

## 2023-07-30 DIAGNOSIS — C61 Malignant neoplasm of prostate: Secondary | ICD-10-CM

## 2023-07-30 DIAGNOSIS — E782 Mixed hyperlipidemia: Secondary | ICD-10-CM | POA: Diagnosis not present

## 2023-07-30 DIAGNOSIS — I2510 Atherosclerotic heart disease of native coronary artery without angina pectoris: Secondary | ICD-10-CM | POA: Diagnosis not present

## 2023-07-30 DIAGNOSIS — R55 Syncope and collapse: Secondary | ICD-10-CM

## 2023-07-30 DIAGNOSIS — M65332 Trigger finger, left middle finger: Secondary | ICD-10-CM | POA: Diagnosis not present

## 2023-07-30 DIAGNOSIS — I10 Essential (primary) hypertension: Secondary | ICD-10-CM

## 2023-07-30 DIAGNOSIS — I700 Atherosclerosis of aorta: Secondary | ICD-10-CM

## 2023-07-31 ENCOUNTER — Other Ambulatory Visit: Payer: Self-pay | Admitting: Neurology

## 2023-07-31 DIAGNOSIS — G250 Essential tremor: Secondary | ICD-10-CM

## 2023-07-31 NOTE — Telephone Encounter (Signed)
 Patient returned staff call regarding results.

## 2023-07-31 NOTE — Telephone Encounter (Signed)
 Reviewed results with the patient and he verbalized understating and  thanked me for the call.

## 2023-08-05 ENCOUNTER — Ambulatory Visit (INDEPENDENT_AMBULATORY_CARE_PROVIDER_SITE_OTHER): Admitting: Psychology

## 2023-08-05 ENCOUNTER — Encounter: Admitting: Psychology

## 2023-08-05 DIAGNOSIS — F419 Anxiety disorder, unspecified: Secondary | ICD-10-CM

## 2023-08-05 DIAGNOSIS — R4189 Other symptoms and signs involving cognitive functions and awareness: Secondary | ICD-10-CM

## 2023-08-05 NOTE — Progress Notes (Signed)
   NEUROPSYCHOLOGY FEEDBACK SESSION [name]. [facility]  [place] Department of Neurology  Date of Feedback Session: 08/05/2023  REASON FOR REFERRAL   [name] is a 78 year old, right-handed, White male with 16 years of formal education. He was referred for neuropsychological evaluation by his neurologist, Ivette Marks Tat, D.O., to assess current neurocognitive functioning, document potential cognitive deficits, and assist with treatment planning. This is his first neuropsychological evaluation.  FEEDBACK   Patient completed a comprehensive neuropsychological evaluation on 07/29/2023. Please refer to that encounter for the full report and recommendations. Briefly, results indicated isolated weaknesses on tasks of visual scanning and semantic fluency, with otherwise generally normal test performance. Functional independence is maintained. At this time, there is not enough impairment to warrant a diagnosis of mild cognitive impairment. Etiology of subjective cognitive concerns is likely multifactorial, including normal aging, anxiety, and hearing loss. Additionally, he has multiple chronic vascular risk factors which may be contributory, though the extent of possible vascular pathology remains uncertain in the absence of neuroimaging.  Today, the patient was accompanied by his wife. He was provided verbal feedback regarding the findings and impression during this visit, and his questions were answered. A copy of the report was provided at the conclusion of the visit.  DISPOSITION   Patient will follow up with the referring provider, Dr. Winferd Hatter. No follow-up neuropsychological testing was scheduled at this time. Please feel free to refer the patient for repeated evaluation if he shows a significant change in neurocognitive status.  SERVICE   This feedback session was conducted by Janice Meeter, Psy.D. [place] unit of 16[address] (40 minutes) was billed for Dr. Donavon Fudge' time spent in preparing,  conducting, and documenting the current feedback session.  This report was generated using voice recognition software. While this document has been carefully reviewed, transcription errors may be present. I apologize in advance for any inconvenience. Please contact me if further clarification is needed.

## 2023-08-17 NOTE — Progress Notes (Signed)
 Cardiology Office Note:  .   Date:  08/31/2023  ID:  [name] August 12, 1945, MRN [id number] PCP: Chandra Toribio POUR, MD  [facility] Providers Cardiologist:  Vina Gull, MD Cardiology APP:  Parthenia Olivia HERO, PA-C    History of Present Illness: .   [name] is a 78 y.o. male    with PMH of nonobstructive CAD, aortic atherosclerosis/aneurysm, HTN, HLD, prostate CA s/p radical prostatectomy.   Patient was last seen 06/2023 with orthostatic hypotension and amlodipine  stopped.   I saw patient 07/28/23 for preop clearance but he was very orthostatic in office. I stopped losartan  and check labs which were stable. Repeat ortho check the next day BP was up but did drop with change of position. Dr. Charnelle Bergeman DOD restarted low dose losartan  and instructed patient to follow up with neurologist to adjust primidone  dosage as that could be contributing to orthostatic hypotension, patient confirms he has appointment this afternoon with neuro. Patient was cleared to proceed with above surgery.  Patient comes in with his friend. He still has some dizziness 3-4 times a week. He's drinking enough water  -64 ounces a day, but not wearing compression stockings. He forgot about it. BP readings at home 101/62-130/70's. Having some tingling in legs and feet improved with Magnesium  supplement. He's been trying to increase his primidone  for his essential tremor but Dr. France Lusty thought this could be contributing to orthostatic hypotension. Needs to f/u with neurology.  ROS:    Studies Reviewed: SABRA         Prior CV Studies:    Monitor 08/2023  Patch Wear Time:  13 days and 23 hours (2025-05-09T10:25:53-0400 to 2025-05-23T10:16:32-0400)   IMpression:   SInus rhythm  Rates 38 to 109 bpm  Average HR 60 bpm Rare PACs, rare PVCs Short bursts of SVT, fastest for 5 beats at 140 bpm, longest for 6 beats at 106 bpm   No triggered events / diary entries      CT 03/2023 IMPRESSION: 1. Stable examination status  post prostatectomy without evidence of local recurrence or metastatic disease within the chest, abdomen or pelvis. 2. Ascending thoracic aortic aneurysm measuring 4.1 cm, unchanged from prior. Suggest continued attention on follow-up oncologic imaging. 3.  Aortic Atherosclerosis (ICD10-I70.0).  Risk Assessment/Calculations:             Physical Exam:   VS:  BP (!) 110/58   Pulse (!) 59   Ht 5' 10 (1.778 m)   Wt 179 lb (81.2 kg)   SpO2 97%   BMI 25.68 kg/m    Orhtostatics: Orthostatic VS for the past 24 hrs (Last 3 readings):  BP- Lying Pulse- Lying BP- Sitting Pulse- Sitting BP- Standing at 0 minutes Pulse- Standing at 0 minutes BP- Standing at 3 minutes Pulse- Standing at 3 minutes  08/31/23 1059 128/68 54 105/60 58 106/58 61 106/64 60   Wt Readings from Last 3 Encounters:  08/31/23 179 lb (81.2 kg)  07/29/23 178 lb 3.2 oz (80.8 kg)  06/29/23 179 lb 6.4 oz (81.4 kg)    GEN: Well nourished, well developed in no acute distress NECK: No JVD; No carotid bruits CARDIAC:  RRR, no murmurs, rubs, gallops RESPIRATORY:  Clear to auscultation without rales, wheezing or rhonchi  ABDOMEN: Soft, non-tender, non-distended EXTREMITIES:  No edema; No deformity   ASSESSMENT AND PLAN: .     CAD nonobstructive on CT scans-no chest pain.   HTN-see below   Orthostatic hypotension symptoms-amlodipine  stoppled 06/2023, losartan  decreased,  still orthostatic in office today from lying to sitting/standing. on low dose losartan  25 mg. Will reduce to 12.5 mg daily and take and extra 1/2 if BP >135/85. Compression hose. Dr. Deric Bocock thought Primidone  may be contributing to drop in BP-f/u with neurology.   Aortic aneurysm 4.1 cm CT 03/2023                Dispo: f/u in 6 months.  Signed, [name], PA-C

## 2023-08-31 ENCOUNTER — Ambulatory Visit: Attending: Physician Assistant | Admitting: Physician Assistant

## 2023-08-31 ENCOUNTER — Encounter: Payer: Self-pay | Admitting: Physician Assistant

## 2023-08-31 VITALS — BP 110/58 | HR 59 | Ht 70.0 in | Wt 179.0 lb

## 2023-08-31 DIAGNOSIS — I7121 Aneurysm of the ascending aorta, without rupture: Secondary | ICD-10-CM | POA: Insufficient documentation

## 2023-08-31 DIAGNOSIS — I10 Essential (primary) hypertension: Secondary | ICD-10-CM | POA: Insufficient documentation

## 2023-08-31 DIAGNOSIS — I2510 Atherosclerotic heart disease of native coronary artery without angina pectoris: Secondary | ICD-10-CM | POA: Diagnosis not present

## 2023-08-31 DIAGNOSIS — I951 Orthostatic hypotension: Secondary | ICD-10-CM | POA: Diagnosis not present

## 2023-08-31 MED ORDER — LOSARTAN POTASSIUM 25 MG PO TABS: 12.5000 mg | ORAL_TABLET | Freq: Every day | ORAL | 3 refills | Status: AC

## 2023-08-31 NOTE — Patient Instructions (Addendum)
 Medication Instructions:  Your physician has recommended you make the following change in your medication:  DECREASE LOSARTAN  TO 1/2 TABLET (12.5 MG) DAILY. IF BLOOD PRESSURE INCREASES THROUGH THE DAY YOU MAY TAKE THE OTHER HALF TABLET.   *If you need a refill on your cardiac medications before your next appointment, please call your pharmacy*  Lab Work: NONE If you have labs (blood work) drawn today and your tests are completely normal, you will receive your results only by: MyChart Message (if you have MyChart) OR A paper copy in the mail If you have any lab test that is abnormal or we need to change your treatment, we will call you to review the results.  Testing/Procedures: NONE  Follow-Up: At [facility], you and your health needs are our priority.  As part of our continuing mission to provide you with exceptional heart care, our providers are all part of one team.  This team includes your primary Cardiologist (physician) and Advanced Practice Providers or APPs (Physician Assistants and Nurse Practitioners) who all work together to provide you with the care you need, when you need it.  Your next appointment:   6 month(s)  Provider:   Vina Gull, MD  We recommend signing up for the patient portal called MyChart.  Sign up information is provided on this After Visit Summary.  MyChart is used to connect with patients for Virtual Visits (Telemedicine).  Patients are able to view lab/test results, encounter notes, upcoming appointments, etc.  Non-urgent messages can be sent to your provider as well.   To learn more about what you can do with MyChart, go to [email or web address].

## 2023-09-02 ENCOUNTER — Other Ambulatory Visit: Payer: Self-pay | Admitting: Family Medicine

## 2023-09-03 ENCOUNTER — Encounter: Payer: Self-pay | Admitting: Family Medicine

## 2023-09-03 ENCOUNTER — Telehealth: Payer: Self-pay

## 2023-09-03 ENCOUNTER — Ambulatory Visit (INDEPENDENT_AMBULATORY_CARE_PROVIDER_SITE_OTHER): Admitting: Family Medicine

## 2023-09-03 VITALS — BP 143/81 | HR 63 | Ht 70.0 in | Wt 178.0 lb

## 2023-09-03 DIAGNOSIS — G588 Other specified mononeuropathies: Secondary | ICD-10-CM | POA: Diagnosis not present

## 2023-09-03 DIAGNOSIS — D649 Anemia, unspecified: Secondary | ICD-10-CM | POA: Diagnosis not present

## 2023-09-03 DIAGNOSIS — G250 Essential tremor: Secondary | ICD-10-CM | POA: Diagnosis not present

## 2023-09-03 DIAGNOSIS — I951 Orthostatic hypotension: Secondary | ICD-10-CM | POA: Diagnosis not present

## 2023-09-03 DIAGNOSIS — G629 Polyneuropathy, unspecified: Secondary | ICD-10-CM | POA: Diagnosis not present

## 2023-09-03 DIAGNOSIS — E538 Deficiency of other specified B group vitamins: Secondary | ICD-10-CM

## 2023-09-03 DIAGNOSIS — J383 Other diseases of vocal cords: Secondary | ICD-10-CM | POA: Diagnosis not present

## 2023-09-03 NOTE — Assessment & Plan Note (Signed)
 Considering Botox injection as recommended by ENT (Dr. Okey) for voice issues. - Supported following specialist recommendation.

## 2023-09-03 NOTE — Progress Notes (Signed)
 Established Patient Office Visit  Subjective   Patient ID: [name], male    DOB: 1945/06/17  Age: 78 y.o. MRN: [id number]  Chief Complaint  Patient presents with   Medical Management of Chronic Issues    HPI  Subjective - Blood pressure remains labile, described as up and down. Experiences orthostatic hypotension with associated dizziness and lightheadedness on standing. Wears compression socks as of a few days ago, but finds them very difficult to put on. Experienced severe hip pain after straining to put on the compression socks. The pain was severe enough to impair walking and was partially relieved with topical Voltaren. Has a history of arthritis in the hip.  - Reports worsening essential tremor. Trying to increase primidone  dose as previously advised but has been hesitant due to concern it may worsen labile blood pressure.  - perineal pain remains the same. The pain is worse with prolonged sitting. Uses a cushion, which helps. Manages with lidocaine  cream and pregabalin. Other specialists have been hesitant to intervene near the implanted device due to risk of dislodging it. - Neuropathic symptoms in feet (stinging, burning) are mostly controlled. Experiences intermittent flare-ups at night, for which topical products provide some relief. - Reports a splinter in a finger for approximately one month. It is painful but has no signs of infection such as discharge. - Trigger finger was treated with a surgical release procedure after an injection was ineffective. This was the procedure that required pre-operative clearance. - Constipation is well-controlled with as-needed Metamucil. No issues since last report. - Considering Botox injection for vocal cords as recommended by Dr. Soldatova. Voice therapy provided some benefit. Finds it a strain to speak loudly and voice does not sound natural.  Medications Metoprolol 25 mg, currently taking half a tablet (12.5 mg) in the morning and a  second half as needed for elevated blood pressure. Primidone  250 mg in the morning and 100 mg (two 50 mg tablets) in the evening for essential tremor. Lyrica (pregabalin) 100 mg, self-tapered from 150 mg. Has an appointment with the prescribing PA at the end of the month to discuss the dose change and obtain a refill. Takes a multivitamin, iron supplement.  PMH, PSH, FH, Social Hx PMHx: Hypertension, orthostatic hypotension, essential tremor, hip arthritis, chronic buttock pain, peripheral neuropathy, trigger finger, constipation.   ROS Pertinent positives: labile blood pressure, orthostatic dizziness, lightheadedness, essential tremor, hip pain, buttock pain, intermittent peripheral neuropathy in feet, splinter pain. Pertinent negatives: No signs of infection at splinter site (no discharge).   The 10-year ASCVD risk score (Arnett DK, et al., 2019) is: 36.2%  Health Maintenance Due  Topic Date Due   Hepatitis C Screening  Never done   COVID-19 Vaccine (5 - Moderna risk 2024-25 season) 06/05/2023      Objective:     BP (!) 143/81   Pulse 63   Ht 5' 10 (1.778 m)   Wt 178 lb (80.7 kg)   SpO2 94%   BMI 25.54 kg/m    Physical Exam Gen: alert, oriented Pulm: no resp distress Ext: small splinter in the index finger. No evidence of infection     Assessment & Plan:   Pudendal neuralgia Assessment & Plan: Ongoing pain for two years, managed with pregabalin, topical lidocaine , and a seat cushion. - Continue current management. - Acknowledged risks associated with interventions near implanted device.   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 Cardiologist noted orthostasis  at a recent visit. Trialing compression stockings with difficulty in application causing secondary hip pain. - Continue metoprolol 12.5 mg in the morning, with an additional 12.5 mg PRN for elevated BP. - Advised on use of a sock aid device to ease application of compression stockings and avoid strain. Provided a picture of the recommended device.   Essential tremor Assessment & Plan: Worsening tremor. Hesitant to titrate primidone  up to 250 mg daily as previously recommended due to concerns about blood pressure side effects. - Continue current primidone  dose. - Encouraged to follow up with neurologist (Dr. Rosalynn) to discuss medication management.   Peripheral polyneuropathy Assessment & Plan:  Reports intermittent stinging and burning in feet. - Check Vitamin B12, folate, and iron panel today. - Will message with results and recommend B12 supplementation if indicated.   Spasmodic dysphonia Assessment & Plan: Considering Botox injection as recommended by ENT (Dr. Okey) for voice issues. - Supported following specialist recommendation.      Return in about 6 months (around 03/05/2024).    [name], MD

## 2023-09-03 NOTE — Assessment & Plan Note (Signed)
 History of fluctuating blood pressure and symptoms of dizziness/lightheadedness upon standing, consistent with orthostasis. Cardiologist noted orthostasis at a recent visit. Trialing compression stockings with difficulty in application causing secondary hip pain. - Continue metoprolol 12.5 mg in the morning, with an additional 12.5 mg PRN for elevated BP. - Advised on use of a sock aid device to ease application of compression stockings and avoid strain. Provided a picture of the recommended device.

## 2023-09-03 NOTE — Assessment & Plan Note (Addendum)
 Worsening tremor. Currently taking 250 am, 100 pm.   - Continue current primidone  dose. -  follow up with neurologist to discuss medication management.

## 2023-09-03 NOTE — Telephone Encounter (Signed)
 Patient wanted to ask you about referring him to another cardiologist within the [facility] system he stated that the one he has is not consistent

## 2023-09-03 NOTE — Patient Instructions (Signed)
 It was nice to see you today,  WeAddressed the following topics today: -I am going to check your B12 and iron levels.  I will let you know the results when I get them. - I have included a picture of the compression sock assistive device below.  Have a great day,  Rolan Slain, MD

## 2023-09-03 NOTE — Assessment & Plan Note (Signed)
 Ongoing pain for two years, managed with pregabalin, topical lidocaine , and a seat cushion. - Continue current management. - Acknowledged risks associated with interventions near implanted device.

## 2023-09-03 NOTE — Assessment & Plan Note (Signed)
 Reports intermittent stinging and burning in feet. - Check Vitamin B12, folate, and iron panel today. - Will message with results and recommend B12 supplementation if indicated.

## 2023-09-04 ENCOUNTER — Ambulatory Visit: Payer: Self-pay | Admitting: Family Medicine

## 2023-09-04 LAB — IRON,TIBC AND FERRITIN PANEL
Ferritin: 219 ng/mL (ref 30–400)
Iron Saturation: 39 % (ref 15–55)
Iron: 119 ug/dL (ref 38–169)
Total Iron Binding Capacity: 308 ug/dL (ref 250–450)
UIBC: 189 ug/dL (ref 111–343)

## 2023-09-04 LAB — B12 AND FOLATE PANEL
Folate: >20 ng/mL (ref >3.0)
Vitamin B-12: 666 pg/mL (ref 232–1245)

## 2023-09-04 LAB — HOMOCYSTEINE: Homocysteine: 8.5 umol/L (ref 0.0–19.2)

## 2023-09-05 NOTE — Telephone Encounter (Signed)
 Let the patient know all the cardiologists in the [facility] are under the same practice so he would need to ask the cardiologist's office directly if he could switch to a different provider in the system.  They shouldn't need a referral from me.

## 2023-09-07 ENCOUNTER — Telehealth: Payer: Self-pay

## 2023-09-07 NOTE — Telephone Encounter (Signed)
 LVM for pt to call the office to inform him about below and will also send a mychart message.

## 2023-09-07 NOTE — Telephone Encounter (Signed)
 Copied from CRM [id number]. Topic: Clinical - Lab/Test Results >> Sep 07, 2023  8:41 AM Powell HERO wrote: Reason for CRM: Patient returned missed call. Was able to relay lab results, patient verbalized understanding. No further questions.

## 2023-09-11 ENCOUNTER — Other Ambulatory Visit: Payer: Self-pay | Admitting: Family Medicine

## 2023-09-11 MED ORDER — ROSUVASTATIN CALCIUM 10 MG PO TABS: 10.0000 mg | ORAL_TABLET | Freq: Every day | ORAL | 3 refills | Status: DC

## 2023-09-11 NOTE — Telephone Encounter (Signed)
 Copied from CRM [id number]. Topic: Clinical - Medication Refill >> Sep 11, 2023 10:55 AM Larissa S wrote: Medication: rosuvastatin  (CRESTOR ) 10 MG tablet, 90 day supply   Has the patient contacted their pharmacy? Yes (Agent: If no, request that the patient contact the pharmacy for the refill. If patient does not wish to contact the pharmacy document the reason why and proceed with request.) (Agent: If yes, when and what did the pharmacy advise?)  This is the patient's preferred pharmacy:  [facility] [id number] - [place], [place] - [address] [place] [address] Phone: [phone] Fax: [phone]  Is this the correct pharmacy for this prescription? Yes If no, delete pharmacy and type the correct one.   Has the prescription been filled recently? No  Is the patient out of the medication? Yes  Has the patient been seen for an appointment in the last year OR does the patient have an upcoming appointment? Yes  Can we respond through MyChart? No  Agent: Please be advised that Rx refills may take up to 3 business days. We ask that you follow-up with your pharmacy.

## 2023-09-25 DIAGNOSIS — G894 Chronic pain syndrome: Secondary | ICD-10-CM | POA: Diagnosis not present

## 2023-09-25 DIAGNOSIS — G588 Other specified mononeuropathies: Secondary | ICD-10-CM | POA: Diagnosis not present

## 2023-09-29 ENCOUNTER — Other Ambulatory Visit: Payer: Self-pay | Admitting: Family Medicine

## 2023-10-01 ENCOUNTER — Inpatient Hospital Stay: Payer: Medicare Other | Attending: Physician Assistant

## 2023-10-01 DIAGNOSIS — Z9079 Acquired absence of other genital organ(s): Secondary | ICD-10-CM | POA: Diagnosis not present

## 2023-10-01 DIAGNOSIS — C61 Malignant neoplasm of prostate: Secondary | ICD-10-CM | POA: Diagnosis not present

## 2023-10-01 LAB — CMP (CANCER CENTER ONLY)
ALT: 14 U/L (ref 0–44)
AST: 20 U/L (ref 15–41)
Albumin: 4.4 g/dL (ref 3.5–5.0)
Alkaline Phosphatase: 60 U/L (ref 38–126)
Anion gap: 3 — ABNORMAL LOW (ref 5–15)
BUN: 19 mg/dL (ref 8–23)
CO2: 32 mmol/L (ref 22–32)
Calcium: 10 mg/dL (ref 8.9–10.3)
Chloride: 104 mmol/L (ref 98–111)
Creatinine: 0.85 mg/dL (ref 0.61–1.24)
GFR, Estimated: >60 mL/min (ref >60)
Glucose, Bld: 85 mg/dL (ref 70–99)
Potassium: 4.3 mmol/L (ref 3.5–5.1)
Sodium: 139 mmol/L (ref 135–145)
Total Bilirubin: 0.4 mg/dL (ref 0.0–1.2)
Total Protein: 7.2 g/dL (ref 6.5–8.1)

## 2023-10-01 LAB — CBC WITH DIFFERENTIAL (CANCER CENTER ONLY)
Abs Immature Granulocytes: 0.01 K/uL (ref 0.00–0.07)
Basophils Absolute: 0.1 K/uL (ref 0.0–0.1)
Basophils Relative: 1 %
Eosinophils Absolute: 0.2 K/uL (ref 0.0–0.5)
Eosinophils Relative: 4 %
HCT: 39.3 % (ref 39.0–52.0)
Hemoglobin: 13.2 g/dL (ref 13.0–17.0)
Immature Granulocytes: 0 %
Lymphocytes Relative: 23 %
Lymphs Abs: 1.2 K/uL (ref 0.7–4.0)
MCH: 29.6 pg (ref 26.0–34.0)
MCHC: 33.6 g/dL (ref 30.0–36.0)
MCV: 88.1 fL (ref 80.0–100.0)
Monocytes Absolute: 0.5 K/uL (ref 0.1–1.0)
Monocytes Relative: 10 %
Neutro Abs: 3.2 K/uL (ref 1.7–7.7)
Neutrophils Relative %: 62 %
Platelet Count: 173 K/uL (ref 150–400)
RBC: 4.46 MIL/uL (ref 4.22–5.81)
RDW: 13.3 % (ref 11.5–15.5)
WBC Count: 5.2 K/uL (ref 4.0–10.5)
nRBC: 0 % (ref 0.0–0.2)

## 2023-10-02 LAB — TESTOSTERONE: Testosterone: 66 ng/dL — ABNORMAL LOW (ref 264–916)

## 2023-10-02 LAB — PROSTATE-SPECIFIC AG, SERUM (LABCORP): Prostate Specific Ag, Serum: <0.1 ng/mL (ref 0.0–4.0)

## 2023-10-05 ENCOUNTER — Other Ambulatory Visit: Payer: Self-pay | Admitting: Family Medicine

## 2023-10-05 MED ORDER — ZOLPIDEM TARTRATE ER 12.5 MG PO TBCR: 12.5000 mg | EXTENDED_RELEASE_TABLET | Freq: Every evening | ORAL | 2 refills | Status: DC | PRN

## 2023-10-06 ENCOUNTER — Ambulatory Visit: Payer: Self-pay

## 2023-10-06 NOTE — Telephone Encounter (Signed)
 Pt advised with VU

## 2023-10-06 NOTE — Telephone Encounter (Signed)
-----   Message from [place] [employer] [employer] sent at 10/05/2023 11:25 PM EDT ----- Please notify patient that PSA is normal, <0.1.  ----- Message ----- From: Interface, Lab In [place] Sent: 10/01/2023  10:52 AM EDT To: [place] [employer], PA-C

## 2023-10-12 NOTE — Progress Notes (Unsigned)
 Assessment/Plan:    1.  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  Patient and I again discussed all of his options, medical, surgical and [place] [facility].  He asked many questions and I answered those to the best of my ability.  He does not think that he wants to undergo focused ultrasound or DBS, but knows that he would not get adequate control on tremor without surgical intervention.  He has read the literature and asked some questions again about it today.  He asked for literature on [place] [facility] and this was given today.   He is currently on primidone , 250 mg in the morning and 100 mg at bed.  He wants to stay on that for now.  Unfortunately, he really does not have many more medication options.  2.  Spasmodic dysphonia  -Discussed nature and pathophysiology.  Discussed role of Botox.  Wants to hold on ENT referral  -He has seen and done speech therapy for hypophonia and for spasmodic dysphonia.  -Has seen ENT with Dr. Soldatova, who noted vocal atrophy and also recommended Botox. 3.  Severe scrotal/perineal pain  -Developed after a surgery for an artificial urinary sphincter.  Patient is following with urology.  Had hypogastric nerve ablation done by interventional radiology but it made it worse and now following with Dr. Darlis and pelvic floor physical therapy.  Is now on Lyrica, and the patient does not particularly love the medication.  He asks if there are alternative homeopathic remedies, but I told him this would really be out of my field of expertise.  4..  B12 deficiency  -pt with evidence of B12 deficiency. He started a supplement in May after showing low b12 but he needs to increase the dose to 1000mcg  5..  Memory change  - Had neurocognitive testing with Dr. Gayland June, 2025 that was unremarkable.  6.  Orthostatic hypotension  - Follows with cardiology and primary care.  Cardiology had asked me to address relationship to primidone .  I do not think that  the primidone  is likely causing any significant drops in blood pressure.  Patient does not have any neurodegenerative condition that should cause orthostasis.  Primidone  has barbituate qualities and certainly could potentiate hypotension when combined with things like benzodiazepines, but I have not really had orthostatic hypotension in patients who are on this for tremor.  He is still on losartan .  I told him I am happy to get him off of the medication and see how he did if he would like, but I am not convinced that this is the sole etiology.  He reports that he has actually been doing well in terms of blood pressure for a little while, without any change in the primidone  dose, so he has decided to stay on the primidone .  7.  At this point in time, the patient does not want to change medication.  He and I discussed following up on an as-needed basis.  He and I are going to see if his primary care will refill his medicines.  I did refill his primidone  today for 1 year.  Subjective:   [name] was seen today in follow up for essential tremor.  My previous records were reviewed prior to todays visit.  Wife present today and supplements history.  Patient is currently on primidone .  I did get a message from his cardiology PA in July asking me to address the primidone  as they thought that it was contributing  to orthostatic hypotension.  I did email back and noted that I have not had that be an issue in the past and the patient does not have a neurodegenerative condition like Parkinson's disease.  I did tell them that I could wean him off of that if necessary, but I did not hear anything further.  BP has been overall good but occasionally it will drop down.  Patient reports today that his tremor is getting worse, L more than R but there are times when he cannot write but sometimes he can.  He eats more with a spoon than fork.  He did have trigger finger surgery recently.  No falls.  Current prescribed  movement disorder medications: Primidone , 250 mg in the morning and 50 mg at bedtime (now on 100 at night) Ativan  0.5 mg, half tablet daily  Prior medications: Primidone , 250 mg   ALLERGIES:   Allergies  Allergen Reactions   Imipramine Other (See Comments)   Lisinopril -Hydrochlorothiazide      Other reaction(s): cough?   Meloxicam     Other reaction(s): rash?... Able to tolerate Aleve without problems   Tape Other (See Comments) and Rash    Surgical tape=redness/rash at site    CURRENT MEDICATIONS:  Outpatient Encounter Medications as of 10/13/2023  Medication Sig   acetaminophen  (TYLENOL ) 325 MG tablet Take 325 mg by mouth every 6 (six) hours as needed for mild pain, moderate pain, fever or headache.   b complex vitamins capsule Take 1 capsule by mouth daily.   bisacodyl  (DULCOLAX) 10 MG suppository Place 1 suppository (10 mg total) rectally as needed for moderate constipation.   Cholecalciferol  (VITAMIN D -3) 25 MCG (1000 UT) CAPS Take by mouth.   LORazepam  (ATIVAN ) 0.5 MG tablet TAKE 1 TABLET BY MOUTH DAILY AS NEEDED FOR ANXIETY AS DIRECTED   losartan  (COZAAR ) 25 MG tablet Take 0.5 tablets (12.5 mg total) by mouth daily. IF BLOOD PRESSURE INCREASES THROUGH THE DAY YOU MAY TAKE THE OTHER HALF TABLET.   magnesium  (MAGTAB) 84 MG ( ) TBCR SR tablet Take 84 mg by mouth.   oxyCODONE  (OXY IR/ROXICODONE ) 5 MG immediate release tablet Take 1 tablet every 6 hours by oral route for 3 days.   pregabalin (LYRICA) 150 MG capsule Take 150 mg by mouth in the morning, at noon, and at bedtime.   primidone  (MYSOLINE ) 250 MG tablet TAKE 1 TABLET BY MOUTH EVERY MORNING   primidone  (MYSOLINE ) 50 MG tablet TAKE 5 TABLETS BY MOUTH IN THE MORNING AND 1 TABLET IN THE EVENING   psyllium (REGULOID) 0.52 g capsule Take 0.52 g by mouth as needed. 2 capsules by mouth twice daily   rosuvastatin  (CRESTOR ) 10 MG tablet Take 1 tablet (10 mg total) by mouth daily.   triamcinolone  cream (KENALOG ) 0.5 % Apply 1  Application topically 2 (two) times daily.   vitamin B-12 (CYANOCOBALAMIN ) 500 MCG tablet Take 500 mcg by mouth daily.   zolpidem  (AMBIEN  CR) 12.5 MG CR tablet Take 1 tablet (12.5 mg total) by mouth at bedtime as needed for sleep.   No facility-administered encounter medications on file as of 10/13/2023.     Objective:    PHYSICAL EXAMINATION:    VITALS:   Vitals:   10/13/23 1456  BP: 132/76  Pulse: 64  SpO2: 98%  Weight: 181 lb (82.1 kg)  Height: 6' (1.829 m)     GEN:  The patient appears stated age and is in NAD. HEENT:  Normocephalic, atraumatic.  The mucous membranes are moist. The superficial temporal  arteries are without ropiness or tenderness. CV:  brady.  regular Lungs:  CTAB Neck/HEME:  There are no carotid bruits bilaterally.  Neurological examination:  Orientation: The patient is alert and oriented x3.  He asked detailed and appropriate questions. Cranial nerves: There is good facial symmetry. The speech is fluent, but not particularly hypophonic with evidence of spasmodic dysphonia.  This is stable from prior.  Soft palate rises symmetrically and there is no tongue deviation. Hearing is intact to conversational tone. Sensation: Sensation is intact to light touch throughout Motor: Strength is at least antigravity x4.  Movement examination: Tone: There is normal tone in the UE/LE Abnormal movements: There is just mild tremor of the outstretched hands.  There is mild to moderate tremor with intention on the left.    I have reviewed and interpreted the following labs independently   Chemistry      Component Value Date/Time   NA 139 10/01/2023 1033   NA 139 07/28/2023 1313   NA 141 01/06/2017 0820   K 4.3 10/01/2023 1033   K 4.2 01/06/2017 0820   CL 104 10/01/2023 1033   CO2 32 10/01/2023 1033   CO2 26 01/06/2017 0820   BUN 19 10/01/2023 1033   BUN 21 07/28/2023 1313   BUN 28.9 (H) 01/06/2017 0820   CREATININE 0.85 10/01/2023 1033   CREATININE 1.1  01/06/2017 0820      Component Value Date/Time   CALCIUM  10.0 10/01/2023 1033   CALCIUM  9.7 01/06/2017 0820   ALKPHOS 60 10/01/2023 1033   ALKPHOS 41 01/06/2017 0820   AST 20 10/01/2023 1033   AST 19 01/06/2017 0820   ALT 14 10/01/2023 1033   ALT 16 01/06/2017 0820   BILITOT 0.4 10/01/2023 1033   BILITOT 0.43 01/06/2017 0820      Lab Results  Component Value Date   WBC 5.2 10/01/2023   HGB 13.2 10/01/2023   HCT 39.3 10/01/2023   MCV 88.1 10/01/2023   PLT 173 10/01/2023   Lab Results  Component Value Date   TSH 3.740 07/28/2023     Chemistry      Component Value Date/Time   NA 139 10/01/2023 1033   NA 139 07/28/2023 1313   NA 141 01/06/2017 0820   K 4.3 10/01/2023 1033   K 4.2 01/06/2017 0820   CL 104 10/01/2023 1033   CO2 32 10/01/2023 1033   CO2 26 01/06/2017 0820   BUN 19 10/01/2023 1033   BUN 21 07/28/2023 1313   BUN 28.9 (H) 01/06/2017 0820   CREATININE 0.85 10/01/2023 1033   CREATININE 1.1 01/06/2017 0820      Component Value Date/Time   CALCIUM  10.0 10/01/2023 1033   CALCIUM  9.7 01/06/2017 0820   ALKPHOS 60 10/01/2023 1033   ALKPHOS 41 01/06/2017 0820   AST 20 10/01/2023 1033   AST 19 01/06/2017 0820   ALT 14 10/01/2023 1033   ALT 16 01/06/2017 0820   BILITOT 0.4 10/01/2023 1033   BILITOT 0.43 01/06/2017 0820     Lab Results  Component Value Date   VITAMINB12 666 09/03/2023   Lab Results  Component Value Date   TSH 3.740 07/28/2023    Total time spent on today's visit was 40 minutes, including both face-to-face time and nonface-to-face time.  Time included that spent on review of records (prior notes available to me/labs/imaging if pertinent), discussing treatment and goals, answering patient's questions and coordinating care.     Cc:  Chandra Toribio POUR, MD

## 2023-10-13 ENCOUNTER — Ambulatory Visit (INDEPENDENT_AMBULATORY_CARE_PROVIDER_SITE_OTHER): Admitting: Neurology

## 2023-10-13 DIAGNOSIS — G250 Essential tremor: Secondary | ICD-10-CM

## 2023-10-13 MED ORDER — PRIMIDONE 250 MG PO TABS
250.0000 mg | ORAL_TABLET | Freq: Every morning | ORAL | 3 refills | Status: AC
Start: 2023-10-13 — End: ?

## 2023-10-27 ENCOUNTER — Telehealth: Payer: Self-pay

## 2023-10-27 ENCOUNTER — Other Ambulatory Visit: Payer: Self-pay

## 2023-10-27 DIAGNOSIS — I951 Orthostatic hypotension: Secondary | ICD-10-CM

## 2023-10-27 DIAGNOSIS — E785 Hyperlipidemia, unspecified: Secondary | ICD-10-CM

## 2023-10-27 NOTE — Telephone Encounter (Signed)
 Copied from CRM #[id number]. Topic: Referral - Request for Referral >> Oct 27, 2023 10:28 AM Amy B wrote: Did the patient discuss referral with their provider in the last year? No (If No - schedule appointment) (If Yes - send message)  Appointment offered? No  Type of order/referral and detailed reason for visit: Requests referral to new [facility] Center as current cardiologist has retired.  Preference of office, provider, location: [facility]  If referral order, have you been seen by this specialty before? Yes (If Yes, this issue or another issue? When? Where?  Can we respond through MyChart? Yes

## 2023-10-30 ENCOUNTER — Other Ambulatory Visit: Payer: Self-pay | Admitting: Neurology

## 2023-10-30 DIAGNOSIS — G250 Essential tremor: Secondary | ICD-10-CM

## 2023-11-02 ENCOUNTER — Ambulatory Visit: Admitting: Neurology

## 2023-12-11 DIAGNOSIS — Z23 Encounter for immunization: Secondary | ICD-10-CM | POA: Diagnosis not present

## 2023-12-16 DIAGNOSIS — M25562 Pain in left knee: Secondary | ICD-10-CM | POA: Diagnosis not present

## 2024-01-05 ENCOUNTER — Encounter: Payer: Self-pay | Admitting: Internal Medicine

## 2024-01-05 ENCOUNTER — Ambulatory Visit: Attending: Internal Medicine | Admitting: Internal Medicine

## 2024-01-05 VITALS — BP 130/86 | HR 65 | Ht 71.0 in | Wt 180.8 lb

## 2024-01-05 DIAGNOSIS — R42 Dizziness and giddiness: Secondary | ICD-10-CM | POA: Diagnosis present

## 2024-01-05 NOTE — Patient Instructions (Signed)
 Medication Instructions:  Your physician recommends that you continue on your current medications as directed. Please refer to the Current Medication list given to you today.  *If you need a refill on your cardiac medications before your next appointment, please call your pharmacy*  Follow-Up: At [facility], you and your health needs are our priority.  As part of our continuing mission to provide you with exceptional heart care, our providers are all part of one team.  This team includes your primary Cardiologist (physician) and Advanced Practice Providers or APPs (Physician Assistants and Nurse Practitioners) who all work together to provide you with the care you need, when you need it.  Your next appointment:   6-7 month(s)  Provider:   Vina Gull, MD   We recommend signing up for the patient portal called MyChart.  Sign up information is provided on this After Visit Summary.  MyChart is used to connect with patients for Virtual Visits (Telemedicine).  Patients are able to view lab/test results, encounter notes, upcoming appointments, etc.  Non-urgent messages can be sent to your provider as well.    To learn more about what you can do with MyChart, go to [email or web address].

## 2024-01-05 NOTE — Progress Notes (Unsigned)
 Cardiology Office Note   Date:  01/05/2024   ID:  [name] 17, 1947, MRN [id number]  PCP:  Chandra Toribio POUR, MD  Cardiologist:   Vina Gull, MD   F/U of HTN     History of Present Illness: [name] is a 78 y.o. male with a history of HTN, chest pain, hyperlipidemia  Also a hx of CAD on CT scans .The pt is also followed in urology for incontinence Hx of syncope in past with meds  since D/C'd  I saw the pt in 2023 May 2025  Pt had dizziness, orthostatic hypotension   Amlodipoine stopped    June 2025  Very orthostatic   Losartan  d/c'd      Started back on lower dose  Not pt on primodone   Seen by CHRISTELLA Pavy in July 2025   Still with some dizziness  Since seen he denies CP  Breathing is ok  Still wit hdizziness  Brings in BP readings  100s to 150s  Labile No syncope   Drinkng fluids  Urine is pale  Frustrated by progressive essential tremor   Would likke to look into further treatment options   See Dr Tobie Leonia JINNY Levels    Current Meds  Medication Sig   acetaminophen  (TYLENOL ) 325 MG tablet Take 325 mg by mouth every 6 (six) hours as needed for mild pain, moderate pain, fever or headache.   bisacodyl  (DULCOLAX) 10 MG suppository Place 1 suppository (10 mg total) rectally as needed for moderate constipation.   LORazepam  (ATIVAN ) 0.5 MG tablet TAKE 1 TABLET BY MOUTH DAILY AS NEEDED FOR ANXIETY AS DIRECTED   losartan  (COZAAR ) 25 MG tablet Take 0.5 tablets (12.5 mg total) by mouth daily. IF BLOOD PRESSURE INCREASES THROUGH THE DAY YOU MAY TAKE THE OTHER HALF TABLET.   magnesium  (MAGTAB) 84 MG ( ) TBCR SR tablet Take 84 mg by mouth.   Multiple Vitamins-Minerals (CENTRUM SILVER 50+MEN) TABS Take by mouth.   pregabalin (LYRICA) 100 MG capsule Take 100 mg by mouth 3 (three) times daily.   primidone  (MYSOLINE ) 250 MG tablet Take 1 tablet (250 mg total) by mouth every morning.   primidone  (MYSOLINE ) 50 MG tablet TAKE 5 TABLETS BY MOUTH EVERY MORNING AND 1 TAKE IN THE  EVENING (Patient taking differently: Take 50 mg by mouth at bedtime. Per patient)   rosuvastatin  (CRESTOR ) 10 MG tablet Take 1 tablet (10 mg total) by mouth daily.   zolpidem  (AMBIEN  CR) 12.5 MG CR tablet Take 1 tablet (12.5 mg total) by mouth at bedtime as needed for sleep.     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 OR;  Service: Neurosurgery;  Laterality: Left;   OTHER SURGICAL HISTORY  2005   right hand middle finger surgery    REPAIR OF CEREBROSPINAL FLUID LEAK N/A 05/18/2018   Procedure: Exploration  of Lumbar four-five with repair of cerebrospinal fluid leak;  Surgeon: Unice Pac, MD;  Location: [facility] OR;  Service: Neurosurgery;  Laterality: N/A;   ROBOT ASSISTED LAPAROSCOPIC RADICAL PROSTATECTOMY  01/13/2011   Procedure: ROBOTIC ASSISTED LAPAROSCOPIC RADICAL PROSTATECTOMY LEVEL 2;  Surgeon: Noretta Ferrara, MD;  Location: [facility] ORS;  Service: Urology;  Laterality: Bilateral;  Robotic Assisted Laparoscopic Prostatetectomy with Bilateral Lymphadenectomy  Level 2     Social History:  The patient  reports that he has never smoked. He has never used smokeless tobacco. He reports current alcohol use. He reports that he does not use drugs.   Family History:  The patient's family history includes Cancer in his mother; Healthy in his child; Heart attack in his father; Hypertension in his father and sister.    ROS:  Please  see the history of present illness. All other systems are reviewed and  Negative to the above problem except as noted.    PHYSICAL EXAM: VS:  BP 130/86 (BP Location: Right Arm, Patient Position: Sitting, Cuff Size: Normal)   Pulse 65   Ht 5' 11 (1.803 m)   Wt 180 lb 12.8 oz (82 kg)   SpO2 97%   BMI 25.22 kg/m   GEN Pt is in no acute distress  HEENT: normal  Neck: no JVD, carotid bruits Cardiac: RRR; no murmurs,   No  LE dema  Respiratory:  clear to auscultation bilaterally, GI: soft, nontender, nondistended, + BS  No hepatomegaly  MS: no deformity Moving all extremities   Skin: warm and dry, no rash Neuro:  Strength and sensation are intact Psych: euthymic mood, full affect   EKG:  EKG is not ordered today.   NSR 65 bpm  Lipid Panel    Component Value Date/Time   CHOL 133 05/04/2023 1144   TRIG 167 (H) 05/04/2023 1144   HDL 39 (L) 05/04/2023 1144   CHOLHDL 3.4 05/04/2023 1144   CHOLHDL 2.9 05/25/2015 0857   VLDL 15 05/25/2015 0857   LDLCALC 66 05/04/2023 1144      Wt Readings from Last 3 Encounters:  01/05/24 180 lb 12.8 oz (82 kg)  10/13/23 181 lb (82.1 kg)  09/03/23 178 lb (80.7 kg)      ASSESSMENT AND PLAN:  1  Blood pressure BP is controlled oncurrent regimen   Would continue     2  HL LDL is 57  HDL is 41  Trig 161   Continue current regimen  3.  CAD.  CAD on CT scan   Remains asymptomatic   Current medicines are reviewed at length with the patient today.  The patient does not have concerns regarding medicines.  Signed, Vina Gull, MD  01/05/2024 9:47 AM    [facility] [address] [place], [place], [place]  [address] Phone: [phone]; Fax: [phone]

## 2024-01-11 ENCOUNTER — Other Ambulatory Visit: Payer: Self-pay | Admitting: Family Medicine

## 2024-01-11 MED ORDER — ROSUVASTATIN CALCIUM 10 MG PO TABS: 10.0000 mg | ORAL_TABLET | Freq: Every day | ORAL | 3 refills | Status: AC

## 2024-01-20 DIAGNOSIS — M25562 Pain in left knee: Secondary | ICD-10-CM | POA: Diagnosis not present

## 2024-01-28 DIAGNOSIS — L820 Inflamed seborrheic keratosis: Secondary | ICD-10-CM | POA: Diagnosis not present

## 2024-01-28 DIAGNOSIS — L570 Actinic keratosis: Secondary | ICD-10-CM | POA: Diagnosis not present

## 2024-01-28 DIAGNOSIS — L812 Freckles: Secondary | ICD-10-CM | POA: Diagnosis not present

## 2024-01-28 DIAGNOSIS — D485 Neoplasm of uncertain behavior of skin: Secondary | ICD-10-CM | POA: Diagnosis not present

## 2024-01-28 DIAGNOSIS — D1801 Hemangioma of skin and subcutaneous tissue: Secondary | ICD-10-CM | POA: Diagnosis not present

## 2024-01-28 DIAGNOSIS — L821 Other seborrheic keratosis: Secondary | ICD-10-CM | POA: Diagnosis not present

## 2024-01-28 DIAGNOSIS — M1712 Unilateral primary osteoarthritis, left knee: Secondary | ICD-10-CM | POA: Diagnosis not present

## 2024-01-28 DIAGNOSIS — C44519 Basal cell carcinoma of skin of other part of trunk: Secondary | ICD-10-CM | POA: Diagnosis not present

## 2024-01-28 DIAGNOSIS — Z85828 Personal history of other malignant neoplasm of skin: Secondary | ICD-10-CM | POA: Diagnosis not present

## 2024-02-01 ENCOUNTER — Encounter: Payer: Self-pay | Admitting: Internal Medicine

## 2024-02-04 ENCOUNTER — Telehealth: Payer: Self-pay | Admitting: Neurology

## 2024-02-04 ENCOUNTER — Other Ambulatory Visit: Payer: Self-pay | Admitting: Hematology and Oncology

## 2024-02-04 DIAGNOSIS — C61 Malignant neoplasm of prostate: Secondary | ICD-10-CM

## 2024-02-04 NOTE — Telephone Encounter (Signed)
 Did you want to try the [facility] watch or another alternative that you have discussed with this patient?

## 2024-02-04 NOTE — Telephone Encounter (Signed)
 Pt would like to try different Rx than primidone  , other alternatives like the vibrating ball or other devices to help with tremors

## 2024-02-05 ENCOUNTER — Telehealth: Payer: Self-pay | Admitting: Internal Medicine

## 2024-02-05 ENCOUNTER — Other Ambulatory Visit: Payer: Self-pay

## 2024-02-05 MED ORDER — AMLODIPINE BESYLATE 2.5 MG PO TABS: 2.5000 mg | ORAL_TABLET | Freq: Every day | ORAL | 1 refills | Status: DC

## 2024-02-05 NOTE — Telephone Encounter (Signed)
SPoke to patient.

## 2024-03-07 ENCOUNTER — Encounter: Payer: Self-pay | Admitting: Family Medicine

## 2024-03-07 ENCOUNTER — Ambulatory Visit: Admitting: Family Medicine

## 2024-03-07 VITALS — BP 137/70 | HR 90 | Ht 71.0 in | Wt 183.0 lb

## 2024-03-07 DIAGNOSIS — G629 Polyneuropathy, unspecified: Secondary | ICD-10-CM

## 2024-03-07 DIAGNOSIS — S838X9A Sprain of other specified parts of unspecified knee, initial encounter: Secondary | ICD-10-CM | POA: Insufficient documentation

## 2024-03-07 DIAGNOSIS — G250 Essential tremor: Secondary | ICD-10-CM

## 2024-03-07 DIAGNOSIS — E785 Hyperlipidemia, unspecified: Secondary | ICD-10-CM | POA: Diagnosis not present

## 2024-03-07 DIAGNOSIS — G2581 Restless legs syndrome: Secondary | ICD-10-CM

## 2024-03-07 DIAGNOSIS — E538 Deficiency of other specified B group vitamins: Secondary | ICD-10-CM

## 2024-03-07 DIAGNOSIS — S838X2D Sprain of other specified parts of left knee, subsequent encounter: Secondary | ICD-10-CM | POA: Diagnosis not present

## 2024-03-07 DIAGNOSIS — Z131 Encounter for screening for diabetes mellitus: Secondary | ICD-10-CM | POA: Diagnosis not present

## 2024-03-07 DIAGNOSIS — I10 Essential (primary) hypertension: Secondary | ICD-10-CM | POA: Diagnosis not present

## 2024-03-07 NOTE — Assessment & Plan Note (Signed)
 Well-controlled with losartan . Blood pressure around 130/70 mmHg. Now taking full tablet. Not taking amlodipine  - Continue losartan  as prescribed.

## 2024-03-07 NOTE — Assessment & Plan Note (Signed)
 Symptoms controlled with magnesium  supplements and topical treatments. - Continue current management with magnesium  supplements and topical treatments.

## 2024-03-07 NOTE — Assessment & Plan Note (Signed)
 Most recent value normal, prior value ~ 250.  Currently taking MV and B complex.  Will recheck b12 at next lab visit.

## 2024-03-07 NOTE — Progress Notes (Signed)
" ° °  Established Patient Office Visit  Subjective   Patient ID: [name], male    DOB: [birth year]  Age: 79 y.o. MRN: [id number]  Chief Complaint  Patient presents with   Medical Management of Chronic Issues     History of Present Illness   The patient presents with knee pain and a history of torn meniscus and arthritis.  Knee pain started in early October and was severe for 1 to 2 months. A corticosteroid injection gave relief for 1 to 2 weeks, then the pain returned. X-ray was unremarkable, but MRI showed a torn meniscus  and arthritis. Pain is now manageable unless he overexerts. He wants physical therapy and needs a nearby facility due to transportation challenges. He had reduced driving because of knee pain but is now driving short distances.  He takes losartan  for blood pressure with recent readings around 130/70 mmHg. He takes primidone  for tremors, 2 tablets of 50 mg at night and 4 or 5 during the day.  He has burning and restless sensations in his feet at night. Magnesium , bananas, and topical treatments (Nervive or Blue Emu) provide partial relief.  He takes a multivitamin and a B complex with B12. He is worried about insurance not covering B12 testing based on prior experience.          The 10-year ASCVD risk score (Arnett DK, et al., 2019) is: 34%  Health Maintenance Due  Topic Date Due   Hepatitis C Screening  Never done   COVID-19 Vaccine (5 - 2025-26 season) 10/26/2023   Medicare Annual Wellness (AWV)  03/09/2024      Objective:     BP 137/70   Pulse 90   Ht 5' 11 (1.803 m)   Wt 183 lb (83 kg)   SpO2 96%   BMI 25.52 kg/m    Physical Exam     Gen: alert, oriented Pulm: no respiratory distress Neuro: kinetic tremor in hands.  Vocal dysphonia.  Psych: pleasant affect       No results found for any visits on 03/07/24.      Assessment & Plan:   Injury of meniscus of left knee, subsequent encounter Assessment & Plan: Chronic pain due to  meniscal tear and osteoarthritis. MRI confirmed diagnosis. Orthopedic surgeon advised against major surgery, suggested arthroscopy if pain persists. Discussed physical therapy as first-line treatment. - discussed referral to physical therapy at [facility] on [address]. - Monitor symptoms, consider arthroscopy if pain persists.   Essential tremor Assessment & Plan: Chronic tremor managed with primidone . Discussed high-intensity focused ultrasound as non-surgical option. He is open to exploring candidacy. - Provided contact information for [facility] to assess candidacy for high-intensity focused ultrasound. - Continue current primidone  regimen.   Primary hypertension Assessment & Plan: Well-controlled with losartan . Blood pressure around 130/70 mmHg. Now taking full tablet. Not taking amlodipine  - Continue losartan  as prescribed.   Restless leg Assessment & Plan: Symptoms controlled with magnesium  supplements and topical treatments. - Continue current management with magnesium  supplements and topical treatments.   Low serum vitamin B12 Assessment & Plan: Most recent value normal, prior value ~ 250.  Currently taking MV and B complex.  Will recheck b12 at next lab visit.            Return in about 6 months (around 09/04/2024) for f/u chronic issues/physical.    [name], MD  "

## 2024-03-07 NOTE — Patient Instructions (Addendum)
 It was nice to see you today,  We addressed the following topics today:  - the phone number to discuss the High Intensity Focused Ultrasound with [facility] is: [phone].   - I have included a picture of a device you can buy that may help with restless leg at night. They alternate squeezing the leg muscles while you sleep. You can get them online or at most medical supply store without a prescription.   Message from Dr. Collie office: Dr. Evonnie said we can send in a RX for [facility] she said she has talked with you about this in the past. It is the watch you wear and it helps to control tremors. I would need you to come by at your convince to sign some paperwork and can get that sent in for approval from your insurance.   Have a great day,  Rolan Slain, MD

## 2024-03-07 NOTE — Assessment & Plan Note (Signed)
 Chronic pain due to meniscal tear and osteoarthritis. MRI confirmed diagnosis. Orthopedic surgeon advised against major surgery, suggested arthroscopy if pain persists. Discussed physical therapy as first-line treatment. - discussed referral to physical therapy at [facility] on [address]. - Monitor symptoms, consider arthroscopy if pain persists.

## 2024-03-07 NOTE — Assessment & Plan Note (Signed)
 Chronic tremor managed with primidone . Discussed high-intensity focused ultrasound as non-surgical option. He is open to exploring candidacy. - Provided contact information for [facility] to assess candidacy for high-intensity focused ultrasound. - Continue current primidone  regimen.

## 2024-03-30 ENCOUNTER — Ambulatory Visit (HOSPITAL_COMMUNITY)
Admission: RE | Admit: 2024-03-30 | Discharge: 2024-03-30 | Disposition: A | Source: Ambulatory Visit | Attending: Hematology and Oncology

## 2024-03-30 ENCOUNTER — Encounter (HOSPITAL_COMMUNITY): Payer: Self-pay

## 2024-03-30 DIAGNOSIS — C61 Malignant neoplasm of prostate: Secondary | ICD-10-CM

## 2024-03-30 MED ORDER — IOHEXOL 300 MG/ML  SOLN
100.0000 mL | Freq: Once | INTRAMUSCULAR | Status: AC | PRN
  Administered 2024-03-30: 100 mL via INTRAVENOUS

## 2024-03-31 ENCOUNTER — Ambulatory Visit: Payer: Medicare Other

## 2024-03-31 DIAGNOSIS — Z0000 Encounter for general adult medical examination without abnormal findings: Secondary | ICD-10-CM

## 2024-03-31 NOTE — Progress Notes (Signed)
 "  No chief complaint on file.    Subjective:   [name] is a 79 y.o. male who presents for a Medicare Annual Wellness Visit.  Visit info / Clinical Intake: Medicare Wellness Visit Type:: Subsequent Annual Wellness Visit Persons participating in visit and providing information:: patient Medicare Wellness Visit Mode:: Video Since this visit was completed virtually, some vitals may be partially provided or unavailable. Missing vitals are due to the limitations of the virtual format.: Unable to obtain vitals - no equipment If Telephone or Video please confirm:: I connected with patient using audio/video enable telemedicine. I verified patient identity with two identifiers, discussed telehealth limitations, and patient agreed to proceed. Patient Location:: home Provider Location:: home Interpreter Needed?: No Pre-visit prep was completed: yes AWV questionnaire completed by patient prior to visit?: yes Date:: 03/29/24 Living arrangements:: lives with spouse/significant other Patient's Overall Health Status Rating: good Typical amount of pain: some Does pain affect daily life?: (!) yes Are you currently prescribed opioids?: no  Dietary Habits and Nutritional Risks How many meals a day?: 3 Eats fruit and vegetables daily?: yes Most meals are obtained by: preparing own meals In the last 2 weeks, have you had any of the following?: none Diabetic:: no  Functional Status Activities of Daily Living (to include ambulation/medication): Independent Ambulation: Independent Medication Administration: Independent Home Management (perform basic housework or laundry): Independent Manage your own finances?: yes Primary transportation is: driving Concerns about vision?: no *vision screening is required for WTM* Concerns about hearing?: no  Fall Screening Falls in the past year?: 0 Number of falls in past year: 0 Was there an injury with Fall?: 0 Fall Risk Category Calculator: 0 Patient  Fall Risk Level: Low Fall Risk  Fall Risk Patient at Risk for Falls Due to: No Fall Risks Fall risk Follow up: Falls evaluation completed  Home and Transportation Safety: All rugs have non-skid backing?: yes All stairs or steps have railings?: yes Grab bars in the bathtub or shower?: (!) no Have non-skid surface in bathtub or shower?: (!) no Good home lighting?: yes Regular seat belt use?: yes Hospital stays in the last year:: no  Cognitive Assessment Difficulty concentrating, remembering, or making decisions? : no Will 6CIT or Mini Cog be Completed: yes What year is it?: 0 points What month is it?: 0 points Give patient an address phrase to remember (5 components): [address] [place] About what time is it?: 0 points Count backwards from 20 to 1: 0 points Say the months of the year in reverse: 0 points Repeat the address phrase from earlier: 0 points 6 CIT Score: 0 points  Advance Directives (For Healthcare) Does Patient Have a Medical Advance Directive?: Yes Does patient want to make changes to medical advance directive?: No - Patient declined Type of Advance Directive: Healthcare Power of [place]; Living will Copy of Healthcare Power of Attorney in Chart?: No - copy requested Copy of Living Will in Chart?: No - copy requested  Reviewed/Updated  Reviewed/Updated: Reviewed All (Medical, Surgical, Family, Medications, Allergies, Care Teams, Patient Goals)    Allergies (verified) Imipramine, Lisinopril -hydrochlorothiazide , Meloxicam, and Tape   Current Medications (verified) Outpatient Encounter Medications as of 03/31/2024  Medication Sig   acetaminophen  (TYLENOL ) 325 MG tablet Take 325 mg by mouth every 6 (six) hours as needed for mild pain, moderate pain, fever or headache.   LORazepam  (ATIVAN ) 0.5 MG tablet TAKE 1 TABLET BY MOUTH DAILY AS NEEDED FOR ANXIETY   losartan  (COZAAR ) 25 MG tablet Take  0.5 tablets (12.5 mg total) by mouth daily. IF BLOOD PRESSURE  INCREASES THROUGH THE DAY YOU MAY TAKE THE OTHER HALF TABLET.   magnesium  (MAGTAB) 84 MG ( ) TBCR SR tablet Take 84 mg by mouth.   Multiple Vitamins-Minerals (CENTRUM SILVER 50+MEN) TABS Take by mouth.   pregabalin (LYRICA) 100 MG capsule Take 100 mg by mouth 3 (three) times daily.   primidone  (MYSOLINE ) 250 MG tablet Take 1 tablet (250 mg total) by mouth every morning. (Patient taking differently: Take 250 mg by mouth every morning. 100 in the morning and 250 in the pm)   rosuvastatin  (CRESTOR ) 10 MG tablet Take 1 tablet (10 mg total) by mouth daily.   zolpidem  (AMBIEN  CR) 12.5 MG CR tablet Take 1 tablet (12.5 mg total) by mouth at bedtime as needed. for sleep   primidone  (MYSOLINE ) 50 MG tablet TAKE 5 TABLETS BY MOUTH EVERY MORNING AND 1 TAKE IN THE EVENING (Patient taking differently: Take 50 mg by mouth at bedtime. Per patient)   No facility-administered encounter medications on file as of 03/31/2024.    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   Hyperlipidemia    Hypertension    Neuromuscular disorder (HCC)    Prostate cancer (HCC)    Recurrent upper respiratory infection (URI)    pt had a cold recent- week ago - better now    Tremor    Past Surgical History:  Procedure Laterality Date   CATARACT EXTRACTION Bilateral    LUMBAR LAMINECTOMY/DECOMPRESSION MICRODISCECTOMY Left 04/01/2018   Procedure: Left Lumbar four-lumbar five Microdiscectomy;  Surgeon: Unice Pac, MD;  Location: [facility] OR;  Service: Neurosurgery;  Laterality: Left;   OTHER SURGICAL HISTORY  2005   right hand middle finger surgery    REPAIR OF CEREBROSPINAL FLUID LEAK N/A 05/18/2018   Procedure: Exploration of Lumbar four-five with repair of cerebrospinal fluid leak;  Surgeon: Unice Pac, MD;  Location: [facility] OR;  Service: Neurosurgery;  Laterality: N/A;   ROBOT ASSISTED LAPAROSCOPIC RADICAL  PROSTATECTOMY  01/13/2011   Procedure: ROBOTIC ASSISTED LAPAROSCOPIC RADICAL PROSTATECTOMY LEVEL 2;  Surgeon: Noretta Ferrara, MD;  Location: [facility] ORS;  Service: Urology;  Laterality: Bilateral;  Robotic Assisted Laparoscopic Prostatetectomy with Bilateral Lymphadenectomy  Level 2   SPINE SURGERY     Family History  Problem Relation Age of Onset   Heart attack Father    Hypertension Father    Cancer Mother        cervical with mets to lung   Hypertension Sister    Healthy Child    Social History   Occupational History   Occupation: Retired    Comment: art gallery manager  Tobacco Use   Smoking status: Never   Smokeless tobacco: Never  Vaping Use   Vaping status: Never Used  Substance and Sexual Activity   Alcohol use: Not Currently    Comment: 1 drink per day or less   Drug use: Never   Sexual activity: Not Currently   Tobacco Counseling Counseling given: Not Answered  SDOH Screenings   Food Insecurity: No Food Insecurity (03/31/2024)  Housing: Low Risk (03/31/2024)  Transportation Needs: No Transportation Needs (03/31/2024)  Utilities: Not At Risk (03/31/2024)  Alcohol Screen: Low Risk (03/10/2023)  Depression (PHQ2-9): Low Risk (03/31/2024)  Financial Resource Strain: Low Risk (03/10/2023)  Physical Activity: Insufficiently Active (03/31/2024)  Social  Connections: Moderately Integrated (03/31/2024)  Stress: No Stress Concern Present (03/31/2024)  Tobacco Use: Low Risk (03/07/2024)  Health Literacy: Adequate Health Literacy (03/31/2024)   See flowsheets for full screening details  Depression Screen PHQ 2 & 9 Depression Scale- Over the past 2 weeks, how often have you been bothered by any of the following problems? Little interest or pleasure in doing things: 0 Feeling down, depressed, or hopeless (PHQ Adolescent also includes...irritable): 0 PHQ-2 Total Score: 0 Trouble falling or staying asleep, or sleeping too much: 1 Feeling tired or having little energy: 0 Poor appetite or overeating (PHQ  Adolescent also includes...weight loss): 0 Feeling bad about yourself - or that you are a failure or have let yourself or your family down: 0 Trouble concentrating on things, such as reading the newspaper or watching television (PHQ Adolescent also includes...like school work): 0 Moving or speaking so slowly that other people could have noticed. Or the opposite - being so fidgety or restless that you have been moving around a lot more than usual: 1 Thoughts that you would be better off dead, or of hurting yourself in some way: 0 PHQ-9 Total Score: 2 If you checked off any problems, how difficult have these problems made it for you to do your work, take care of things at home, or get along with other people?: Somewhat difficult     Goals Addressed             This Visit's Progress    Trying to get fruits and veggies in. Monitors BP daily.               Objective:    There were no vitals filed for this visit. There is no height or weight on file to calculate BMI.  Hearing/Vision screen No results found. Immunizations and Health Maintenance Health Maintenance  Topic Date Due   Hepatitis C Screening  Never done   COVID-19 Vaccine (5 - 2025-26 season) 10/26/2023   Medicare Annual Wellness (AWV)  03/09/2024   DTaP/Tdap/Td (4 - Td or Tdap) 12/12/2029   Pneumococcal Vaccine: 50+ Years  Completed   Influenza Vaccine  Completed   Zoster Vaccines- Shingrix  Completed   Meningococcal B Vaccine  Aged Out   Mammogram  Discontinued   Colonoscopy  Discontinued        Assessment/Plan:  This is a routine wellness examination for [place].  Patient Care Team: Chandra Toribio POUR, MD as PCP - General (Family Medicine) Okey Vina GAILS, MD as PCP - Cardiology (Cardiology) Tat, Asberry RAMAN, DO as Consulting Physician (Neurology) Parthenia Olivia HERO, PA-C as Physician Assistant (Cardiology)  I have personally reviewed and noted the following in the patients chart:   Medical and social  history Use of alcohol, tobacco or illicit drugs  Current medications and supplements including opioid prescriptions. Functional ability and status Nutritional status Physical activity Advanced directives List of other physicians Hospitalizations, surgeries, and ER visits in previous 12 months Vitals Screenings to include cognitive, depression, and falls Referrals and appointments  No orders of the defined types were placed in this encounter.  In addition, I have reviewed and discussed with patient certain preventive protocols, quality metrics, and best practice recommendations. A written personalized care plan for preventive services as well as general preventive health recommendations were provided to patient.   Arnette LOISE Hoots, CMA   03/31/2024   No follow-ups on file.  After Visit Summary: (Declined) Due to this being a telephonic visit, with patients personalized plan was offered  to patient but patient Declined AVS at this time   Nurse Notes: Patient is doing well. He is having complete labs next week. He would like you to look at results. Had last covid vaccine 11/2023. "

## 2024-03-31 NOTE — Patient Instructions (Signed)
 [name],  Thank you for taking the time for your Medicare Wellness Visit. I appreciate your continued commitment to your health goals. Please review the care plan we discussed, and feel free to reach out if I can assist you further.  Please note that Annual Wellness Visits do not include a physical exam. Some assessments may be limited, especially if the visit was conducted virtually. If needed, we may recommend an in-person follow-up with your provider.  Ongoing Care Seeing your primary care provider every 3 to 6 months helps us  monitor your health and provide consistent, personalized care. 7/13@ 1010  Referrals If a referral was made during today's visit and you haven't received any updates within two weeks, please contact the referred provider directly to check on the status.  Recommended Screenings:  Health Maintenance  Topic Date Due   Hepatitis C Screening  Never done   COVID-19 Vaccine (5 - 2025-26 season) 10/26/2023   Medicare Annual Wellness Visit  03/09/2024   DTaP/Tdap/Td vaccine (4 - Td or Tdap) 12/12/2029   Pneumococcal Vaccine for age over 31  Completed   Flu Shot  Completed   Zoster (Shingles) Vaccine  Completed   Meningitis B Vaccine  Aged Out   Breast Cancer Screening  Discontinued   Colon Cancer Screening  Discontinued       03/31/2024   12:12 PM  Advanced Directives  Does Patient Have a Medical Advance Directive? Yes  Type of Estate Agent of [place];Living will  Does patient want to make changes to medical advance directive? No - Patient declined  Copy of Healthcare Power of Attorney in Chart? No - copy requested    Vision: Annual vision screenings are recommended for early detection of glaucoma, cataracts, and diabetic retinopathy. These exams can also reveal signs of chronic conditions such as diabetes and high blood pressure.  Dental: Annual dental screenings help detect early signs of oral cancer, gum disease, and other conditions  linked to overall health, including heart disease and diabetes.  Please see the attached documents for additional preventive care recommendations.

## 2024-04-08 ENCOUNTER — Inpatient Hospital Stay: Payer: Medicare Other | Admitting: Hematology and Oncology

## 2024-04-08 ENCOUNTER — Inpatient Hospital Stay: Payer: Medicare Other

## 2024-08-29 ENCOUNTER — Other Ambulatory Visit

## 2024-09-05 ENCOUNTER — Ambulatory Visit: Admitting: Family Medicine

## 2025-04-13 ENCOUNTER — Ambulatory Visit
